# Patient Record
Sex: Male | Born: 1935 | Race: White | Hispanic: No | Marital: Married | Smoking: Former smoker
Health system: Southern US, Community
[De-identification: ages and names within clinical notes are randomized; demographics above are authoritative.]

## PROBLEM LIST (undated history)

## (undated) DIAGNOSIS — C801 Malignant (primary) neoplasm, unspecified: Secondary | ICD-10-CM

## (undated) DIAGNOSIS — D649 Anemia, unspecified: Secondary | ICD-10-CM

## (undated) DIAGNOSIS — A048 Other specified bacterial intestinal infections: Secondary | ICD-10-CM

## (undated) DIAGNOSIS — K922 Gastrointestinal hemorrhage, unspecified: Secondary | ICD-10-CM

## (undated) DIAGNOSIS — I10 Essential (primary) hypertension: Secondary | ICD-10-CM

## (undated) DIAGNOSIS — S065X9A Traumatic subdural hemorrhage with loss of consciousness of unspecified duration, initial encounter: Secondary | ICD-10-CM

## (undated) DIAGNOSIS — I639 Cerebral infarction, unspecified: Secondary | ICD-10-CM

## (undated) DIAGNOSIS — I629 Nontraumatic intracranial hemorrhage, unspecified: Secondary | ICD-10-CM

## (undated) DIAGNOSIS — S065XAA Traumatic subdural hemorrhage with loss of consciousness status unknown, initial encounter: Secondary | ICD-10-CM

## (undated) DIAGNOSIS — E119 Type 2 diabetes mellitus without complications: Secondary | ICD-10-CM

## (undated) DIAGNOSIS — I482 Chronic atrial fibrillation, unspecified: Secondary | ICD-10-CM

## (undated) DIAGNOSIS — Z9889 Other specified postprocedural states: Secondary | ICD-10-CM

## (undated) DIAGNOSIS — C61 Malignant neoplasm of prostate: Secondary | ICD-10-CM

## (undated) DIAGNOSIS — I2510 Atherosclerotic heart disease of native coronary artery without angina pectoris: Secondary | ICD-10-CM

## (undated) DIAGNOSIS — G250 Essential tremor: Secondary | ICD-10-CM

## (undated) DIAGNOSIS — I4510 Unspecified right bundle-branch block: Secondary | ICD-10-CM

## (undated) HISTORY — DX: Cerebral infarction, unspecified: I63.9

## (undated) HISTORY — PX: CORONARY ARTERY BYPASS GRAFT: SHX141

## (undated) HISTORY — PX: OTHER SURGICAL HISTORY: SHX169

---

## 2005-09-14 ENCOUNTER — Ambulatory Visit: Payer: Self-pay | Admitting: Ophthalmology

## 2005-09-19 ENCOUNTER — Ambulatory Visit: Payer: Self-pay | Admitting: Ophthalmology

## 2006-06-03 ENCOUNTER — Ambulatory Visit: Payer: Self-pay | Admitting: Ophthalmology

## 2006-06-10 ENCOUNTER — Ambulatory Visit: Payer: Self-pay | Admitting: Ophthalmology

## 2009-06-22 ENCOUNTER — Ambulatory Visit: Payer: Self-pay | Admitting: Cardiovascular Disease

## 2009-06-22 ENCOUNTER — Ambulatory Visit: Payer: Self-pay | Admitting: Ophthalmology

## 2009-07-04 ENCOUNTER — Ambulatory Visit: Payer: Self-pay | Admitting: Ophthalmology

## 2011-06-08 ENCOUNTER — Other Ambulatory Visit: Payer: Self-pay | Admitting: Neurosurgery

## 2011-06-08 DIAGNOSIS — S0190XA Unspecified open wound of unspecified part of head, initial encounter: Secondary | ICD-10-CM

## 2011-06-13 ENCOUNTER — Ambulatory Visit
Admission: RE | Admit: 2011-06-13 | Discharge: 2011-06-13 | Disposition: A | Discharge status: Home / Self Care | Payer: Medicare Other | Source: Ambulatory Visit | Attending: Neurosurgery | Admitting: Neurosurgery

## 2011-06-13 DIAGNOSIS — S0190XA Unspecified open wound of unspecified part of head, initial encounter: Secondary | ICD-10-CM

## 2013-12-07 ENCOUNTER — Emergency Department: Payer: Self-pay | Admitting: Emergency Medicine

## 2014-12-22 ENCOUNTER — Emergency Department: Payer: Self-pay | Admitting: Emergency Medicine

## 2014-12-22 LAB — CBC WITH DIFFERENTIAL/PLATELET
Basophil #: 0.1 10*3/uL (ref 0.0–0.1)
Basophil %: 0.5 %
EOS PCT: 0.7 %
Eosinophil #: 0.1 10*3/uL (ref 0.0–0.7)
HCT: 46.1 % (ref 40.0–52.0)
HGB: 15.2 g/dL (ref 13.0–18.0)
LYMPHS ABS: 1.6 10*3/uL (ref 1.0–3.6)
LYMPHS PCT: 14 %
MCH: 31.8 pg (ref 26.0–34.0)
MCHC: 33 g/dL (ref 32.0–36.0)
MCV: 96 fL (ref 80–100)
Monocyte #: 1.1 x10 3/mm — ABNORMAL HIGH (ref 0.2–1.0)
Monocyte %: 9.4 %
NEUTROS PCT: 75.4 %
Neutrophil #: 8.7 10*3/uL — ABNORMAL HIGH (ref 1.4–6.5)
Platelet: 194 10*3/uL (ref 150–440)
RBC: 4.78 10*6/uL (ref 4.40–5.90)
RDW: 14 % (ref 11.5–14.5)
WBC: 11.6 10*3/uL — ABNORMAL HIGH (ref 3.8–10.6)

## 2014-12-22 LAB — URINALYSIS, COMPLETE
BACTERIA: NONE SEEN
Bilirubin,UR: NEGATIVE
Blood: NEGATIVE
Glucose,UR: NEGATIVE mg/dL (ref 0–75)
Ketone: NEGATIVE
LEUKOCYTE ESTERASE: NEGATIVE
Nitrite: NEGATIVE
PROTEIN: NEGATIVE
Ph: 5 (ref 4.5–8.0)
RBC,UR: 1 /HPF (ref 0–5)
SPECIFIC GRAVITY: 1.017 (ref 1.003–1.030)
Squamous Epithelial: <1
WBC UR: 2 /HPF (ref 0–5)

## 2014-12-22 LAB — BASIC METABOLIC PANEL
Anion Gap: 7 (ref 7–16)
BUN: 20 mg/dL — ABNORMAL HIGH (ref 7–18)
CO2: 27 mmol/L (ref 21–32)
Calcium, Total: 8.8 mg/dL (ref 8.5–10.1)
Chloride: 108 mmol/L — ABNORMAL HIGH (ref 98–107)
Creatinine: 1.13 mg/dL (ref 0.60–1.30)
Glucose: 119 mg/dL — ABNORMAL HIGH (ref 65–99)
Osmolality: 287 (ref 275–301)
Potassium: 4.3 mmol/L (ref 3.5–5.1)
SODIUM: 142 mmol/L (ref 136–145)

## 2014-12-22 LAB — TROPONIN I: TROPONIN-I: 0.02 ng/mL

## 2015-01-13 ENCOUNTER — Ambulatory Visit: Payer: Self-pay | Admitting: Neurology

## 2015-07-09 ENCOUNTER — Ambulatory Visit: Admission: RE | Admit: 2015-07-09 | Payer: Self-pay | Source: Ambulatory Visit

## 2015-07-09 ENCOUNTER — Emergency Department
Admission: EM | Admit: 2015-07-09 | Discharge: 2015-07-09 | Discharge status: Against Medical Advice | Payer: Medicare Other | Attending: Emergency Medicine | Admitting: Emergency Medicine

## 2015-07-09 ENCOUNTER — Encounter: Payer: Self-pay | Admitting: Emergency Medicine

## 2015-07-09 DIAGNOSIS — W1839XA Other fall on same level, initial encounter: Secondary | ICD-10-CM | POA: Diagnosis not present

## 2015-07-09 DIAGNOSIS — S59901A Unspecified injury of right elbow, initial encounter: Secondary | ICD-10-CM | POA: Diagnosis not present

## 2015-07-09 DIAGNOSIS — Y92009 Unspecified place in unspecified non-institutional (private) residence as the place of occurrence of the external cause: Secondary | ICD-10-CM | POA: Insufficient documentation

## 2015-07-09 DIAGNOSIS — Y9389 Activity, other specified: Secondary | ICD-10-CM | POA: Insufficient documentation

## 2015-07-09 DIAGNOSIS — Y998 Other external cause status: Secondary | ICD-10-CM | POA: Insufficient documentation

## 2015-07-09 DIAGNOSIS — I10 Essential (primary) hypertension: Secondary | ICD-10-CM | POA: Insufficient documentation

## 2015-07-09 DIAGNOSIS — Z87891 Personal history of nicotine dependence: Secondary | ICD-10-CM | POA: Insufficient documentation

## 2015-07-09 HISTORY — DX: Essential (primary) hypertension: I10

## 2015-07-09 HISTORY — DX: Type 2 diabetes mellitus without complications: E11.9

## 2015-07-09 HISTORY — DX: Essential tremor: G25.0

## 2015-07-09 HISTORY — DX: Nontraumatic intracranial hemorrhage, unspecified: I62.9

## 2015-07-09 NOTE — ED Notes (Signed)
Caregiver is with patient now. States patient has follow up with neurologist on Monday and opts to leave now and see that MD on Monday.

## 2015-07-09 NOTE — ED Notes (Signed)
Patient states he feels like his "feet get stuck"

## 2015-07-09 NOTE — ED Notes (Signed)
States has history of tremor and unsteady gait x 6 months, 4 falls in past 2 weeks

## 2015-07-13 ENCOUNTER — Other Ambulatory Visit: Payer: Self-pay | Admitting: Neurology

## 2015-07-13 DIAGNOSIS — W19XXXA Unspecified fall, initial encounter: Secondary | ICD-10-CM

## 2015-07-19 ENCOUNTER — Ambulatory Visit
Admission: RE | Admit: 2015-07-19 | Discharge: 2015-07-19 | Disposition: A | Discharge status: Home / Self Care | Payer: Medicare Other | Source: Ambulatory Visit | Attending: Neurology | Admitting: Neurology

## 2015-07-19 DIAGNOSIS — G319 Degenerative disease of nervous system, unspecified: Secondary | ICD-10-CM | POA: Insufficient documentation

## 2015-07-19 DIAGNOSIS — S0990XA Unspecified injury of head, initial encounter: Secondary | ICD-10-CM | POA: Insufficient documentation

## 2015-07-19 DIAGNOSIS — G9389 Other specified disorders of brain: Secondary | ICD-10-CM | POA: Diagnosis not present

## 2015-07-19 DIAGNOSIS — R296 Repeated falls: Secondary | ICD-10-CM | POA: Diagnosis present

## 2015-07-19 DIAGNOSIS — W19XXXA Unspecified fall, initial encounter: Secondary | ICD-10-CM

## 2015-08-15 ENCOUNTER — Other Ambulatory Visit: Payer: Self-pay | Admitting: Neurology

## 2015-08-15 DIAGNOSIS — R1310 Dysphagia, unspecified: Secondary | ICD-10-CM

## 2015-08-29 ENCOUNTER — Ambulatory Visit
Admission: RE | Admit: 2015-08-29 | Discharge: 2015-08-29 | Disposition: A | Discharge status: Home / Self Care | Payer: Medicare Other | Source: Ambulatory Visit | Attending: Neurology | Admitting: Neurology

## 2015-08-29 DIAGNOSIS — R1310 Dysphagia, unspecified: Secondary | ICD-10-CM | POA: Diagnosis present

## 2015-08-29 DIAGNOSIS — E119 Type 2 diabetes mellitus without complications: Secondary | ICD-10-CM | POA: Diagnosis not present

## 2015-08-29 DIAGNOSIS — I10 Essential (primary) hypertension: Secondary | ICD-10-CM | POA: Insufficient documentation

## 2015-08-29 NOTE — Therapy (Signed)
[place], [place], [address] Phone: [phone]   Fax:     Modified Barium Swallow  Patient Details  Name: Robert Keith MRN: [id number] Date of Birth: Jul 22, 1933 Referring Provider:  Vladimir Crofts, MD  Encounter Date: 08/29/2015      End of Session - 08/29/15 1335    Visit Number 1   Number of Visits 1   Date for SLP Re-Evaluation 08/29/15   SLP Start Time 1245   SLP Stop Time  1335   SLP Time Calculation (min) 50 min   Activity Tolerance Patient tolerated treatment well      Past Medical History  Diagnosis Date  . Essential tremor   . Intracranial hemorrhage   . Diabetes mellitus without complication   . Hypertension     Past Surgical History  Procedure Laterality Date  . Brain surgery for bleed      There were no vitals filed for this visit.  Visit Diagnosis: Dysphagia - Plan: DG OP Swallowing Func-Medicare/Speech Path, DG OP Swallowing Func-Medicare/Speech Path     Subjective: Patient behavior: Patient is alert and able to follow directions for this evaluation.  Chief complaint:  The patient reports that "sometimes my throat just closes" and occasional episodes of liquid "going the wrong way".   Objective:  Radiological Procedure: A videoflouroscopic evaluation of oral-preparatory, reflex initiation, and pharyngeal phases of the swallow was performed; as well as a screening of the upper esophageal phase.  I. POSTURE: Upright in MBS chair  II. VIEW: Lateral  III. COMPENSATORY STRATEGIES: N/A  IV. BOLUSES ADMINISTERED:   Thin Liquid: 2 sips, 3 rapid multiple sips   Nectar-thick Liquid: 1 sip    Puree: 2 teaspoons   Mechanical Soft:1/4 graham cracker in applesauce  V. RESULTS OF EVALUATION: A. ORAL PREPARATORY PHASE: (The lips, tongue, and velum are observed for strength and coordination)  Overall within functional limits; observe some xerostomia with barium  adhering to dry mucosal surfaces.       **Overall Severity Rating: WFL  B. SWALLOW INITIATION/REFLEX: (The reflex is normal if "triggered" by the time the bolus reached the base of the tongue) Triggers at the vallecular spaces.  **Overall Severity Rating: Minimal   C. PHARYNGEAL PHASE: (Pharyngeal function is normal if the bolus shows rapid, smooth, and continuous transit through the pharynx and there is no pharyngeal residue after the swallow) Within normal limits  **Overall Severity Rating:WNL  D. LARYNGEAL PENETRATION: (Material entering into the laryngeal inlet/vestibule but not aspirated) None  E. ASPIRATION: None  F. ESOPHAGEAL PHASE: (Screening of the upper esophagus) No observed abnormalities through the cervical esophagus.   ASSESSMENT: This 81 year old man; with essential tremor (possibly PSP) and swallowing difficultly described as his throat closing up and occasional choking if he does not attend to swallowing liquids; is presenting with minimal oropharyngeal dysphagia.  Oral control of the bolus including oral hold, rotary mastication, and anterior to posterior transfer is within normal limits.  Timing of the pharyngeal swallow is minimally delayed.  Pharyngeal aspects of the swallow including hyolaryngeal excursion, pharyngeal pressure generation, duration/amplitude of UES opening, and laryngeal vestibule closure at the height of the swallow are within normal limits.  There is no observed laryngeal penetration or tracheal aspiration.  The patient is not at risk for prandial aspiration.  The "throat closing" episodes sound as if he may be experiencing laryngospasms.  Recommend referral to ENT to fully evaluate vocal fold function and  assess for laryngopharyngeal reflux.    PLAN/RECOMMENDATIONS:   A. Diet: Regular   B. Swallowing Precautions: Reflux precautions   C. Recommended consultation to ENT   D. Therapy recommendations N/A   E. Results and recommendations were discussed  with the patient immediately following the study                         and the final report will be faxed to referring MD.             G-Codes - 2015/09/23 1336    Functional Assessment Tool Used MBS   Functional Limitations Swallowing   Swallow Current Status (X6147) At least 1 percent but less than 20 percent impaired, limited or restricted   Swallow Goal Status (W9295) At least 1 percent but less than 20 percent impaired, limited or restricted   Swallow Discharge Status [id number]) At least 1 percent but less than 20 percent impaired, limited or restricted          Problem List There are no active problems to display for this patient.  Leroy Sea, MS/CCC- SLP  Lou Miner 09-23-15, 1:37 PM  [facility] [place], [place], [address] Phone: [phone]   Fax:

## 2016-03-30 ENCOUNTER — Emergency Department
Admission: EM | Admit: 2016-03-30 | Discharge: 2016-03-30 | Disposition: A | Discharge status: Home / Self Care | Payer: Medicare Other | Attending: Emergency Medicine | Admitting: Emergency Medicine

## 2016-03-30 ENCOUNTER — Encounter: Payer: Self-pay | Admitting: Emergency Medicine

## 2016-03-30 DIAGNOSIS — I629 Nontraumatic intracranial hemorrhage, unspecified: Secondary | ICD-10-CM | POA: Insufficient documentation

## 2016-03-30 DIAGNOSIS — E119 Type 2 diabetes mellitus without complications: Secondary | ICD-10-CM | POA: Diagnosis not present

## 2016-03-30 DIAGNOSIS — L089 Local infection of the skin and subcutaneous tissue, unspecified: Secondary | ICD-10-CM | POA: Insufficient documentation

## 2016-03-30 DIAGNOSIS — I10 Essential (primary) hypertension: Secondary | ICD-10-CM | POA: Insufficient documentation

## 2016-03-30 DIAGNOSIS — Z87891 Personal history of nicotine dependence: Secondary | ICD-10-CM | POA: Diagnosis not present

## 2016-03-30 DIAGNOSIS — B958 Unspecified staphylococcus as the cause of diseases classified elsewhere: Secondary | ICD-10-CM

## 2016-03-30 DIAGNOSIS — L98499 Non-pressure chronic ulcer of skin of other sites with unspecified severity: Secondary | ICD-10-CM | POA: Diagnosis present

## 2016-03-30 MED ORDER — CEPHALEXIN 500 MG PO CAPS: 500.0000 mg | ORAL_CAPSULE | Freq: Two times a day (BID) | ORAL | Status: AC

## 2016-03-30 NOTE — ED Notes (Signed)
Redness and drainage from umbilical area.  Patient describes a crust around umbilicus x 2 days.  Inner aspect of umbilical area red with small amount of yellow drainage seen.

## 2016-03-30 NOTE — ED Provider Notes (Signed)
CSN: [id number]     Arrival date & time 03/30/16  1826 History   First MD Initiated Contact with Patient 03/30/16 1857     Chief Complaint  Patient presents with  . Skin Ulcer     HPI  82 year old male who presents to the emergency department for evaluation of a crusting in discharge from his umbilicus for the past 2 days. He states that the inner aspect of the MP local area has been red for a few days, but the drainage started 2 days ago. He denies pain. He states that his caretaker has cleaned it out well, but otherwise he has had no treatment.  Past Medical History  Diagnosis Date  . Essential tremor   . Intracranial hemorrhage ([place])   . Diabetes mellitus without complication ([place])   . Hypertension    Past Surgical History  Procedure Laterality Date  . Brain surgery for bleed     No family history on file. Social History  Substance Use Topics  . Smoking status: Former Research scientist (life sciences)  . Smokeless tobacco: None  . Alcohol Use: No    Review of Systems  Constitutional: Negative for fever and chills.  Respiratory: Negative.   Gastrointestinal: Negative for nausea, vomiting and abdominal pain.  Skin: Positive for color change.      Allergies  Phenobarbital  Home Medications   Prior to Admission medications   Medication Sig Start Date End Date Taking? Authorizing Provider  cephALEXin (KEFLEX) 500 MG capsule Take 1 capsule (500 mg total) by mouth 2 (two) times daily. 03/30/16 04/09/16  Shatera Rennert B Jaileen Janelle, FNP   BP 130/62 mmHg  Pulse 65  Temp(Src) 98.1 F (36.7 C) (Oral)  Ht 6' (1.829 m)  Wt 83.915 kg  BMI 25.08 kg/m2  SpO2 97% Physical Exam  Constitutional: He is oriented to person, place, and time. He appears well-developed and well-nourished.  Eyes: EOM are normal.  Neck: Normal range of motion.  Pulmonary/Chest: Effort normal.  Abdominal: Soft. He exhibits no distension and no mass. There is no tenderness.  Musculoskeletal: Normal range of motion.  Neurological: He is  alert and oriented to person, place, and time.  Skin: Skin is warm. There is erythema.     Psychiatric: He has a normal mood and affect. His behavior is normal. Judgment and thought content normal.  Nursing note and vitals reviewed.   ED Course  Procedures (including critical care time) Labs Review Labs Reviewed - No data to display  Imaging Review No results found. I have personally reviewed and evaluated these images and lab results as part of my medical decision-making.   EKG Interpretation None      MDM   Final diagnoses:  Staph skin infection    Patient will be started on Keflex twice per day for the next 10 days. He was instructed to schedule a follow-up appointment with his primary care provider if the symptoms are not improving over the next 3 days. He was advised to continue wound care and cleansing the umbilicus twice per day. He was advised to return to the emergency department for symptoms that change or worsen if he is unable to schedule an appointment with his primary care provider.    Victorino Dike, FNP 03/30/16 [place], MD 04/03/16 [phone]

## 2016-09-16 ENCOUNTER — Emergency Department: Payer: Medicare Other

## 2016-09-16 ENCOUNTER — Other Ambulatory Visit: Payer: Self-pay

## 2016-09-16 ENCOUNTER — Encounter: Payer: Self-pay | Admitting: Emergency Medicine

## 2016-09-16 ENCOUNTER — Emergency Department
Admission: EM | Admit: 2016-09-16 | Discharge: 2016-09-16 | Payer: Medicare Other | Attending: Student in an Organized Health Care Education/Training Program | Admitting: Student in an Organized Health Care Education/Training Program

## 2016-09-16 DIAGNOSIS — E119 Type 2 diabetes mellitus without complications: Secondary | ICD-10-CM | POA: Diagnosis not present

## 2016-09-16 DIAGNOSIS — K254 Chronic or unspecified gastric ulcer with hemorrhage: Secondary | ICD-10-CM

## 2016-09-16 DIAGNOSIS — I10 Essential (primary) hypertension: Secondary | ICD-10-CM | POA: Diagnosis not present

## 2016-09-16 DIAGNOSIS — Z87891 Personal history of nicotine dependence: Secondary | ICD-10-CM | POA: Diagnosis not present

## 2016-09-16 DIAGNOSIS — K922 Gastrointestinal hemorrhage, unspecified: Secondary | ICD-10-CM | POA: Diagnosis not present

## 2016-09-16 DIAGNOSIS — R1013 Epigastric pain: Secondary | ICD-10-CM

## 2016-09-16 HISTORY — DX: Gastrointestinal hemorrhage, unspecified: K92.2

## 2016-09-16 HISTORY — DX: Malignant (primary) neoplasm, unspecified: C80.1

## 2016-09-16 HISTORY — DX: Other specified bacterial intestinal infections: A04.8

## 2016-09-16 LAB — CBC
HCT: 32.2 % — ABNORMAL LOW (ref 40.0–52.0)
Hemoglobin: 10.9 g/dL — ABNORMAL LOW (ref 13.0–18.0)
MCH: 32.2 pg (ref 26.0–34.0)
MCHC: 33.7 g/dL (ref 32.0–36.0)
MCV: 95.5 fL (ref 80.0–100.0)
PLATELETS: 217 10*3/uL (ref 150–440)
RBC: 3.38 MIL/uL — AB (ref 4.40–5.90)
RDW: 13.3 % (ref 11.5–14.5)
WBC: 7.7 10*3/uL (ref 3.8–10.6)

## 2016-09-16 LAB — COMPREHENSIVE METABOLIC PANEL
ALBUMIN: 4.2 g/dL (ref 3.5–5.0)
ALK PHOS: 89 U/L (ref 38–126)
ALT: 46 U/L (ref 17–63)
AST: 52 U/L — AB (ref 15–41)
Anion gap: 7 (ref 5–15)
BUN: 14 mg/dL (ref 6–20)
CALCIUM: 8.6 mg/dL — AB (ref 8.9–10.3)
CHLORIDE: 107 mmol/L (ref 101–111)
CO2: 26 mmol/L (ref 22–32)
CREATININE: 0.76 mg/dL (ref 0.61–1.24)
GFR calc non Af Amer: >60 mL/min (ref >60)
GLUCOSE: 172 mg/dL — AB (ref 65–99)
Potassium: 3.7 mmol/L (ref 3.5–5.1)
SODIUM: 140 mmol/L (ref 135–145)
Total Bilirubin: 0.5 mg/dL (ref 0.3–1.2)
Total Protein: 7.3 g/dL (ref 6.5–8.1)

## 2016-09-16 LAB — LIPASE, BLOOD: LIPASE: 23 U/L (ref 11–51)

## 2016-09-16 MED ORDER — PANTOPRAZOLE SODIUM 40 MG IV SOLR
80.0000 mg | Freq: Once | INTRAVENOUS | Status: AC
  Administered 2016-09-16: 80 mg via INTRAVENOUS
  Filled 2016-09-16: qty 80

## 2016-09-16 MED ORDER — GI COCKTAIL ~~LOC~~
30.0000 mL | Freq: Once | ORAL | Status: AC
  Administered 2016-09-16: 30 mL via ORAL
  Filled 2016-09-16: qty 30

## 2016-09-16 MED ORDER — IOPAMIDOL (ISOVUE-300) INJECTION 61%
100.0000 mL | Freq: Once | INTRAVENOUS | Status: AC | PRN
  Administered 2016-09-16: 100 mL via INTRAVENOUS

## 2016-09-16 MED ORDER — IOPAMIDOL (ISOVUE-300) INJECTION 61%
30.0000 mL | Freq: Once | INTRAVENOUS | Status: AC | PRN
  Administered 2016-09-16: 30 mL via ORAL

## 2016-09-16 MED ORDER — METOCLOPRAMIDE HCL 5 MG/ML IJ SOLN
10.0000 mg | Freq: Once | INTRAMUSCULAR | Status: AC
  Administered 2016-09-16: 10 mg via INTRAVENOUS
  Filled 2016-09-16: qty 2

## 2016-09-16 NOTE — ED Triage Notes (Addendum)
Pt presents to ED with mid abdominal pain associated with nausea and diarrhea. Pt states he was constipated then broke into a diarrhea. Pt also reports dark stool but states he started having dark stool he began to take pepto-bismol. Pt also reports something feels like hemorrhoids. Pt alerts and oriented x3 at time. Hx of H. Pylori.

## 2016-09-16 NOTE — Progress Notes (Addendum)
Transfer from [facility]. Robert Keith is a 82 year old male with pmh significant for HTN, DM, PUD, and H. Pylori; who presents with 2 wk hx of epigastric pain. Intermittent Motrin use. Tried Pepto-Bismol without relief. VS stable. Hbg 10.9( last Hbg of note 14.1 in 11/2015) and all other labs relatively unremarkable. Guaiac positive stools. CT abdomen pelvis pending. Given Protonix 80 mg IV, Reglan, GI cocktail, and IV fluids Transferring for need of GI consultative services. Will need to consult GI either upon arrival or in am. Observation status to a telemetry bed.

## 2016-09-16 NOTE — ED Notes (Signed)
In with MD to chaperone rectal exam; pt hemoccult positive; pt tolerated well; repositioned for comfort;

## 2016-09-16 NOTE — ED Provider Notes (Signed)
[facility] Emergency Department Provider Note    First MD Initiated Contact with Patient 09/16/16 2001     (approximate)  I have reviewed the triage vital signs and the nursing notes.   HISTORY  Chief Complaint Abdominal Pain    HPI [name] is a 82 y.o. male 2 weeks of worsening epigastric pain and nausea and dark stools. Patient has been taking Pepto-Bismol but says that the melena has become more foul-smelling. States that the epigastric pain has been worse over the past 24 hours. The patient does have a history of peptic ulcer disease secondary to H. pylori. Denies any recent fevers. Does report shortness of breath and fatigue. Denies any dysuria.   Past Medical History:  Diagnosis Date  . Diabetes mellitus without complication ([place])   . Essential tremor   . Hypertension   . Intracranial hemorrhage ([place])     There are no active problems to display for this patient.   Past Surgical History:  Procedure Laterality Date  . brain surgery for bleed      Prior to Admission medications   Not on File    Allergies Phenobarbital  History reviewed. No pertinent family history.  Social History Social History  Substance Use Topics  . Smoking status: Former Research scientist (life sciences)  . Smokeless tobacco: Never Used  . Alcohol use No    Review of Systems Patient denies headaches, rhinorrhea, blurry vision, numbness, shortness of breath, chest pain, edema, cough, abdominal pain, nausea, vomiting, diarrhea, dysuria, fevers, rashes or hallucinations unless otherwise stated above in HPI. ____________________________________________   PHYSICAL EXAM:  VITAL SIGNS: Vitals:   09/16/16 1916  BP: 131/71  Pulse: 63  Resp: 16  Temp: 98.3 F (36.8 C)    Constitutional: Alert and oriented. Ill appearing but not in no acute distress  Eyes: Conjunctivae are normal. PERRL. EOMI. Head: Atraumatic. Nose: No congestion/rhinnorhea. Mouth/Throat: Mucous membranes  are moist.  Oropharynx non-erythematous. Neck: No stridor. Painless ROM. No cervical spine tenderness to palpation Hematological/Lymphatic/Immunilogical: No cervical lymphadenopathy. Cardiovascular: Normal rate, regular rhythm. Grossly normal heart sounds.  Good peripheral circulation. Respiratory: Normal respiratory effort.  No retractions. Lungs CTAB. Gastrointestinal: Soft with mild epigastric tenderness to palpation. No distention. No abdominal bruits. No CVA tenderness. Rectal exam with nontender hemorrhoids and grossly positive guaiac study. Genitourinary:  Musculoskeletal: No lower extremity tenderness nor edema.  No joint effusions. Neurologic:  Normal speech and language. No gross focal neurologic deficits are appreciated. No gait instability. Skin:  Skin is warm, dry and intact. No rash noted. Psychiatric: Mood and affect are normal. Speech and behavior are normal.  ____________________________________________   LABS (all labs ordered are listed, but only abnormal results are displayed)  Results for orders placed or performed during the hospital encounter of 09/16/16 (from the past 24 hour(s))  Lipase, blood     Status: None   Collection Time: 09/16/16  7:27 PM  Result Value Ref Range   Lipase 23 11 - 51 U/L  Comprehensive metabolic panel     Status: Abnormal   Collection Time: 09/16/16  7:27 PM  Result Value Ref Range   Sodium 140 135 - 145 mmol/L   Potassium 3.7 3.5 - 5.1 mmol/L   Chloride 107 101 - 111 mmol/L   CO2 26 22 - 32 mmol/L   Glucose, Bld 172 (H) 65 - 99 mg/dL   BUN 14 6 - 20 mg/dL   Creatinine, Ser 0.76 0.61 - 1.24 mg/dL   Calcium 8.6 (L) 8.9 -  10.3 mg/dL   Total Protein 7.3 6.5 - 8.1 g/dL   Albumin 4.2 3.5 - 5.0 g/dL   AST 52 (H) 15 - 41 U/L   ALT 46 17 - 63 U/L   Alkaline Phosphatase 89 38 - 126 U/L   Total Bilirubin 0.5 0.3 - 1.2 mg/dL   GFR calc non Af Amer >60 >60 mL/min   GFR calc Af Amer >60 >60 mL/min   Anion gap 7 5 - 15  CBC     Status:  Abnormal   Collection Time: 09/16/16  7:27 PM  Result Value Ref Range   WBC 7.7 3.8 - 10.6 K/uL   RBC 3.38 (L) 4.40 - 5.90 MIL/uL   Hemoglobin 10.9 (L) 13.0 - 18.0 g/dL   HCT 32.2 (L) 40.0 - 52.0 %   MCV 95.5 80.0 - 100.0 fL   MCH 32.2 26.0 - 34.0 pg   MCHC 33.7 32.0 - 36.0 g/dL   RDW 13.3 11.5 - 14.5 %   Platelets 217 150 - 440 K/uL   ____________________________________________  EKG My review and personal interpretation at Time: 20:50   Indication: weakness  Rate: 80  Rhythm: afib Axis:  Other: no acute ischemia ____________________________________________  RADIOLOGY I personally reviewed all radiographic images ordered to evaluate for the above acute complaints and reviewed radiology reports and findings.  These findings were personally discussed with the patient.  Please see medical record for radiology report.  ____________________________________________   PROCEDURES  Procedure(s) performed: none    Critical Care performed: no ____________________________________________   INITIAL IMPRESSION / ASSESSMENT AND PLAN / ED COURSE  Pertinent labs & imaging results that were available during my care of the patient were reviewed by me and considered in my medical decision making (see chart for details).  DDX: Obstruction, GI bleed, gastritis  [name] is a 82 y.o. who presents to the ED with 2 weeks of epigastric pain as well as darkening stools. Patient with history of peptic ulcer disease. Abdominal exam does have some epigastric tenderness. CT imaging ordered to evaluate for acute obstruction shows none. Patient with grossly positive stool guaiac. Based on his symptoms or do feel presentation concerning for upper GI bleed. Hemoglobin relatively stable therefore I will not transfuse at this time. Discussed need for transfer to [facility] for GI consult. I spoke with Dr. Tamala Julian who agrees to admit patient to a monitored bed for further evaluation and management.  Have  discussed with the patient and available family all diagnostics and treatments performed thus far and all questions were answered to the best of my ability. The patient demonstrates understanding and agreement with plan.   Clinical Course  Comment By Time  Patient were grossly positive guaiac. Merlyn Lot, MD 10/01 2131     ____________________________________________   FINAL CLINICAL IMPRESSION(S) / ED DIAGNOSES  Final diagnoses:  Epigastric abdominal pain  Gastrointestinal hemorrhage associated with gastric ulcer      NEW MEDICATIONS STARTED DURING THIS VISIT:  New Prescriptions   No medications on file     Note:  This document was prepared using Dragon voice recognition software and may include unintentional dictation errors.    Merlyn Lot, MD 09/16/16 2201

## 2016-09-17 ENCOUNTER — Inpatient Hospital Stay (HOSPITAL_COMMUNITY)
Admission: AD | Admit: 2016-09-17 | Discharge: 2016-09-21 | DRG: 378 | Disposition: A | Discharge status: Home / Self Care | Payer: Medicare Other | Source: Other Acute Inpatient Hospital | Attending: Internal Medicine | Admitting: Internal Medicine

## 2016-09-17 ENCOUNTER — Encounter (HOSPITAL_COMMUNITY): Payer: Self-pay

## 2016-09-17 DIAGNOSIS — Z8679 Personal history of other diseases of the circulatory system: Secondary | ICD-10-CM | POA: Diagnosis not present

## 2016-09-17 DIAGNOSIS — K921 Melena: Secondary | ICD-10-CM | POA: Diagnosis not present

## 2016-09-17 DIAGNOSIS — Z8249 Family history of ischemic heart disease and other diseases of the circulatory system: Secondary | ICD-10-CM | POA: Diagnosis not present

## 2016-09-17 DIAGNOSIS — K269 Duodenal ulcer, unspecified as acute or chronic, without hemorrhage or perforation: Secondary | ICD-10-CM | POA: Diagnosis not present

## 2016-09-17 DIAGNOSIS — Z7984 Long term (current) use of oral hypoglycemic drugs: Secondary | ICD-10-CM | POA: Diagnosis not present

## 2016-09-17 DIAGNOSIS — E785 Hyperlipidemia, unspecified: Secondary | ICD-10-CM | POA: Diagnosis not present

## 2016-09-17 DIAGNOSIS — D62 Acute posthemorrhagic anemia: Secondary | ICD-10-CM | POA: Diagnosis present

## 2016-09-17 DIAGNOSIS — Z951 Presence of aortocoronary bypass graft: Secondary | ICD-10-CM | POA: Diagnosis not present

## 2016-09-17 DIAGNOSIS — K315 Obstruction of duodenum: Secondary | ICD-10-CM | POA: Diagnosis present

## 2016-09-17 DIAGNOSIS — I2583 Coronary atherosclerosis due to lipid rich plaque: Secondary | ICD-10-CM

## 2016-09-17 DIAGNOSIS — Z87891 Personal history of nicotine dependence: Secondary | ICD-10-CM | POA: Diagnosis not present

## 2016-09-17 DIAGNOSIS — Z23 Encounter for immunization: Secondary | ICD-10-CM | POA: Diagnosis not present

## 2016-09-17 DIAGNOSIS — I4891 Unspecified atrial fibrillation: Secondary | ICD-10-CM | POA: Diagnosis present

## 2016-09-17 DIAGNOSIS — I10 Essential (primary) hypertension: Secondary | ICD-10-CM | POA: Diagnosis present

## 2016-09-17 DIAGNOSIS — Z823 Family history of stroke: Secondary | ICD-10-CM | POA: Diagnosis not present

## 2016-09-17 DIAGNOSIS — K279 Peptic ulcer, site unspecified, unspecified as acute or chronic, without hemorrhage or perforation: Secondary | ICD-10-CM | POA: Diagnosis not present

## 2016-09-17 DIAGNOSIS — E1140 Type 2 diabetes mellitus with diabetic neuropathy, unspecified: Secondary | ICD-10-CM | POA: Diagnosis present

## 2016-09-17 DIAGNOSIS — I2510 Atherosclerotic heart disease of native coronary artery without angina pectoris: Secondary | ICD-10-CM | POA: Diagnosis present

## 2016-09-17 DIAGNOSIS — R1013 Epigastric pain: Secondary | ICD-10-CM | POA: Diagnosis present

## 2016-09-17 DIAGNOSIS — I482 Chronic atrial fibrillation, unspecified: Secondary | ICD-10-CM | POA: Diagnosis present

## 2016-09-17 HISTORY — DX: Atherosclerotic heart disease of native coronary artery without angina pectoris: I25.10

## 2016-09-17 LAB — CBC
HCT: 30.2 % — ABNORMAL LOW (ref 39.0–52.0)
Hemoglobin: 9.7 g/dL — ABNORMAL LOW (ref 13.0–17.0)
MCH: 31.5 pg (ref 26.0–34.0)
MCHC: 32.1 g/dL (ref 30.0–36.0)
MCV: 98.1 fL (ref 78.0–100.0)
PLATELETS: 217 10*3/uL (ref 150–400)
RBC: 3.08 MIL/uL — ABNORMAL LOW (ref 4.22–5.81)
RDW: 13.7 % (ref 11.5–15.5)
WBC: 6.5 10*3/uL (ref 4.0–10.5)

## 2016-09-17 LAB — TYPE AND SCREEN
ABO/RH(D): A POS
ANTIBODY SCREEN: NEGATIVE

## 2016-09-17 LAB — MAGNESIUM: MAGNESIUM: 2.2 mg/dL (ref 1.7–2.4)

## 2016-09-17 LAB — GLUCOSE, CAPILLARY
GLUCOSE-CAPILLARY: 151 mg/dL — AB (ref 65–99)
Glucose-Capillary: 113 mg/dL — ABNORMAL HIGH (ref 65–99)
Glucose-Capillary: 129 mg/dL — ABNORMAL HIGH (ref 65–99)
Glucose-Capillary: 98 mg/dL (ref 65–99)
Glucose-Capillary: 134 mg/dL — ABNORMAL HIGH (ref 65–99)
Glucose-Capillary: 150 mg/dL — ABNORMAL HIGH (ref 65–99)
Glucose-Capillary: 72 mg/dL (ref 65–99)

## 2016-09-17 LAB — TROPONIN I: Troponin I: <0.03 ng/mL (ref <0.03)

## 2016-09-17 LAB — TSH: TSH: 3.622 u[IU]/mL (ref 0.350–4.500)

## 2016-09-17 LAB — PROTIME-INR
INR: 1.21
Prothrombin Time: 15.4 seconds — ABNORMAL HIGH (ref 11.4–15.2)

## 2016-09-17 LAB — ABO/RH: ABO/RH(D): A POS

## 2016-09-17 MED ORDER — ONDANSETRON HCL 4 MG PO TABS
4.0000 mg | ORAL_TABLET | Freq: Four times a day (QID) | ORAL | Status: DC | PRN
  Filled 2016-09-17: qty 1

## 2016-09-17 MED ORDER — PRAVASTATIN SODIUM 40 MG PO TABS
40.0000 mg | ORAL_TABLET | Freq: Every day | ORAL | Status: DC
  Administered 2016-09-17 – 2016-09-20 (×4): 40 mg via ORAL
  Filled 2016-09-17 (×4): qty 1

## 2016-09-17 MED ORDER — INSULIN ASPART 100 UNIT/ML ~~LOC~~ SOLN
0.0000 [IU] | SUBCUTANEOUS | Status: DC
  Administered 2016-09-17 (×3): 2 [IU] via SUBCUTANEOUS
  Administered 2016-09-17: 3 [IU] via SUBCUTANEOUS
  Administered 2016-09-18 (×2): 2 [IU] via SUBCUTANEOUS
  Administered 2016-09-18 – 2016-09-19 (×3): 3 [IU] via SUBCUTANEOUS
  Administered 2016-09-19 (×2): 2 [IU] via SUBCUTANEOUS
  Administered 2016-09-19: 3 [IU] via SUBCUTANEOUS
  Administered 2016-09-20 (×2): 2 [IU] via SUBCUTANEOUS
  Administered 2016-09-20: 3 [IU] via SUBCUTANEOUS
  Administered 2016-09-20: 5 [IU] via SUBCUTANEOUS
  Administered 2016-09-21 (×2): 3 [IU] via SUBCUTANEOUS
  Administered 2016-09-21: 2 [IU] via SUBCUTANEOUS

## 2016-09-17 MED ORDER — ACETAMINOPHEN 325 MG PO TABS
650.0000 mg | ORAL_TABLET | Freq: Four times a day (QID) | ORAL | Status: DC | PRN
  Administered 2016-09-19 – 2016-09-20 (×2): 650 mg via ORAL
  Filled 2016-09-17 (×2): qty 2

## 2016-09-17 MED ORDER — ONDANSETRON HCL 4 MG/2ML IJ SOLN: 4.0000 mg | Freq: Four times a day (QID) | INTRAMUSCULAR | Status: DC | PRN

## 2016-09-17 MED ORDER — INFLUENZA VAC SPLIT QUAD 0.5 ML IM SUSY
0.5000 mL | PREFILLED_SYRINGE | INTRAMUSCULAR | Status: AC
  Administered 2016-09-18: 0.5 mL via INTRAMUSCULAR
  Filled 2016-09-17: qty 0.5

## 2016-09-17 MED ORDER — METOPROLOL TARTRATE 100 MG PO TABS
100.0000 mg | ORAL_TABLET | Freq: Two times a day (BID) | ORAL | Status: DC
  Administered 2016-09-17 – 2016-09-20 (×8): 100 mg via ORAL
  Filled 2016-09-17 (×6): qty 2
  Filled 2016-09-17: qty 1
  Filled 2016-09-17 (×2): qty 2

## 2016-09-17 MED ORDER — PANTOPRAZOLE SODIUM 40 MG IV SOLR
40.0000 mg | Freq: Two times a day (BID) | INTRAVENOUS | Status: DC
  Administered 2016-09-17 – 2016-09-18 (×3): 40 mg via INTRAVENOUS
  Filled 2016-09-17 (×3): qty 40

## 2016-09-17 MED ORDER — ACETAMINOPHEN 650 MG RE SUPP: 650.0000 mg | Freq: Four times a day (QID) | RECTAL | Status: DC | PRN

## 2016-09-17 MED ORDER — POTASSIUM CHLORIDE IN NACL 20-0.9 MEQ/L-% IV SOLN
INTRAVENOUS | Status: DC
  Administered 2016-09-17: 1000 mL via INTRAVENOUS
  Administered 2016-09-17 – 2016-09-18 (×4): via INTRAVENOUS
  Filled 2016-09-17 (×6): qty 1000

## 2016-09-17 NOTE — H&P (Signed)
History and Physical  Patient Name: Robert Keith     [id number]    DOB: [birth year]    DOA: 09/17/2016 PCP: Leonel Ramsay, MD   Patient coming from: Home  Chief Complaint: Epigastric pain  HPI: Robert Keith is a 82 y.o. male with a past medical history significant for PUD, NIDDM, HTN, CAD s/p CABG in 1996 and hx of SDH while on aspirin who presents with epigastric pain.  Over the last 10 days, the patient has had onset of slowly progressive epigastric discomfort.  This is not worse with meals or between meals, nor with position.  He is sedentary.  The pain is gnawing and located in the epigastrium, non-radiating, not associated with shortness of breath or palpitations, nor with hematemesis or hematochezia.  He has noted 4-5 "dark tarry" stools in the last week.  Has been taking "all the Pepto-Bismol you can take" without improvement.  Today, his daytime caregiver was concerned about the ongoing worsening pain and thought he looked ill-appearing, so brought him to the ER.  ED course: -Afebrile, heart rate 60s, BP 130/70, respirations normal  -Na 140, K 3.4, BUN normal, Cr 0.76 (baseline same), WBC 7.7K, Hgb 10.9 (was 15 when last measured in 2016) -Rectal exam is described in EDP note as "grossly positive stool guiac", which is unclear -The patient was given pantoprazole IV and TRH were asked to accept in transfer -Of note, the patient had an ECG with new rate-controlled AFib while in the ER (has no previous history of same)   The patient notes "many years" of H. pylori.  He does not use NSAIDs or alcohol.  He takes no blood thinners, because in 2011 he had a spontaneous SDH while taking aspirin that required evacuation.     ROS: Review of Systems  Respiratory: Negative for shortness of breath.   Gastrointestinal: Positive for abdominal pain (epigastric) and melena. Negative for blood in stool, heartburn, nausea and vomiting.  Neurological: Negative for dizziness and loss of  consciousness.  All other systems reviewed and are negative.         Past Medical History:  Diagnosis Date  . Cancer [facility])    prostate  . Coronary artery disease   . Diabetes mellitus without complication ([place])   . Essential tremor   . H. pylori infection   . Hypertension   . Intracranial hemorrhage [facility])     Past Surgical History:  Procedure Laterality Date  . brain surgery for bleed    . cardiac bypass    . CORONARY ARTERY BYPASS GRAFT      Social History: Patient lives by himself, has a daytime caregiver.  The patient walks with a walker.  He is a former smoker.  Worked in Geologist, engineering for [employer].    Allergies  Allergen Reactions  . Bupropion Other (See Comments)  . Fenofibrate Micronized     Other reaction(s): Other (See Comments) intolerance  . Gemfibrozil     Other reaction(s): Other (See Comments) intolerance  . Niacin     Other reaction(s): Other (See Comments) impotence  . Phenobarbital Other (See Comments)    confusion  . Simvastatin     Other reaction(s): Other (See Comments) Myalgias    Family history: family history includes Heart attack in his father; Stroke in his mother.  Prior to Admission medications   Medication Sig Start Date End Date Taking? Authorizing Provider  glipiZIDE (GLUCOTROL) 5 MG tablet Take 10 mg by mouth daily. 07/02/16  Yes Historical  Provider, MD  metFORMIN (GLUCOPHAGE) 1000 MG tablet Take 1,000 mg by mouth 2 (two) times daily. 10/05/15  Yes Historical Provider, MD  pravastatin (PRAVACHOL) 40 MG tablet Take 40 mg by mouth daily. 11/23/15  Yes Historical Provider, MD  famotidine (PEPCID) 40 MG tablet Take 40 mg by mouth at bedtime.    Historical Provider, MD  metoprolol (LOPRESSOR) 100 MG tablet Take 100 mg by mouth 2 (two) times daily. 07/16/16   Historical Provider, MD       Physical Exam: BP 125/77 (BP Location: Right Arm)   Pulse 75   Temp 98 F (36.7 C) (Oral)   Resp 16   Ht 5\' 11"  (1.803 m)   Wt 86.5 kg (190 lb 11.2  oz)   SpO2 97%   BMI 26.60 kg/m  General appearance: Elderly adult male, alert and in no acute distress.   Eyes: Anicteric, conjunctiva pink, lids and lashes normal. PERRL.    ENT: No nasal deformity, discharge, epistaxis.  Hearing normal. OP moist with dark hairy tongue.   Neck: No neck masses.  Trachea midline.  No thyromegaly/tenderness. Lymph: No cervical or supraclavicular lymphadenopathy. Skin: Warm and dry.  No jaundice.  No suspicious rashes or lesions. Cardiac: Rate regular, rhythm iregular, nl S1-S2, no murmurs appreciated.  Capillary refill is brisk.  JVP normal.  No LE edema.  Radial and DP pulses 2+ and symmetric. Respiratory: Normal respiratory rate and rhythm.  CTAB without rales or wheezes. Abdomen: Abdomen soft.  Mild epigastric TTP without guarding. No ascites, distension, hepatosplenomegaly.   MSK: No deformities or effusions.  No cyanosis or clubbing. Neuro: Cranial nerves normal.  Sensation intact to light touch. Speech is fluent.  Muscle strength normal.    Psych: Sensorium intact and responding to questions, attention normal.  Behavior appropriate.  Affect normal.  Judgment and insight appear normal.     Labs on Admission:  I have personally reviewed following labs and imaging studies: CBC:  Recent Labs Lab 09/16/16 1927  WBC 7.7  HGB 10.9*  HCT 32.2*  MCV 95.5  PLT [id number]   Basic Metabolic Panel:  Recent Labs Lab 09/16/16 1927  NA 140  K 3.7  CL 107  CO2 26  GLUCOSE 172*  BUN 14  CREATININE 0.76  CALCIUM 8.6*   GFR: Estimated Creatinine Clearance: 77.1 mL/min (by C-G formula based on SCr of 0.76 mg/dL).  Liver Function Tests:  Recent Labs Lab 09/16/16 1927  AST 52*  ALT 46  ALKPHOS 89  BILITOT 0.5  PROT 7.3  ALBUMIN 4.2    Recent Labs Lab 09/16/16 1927  LIPASE 23   No results for input(s): AMMONIA in the last 168 hours. Coagulation Profile: No results for input(s): INR, PROTIME in the last 168 hours. Cardiac Enzymes: No  results for input(s): CKTOTAL, CKMB, CKMBINDEX, TROPONINI in the last 168 hours. BNP (last 3 results) No results for input(s): PROBNP in the last 8760 hours. HbA1C: No results for input(s): HGBA1C in the last 72 hours. CBG: No results for input(s): GLUCAP in the last 168 hours. Lipid Profile: No results for input(s): CHOL, HDL, LDLCALC, TRIG, CHOLHDL, LDLDIRECT in the last 72 hours. Thyroid Function Tests: No results for input(s): TSH, T4TOTAL, FREET4, T3FREE, THYROIDAB in the last 72 hours. Anemia Panel: No results for input(s): VITAMINB12, FOLATE, FERRITIN, TIBC, IRON, RETICCTPCT in the last 72 hours. Sepsis Labs: Invalid input(s): PROCALCITONIN, LACTICIDVEN No results found for this or any previous visit (from the past 240 hour(s)).  Radiological Exams on Admission: Personally reviewed: Ct Abdomen Pelvis W Contrast  Result Date: 09/16/2016 CLINICAL DATA:  Mid abdominal pain, nausea, and diarrhea. History of prostate cancer and hernia repair. EXAM: CT ABDOMEN AND PELVIS WITH CONTRAST TECHNIQUE: Multidetector CT imaging of the abdomen and pelvis was performed using the standard protocol following bolus administration of intravenous contrast. CONTRAST:  175mL ISOVUE-300 IOPAMIDOL (ISOVUE-300) INJECTION 61% COMPARISON:  None. FINDINGS: Lower chest: Dependent atelectasis in the lung bases. Small Bochdalek's right diaphragmatic hernia. Coronary artery and aortic valve calcifications. Hepatobiliary: Cholelithiasis with small stone a portion of the gallbladder. No gallbladder wall thickening. No bile duct dilatation. Normal homogeneous liver parenchymal appearance without focal mass lesion in the liver. Pancreas: Unremarkable. No pancreatic ductal dilatation or surrounding inflammatory changes. Spleen: Calcified granulomas in the spleen. Spleen appears atrophic but otherwise homogeneous. Adrenals/Urinary Tract: Adrenal glands are unremarkable. Kidneys are normal, without renal calculi, focal  lesion, or hydronephrosis. Bladder is unremarkable. Stomach/Bowel: Small focal contrast collection adjacent to the duodenum fall with surrounding edema. This likely represents a duodenal ulcer. There is also a small diverticulum of the second portion of the duodenum. Small bowel and colon are not abnormally distended. Diverticulosis of the sigmoid colon without evidence of diverticulitis. Scattered diverticula throughout the colon. The appendix is normal. There is a moderate-sized left inguinal hernia containing a portion of the sigmoid colon but without evidence of proximal obstruction. Vascular/Lymphatic: Aortic atherosclerosis. No enlarged abdominal or pelvic lymph nodes. Reproductive: Metallic structures demonstrated region of prostate gland. These could represent previous treatment with seed implants or surgical clips from prostatectomy. Other: Postoperative changes in the low anterior pelvic wall consistent with mesh hernia repair. No free air or free fluid in the abdomen. Musculoskeletal: Degenerative changes in the lumbar spine. Sternotomy wires. No destructive bone lesions. IMPRESSION: Probable ulcer of the duodenal wall with mild surrounding inflammation. 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 Left inguinal hernia containing sigmoid colon without proximal obstruction. Cholelithiasis. Electronically Signed   By: Lucienne Capers M.D.   On: 09/16/2016 22:17    EKG: Independently reviewed. Rate 81, QTc 504, atrial fibrillation.    Assessment/Plan Principal Problem:   Acute blood loss anemia Active Problems:   Coronary artery disease due to lipid rich plaque s/p CABG 1996   New onset atrial fibrillation (HCC)   History of subdural hematoma   Peptic ulcer disease   Type 2 diabetes mellitus with diabetic neuropathy, without long-term current use of insulin (HCC)   Essential hypertension  1. Acute blood loss anemia and epigastric pain/melena:  Epigastric pain, new anemia and history of  PUD as well as focal contrast enhancement in duodenum on CT.  Hemodynamically stable and minimal clinical bleeding history. -NPO -IVF -IV pantoprazole twice daily -Consult to GI, appreciate cares -Check T&S   2. New Afib:  CHADS2Vasc 5.  Not anticoagulation candidate probably given previous SDH while on aspirin.  No previous history per patient and per Cardiology notes -Continue metoprolol -Check troponin -Check magnesium and TSH -Telemetry monitoring  3. NIDDM:  -Hold metformin and glipizide -SSI every 4 hours while NPO  4. HTN and CV secondary prevention:  -Continue amlodipine, metoprolol -Presume epigastric pain is PUD, but given new Afib, will check troponins     DVT prophylaxis: SCDs  Code Status: FULL  Family Communication: None present  Disposition Plan: Anticipate consultation with GI and likely endoscopy today.   Consults called: None overnight Admission status: OBS, tele At the point of initial evaluation, it is my clinical opinion  that admission for OBSERVATION is reasonable and necessary because the patient's presenting complaints in the context of their chronic conditions represent sufficient risk of deterioration or significant morbidity to constitute reasonable grounds for close observation in the hospital setting, but that the patient may be medically stable for discharge from the hospital within 24 to 48 hours.    Medical decision making: Patient seen at 1:10 AM on 09/17/2016.  The patient was discussed with Dr. Tamala Julian.  What exists of the patient's chart was reviewed in depth and outside records in [facility] were requested and summarized above.  Clinical condition: stable.        Edwin Dada [facility] Pager [phone]

## 2016-09-17 NOTE — Progress Notes (Signed)
Bed Management has been notified of patient's arrival from [facility]. They stated that Dr.Danford will be notified by them.

## 2016-09-17 NOTE — Plan of Care (Signed)
Problem: Nutrition: Goal: Adequate nutrition will be maintained Outcome: Progressing Patient advanced to clear liquid diet

## 2016-09-17 NOTE — Consult Note (Signed)
[facility] Consult: 3:00 PM 09/17/2016  LOS: 0 days    Referring Provider: Dr. Hartford Poli  Primary Care Physician:  Leonel Ramsay, MD Primary Gastroenterologist:  Althia Forts.  In [place]: Current nodal clinic.     Reason for Consultation:  Epigastric pain.  Normocytic anemia.   HPI: Robert Keith is a 82 y.o. male. He is a resident of [place], [place]. Hx type 2 DM, treated with oral agents.  HTN. CAD.  S/p CABG .  History of an arrhythmia: A fib and PVCs per review of previous EKGs.  S/p craniotomy to treat SDH.  S/p left inguinal herniorrhaphy, but hernia has recurred.  Prostate cancer ~ 12 yrs ago, initially treated with cryotherapy but within a couple of years required "64" radiation treatments.  Ever since the radiation treatment, the stool gauge has been narrow but he has formed stools.  He had a colonoscopy in [place] maybe 10 or 12 years ago and had "benign" polyps and was told he wouldn't need to have another colonoscopy. He has been told he has peptic ulcer disease dating back decades ago. However he's only had one upper endoscopy in his life time. This was performed in [place] maybe 10 or 12 years ago. There was an ulcer present. H. pylori was present and he was treated with a combination of antibiotics and bismuth. Currently he takes Pepcid twice a day as PPIs have proven "too strong" in the past and caused him GI upset.  Because of the history of SDH, the patient does not use aspirin products or NSAIDs.  Patient has had episodes of epigastric pain in the past which normally resolve with when necessary bismuth. These only happen every few months. Starting about 4 days ago, he had another one of these episodes with epigastric pain penetrating through to the back which did not improve despite  bismuth. He says the pain intensity was 8/10. He didn't have nausea. He lost his appetite due to the severity of the pain.  He went to the emergency room at [facility] yesterday evening.  After receiving a dose of "antibiotic" the pain resolved and has not recurred.  However he is not receiving antibiotics at the time of transfer or during the course of this admission  Hemoglobin at 7:30 PM yesterday was 10.9, it is 9.7 today. Last apneic hemoglobin 15.2 in January 2016. The MCV is well normal in the 90s. CT of the abdomen pelvis shows probable duodenal ulcer along with duodenal diverticulum. Left inguinal hernia contained sigmoid but is not obstructing. Incidental cholelithiasis.  Transferred from [facility] because they have no GI coverage there.   Past Medical History:  Diagnosis Date  . Cancer [facility])    prostate  . Coronary artery disease   . Diabetes mellitus without complication ([place])   . Essential tremor   . H. pylori infection   . Hypertension   . Intracranial hemorrhage [facility])     Past Surgical History:  Procedure Laterality Date  . brain surgery for bleed    . cardiac bypass    .  CORONARY ARTERY BYPASS GRAFT      Prior to Admission medications   Medication Sig Start Date End Date Taking? Authorizing Provider  famotidine (PEPCID) 20 MG tablet Take 20 mg by mouth 2 (two) times daily.   Yes Historical Provider, MD  glipiZIDE (GLUCOTROL) 5 MG tablet Take 5 mg by mouth daily.  07/02/16  Yes Historical Provider, MD  metFORMIN (GLUCOPHAGE) 1000 MG tablet Take 1,000 mg by mouth daily.  10/05/15  Yes Historical Provider, MD  metoprolol (LOPRESSOR) 100 MG tablet Take 50-100 mg by mouth See admin instructions. Take 100 mg by mouth in the morning and take 50 mg by mouth at lunch 07/16/16  Yes Historical Provider, MD  pravastatin (PRAVACHOL) 40 MG tablet Take 40 mg by mouth daily. 11/23/15  Yes Historical Provider, MD  propranolol (INDERAL) 40 MG tablet Take 40 mg by mouth at  bedtime.   Yes Historical Provider, MD    Scheduled Meds: . [START ON 09/18/2016] Influenza vac split quadrivalent PF  0.5 mL Intramuscular Tomorrow-1000  . insulin aspart  0-15 Units Subcutaneous Q4H  . metoprolol  100 mg Oral BID  . pantoprazole (PROTONIX) IV  40 mg Intravenous Q12H  . pravastatin  40 mg Oral Daily   Infusions: . 0.9 % NaCl with KCl 20 mEq / L 100 mL/hr at 09/17/16 1159   PRN Meds: acetaminophen **OR** acetaminophen, ondansetron **OR** ondansetron (ZOFRAN) IV   Allergies as of 09/16/2016 - Review Complete 09/16/2016  Allergen Reaction Noted  . Phenobarbital Other (See Comments) 07/09/2015    Family History  Problem Relation Age of Onset  . Stroke Mother   . Heart attack Father     Social History   Social History  . Marital status: Married    Spouse name: N/A  . Number of children: N/A  . Years of education: N/A   Occupational History  . Not on file.   Social History Main Topics  . Smoking status: Former Research scientist (life sciences)  . Smokeless tobacco: Never Used  . Alcohol use No  . Drug use: Unknown  . Sexual activity: Not on file   Other Topics Concern  . Not on file   Social History Narrative  . No narrative on file    REVIEW OF SYSTEMS: Constitutional:  Weight is stable. Patient uses a walker to get around due to unsteady gait and a history of falls. ENT:  No nose bleeds Pulm:  No shortness of breath, no cough. CV:  History of irregular rhythm but he does not appreciate palpitations. If he eats too much salt or is on his feet for too long he will get pedal/ankle edema.  GU:  No hematuria, no frequency GI:  Per history of present illness. Heme:  No excessive bleeding or bruising.   Transfusions:  No previous history of transfusions. Neuro:  No headaches, no peripheral tingling or numbness.  Patient has an essential tremor and this has affected his balance which is why he uses the walker. Derm:  No itching, no rash or sores.  Endocrine:  No sweats or  chills.  No polyuria or dysuria Immunization:  Did not inquire. Travel:  None beyond local counties in last few months.    PHYSICAL EXAM: Vital signs in last 24 hours: Vitals:   09/17/16 0500 09/17/16 0952  BP: 125/77 131/61  Pulse: 75 91  Resp: 16 18  Temp: 98 F (36.7 C) 98 F (36.7 C)   Wt Readings from Last 3 Encounters:  09/17/16 86.5 kg (190 lb  11.2 oz)  09/16/16 88.5 kg (195 lb)  03/30/16 83.9 kg (185 lb)   General: Tremulous, frail, aged white male. He is comfortable. Head:  No signs of head trauma. Facies symmetric.  Eyes:  No scleral icterus, no conjunctival pallor. EOMI. Ears:  Slightly HOH.  Nose:  No discharge or congestion Mouth:  Mucous membranes slightly dry. Tongue is midline. No mucosal lesions or exudates. Neck:  No JVD, no bruits, no thyromegaly or masses. Lungs:  No cough. Minor dyspnea with prolonged speech lungs are clear bilaterally with no adventitious sounds. Heart: Underlying regular regular rhythm but frequent dropped beats.  No MRG. S1, S2 present. Abdomen:  Soft. NT, ND. No masses. No HSM.   Rectal: Deferred.   Musc/Skeltl: Kyphotic spine. No erythematous or severely deformed joints. Extremities:  No CCE.  Neurologic:  The patient is tremulous in the trunk, head and limbs. His speech is slow but accurate and easily understood. He is fully alert. Oriented 3. Skin:  No sores, no telangiectasia. Tattoos:  None observed. Nodes:  No cervical adenopathy.   Psych:  Cooperative, pleasant, not agitated or obviously depressed.  Intake/Output from previous day: 10/01 0701 - 10/02 0700 In: 345 [I.V.:345] Out: 360 [Urine:360] Intake/Output this shift: Total I/O In: 0  Out: 250 [Urine:250]  LAB RESULTS:  Recent Labs  09/16/16 1927 09/17/16 0839  WBC 7.7 6.5  HGB 10.9* 9.7*  HCT 32.2* 30.2*  PLT 217 217   BMET Lab Results  Component Value Date   NA 140 09/16/2016   NA 142 12/22/2014   K 3.7 09/16/2016   K 4.3 12/22/2014   CL 107  09/16/2016   CL 108 (H) 12/22/2014   CO2 26 09/16/2016   CO2 27 12/22/2014   GLUCOSE 172 (H) 09/16/2016   GLUCOSE 119 (H) 12/22/2014   BUN 14 09/16/2016   BUN 20 (H) 12/22/2014   CREATININE 0.76 09/16/2016   CREATININE 1.13 12/22/2014   CALCIUM 8.6 (L) 09/16/2016   CALCIUM 8.8 12/22/2014   LFT  Recent Labs  09/16/16 1927  PROT 7.3  ALBUMIN 4.2  AST 52*  ALT 46  ALKPHOS 89  BILITOT 0.5   PT/INR Lab Results  Component Value Date   INR 1.21 09/17/2016   Hepatitis Panel No results for input(s): HEPBSAG, HCVAB, HEPAIGM, HEPBIGM in the last 72 hours. C-Diff No components found for: CDIFF Lipase     Component Value Date/Time   LIPASE 23 09/16/2016 1927    Drugs of Abuse  No results found for: LABOPIA, COCAINSCRNUR, LABBENZ, AMPHETMU, THCU, LABBARB   RADIOLOGY STUDIES: Ct Abdomen Pelvis W Contrast  Result Date: 09/16/2016 CLINICAL DATA:  Mid abdominal pain, nausea, and diarrhea. History of prostate cancer and hernia repair. EXAM: CT ABDOMEN AND PELVIS WITH CONTRAST TECHNIQUE: Multidetector CT imaging of the abdomen and pelvis was performed using the standard protocol following bolus administration of intravenous contrast. CONTRAST:  157mL ISOVUE-300 IOPAMIDOL (ISOVUE-300) INJECTION 61% COMPARISON:  None. FINDINGS: Lower chest: Dependent atelectasis in the lung bases. Small Bochdalek's right diaphragmatic hernia. Coronary artery and aortic valve calcifications. Hepatobiliary: Cholelithiasis with small stone a portion of the gallbladder. No gallbladder wall thickening. No bile duct dilatation. Normal homogeneous liver parenchymal appearance without focal mass lesion in the liver. Pancreas: Unremarkable. No pancreatic ductal dilatation or surrounding inflammatory changes. Spleen: Calcified granulomas in the spleen. Spleen appears atrophic but otherwise homogeneous. Adrenals/Urinary Tract: Adrenal glands are unremarkable. Kidneys are normal, without renal calculi, focal lesion, or  hydronephrosis. Bladder is unremarkable. Stomach/Bowel: Small focal  contrast collection adjacent to the duodenum fall with surrounding edema. This likely represents a duodenal ulcer. There is also a small diverticulum of the second portion of the duodenum. Small bowel and colon are not abnormally distended. Diverticulosis of the sigmoid colon without evidence of diverticulitis. Scattered diverticula throughout the colon. The appendix is normal. There is a moderate-sized left inguinal hernia containing a portion of the sigmoid colon but without evidence of proximal obstruction. Vascular/Lymphatic: Aortic atherosclerosis. No enlarged abdominal or pelvic lymph nodes. Reproductive: Metallic structures demonstrated region of prostate gland. These could represent previous treatment with seed implants or surgical clips from prostatectomy. Other: Postoperative changes in the low anterior pelvic wall consistent with mesh hernia repair. No free air or free fluid in the abdomen. Musculoskeletal: Degenerative changes in the lumbar spine. Sternotomy wires. No destructive bone lesions. IMPRESSION: Probable ulcer of the duodenal wall with mild surrounding inflammation. 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 Left inguinal hernia containing sigmoid colon without proximal obstruction. Cholelithiasis. Electronically Signed   By: Lucienne Capers M.D.   On: 09/16/2016 22:17    ENDOSCOPIC STUDIES: Per history of present illness.  IMPRESSION:   *  Epigastric pain with CT scan showing duodenal ulcer. Previous ulcer confirmed on EGD and treatment for H. pylori greater than 10 years ago.  *  Normocytic anemia. No melena and no bleeding per rectum.   PLAN:     *  Obtain stool Hemoccult.  Check serum H. pylori antibody.    *  Continue twice-daily IV Protonix. In the past he has not tolerated PPI well but we are going to have to rechallenge him with this.  *  Dr. Silverio Decamp will be seeing the patient later today and  decide whether or not he will undergo endoscopic evaluation.  In the meantime will start clear liquids   Azucena Freed  09/17/2016, 3:00 PM Pager: [phone]

## 2016-09-17 NOTE — Progress Notes (Signed)
PROGRESS NOTE  LEANTHONY HOOD  [id number] DOB: 1933/08/02 DOA: 09/17/2016 PCP: Leonel Ramsay, MD Outpatient Specialists:  Subjective: No complaints today.  Brief Narrative:  BRENNDON MATHUS is a 82 y.o. male with a past medical history significant for PUD, NIDDM, HTN, CAD s/p CABG in 1996 and hx of SDH while on aspirin who presents with epigastric pain.  Over the last 10 days, the patient has had onset of slowly progressive epigastric discomfort.  This is not worse with meals or between meals, nor with position.  He is sedentary.  The pain is gnawing and located in the epigastrium, non-radiating, not associated with shortness of breath or palpitations, nor with hematemesis or hematochezia.  He has noted 4-5 "dark tarry" stools in the last week.  Has been taking "all the Pepto-Bismol you can take" without improvement.   Assessment & Plan:   Principal Problem:   Acute blood loss anemia Active Problems:   Coronary artery disease due to lipid rich plaque s/p CABG 1996   New onset atrial fibrillation (HCC)   History of subdural hematoma   Peptic ulcer disease   Type 2 diabetes mellitus with diabetic neuropathy, without long-term current use of insulin ([place])   Essential hypertension   Patient seen and examined, and data base reviewed. This is a no charge note. Patient seen earlier today by my colleague Dr. Loleta Books. Acute blood loss anemia, epigastric pain and melena and history of PUD suggesting ulcers. ? New atrial fibrillation, he is not candidate for anticoagulation because of history of PUD and SDH.  Acute blood loss anemia and epigastric pain/melena:  Epigastric pain, new anemia and history of PUD as well as focal contrast enhancement in duodenum on CT.  Hemodynamically stable and minimal clinical bleeding history. -NPO -IVF -IV pantoprazole twice daily -Consult to GI, appreciate cares -Check T&S  New Afib:  CHADS2Vasc 5.  Not anticoagulation candidate probably given  previous SDH while on aspirin.  No previous history per patient and per Cardiology notes -Continue metoprolol -Check troponin -Check magnesium and TSH -Telemetry monitoring  NIDDM:  -Hold metformin and glipizide -SSI every 4 hours while NPO  HTN and CV secondary prevention:  -Continue amlodipine, metoprolol -Presume epigastric pain is PUD, but given new Afib, will check troponins   DVT prophylaxis: SCDs Code Status: Full Code Family Communication:  Disposition Plan:  Diet: Diet NPO time specified Except for: Sips with Meds  Consultants:   GI  Procedures:   None  Antimicrobials:   None  Objective: Vitals:   09/17/16 0030 09/17/16 0500 09/17/16 0952  BP:  125/77 131/61  Pulse:  75 91  Resp:  16 18  Temp:  98 F (36.7 C) 98 F (36.7 C)  TempSrc:  Oral Oral  SpO2:  97% 99%  Weight: 86.5 kg (190 lb 11.2 oz)    Height: 5\' 11"  (1.803 m)      Intake/Output Summary (Last 24 hours) at 09/17/16 1215 Last data filed at 09/17/16 0900  Gross per 24 hour  Intake              345 ml  Output              360 ml  Net              -15 ml   Filed Weights   09/17/16 0030  Weight: 86.5 kg (190 lb 11.2 oz)    Examination: General exam: Appears calm and comfortable  Respiratory system: Clear to auscultation. Respiratory  effort normal. Cardiovascular system: S1 & S2 heard, RRR. No JVD, murmurs, rubs, gallops or clicks. No pedal edema. Gastrointestinal system: Abdomen is nondistended, soft and nontender. No organomegaly or masses felt. Normal bowel sounds heard. Central nervous system: Alert and oriented. No focal neurological deficits. Extremities: Symmetric 5 x 5 power. Skin: No rashes, lesions or ulcers Psychiatry: Judgement and insight appear normal. Mood & affect appropriate.   Data Reviewed: I have personally reviewed following labs and imaging studies  CBC:  Recent Labs Lab 09/16/16 1927 09/17/16 0839  WBC 7.7 6.5  HGB 10.9* 9.7*  HCT 32.2* 30.2*  MCV  95.5 98.1  PLT 217 [id number]   Basic Metabolic Panel:  Recent Labs Lab 09/16/16 1927 09/17/16 0325  NA 140  --   K 3.7  --   CL 107  --   CO2 26  --   GLUCOSE 172*  --   BUN 14  --   CREATININE 0.76  --   CALCIUM 8.6*  --   MG  --  2.2   GFR: Estimated Creatinine Clearance: 77.1 mL/min (by C-G formula based on SCr of 0.76 mg/dL). Liver Function Tests:  Recent Labs Lab 09/16/16 1927  AST 52*  ALT 46  ALKPHOS 89  BILITOT 0.5  PROT 7.3  ALBUMIN 4.2    Recent Labs Lab 09/16/16 1927  LIPASE 23   No results for input(s): AMMONIA in the last 168 hours. Coagulation Profile:  Recent Labs Lab 09/17/16 0325  INR 1.21   Cardiac Enzymes:  Recent Labs Lab 09/17/16 0325 09/17/16 0839  TROPONINI <0.03 <0.03   BNP (last 3 results) No results for input(s): PROBNP in the last 8760 hours. HbA1C: No results for input(s): HGBA1C in the last 72 hours. CBG:  Recent Labs Lab 09/17/16 0035 09/17/16 0502 09/17/16 0721 09/17/16 1138  GLUCAP 98 113* 129* 151*   Lipid Profile: No results for input(s): CHOL, HDL, LDLCALC, TRIG, CHOLHDL, LDLDIRECT in the last 72 hours. Thyroid Function Tests:  Recent Labs  09/17/16 0332  TSH 3.622   Anemia Panel: No results for input(s): VITAMINB12, FOLATE, FERRITIN, TIBC, IRON, RETICCTPCT in the last 72 hours. Urine analysis:    Component Value Date/Time   COLORURINE Yellow 12/22/2014 1430   APPEARANCEUR Clear 12/22/2014 1430   LABSPEC 1.017 12/22/2014 1430   PHURINE 5.0 12/22/2014 1430   GLUCOSEU Negative 12/22/2014 1430   HGBUR Negative 12/22/2014 1430   BILIRUBINUR Negative 12/22/2014 1430   KETONESUR Negative 12/22/2014 1430   PROTEINUR Negative 12/22/2014 1430   NITRITE Negative 12/22/2014 1430   LEUKOCYTESUR Negative 12/22/2014 1430   Sepsis Labs: @LABRCNTIP (procalcitonin:4,lacticidven:4)  )No results found for this or any previous visit (from the past 240 hour(s)).   Invalid input(s): PROCALCITONIN,  LACTICACIDVEN   Radiology Studies: Ct Abdomen Pelvis W Contrast  Result Date: 09/16/2016 CLINICAL DATA:  Mid abdominal pain, nausea, and diarrhea. History of prostate cancer and hernia repair. EXAM: CT ABDOMEN AND PELVIS WITH CONTRAST TECHNIQUE: Multidetector CT imaging of the abdomen and pelvis was performed using the standard protocol following bolus administration of intravenous contrast. CONTRAST:  1108mL ISOVUE-300 IOPAMIDOL (ISOVUE-300) INJECTION 61% COMPARISON:  None. FINDINGS: Lower chest: Dependent atelectasis in the lung bases. Small Bochdalek's right diaphragmatic hernia. Coronary artery and aortic valve calcifications. Hepatobiliary: Cholelithiasis with small stone a portion of the gallbladder. No gallbladder wall thickening. No bile duct dilatation. Normal homogeneous liver parenchymal appearance without focal mass lesion in the liver. Pancreas: Unremarkable. No pancreatic ductal dilatation or surrounding inflammatory changes. Spleen: Calcified  granulomas in the spleen. Spleen appears atrophic but otherwise homogeneous. Adrenals/Urinary Tract: Adrenal glands are unremarkable. Kidneys are normal, without renal calculi, focal lesion, or hydronephrosis. Bladder is unremarkable. Stomach/Bowel: Small focal contrast collection adjacent to the duodenum fall with surrounding edema. This likely represents a duodenal ulcer. There is also a small diverticulum of the second portion of the duodenum. Small bowel and colon are not abnormally distended. Diverticulosis of the sigmoid colon without evidence of diverticulitis. Scattered diverticula throughout the colon. The appendix is normal. There is a moderate-sized left inguinal hernia containing a portion of the sigmoid colon but without evidence of proximal obstruction. Vascular/Lymphatic: Aortic atherosclerosis. No enlarged abdominal or pelvic lymph nodes. Reproductive: Metallic structures demonstrated region of prostate gland. These could represent previous  treatment with seed implants or surgical clips from prostatectomy. Other: Postoperative changes in the low anterior pelvic wall consistent with mesh hernia repair. No free air or free fluid in the abdomen. Musculoskeletal: Degenerative changes in the lumbar spine. Sternotomy wires. No destructive bone lesions. IMPRESSION: Probable ulcer of the duodenal wall with mild surrounding inflammation. 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 Left inguinal hernia containing sigmoid colon without proximal obstruction. Cholelithiasis. Electronically Signed   By: Lucienne Capers M.D.   On: 09/16/2016 22:17        Scheduled Meds: . [START ON 09/18/2016] Influenza vac split quadrivalent PF  0.5 mL Intramuscular Tomorrow-1000  . insulin aspart  0-15 Units Subcutaneous Q4H  . metoprolol  100 mg Oral BID  . pantoprazole (PROTONIX) IV  40 mg Intravenous Q12H  . pravastatin  40 mg Oral Daily   Continuous Infusions: . 0.9 % NaCl with KCl 20 mEq / L 100 mL/hr at 09/17/16 1159     LOS: 0 days    Time spent: 35 minutes    Jamyrah Saur A, MD [facility] Pager [phone]  If 7PM-7AM, please contact night-coverage [email or web address] Password [email or web address] 09/17/2016, 12:15 PM

## 2016-09-18 ENCOUNTER — Encounter (HOSPITAL_COMMUNITY): Payer: Self-pay

## 2016-09-18 ENCOUNTER — Encounter (HOSPITAL_COMMUNITY)
Admission: AD | Disposition: A | Discharge status: Home / Self Care | Payer: Self-pay | Source: Other Acute Inpatient Hospital | Attending: Internal Medicine

## 2016-09-18 ENCOUNTER — Observation Stay (HOSPITAL_COMMUNITY): Payer: Medicare Other | Admitting: Anesthesiology

## 2016-09-18 DIAGNOSIS — Z823 Family history of stroke: Secondary | ICD-10-CM | POA: Diagnosis not present

## 2016-09-18 DIAGNOSIS — Z23 Encounter for immunization: Secondary | ICD-10-CM | POA: Diagnosis not present

## 2016-09-18 DIAGNOSIS — Z951 Presence of aortocoronary bypass graft: Secondary | ICD-10-CM | POA: Diagnosis not present

## 2016-09-18 DIAGNOSIS — I25810 Atherosclerosis of coronary artery bypass graft(s) without angina pectoris: Secondary | ICD-10-CM | POA: Diagnosis not present

## 2016-09-18 DIAGNOSIS — Z87891 Personal history of nicotine dependence: Secondary | ICD-10-CM | POA: Diagnosis not present

## 2016-09-18 DIAGNOSIS — Z8679 Personal history of other diseases of the circulatory system: Secondary | ICD-10-CM | POA: Diagnosis not present

## 2016-09-18 DIAGNOSIS — K315 Obstruction of duodenum: Secondary | ICD-10-CM | POA: Diagnosis present

## 2016-09-18 DIAGNOSIS — Z8249 Family history of ischemic heart disease and other diseases of the circulatory system: Secondary | ICD-10-CM | POA: Diagnosis not present

## 2016-09-18 DIAGNOSIS — K921 Melena: Secondary | ICD-10-CM | POA: Diagnosis present

## 2016-09-18 DIAGNOSIS — K269 Duodenal ulcer, unspecified as acute or chronic, without hemorrhage or perforation: Secondary | ICD-10-CM | POA: Diagnosis present

## 2016-09-18 DIAGNOSIS — I10 Essential (primary) hypertension: Secondary | ICD-10-CM | POA: Diagnosis present

## 2016-09-18 DIAGNOSIS — R1013 Epigastric pain: Secondary | ICD-10-CM | POA: Diagnosis present

## 2016-09-18 DIAGNOSIS — I4891 Unspecified atrial fibrillation: Secondary | ICD-10-CM | POA: Diagnosis present

## 2016-09-18 DIAGNOSIS — E785 Hyperlipidemia, unspecified: Secondary | ICD-10-CM | POA: Diagnosis present

## 2016-09-18 DIAGNOSIS — E1140 Type 2 diabetes mellitus with diabetic neuropathy, unspecified: Secondary | ICD-10-CM | POA: Diagnosis present

## 2016-09-18 DIAGNOSIS — K279 Peptic ulcer, site unspecified, unspecified as acute or chronic, without hemorrhage or perforation: Secondary | ICD-10-CM | POA: Diagnosis not present

## 2016-09-18 DIAGNOSIS — I2510 Atherosclerotic heart disease of native coronary artery without angina pectoris: Secondary | ICD-10-CM | POA: Diagnosis present

## 2016-09-18 DIAGNOSIS — K922 Gastrointestinal hemorrhage, unspecified: Secondary | ICD-10-CM | POA: Diagnosis not present

## 2016-09-18 DIAGNOSIS — Z7984 Long term (current) use of oral hypoglycemic drugs: Secondary | ICD-10-CM | POA: Diagnosis not present

## 2016-09-18 DIAGNOSIS — D62 Acute posthemorrhagic anemia: Secondary | ICD-10-CM | POA: Diagnosis present

## 2016-09-18 HISTORY — PX: ESOPHAGOGASTRODUODENOSCOPY (EGD) WITH PROPOFOL: SHX5813

## 2016-09-18 LAB — CBC
HEMATOCRIT: 29.3 % — AB (ref 39.0–52.0)
HEMOGLOBIN: 9.1 g/dL — AB (ref 13.0–17.0)
MCH: 31 pg (ref 26.0–34.0)
MCHC: 31.1 g/dL (ref 30.0–36.0)
MCV: 99.7 fL (ref 78.0–100.0)
Platelets: 219 10*3/uL (ref 150–400)
RBC: 2.94 MIL/uL — AB (ref 4.22–5.81)
RDW: 13.9 % (ref 11.5–15.5)
WBC: 7.9 10*3/uL (ref 4.0–10.5)

## 2016-09-18 LAB — BASIC METABOLIC PANEL
ANION GAP: 7 (ref 5–15)
BUN: 6 mg/dL (ref 6–20)
CO2: 23 mmol/L (ref 22–32)
Calcium: 8.1 mg/dL — ABNORMAL LOW (ref 8.9–10.3)
Chloride: 109 mmol/L (ref 101–111)
Creatinine, Ser: 0.79 mg/dL (ref 0.61–1.24)
GFR calc Af Amer: >60 mL/min (ref >60)
GLUCOSE: 165 mg/dL — AB (ref 65–99)
POTASSIUM: 4.1 mmol/L (ref 3.5–5.1)
SODIUM: 139 mmol/L (ref 135–145)

## 2016-09-18 LAB — GLUCOSE, CAPILLARY
GLUCOSE-CAPILLARY: 133 mg/dL — AB (ref 65–99)
GLUCOSE-CAPILLARY: 158 mg/dL — AB (ref 65–99)
GLUCOSE-CAPILLARY: 137 mg/dL — AB (ref 65–99)
Glucose-Capillary: 166 mg/dL — ABNORMAL HIGH (ref 65–99)
Glucose-Capillary: 162 mg/dL — ABNORMAL HIGH (ref 65–99)

## 2016-09-18 LAB — H. PYLORI ANTIBODY, IGG

## 2016-09-18 SURGERY — ESOPHAGOGASTRODUODENOSCOPY (EGD) WITH PROPOFOL
Anesthesia: Monitor Anesthesia Care

## 2016-09-18 SURGERY — EGD (ESOPHAGOGASTRODUODENOSCOPY)
Anesthesia: Monitor Anesthesia Care

## 2016-09-18 MED ORDER — PANTOPRAZOLE SODIUM 40 MG IV SOLR: 40.0000 mg | Freq: Two times a day (BID) | INTRAVENOUS | Status: DC

## 2016-09-18 MED ORDER — PROPOFOL 10 MG/ML IV BOLUS
INTRAVENOUS | Status: DC | PRN
  Administered 2016-09-18: 30 mg via INTRAVENOUS
  Administered 2016-09-18: 20 mg via INTRAVENOUS

## 2016-09-18 MED ORDER — SODIUM CHLORIDE 0.9 % IV SOLN
INTRAVENOUS | Status: DC | PRN
  Administered 2016-09-18: 14:00:00 via INTRAVENOUS

## 2016-09-18 MED ORDER — BUTAMBEN-TETRACAINE-BENZOCAINE 2-2-14 % EX AERO
INHALATION_SPRAY | CUTANEOUS | Status: DC | PRN
  Administered 2016-09-18: 2 via TOPICAL

## 2016-09-18 MED ORDER — SODIUM CHLORIDE 0.9 % IV SOLN
8.0000 mg/h | INTRAVENOUS | Status: DC
  Administered 2016-09-18 – 2016-09-20 (×4): 8 mg/h via INTRAVENOUS
  Filled 2016-09-18 (×12): qty 80

## 2016-09-18 MED ORDER — PROPOFOL 500 MG/50ML IV EMUL
INTRAVENOUS | Status: DC | PRN
  Administered 2016-09-18: 50 ug/kg/min via INTRAVENOUS

## 2016-09-18 NOTE — Op Note (Signed)
[facility] Patient Name: Robert Keith Procedure Date : 09/18/2016 MRN: [id number] Attending MD: [name] , MD Date of Birth: Jun 11, 1933 CSN: [id number] Age: 82 Admit Type: Inpatient Procedure:                Upper GI endoscopy Indications:              Recent gastrointestinal bleeding, Endoscopy to                            confirm suspected neoplastic lesion of the duodenum                            seen on previous imaging study, Epigastric                            abdominal pain Providers:                [name], MD, Carolynn Comment, RN,                            Ralene Bathe, Technician Referring MD:              Medicines:                Monitored Anesthesia Care Complications:            No immediate complications. Estimated Blood Loss:     Estimated blood loss was minimal. Procedure:                Pre-Anesthesia Assessment:                           - Prior to the procedure, a History and Physical                            was performed, and patient medications and                            allergies were reviewed. The patient's tolerance of                            previous anesthesia was also reviewed. The risks                            and benefits of the procedure and the sedation                            options and risks were discussed with the patient.                            All questions were answered, and informed consent                            was obtained. Prior Anticoagulants: The patient has                            taken  no previous anticoagulant or antiplatelet                            agents. ASA Grade Assessment: IV - A patient with                            severe systemic disease that is a constant threat                            to life. After reviewing the risks and benefits,                            the patient was deemed in satisfactory condition to                            undergo the  procedure.                           After obtaining informed consent, the endoscope was                            passed under direct vision. Throughout the                            procedure, the patient's blood pressure, pulse, and                            oxygen saturations were monitored continuously. The                            [id number] scope was introduced through the                            mouth, and advanced to the second part of duodenum.                            The [id number]) scope was introduced through                            the and advanced to the. The upper GI endoscopy was                            somewhat difficult due to stenosis. Successful                            completion of the procedure was aided by                            withdrawing the scope and replacing with the                            pediatric endoscope. The patient tolerated the  procedure well. Scope In: Scope Out: Findings:      The esophagus was normal.      The stomach was normal.      Biopsies were taken with a cold forceps in the gastric body and in the       gastric antrum for Helicobacter pylori testing using CLOtest.      An acquired benign-appearing, intrinsic severe stenosis was found in the       duodenal sweep between bulb and second portion of the duodenum and was       traversed only after downsizing scope to pediatric colonoscope. Biopsies       were taken with a cold forceps for histology.      One non-bleeding cratered duodenal ulcer with pigmented material was       found in the second portion of the duodenum. The lesion was 10 mm in       largest dimension. No active bleeding Impression:               - Normal esophagus.                           - Normal stomach.                           - Acquired duodenal stenosis. Biopsied.                           - One non-bleeding duodenal ulcer with pigmented                             material.                           - Biopsies were taken with a cold forceps for                            Helicobacter pylori testing using CLOtest. Moderate Sedation:      N/A Recommendation:           - Mechanical soft diet for 2 weeks.                           - Give Protonix (pantoprazole): 8 mg/hr IV by                            continuous infusion for 3 days.                           - Continue present medications.                           - Await pathology results.                           - No ibuprofen, naproxen, or other non-steroidal                            anti-inflammatory drugs. Procedure Code(s):        --- Professional ---  [id number], Esophagogastroduodenoscopy, flexible,                            transoral; with biopsy, single or multiple Diagnosis Code(s):        --- Professional ---                           K31.5, Obstruction of duodenum                           K26.9, Duodenal ulcer, unspecified as acute or                            chronic, without hemorrhage or perforation                           K92.2, Gastrointestinal hemorrhage, unspecified                           R10.13, Epigastric pain                           R93.3, Abnormal findings on diagnostic imaging of                            other parts of digestive tract CPT copyright 2016 [employer]. All rights reserved. The codes documented in this report are preliminary and upon coder review may  be revised to meet current compliance requirements. [name], MD 09/18/2016 2:38:48 PM This report has been signed electronically. Number of Addenda: 0

## 2016-09-18 NOTE — Anesthesia Postprocedure Evaluation (Signed)
Anesthesia Post Note  Patient: BLACE LEPRE  Procedure(s) Performed: Procedure(s) (LRB): ESOPHAGOGASTRODUODENOSCOPY (EGD) WITH PROPOFOL (N/A)  Patient location during evaluation: PACU Anesthesia Type: MAC Level of consciousness: awake and alert Pain management: pain level controlled Vital Signs Assessment: post-procedure vital signs reviewed and stable Respiratory status: spontaneous breathing, nonlabored ventilation, respiratory function stable and patient connected to nasal cannula oxygen Cardiovascular status: stable and blood pressure returned to baseline Anesthetic complications: no    Last Vitals:  Vitals:   09/18/16 1255 09/18/16 1428  BP: 112/68 (!) 115/59  Pulse: 63 69  Resp: 15 16  Temp: 36.6 C     Last Pain:  Vitals:   09/18/16 1255  TempSrc: Oral  PainSc:                  Zenaida Deed

## 2016-09-18 NOTE — H&P (View-Only) (Signed)
[facility] Consult: 3:00 PM 09/17/2016  LOS: 0 days    Referring Provider: Dr. Hartford Poli  Primary Care Physician:  Leonel Ramsay, MD Primary Gastroenterologist:  Althia Forts.  In [place]: Current nodal clinic.     Reason for Consultation:  Epigastric pain.  Normocytic anemia.   HPI: Robert Keith is a 82 y.o. male. He is a resident of [place], [place]. Hx type 2 DM, treated with oral agents.  HTN. CAD.  S/p CABG .  History of an arrhythmia: A fib and PVCs per review of previous EKGs.  S/p craniotomy to treat SDH.  S/p left inguinal herniorrhaphy, but hernia has recurred.  Prostate cancer ~ 12 yrs ago, initially treated with cryotherapy but within a couple of years required "24" radiation treatments.  Ever since the radiation treatment, the stool gauge has been narrow but he has formed stools.  He had a colonoscopy in [place] maybe 10 or 12 years ago and had "benign" polyps and was told he wouldn't need to have another colonoscopy. He has been told he has peptic ulcer disease dating back decades ago. However he's only had one upper endoscopy in his life time. This was performed in [place] maybe 10 or 12 years ago. There was an ulcer present. H. pylori was present and he was treated with a combination of antibiotics and bismuth. Currently he takes Pepcid twice a day as PPIs have proven "too strong" in the past and caused him GI upset.  Because of the history of SDH, the patient does not use aspirin products or NSAIDs.  Patient has had episodes of epigastric pain in the past which normally resolve with when necessary bismuth. These only happen every few months. Starting about 4 days ago, he had another one of these episodes with epigastric pain penetrating through to the back which did not improve despite  bismuth. He says the pain intensity was 8/10. He didn't have nausea. He lost his appetite due to the severity of the pain.  He went to the emergency room at [facility] yesterday evening.  After receiving a dose of "antibiotic" the pain resolved and has not recurred.  However he is not receiving antibiotics at the time of transfer or during the course of this admission  Hemoglobin at 7:30 PM yesterday was 10.9, it is 9.7 today. Last apneic hemoglobin 15.2 in January 2016. The MCV is well normal in the 90s. CT of the abdomen pelvis shows probable duodenal ulcer along with duodenal diverticulum. Left inguinal hernia contained sigmoid but is not obstructing. Incidental cholelithiasis.  Transferred from [facility] because they have no GI coverage there.   Past Medical History:  Diagnosis Date  . Cancer [facility])    prostate  . Coronary artery disease   . Diabetes mellitus without complication ([place])   . Essential tremor   . H. pylori infection   . Hypertension   . Intracranial hemorrhage [facility])     Past Surgical History:  Procedure Laterality Date  . brain surgery for bleed    . cardiac bypass    .  CORONARY ARTERY BYPASS GRAFT      Prior to Admission medications   Medication Sig Start Date End Date Taking? Authorizing Provider  famotidine (PEPCID) 20 MG tablet Take 20 mg by mouth 2 (two) times daily.   Yes Historical Provider, MD  glipiZIDE (GLUCOTROL) 5 MG tablet Take 5 mg by mouth daily.  07/02/16  Yes Historical Provider, MD  metFORMIN (GLUCOPHAGE) 1000 MG tablet Take 1,000 mg by mouth daily.  10/05/15  Yes Historical Provider, MD  metoprolol (LOPRESSOR) 100 MG tablet Take 50-100 mg by mouth See admin instructions. Take 100 mg by mouth in the morning and take 50 mg by mouth at lunch 07/16/16  Yes Historical Provider, MD  pravastatin (PRAVACHOL) 40 MG tablet Take 40 mg by mouth daily. 11/23/15  Yes Historical Provider, MD  propranolol (INDERAL) 40 MG tablet Take 40 mg by mouth at  bedtime.   Yes Historical Provider, MD    Scheduled Meds: . [START ON 09/18/2016] Influenza vac split quadrivalent PF  0.5 mL Intramuscular Tomorrow-1000  . insulin aspart  0-15 Units Subcutaneous Q4H  . metoprolol  100 mg Oral BID  . pantoprazole (PROTONIX) IV  40 mg Intravenous Q12H  . pravastatin  40 mg Oral Daily   Infusions: . 0.9 % NaCl with KCl 20 mEq / L 100 mL/hr at 09/17/16 1159   PRN Meds: acetaminophen **OR** acetaminophen, ondansetron **OR** ondansetron (ZOFRAN) IV   Allergies as of 09/16/2016 - Review Complete 09/16/2016  Allergen Reaction Noted  . Phenobarbital Other (See Comments) 07/09/2015    Family History  Problem Relation Age of Onset  . Stroke Mother   . Heart attack Father     Social History   Social History  . Marital status: Married    Spouse name: N/A  . Number of children: N/A  . Years of education: N/A   Occupational History  . Not on file.   Social History Main Topics  . Smoking status: Former Research scientist (life sciences)  . Smokeless tobacco: Never Used  . Alcohol use No  . Drug use: Unknown  . Sexual activity: Not on file   Other Topics Concern  . Not on file   Social History Narrative  . No narrative on file    REVIEW OF SYSTEMS: Constitutional:  Weight is stable. Patient uses a walker to get around due to unsteady gait and a history of falls. ENT:  No nose bleeds Pulm:  No shortness of breath, no cough. CV:  History of irregular rhythm but he does not appreciate palpitations. If he eats too much salt or is on his feet for too long he will get pedal/ankle edema.  GU:  No hematuria, no frequency GI:  Per history of present illness. Heme:  No excessive bleeding or bruising.   Transfusions:  No previous history of transfusions. Neuro:  No headaches, no peripheral tingling or numbness.  Patient has an essential tremor and this has affected his balance which is why he uses the walker. Derm:  No itching, no rash or sores.  Endocrine:  No sweats or  chills.  No polyuria or dysuria Immunization:  Did not inquire. Travel:  None beyond local counties in last few months.    PHYSICAL EXAM: Vital signs in last 24 hours: Vitals:   09/17/16 0500 09/17/16 0952  BP: 125/77 131/61  Pulse: 75 91  Resp: 16 18  Temp: 98 F (36.7 C) 98 F (36.7 C)   Wt Readings from Last 3 Encounters:  09/17/16 86.5 kg (190 lb  11.2 oz)  09/16/16 88.5 kg (195 lb)  03/30/16 83.9 kg (185 lb)   General: Tremulous, frail, aged white male. He is comfortable. Head:  No signs of head trauma. Facies symmetric.  Eyes:  No scleral icterus, no conjunctival pallor. EOMI. Ears:  Slightly HOH.  Nose:  No discharge or congestion Mouth:  Mucous membranes slightly dry. Tongue is midline. No mucosal lesions or exudates. Neck:  No JVD, no bruits, no thyromegaly or masses. Lungs:  No cough. Minor dyspnea with prolonged speech lungs are clear bilaterally with no adventitious sounds. Heart: Underlying regular regular rhythm but frequent dropped beats.  No MRG. S1, S2 present. Abdomen:  Soft. NT, ND. No masses. No HSM.   Rectal: Deferred.   Musc/Skeltl: Kyphotic spine. No erythematous or severely deformed joints. Extremities:  No CCE.  Neurologic:  The patient is tremulous in the trunk, head and limbs. His speech is slow but accurate and easily understood. He is fully alert. Oriented 3. Skin:  No sores, no telangiectasia. Tattoos:  None observed. Nodes:  No cervical adenopathy.   Psych:  Cooperative, pleasant, not agitated or obviously depressed.  Intake/Output from previous day: 10/01 0701 - 10/02 0700 In: 345 [I.V.:345] Out: 360 [Urine:360] Intake/Output this shift: Total I/O In: 0  Out: 250 [Urine:250]  LAB RESULTS:  Recent Labs  09/16/16 1927 09/17/16 0839  WBC 7.7 6.5  HGB 10.9* 9.7*  HCT 32.2* 30.2*  PLT 217 217   BMET Lab Results  Component Value Date   NA 140 09/16/2016   NA 142 12/22/2014   K 3.7 09/16/2016   K 4.3 12/22/2014   CL 107  09/16/2016   CL 108 (H) 12/22/2014   CO2 26 09/16/2016   CO2 27 12/22/2014   GLUCOSE 172 (H) 09/16/2016   GLUCOSE 119 (H) 12/22/2014   BUN 14 09/16/2016   BUN 20 (H) 12/22/2014   CREATININE 0.76 09/16/2016   CREATININE 1.13 12/22/2014   CALCIUM 8.6 (L) 09/16/2016   CALCIUM 8.8 12/22/2014   LFT  Recent Labs  09/16/16 1927  PROT 7.3  ALBUMIN 4.2  AST 52*  ALT 46  ALKPHOS 89  BILITOT 0.5   PT/INR Lab Results  Component Value Date   INR 1.21 09/17/2016   Hepatitis Panel No results for input(s): HEPBSAG, HCVAB, HEPAIGM, HEPBIGM in the last 72 hours. C-Diff No components found for: CDIFF Lipase     Component Value Date/Time   LIPASE 23 09/16/2016 1927    Drugs of Abuse  No results found for: LABOPIA, COCAINSCRNUR, LABBENZ, AMPHETMU, THCU, LABBARB   RADIOLOGY STUDIES: Ct Abdomen Pelvis W Contrast  Result Date: 09/16/2016 CLINICAL DATA:  Mid abdominal pain, nausea, and diarrhea. History of prostate cancer and hernia repair. EXAM: CT ABDOMEN AND PELVIS WITH CONTRAST TECHNIQUE: Multidetector CT imaging of the abdomen and pelvis was performed using the standard protocol following bolus administration of intravenous contrast. CONTRAST:  134mL ISOVUE-300 IOPAMIDOL (ISOVUE-300) INJECTION 61% COMPARISON:  None. FINDINGS: Lower chest: Dependent atelectasis in the lung bases. Small Bochdalek's right diaphragmatic hernia. Coronary artery and aortic valve calcifications. Hepatobiliary: Cholelithiasis with small stone a portion of the gallbladder. No gallbladder wall thickening. No bile duct dilatation. Normal homogeneous liver parenchymal appearance without focal mass lesion in the liver. Pancreas: Unremarkable. No pancreatic ductal dilatation or surrounding inflammatory changes. Spleen: Calcified granulomas in the spleen. Spleen appears atrophic but otherwise homogeneous. Adrenals/Urinary Tract: Adrenal glands are unremarkable. Kidneys are normal, without renal calculi, focal lesion, or  hydronephrosis. Bladder is unremarkable. Stomach/Bowel: Small focal  contrast collection adjacent to the duodenum fall with surrounding edema. This likely represents a duodenal ulcer. There is also a small diverticulum of the second portion of the duodenum. Small bowel and colon are not abnormally distended. Diverticulosis of the sigmoid colon without evidence of diverticulitis. Scattered diverticula throughout the colon. The appendix is normal. There is a moderate-sized left inguinal hernia containing a portion of the sigmoid colon but without evidence of proximal obstruction. Vascular/Lymphatic: Aortic atherosclerosis. No enlarged abdominal or pelvic lymph nodes. Reproductive: Metallic structures demonstrated region of prostate gland. These could represent previous treatment with seed implants or surgical clips from prostatectomy. Other: Postoperative changes in the low anterior pelvic wall consistent with mesh hernia repair. No free air or free fluid in the abdomen. Musculoskeletal: Degenerative changes in the lumbar spine. Sternotomy wires. No destructive bone lesions. IMPRESSION: Probable ulcer of the duodenal wall with mild surrounding inflammation. 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 Left inguinal hernia containing sigmoid colon without proximal obstruction. Cholelithiasis. Electronically Signed   By: Lucienne Capers M.D.   On: 09/16/2016 22:17    ENDOSCOPIC STUDIES: Per history of present illness.  IMPRESSION:   *  Epigastric pain with CT scan showing duodenal ulcer. Previous ulcer confirmed on EGD and treatment for H. pylori greater than 10 years ago.  *  Normocytic anemia. No melena and no bleeding per rectum.   PLAN:     *  Obtain stool Hemoccult.  Check serum H. pylori antibody.    *  Continue twice-daily IV Protonix. In the past he has not tolerated PPI well but we are going to have to rechallenge him with this.  *  Dr. Silverio Decamp will be seeing the patient later today and  decide whether or not he will undergo endoscopic evaluation.  In the meantime will start clear liquids   Azucena Freed  09/17/2016, 3:00 PM Pager: [phone]

## 2016-09-18 NOTE — Care Management Obs Status (Signed)
[place] NOTIFICATION   Patient Details  Name: Robert Keith MRN: [id number] Date of Birth: [birth year]   Medicare Observation Status Notification Given:  Yes    Erenest Rasher, RN 09/18/2016, 12:30 PM

## 2016-09-18 NOTE — Interval H&P Note (Signed)
History and Physical Interval Note:  09/18/2016 1:43 PM  Robert Keith  has presented today for surgery, with the diagnosis of epigastric pain, duodenal ulcer  The various methods of treatment have been discussed with the patient and family. After consideration of risks, benefits and other options for treatment, the patient has consented to  Procedure(s): ESOPHAGOGASTRODUODENOSCOPY (EGD) WITH PROPOFOL (N/A) as a surgical intervention .  The patient's history has been reviewed, patient examined, no change in status, stable for surgery.  I have reviewed the patient's chart and labs.  Questions were answered to the patient's satisfaction.     Kavitha Nandigam

## 2016-09-18 NOTE — Transfer of Care (Signed)
Immediate Anesthesia Transfer of Care Note  Patient: Robert Keith  Procedure(s) Performed: Procedure(s): ESOPHAGOGASTRODUODENOSCOPY (EGD) WITH PROPOFOL (N/A)  Patient Location: Endoscopy Unit  Anesthesia Type:MAC  Level of Consciousness: awake, alert  and oriented  Airway & Oxygen Therapy: Patient Spontanous Breathing  Post-op Assessment: Report given to RN and Post -op Vital signs reviewed and stable  Post vital signs: Reviewed and stable  Last Vitals:  Vitals:   09/18/16 0947 09/18/16 1255  BP: 115/66 112/68  Pulse: 98 63  Resp: 18 15  Temp: 36.6 C 36.6 C    Last Pain:  Vitals:   09/18/16 1255  TempSrc: Oral  PainSc:          Complications: No apparent anesthesia complications

## 2016-09-18 NOTE — Progress Notes (Signed)
PROGRESS NOTE  Robert Keith  [id number] DOB: 01-28-1933 DOA: 09/17/2016 PCP: Leonel Ramsay, MD Outpatient Specialists:  Subjective: Had clear liquid for breakfast, nothing by mouth after that EGD later today.  Brief Narrative:  Robert Keith is a 82 y.o. male with a past medical history significant for PUD, NIDDM, HTN, CAD s/p CABG in 1996 and hx of SDH while on aspirin who presents with epigastric pain.  Over the last 10 days, the patient has had onset of slowly progressive epigastric discomfort.  This is not worse with meals or between meals, nor with position.  He is sedentary.  The pain is gnawing and located in the epigastrium, non-radiating, not associated with shortness of breath or palpitations, nor with hematemesis or hematochezia.  He has noted 4-5 "dark tarry" stools in the last week.  Has been taking "all the Pepto-Bismol you can take" without improvement.   Assessment & Plan:   Principal Problem:   Acute blood loss anemia Active Problems:   Coronary artery disease due to lipid rich plaque s/p CABG 1996   New onset atrial fibrillation (HCC)   History of subdural hematoma   Peptic ulcer disease   Type 2 diabetes mellitus with diabetic neuropathy, without long-term current use of insulin (HCC)   Essential hypertension   Acute blood loss anemia and epigastric pain/melena:  -Epigastric pain, new anemia and history of PUD as well as focal contrast enhancement in duodenum on CT. -Hemodynamically stable and minimal clinical bleeding history. -On IV Protonix, GI consulted. -EGD today, await recommendation has history of H. pylori.  New Afib:  -CHADS2Vasc 5.  Not anticoagulation candidate probably given previous SDH while on aspirin.  No previous history per patient and per Cardiology notes -Continue metoprolol, troponin is negative, recheck EKG today. -Heart rate controlled with bursts of tachycardia for now and then. The current dose of beta blockers.  NIDDM:    -Hold metformin and glipizide -SSI every 4 hours while NPO  HTN and CV secondary prevention:  -Continue amlodipine, metoprolol -Presume epigastric pain is PUD, but given new Afib.   DVT prophylaxis: SCDs Code Status: Full Code Family Communication:  Disposition Plan:  Diet: Diet NPO time specified  Consultants:   GI  Procedures:   None  Antimicrobials:   None  Objective: Vitals:   09/17/16 1723 09/17/16 2127 09/18/16 0349 09/18/16 0947  BP: 124/66 119/65 124/64 115/66  Pulse: 88 79 81 98  Resp: 18 18 20 18   Temp: 98 F (36.7 C) 98.2 F (36.8 C) 98.2 F (36.8 C) 97.9 F (36.6 C)  TempSrc: Oral Oral Oral Oral  SpO2: 98% 98% 98% 99%  Weight:      Height:        Intake/Output Summary (Last 24 hours) at 09/18/16 1159 Last data filed at 09/18/16 0900  Gross per 24 hour  Intake             1260 ml  Output             1500 ml  Net             -240 ml   Filed Weights   09/17/16 0030  Weight: 86.5 kg (190 lb 11.2 oz)    Examination: General exam: Appears calm and comfortable  Respiratory system: Clear to auscultation. Respiratory effort normal. Cardiovascular system: S1 & S2 heard, RRR. No JVD, murmurs, rubs, gallops or clicks. No pedal edema. Gastrointestinal system: Abdomen is nondistended, soft and nontender. No organomegaly or masses felt.  Normal bowel sounds heard. Central nervous system: Alert and oriented. No focal neurological deficits. Extremities: Symmetric 5 x 5 power. Skin: No rashes, lesions or ulcers Psychiatry: Judgement and insight appear normal. Mood & affect appropriate.   Data Reviewed: I have personally reviewed following labs and imaging studies  CBC:  Recent Labs Lab 09/16/16 1927 09/17/16 0839 09/18/16 0351  WBC 7.7 6.5 7.9  HGB 10.9* 9.7* 9.1*  HCT 32.2* 30.2* 29.3*  MCV 95.5 98.1 99.7  PLT 217 217 [id number]   Basic Metabolic Panel:  Recent Labs Lab 09/16/16 1927 09/17/16 0325 09/18/16 0351  NA 140  --  139  K 3.7  --   4.1  CL 107  --  109  CO2 26  --  23  GLUCOSE 172*  --  165*  BUN 14  --  6  CREATININE 0.76  --  0.79  CALCIUM 8.6*  --  8.1*  MG  --  2.2  --    GFR: Estimated Creatinine Clearance: 77.1 mL/min (by C-G formula based on SCr of 0.79 mg/dL). Liver Function Tests:  Recent Labs Lab 09/16/16 1927  AST 52*  ALT 46  ALKPHOS 89  BILITOT 0.5  PROT 7.3  ALBUMIN 4.2    Recent Labs Lab 09/16/16 1927  LIPASE 23   No results for input(s): AMMONIA in the last 168 hours. Coagulation Profile:  Recent Labs Lab 09/17/16 0325  INR 1.21   Cardiac Enzymes:  Recent Labs Lab 09/17/16 0325 09/17/16 0839 09/17/16 1431  TROPONINI <0.03 <0.03 <0.03   BNP (last 3 results) No results for input(s): PROBNP in the last 8760 hours. HbA1C: No results for input(s): HGBA1C in the last 72 hours. CBG:  Recent Labs Lab 09/17/16 1612 09/17/16 2021 09/17/16 2301 09/18/16 0337 09/18/16 0710  GLUCAP 134* 150* 72 166* 133*   Lipid Profile: No results for input(s): CHOL, HDL, LDLCALC, TRIG, CHOLHDL, LDLDIRECT in the last 72 hours. Thyroid Function Tests:  Recent Labs  09/17/16 0332  TSH 3.622   Anemia Panel: No results for input(s): VITAMINB12, FOLATE, FERRITIN, TIBC, IRON, RETICCTPCT in the last 72 hours. Urine analysis:    Component Value Date/Time   COLORURINE Yellow 12/22/2014 1430   APPEARANCEUR Clear 12/22/2014 1430   LABSPEC 1.017 12/22/2014 1430   PHURINE 5.0 12/22/2014 1430   GLUCOSEU Negative 12/22/2014 1430   HGBUR Negative 12/22/2014 1430   BILIRUBINUR Negative 12/22/2014 1430   KETONESUR Negative 12/22/2014 1430   PROTEINUR Negative 12/22/2014 1430   NITRITE Negative 12/22/2014 1430   LEUKOCYTESUR Negative 12/22/2014 1430   Sepsis Labs: @LABRCNTIP (procalcitonin:4,lacticidven:4)  )No results found for this or any previous visit (from the past 240 hour(s)).   Invalid input(s): PROCALCITONIN, LACTICACIDVEN   Radiology Studies: Ct Abdomen Pelvis W  Contrast  Result Date: 09/16/2016 CLINICAL DATA:  Mid abdominal pain, nausea, and diarrhea. History of prostate cancer and hernia repair. EXAM: CT ABDOMEN AND PELVIS WITH CONTRAST TECHNIQUE: Multidetector CT imaging of the abdomen and pelvis was performed using the standard protocol following bolus administration of intravenous contrast. CONTRAST:  169mL ISOVUE-300 IOPAMIDOL (ISOVUE-300) INJECTION 61% COMPARISON:  None. FINDINGS: Lower chest: Dependent atelectasis in the lung bases. Small Bochdalek's right diaphragmatic hernia. Coronary artery and aortic valve calcifications. Hepatobiliary: Cholelithiasis with small stone a portion of the gallbladder. No gallbladder wall thickening. No bile duct dilatation. Normal homogeneous liver parenchymal appearance without focal mass lesion in the liver. Pancreas: Unremarkable. No pancreatic ductal dilatation or surrounding inflammatory changes. Spleen: Calcified granulomas in the spleen. Spleen appears  atrophic but otherwise homogeneous. Adrenals/Urinary Tract: Adrenal glands are unremarkable. Kidneys are normal, without renal calculi, focal lesion, or hydronephrosis. Bladder is unremarkable. Stomach/Bowel: Small focal contrast collection adjacent to the duodenum fall with surrounding edema. This likely represents a duodenal ulcer. There is also a small diverticulum of the second portion of the duodenum. Small bowel and colon are not abnormally distended. Diverticulosis of the sigmoid colon without evidence of diverticulitis. Scattered diverticula throughout the colon. The appendix is normal. There is a moderate-sized left inguinal hernia containing a portion of the sigmoid colon but without evidence of proximal obstruction. Vascular/Lymphatic: Aortic atherosclerosis. No enlarged abdominal or pelvic lymph nodes. Reproductive: Metallic structures demonstrated region of prostate gland. These could represent previous treatment with seed implants or surgical clips from  prostatectomy. Other: Postoperative changes in the low anterior pelvic wall consistent with mesh hernia repair. No free air or free fluid in the abdomen. Musculoskeletal: Degenerative changes in the lumbar spine. Sternotomy wires. No destructive bone lesions. IMPRESSION: Probable ulcer of the duodenal wall with mild surrounding inflammation. 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 Left inguinal hernia containing sigmoid colon without proximal obstruction. Cholelithiasis. Electronically Signed   By: Lucienne Capers M.D.   On: 09/16/2016 22:17        Scheduled Meds: . insulin aspart  0-15 Units Subcutaneous Q4H  . metoprolol  100 mg Oral BID  . pantoprazole (PROTONIX) IV  40 mg Intravenous Q12H  . pravastatin  40 mg Oral Daily   Continuous Infusions: . 0.9 % NaCl with KCl 20 mEq / L 100 mL/hr at 09/18/16 0856     LOS: 0 days    Time spent: 35 minutes    Jelina Paulsen A, MD [facility] Pager [phone]  If 7PM-7AM, please contact night-coverage [email or web address] Password [email or web address] 09/18/2016, 11:59 AM

## 2016-09-18 NOTE — Care Management Note (Signed)
Case Management Note  Patient Details  Name: OLAJIDE BARGERSTOCK MRN: [id number] Date of Birth: 09-20-1933  Subjective/Objective:  Acute blood loss anemia,  New afib, NIDDM              Action/Plan: Discharge Planning:   NCM spoke to pt and caregiver, Judeen Hammans at bedside. Pt states he has 24 hour caregivers that is paid out of pocket. Pt is active with [facility] PT/OT. Contacted [facility] Liaison, Levada Dy to make aware of admission and orders in [facility] for resumption of care. Pt has RW, wheelchair and cane at home.   PCP- Leonel Ramsay MD   Expected Discharge Date:                Expected Discharge Plan:  [place]  In-House Referral:  NA  Discharge planning Services  CM Consult  Post Acute Care Choice:  Home Health, Resumption of Svcs/PTA Provider Choice offered to:  Patient  DME Arranged:  N/A DME Agency:  NA  HH Arranged:  PT, OT HH Agency:  NA  Status of Service:  Completed, signed off  If discussed at [place] of Stay Meetings, dates discussed:    Additional Comments:  Erenest Rasher, RN 09/18/2016, 3:06 PM

## 2016-09-18 NOTE — Anesthesia Preprocedure Evaluation (Addendum)
Anesthesia Evaluation  Patient identified by MRN, date of birth, ID band Patient awake    Reviewed: Allergy & Precautions, H&P , NPO status , Patient's Chart, lab work & pertinent test results  History of Anesthesia Complications Negative for: history of anesthetic complications  Airway Mallampati: II  TM Distance: >3 FB Neck ROM: full    Dental  (+) Teeth Intact   Pulmonary former smoker,    Pulmonary exam normal breath sounds clear to auscultation       Cardiovascular hypertension, + CAD and + CABG  Normal cardiovascular exam Rhythm:regular Rate:Normal  CABG in 1990s, no issues since, has new onset afib that was first picked up here with EKG, it is rate controlled in 60s, on metoprolol, not candidate for anticoag given hx of SDH.Marland Kitchen Has had no issues with heart since CABG.. Trop negative x3 here   Neuro/Psych History of intracranial hemorrhage    GI/Hepatic Neg liver ROS, PUD,   Endo/Other  diabetes  Renal/GU negative Renal ROS     Musculoskeletal   Abdominal   Peds  Hematology  (+) anemia ,   Anesthesia Other Findings No findings of CHF  Reproductive/Obstetrics negative OB ROS                           Anesthesia Physical Anesthesia Plan  ASA: III  Anesthesia Plan: MAC   Post-op Pain Management:    Induction: Intravenous  Airway Management Planned: Nasal Cannula  Additional Equipment:   Intra-op Plan:   Post-operative Plan:   Informed Consent: I have reviewed the patients History and Physical, chart, labs and discussed the procedure including the risks, benefits and alternatives for the proposed anesthesia with the patient or authorized representative who has indicated his/her understanding and acceptance.   Dental Advisory Given  Plan Discussed with: Anesthesiologist, CRNA and Surgeon  Anesthesia Plan Comments:         Anesthesia Quick Evaluation

## 2016-09-19 ENCOUNTER — Encounter (HOSPITAL_COMMUNITY): Payer: Self-pay | Admitting: Gastroenterology

## 2016-09-19 ENCOUNTER — Inpatient Hospital Stay (HOSPITAL_COMMUNITY): Payer: Medicare Other

## 2016-09-19 DIAGNOSIS — E1140 Type 2 diabetes mellitus with diabetic neuropathy, unspecified: Secondary | ICD-10-CM

## 2016-09-19 DIAGNOSIS — I4891 Unspecified atrial fibrillation: Secondary | ICD-10-CM

## 2016-09-19 DIAGNOSIS — K269 Duodenal ulcer, unspecified as acute or chronic, without hemorrhage or perforation: Secondary | ICD-10-CM

## 2016-09-19 DIAGNOSIS — I25810 Atherosclerosis of coronary artery bypass graft(s) without angina pectoris: Secondary | ICD-10-CM

## 2016-09-19 DIAGNOSIS — I10 Essential (primary) hypertension: Secondary | ICD-10-CM

## 2016-09-19 LAB — BASIC METABOLIC PANEL
ANION GAP: 7 (ref 5–15)
BUN: <5 mg/dL — ABNORMAL LOW (ref 6–20)
CALCIUM: 8.3 mg/dL — AB (ref 8.9–10.3)
CO2: 20 mmol/L — ABNORMAL LOW (ref 22–32)
Chloride: 112 mmol/L — ABNORMAL HIGH (ref 101–111)
Creatinine, Ser: 0.78 mg/dL (ref 0.61–1.24)
GLUCOSE: 128 mg/dL — AB (ref 65–99)
POTASSIUM: 4.1 mmol/L (ref 3.5–5.1)
Sodium: 139 mmol/L (ref 135–145)

## 2016-09-19 LAB — ECHOCARDIOGRAM COMPLETE
Height: 71 in
Weight: 3051.34 oz

## 2016-09-19 LAB — CLOTEST (H. PYLORI), BIOPSY: HELICOBACTER SCREEN: NEGATIVE

## 2016-09-19 LAB — CBC
HEMATOCRIT: 29.9 % — AB (ref 39.0–52.0)
HEMOGLOBIN: 9.4 g/dL — AB (ref 13.0–17.0)
MCH: 31.2 pg (ref 26.0–34.0)
MCHC: 31.4 g/dL (ref 30.0–36.0)
MCV: 99.3 fL (ref 78.0–100.0)
Platelets: 234 10*3/uL (ref 150–400)
RBC: 3.01 MIL/uL — AB (ref 4.22–5.81)
RDW: 14.3 % (ref 11.5–15.5)
WBC: 7.7 10*3/uL (ref 4.0–10.5)

## 2016-09-19 LAB — GLUCOSE, CAPILLARY
GLUCOSE-CAPILLARY: 120 mg/dL — AB (ref 65–99)
GLUCOSE-CAPILLARY: 166 mg/dL — AB (ref 65–99)
GLUCOSE-CAPILLARY: 183 mg/dL — AB (ref 65–99)
GLUCOSE-CAPILLARY: 88 mg/dL (ref 65–99)
Glucose-Capillary: 123 mg/dL — ABNORMAL HIGH (ref 65–99)
Glucose-Capillary: 125 mg/dL — ABNORMAL HIGH (ref 65–99)
Glucose-Capillary: 197 mg/dL — ABNORMAL HIGH (ref 65–99)

## 2016-09-19 MED ORDER — ZOLPIDEM TARTRATE 5 MG PO TABS
5.0000 mg | ORAL_TABLET | Freq: Every evening | ORAL | Status: DC | PRN
  Administered 2016-09-19 – 2016-09-20 (×3): 5 mg via ORAL
  Filled 2016-09-19 (×3): qty 1

## 2016-09-19 NOTE — Progress Notes (Addendum)
PROGRESS NOTE        PATIENT DETAILS Name: Robert Keith Age: 82 y.o. Sex: male Date of Birth: 28-Nov-1933 Admit Date: 09/17/2016 Admitting Physician Norval Morton, MD [id number], [name] Mamie Nick, MD  Brief Narrative: Patient is a 82 y.o. malewith a past medical history significant for PUD, NIDDM, HTN, CAD s/p CABG in 1996 and hx of SDH while on aspirin presented with upper GI bleed and acute blood loss anemia. GI consulted, underwent endoscopy on 10/3 which showed a nonbleeding duodenal ulcer.  Subjective: No melanotic stools today-some mild epigastric pain continues.  Assessment/Plan: Principal Problem:  Upper GI bleeding: Seems to have resolved-no melanotic stools today, EGD shows a nonbleeding duodenal ulcer. Continue PPI. GI following.  Active Problems:  Acute blood loss anemia: Secondary to above, hemoglobin although low-but with no indication for transfusion. Follow CBC  Type 2 diabetes: CBGs stable with SSI, resume oral hypoglycemics on discharge.  Atrial fibrillation with RVR: Currently rate controlled with metoprolol, although he has a CHADS2Vasc 5 and he is not a candidate for anticoagulation given GI bleeding and prior history of subdural hematoma  Hypertension: Controlled with metoprolol-follow and adjust accordingly  Dyslipidemia: Continue statin  History of CAD status post CABG (1996): Continue beta blocker and statin-not on aspirin given history of ongoing GI bleeding and history of SDH. Does not appear to be on aspirin at home as well-probably secondary to stay off SDH  History of SDH  DVT Prophylaxis: SCD's  Code Status: Full code   Family Communication: Caregiver at bedside  Disposition Plan: Remain inpatient-may require home health services on discharge-await PT evaluation  Antimicrobial agents: None  Procedures: EGD 10/3>>  CONSULTS:  GI  Time spent: 25- minutes-Greater than 50% of this time was spent in counseling,  explanation of diagnosis, planning of further management, and coordination of care.  MEDICATIONS: Anti-infectives    None      Scheduled Meds: . insulin aspart  0-15 Units Subcutaneous Q4H  . metoprolol  100 mg Oral BID  . [START ON 09/22/2016] pantoprazole  40 mg Intravenous Q12H  . pravastatin  40 mg Oral Daily   Continuous Infusions: . pantoprozole (PROTONIX) infusion 8 mg/hr (09/19/16 0258)   PRN Meds:.acetaminophen **OR** acetaminophen, ondansetron **OR** ondansetron (ZOFRAN) IV, zolpidem   PHYSICAL EXAM: Vital signs: Vitals:   09/18/16 2012 09/19/16 0234 09/19/16 0610 09/19/16 1000  BP: 126/65 128/61 (!) 116/58 118/62  Pulse: 69 78 71 76  Resp: 18 17 18 18   Temp: 98 F (36.7 C) 98.3 F (36.8 C) 98.3 F (36.8 C) 98.2 F (36.8 C)  TempSrc: Oral Oral Oral Oral  SpO2: 99% 97% 96% 96%  Weight: 86.5 kg (190 lb 11.3 oz)     Height:       Filed Weights   09/17/16 0030 09/18/16 2012  Weight: 86.5 kg (190 lb 11.2 oz) 86.5 kg (190 lb 11.3 oz)   Body mass index is 26.6 kg/m.   General appearance :Awake, alert, not in any distress. Speech Clear. Not toxic Looking Eyes:, pupils equally reactive to light and accomodation,no scleral icterus.Pink conjunctiva HEENT: Atraumatic and Normocephalic Neck: supple, no JVD. No cervical lymphadenopathy. No thyromegaly Resp:Good air entry bilaterally, no added sounds  CVS: S1 S2 irregular  GI: Bowel sounds present, Non tender and not distended with no gaurding, rigidity or rebound.No organomegaly Extremities: B/L Lower Ext shows no  edema, both legs are warm to touch Neurology:  speech clear,Non focal, sensation is grossly intact. Psychiatric: Normal judgment and insight. Alert and oriented x 3. Normal mood. Musculoskeletal:No digital cyanosis Skin:No Rash, warm and dry Wounds:N/A  I have personally reviewed following labs and imaging studies  LABORATORY DATA: CBC:  Recent Labs Lab 09/16/16 1927 09/17/16 0839 09/18/16 0351  09/19/16 0544  WBC 7.7 6.5 7.9 7.7  HGB 10.9* 9.7* 9.1* 9.4*  HCT 32.2* 30.2* 29.3* 29.9*  MCV 95.5 98.1 99.7 99.3  PLT 217 217 219 [id number]    Basic Metabolic Panel:  Recent Labs Lab 09/16/16 1927 09/17/16 0325 09/18/16 0351 09/19/16 0544  NA 140  --  139 139  K 3.7  --  4.1 4.1  CL 107  --  109 112*  CO2 26  --  23 20*  GLUCOSE 172*  --  165* 128*  BUN 14  --  6 <5*  CREATININE 0.76  --  0.79 0.78  CALCIUM 8.6*  --  8.1* 8.3*  MG  --  2.2  --   --     GFR: Estimated Creatinine Clearance: 77.1 mL/min (by C-G formula based on SCr of 0.78 mg/dL).  Liver Function Tests:  Recent Labs Lab 09/16/16 1927  AST 52*  ALT 46  ALKPHOS 89  BILITOT 0.5  PROT 7.3  ALBUMIN 4.2    Recent Labs Lab 09/16/16 1927  LIPASE 23   No results for input(s): AMMONIA in the last 168 hours.  Coagulation Profile:  Recent Labs Lab 09/17/16 0325  INR 1.21    Cardiac Enzymes:  Recent Labs Lab 09/17/16 0325 09/17/16 0839 09/17/16 1431  TROPONINI <0.03 <0.03 <0.03    BNP (last 3 results) No results for input(s): PROBNP in the last 8760 hours.  HbA1C: No results for input(s): HGBA1C in the last 72 hours.  CBG:  Recent Labs Lab 09/19/16 0019 09/19/16 0239 09/19/16 0507 09/19/16 0725 09/19/16 1142  GLUCAP 88 120* 123* 125* 166*    Lipid Profile: No results for input(s): CHOL, HDL, LDLCALC, TRIG, CHOLHDL, LDLDIRECT in the last 72 hours.  Thyroid Function Tests:  Recent Labs  09/17/16 0332  TSH 3.622    Anemia Panel: No results for input(s): VITAMINB12, FOLATE, FERRITIN, TIBC, IRON, RETICCTPCT in the last 72 hours.  Urine analysis:    Component Value Date/Time   COLORURINE Yellow 12/22/2014 1430   APPEARANCEUR Clear 12/22/2014 1430   LABSPEC 1.017 12/22/2014 1430   PHURINE 5.0 12/22/2014 1430   GLUCOSEU Negative 12/22/2014 1430   HGBUR Negative 12/22/2014 1430   BILIRUBINUR Negative 12/22/2014 1430   KETONESUR Negative 12/22/2014 1430   PROTEINUR  Negative 12/22/2014 1430   NITRITE Negative 12/22/2014 1430   LEUKOCYTESUR Negative 12/22/2014 1430    Sepsis Labs: Lactic Acid, Venous No results found for: LATICACIDVEN  MICROBIOLOGY: No results found for this or any previous visit (from the past 240 hour(s)).  RADIOLOGY STUDIES/RESULTS: Ct Abdomen Pelvis W Contrast  Result Date: 09/16/2016 CLINICAL DATA:  Mid abdominal pain, nausea, and diarrhea. History of prostate cancer and hernia repair. EXAM: CT ABDOMEN AND PELVIS WITH CONTRAST TECHNIQUE: Multidetector CT imaging of the abdomen and pelvis was performed using the standard protocol following bolus administration of intravenous contrast. CONTRAST:  167mL ISOVUE-300 IOPAMIDOL (ISOVUE-300) INJECTION 61% COMPARISON:  None. FINDINGS: Lower chest: Dependent atelectasis in the lung bases. Small Bochdalek's right diaphragmatic hernia. Coronary artery and aortic valve calcifications. Hepatobiliary: Cholelithiasis with small stone a portion of the gallbladder. No gallbladder wall thickening.  No bile duct dilatation. Normal homogeneous liver parenchymal appearance without focal mass lesion in the liver. Pancreas: Unremarkable. No pancreatic ductal dilatation or surrounding inflammatory changes. Spleen: Calcified granulomas in the spleen. Spleen appears atrophic but otherwise homogeneous. Adrenals/Urinary Tract: Adrenal glands are unremarkable. Kidneys are normal, without renal calculi, focal lesion, or hydronephrosis. Bladder is unremarkable. Stomach/Bowel: Small focal contrast collection adjacent to the duodenum fall with surrounding edema. This likely represents a duodenal ulcer. There is also a small diverticulum of the second portion of the duodenum. Small bowel and colon are not abnormally distended. Diverticulosis of the sigmoid colon without evidence of diverticulitis. Scattered diverticula throughout the colon. The appendix is normal. There is a moderate-sized left inguinal hernia containing a  portion of the sigmoid colon but without evidence of proximal obstruction. Vascular/Lymphatic: Aortic atherosclerosis. No enlarged abdominal or pelvic lymph nodes. Reproductive: Metallic structures demonstrated region of prostate gland. These could represent previous treatment with seed implants or surgical clips from prostatectomy. Other: Postoperative changes in the low anterior pelvic wall consistent with mesh hernia repair. No free air or free fluid in the abdomen. Musculoskeletal: Degenerative changes in the lumbar spine. Sternotomy wires. No destructive bone lesions. IMPRESSION: Probable ulcer of the duodenal wall with mild surrounding inflammation. 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 Left inguinal hernia containing sigmoid colon without proximal obstruction. Cholelithiasis. Electronically Signed   By: Lucienne Capers M.D.   On: 09/16/2016 22:17     LOS: 1 day   Oren Binet, MD  [facility] Pager:[phone]  If 7PM-7AM, please contact night-coverage [email or web address] Password [email or web address] 09/19/2016, 4:14 PM

## 2016-09-19 NOTE — Progress Notes (Signed)
          Daily Rounding Note  09/19/2016, 10:14 AM  LOS: 3 day   SUBJECTIVE:   Chief complaint: Epigastric pain  Pain has been resolved since before transfer from [facility] on 10/2.  He feels well.  Last stool yesterday was black.  No n/v  OBJECTIVE:         Vital signs in last 24 hours:    Temp:  [97.9 F (36.6 C)-98.3 F (36.8 C)] 98.3 F (36.8 C) (10/04 0610) Pulse Rate:  [62-78] 71 (10/04 0610) Resp:  [15-18] 18 (10/04 0610) BP: (95-128)/(54-90) 116/58 (10/04 0610) SpO2:  [96 %-100 %] 96 % (10/04 0610) Weight:  [86.5 kg (190 lb 11.3 oz)] 86.5 kg (190 lb 11.3 oz) (10/03 2012) Last BM Date: 09/16/16 Filed Weights   09/17/16 0030 09/18/16 2012  Weight: 86.5 kg (190 lb 11.2 oz) 86.5 kg (190 lb 11.3 oz)   General: aged, frail, comfortable.    Heart: RRR Chest: clear bil.  No dyspnea Abdomen: soft, NT, ND.  No mass or HSM.  Active BS  Extremities: no CCE Neuro/Psych:  Pleasant, oriented x 3 and fully alert.  Stable tremors.   Intake/Output from previous day: 10/03 0701 - 10/04 0700 In: 800 [P.O.:600; I.V.:200] Out: 676 [Urine:675; Blood:1]  Intake/Output this shift: No intake/output data recorded.  Lab Results:  Recent Labs  09/17/16 0839 09/18/16 0351 09/19/16 0544  WBC 6.5 7.9 7.7  HGB 9.7* 9.1* 9.4*  HCT 30.2* 29.3* 29.9*  PLT 217 219 234   BMET  Recent Labs  09/16/16 1927 09/18/16 0351 09/19/16 0544  NA 140 139 139  K 3.7 4.1 4.1  CL 107 109 112*  CO2 26 23 20*  GLUCOSE 172* 165* 128*  BUN 14 6 <5*  CREATININE 0.76 0.79 0.78  CALCIUM 8.6* 8.1* 8.3*   LFT  Recent Labs  09/16/16 1927  PROT 7.3  ALBUMIN 4.2  AST 52*  ALT 46  ALKPHOS 89  BILITOT 0.5   PT/INR  Recent Labs  09/17/16 0325  LABPROT 15.4*  INR 1.21   Hepatitis Panel No results for input(s): HEPBSAG, HCVAB, HEPAIGM, HEPBIGM in the last 72 hours.  Studies/Results: No results found.  Scheduled Meds: . insulin aspart   0-15 Units Subcutaneous Q4H  . metoprolol  100 mg Oral BID  . [START ON 09/22/2016] pantoprazole  40 mg Intravenous Q12H  . pravastatin  40 mg Oral Daily   Continuous Infusions: . pantoprozole (PROTONIX) infusion 8 mg/hr (09/19/16 0258)   PRN Meds:.acetaminophen **OR** acetaminophen, ondansetron **OR** ondansetron (ZOFRAN) IV, zolpidem   ASSESMENT:   *  Epigastric pain with CT scan showing duodenal ulcer. Previous ulcer confirmed on EGD and treatment for H. pylori greater than 10 years ago.  Serum H Pylori Ag is <0.9: negative.  10/3 EGD: non-bleeding, pigmented duodenal ulcer, duodenal stenosis.  Clotest:   *  Normocytic anemia. Hgb stable. No melena and no bleeding per rectum. No transfusion to date.   *  DM.     PLAN   *  Soft diet through ~ 10/28.  Complete 72 hours of PPI infusion.  End time is 10/6 at 1000 If stable does he need to stay for completion of infusion or can he discharge on BID PPI?     Azucena Freed  09/19/2016, 10:14 AM Pager: [phone]

## 2016-09-20 DIAGNOSIS — K922 Gastrointestinal hemorrhage, unspecified: Secondary | ICD-10-CM

## 2016-09-20 LAB — CBC
HEMATOCRIT: 29.1 % — AB (ref 39.0–52.0)
HEMOGLOBIN: 9.2 g/dL — AB (ref 13.0–17.0)
MCH: 31.3 pg (ref 26.0–34.0)
MCHC: 31.6 g/dL (ref 30.0–36.0)
MCV: 99 fL (ref 78.0–100.0)
Platelets: 231 10*3/uL (ref 150–400)
RBC: 2.94 MIL/uL — AB (ref 4.22–5.81)
RDW: 14.3 % (ref 11.5–15.5)
WBC: 7.5 10*3/uL (ref 4.0–10.5)

## 2016-09-20 LAB — BASIC METABOLIC PANEL
ANION GAP: 13 (ref 5–15)
BUN: 5 mg/dL — ABNORMAL LOW (ref 6–20)
CALCIUM: 8.4 mg/dL — AB (ref 8.9–10.3)
CO2: 23 mmol/L (ref 22–32)
Chloride: 103 mmol/L (ref 101–111)
Creatinine, Ser: 0.83 mg/dL (ref 0.61–1.24)
Glucose, Bld: 148 mg/dL — ABNORMAL HIGH (ref 65–99)
POTASSIUM: 3.9 mmol/L (ref 3.5–5.1)
Sodium: 139 mmol/L (ref 135–145)

## 2016-09-20 LAB — GLUCOSE, CAPILLARY
GLUCOSE-CAPILLARY: 134 mg/dL — AB (ref 65–99)
GLUCOSE-CAPILLARY: 144 mg/dL — AB (ref 65–99)
GLUCOSE-CAPILLARY: 148 mg/dL — AB (ref 65–99)
GLUCOSE-CAPILLARY: 160 mg/dL — AB (ref 65–99)
GLUCOSE-CAPILLARY: 196 mg/dL — AB (ref 65–99)
GLUCOSE-CAPILLARY: 214 mg/dL — AB (ref 65–99)

## 2016-09-20 NOTE — Progress Notes (Signed)
PROGRESS NOTE        PATIENT DETAILS Name: Robert Keith Age: 82 y.o. Sex: male Date of Birth: 1933-03-27 Admit Date: 09/17/2016 Admitting Physician Norval Morton, MD [id number], [name] Mamie Nick, MD  Brief Narrative: Patient is a 82 y.o. malewith a past medical history significant for PUD, NIDDM, HTN, CAD s/p CABG in 1996 and hx of SDH while on aspirin presented with upper GI bleed and acute blood loss anemia. GI consulted, underwent endoscopy on 10/3 which showed a nonbleeding duodenal ulcer.  Subjective: No melanotic stools today-some mild epigastric pain continues.  Assessment/Plan: Principal Problem:  Upper GI bleeding: Seems to have resolved-no melanotic stools today, EGD shows a nonbleeding duodenal ulcer. Continue PPI infusion until 10/6. GI following.  Active Problems:  Acute blood loss anemia: Secondary to above, hemoglobin although low-but with no indication for transfusion. Follow CBC  Type 2 diabetes: CBGs stable with SSI, resume oral hypoglycemics on discharge.  Atrial fibrillation with RVR: Currently rate controlled with metoprolol, although he has a CHADS2Vasc 5 and he is not a candidate for anticoagulation given GI bleeding and prior history of subdural hematoma  Hypertension: Controlled with metoprolol-follow and adjust accordingly  Dyslipidemia: Continue statin  History of CAD status post CABG (1996): Continue beta blocker and statin-not on aspirin given history of ongoing GI bleeding and history of SDH. Does not appear to be on aspirin at home as well-probably secondary to stay off SDH  History of SDH  DVT Prophylaxis: SCD's  Code Status: Full code   Family Communication: None at bedside  Disposition Plan: Remain inpatient-may require home health services on discharge-await PT evaluation-likely d/c 10/6  Antimicrobial agents: None  Procedures: EGD 10/3>>  CONSULTS:  GI  Time spent: 25- minutes-Greater than 50% of this  time was spent in counseling, explanation of diagnosis, planning of further management, and coordination of care.  MEDICATIONS: Anti-infectives    None      Scheduled Meds: . insulin aspart  0-15 Units Subcutaneous Q4H  . metoprolol  100 mg Oral BID  . pravastatin  40 mg Oral Daily   Continuous Infusions: . pantoprozole (PROTONIX) infusion 8 mg/hr (09/20/16 1144)   PRN Meds:.acetaminophen **OR** acetaminophen, ondansetron **OR** ondansetron (ZOFRAN) IV, zolpidem   PHYSICAL EXAM: Vital signs: Vitals:   09/19/16 1704 09/19/16 2055 09/20/16 0434 09/20/16 1023  BP: 123/60 (!) 141/85 (!) 150/86 119/68  Pulse: 82 91 88 98  Resp: 18 18 18 18   Temp: 98 F (36.7 C) 98.3 F (36.8 C) 98 F (36.7 C) 98.8 F (37.1 C)  TempSrc: Oral Oral Oral Oral  SpO2: 98% 98% 98% 98%  Weight:  87 kg (191 lb 12.8 oz)    Height:       Filed Weights   09/17/16 0030 09/18/16 2012 09/19/16 2055  Weight: 86.5 kg (190 lb 11.2 oz) 86.5 kg (190 lb 11.3 oz) 87 kg (191 lb 12.8 oz)   Body mass index is 26.75 kg/m.   General appearance :Awake, alert, not in any distress. Speech Clear. Not toxic Looking Eyes:, pupils equally reactive to light and accomodation,no scleral icterus.Pink conjunctiva HEENT: Atraumatic and Normocephalic Neck: supple, no JVD. No cervical lymphadenopathy. No thyromegaly Resp:Good air entry bilaterally, no added sounds  CVS: S1 S2 irregular  GI: Bowel sounds present, Non tender and not distended with no gaurding, rigidity or rebound.No organomegaly Extremities: B/L Lower  Ext shows no edema, both legs are warm to touch Neurology:  speech clear,Non focal, sensation is grossly intact. Psychiatric: Normal judgment and insight. Alert and oriented x 3. Normal mood. Musculoskeletal:No digital cyanosis Skin:No Rash, warm and dry Wounds:N/A  I have personally reviewed following labs and imaging studies  LABORATORY DATA: CBC:  Recent Labs Lab 09/16/16 1927 09/17/16 0839  09/18/16 0351 09/19/16 0544 09/20/16 0524  WBC 7.7 6.5 7.9 7.7 7.5  HGB 10.9* 9.7* 9.1* 9.4* 9.2*  HCT 32.2* 30.2* 29.3* 29.9* 29.1*  MCV 95.5 98.1 99.7 99.3 99.0  PLT 217 217 219 234 [id number]    Basic Metabolic Panel:  Recent Labs Lab 09/16/16 1927 09/17/16 0325 09/18/16 0351 09/19/16 0544 09/20/16 0524  NA 140  --  139 139 139  K 3.7  --  4.1 4.1 3.9  CL 107  --  109 112* 103  CO2 26  --  23 20* 23  GLUCOSE 172*  --  165* 128* 148*  BUN 14  --  6 <5* 5*  CREATININE 0.76  --  0.79 0.78 0.83  CALCIUM 8.6*  --  8.1* 8.3* 8.4*  MG  --  2.2  --   --   --     GFR: Estimated Creatinine Clearance: 74.3 mL/min (by C-G formula based on SCr of 0.83 mg/dL).  Liver Function Tests:  Recent Labs Lab 09/16/16 1927  AST 52*  ALT 46  ALKPHOS 89  BILITOT 0.5  PROT 7.3  ALBUMIN 4.2    Recent Labs Lab 09/16/16 1927  LIPASE 23   No results for input(s): AMMONIA in the last 168 hours.  Coagulation Profile:  Recent Labs Lab 09/17/16 0325  INR 1.21    Cardiac Enzymes:  Recent Labs Lab 09/17/16 0325 09/17/16 0839 09/17/16 1431  TROPONINI <0.03 <0.03 <0.03    BNP (last 3 results) No results for input(s): PROBNP in the last 8760 hours.  HbA1C: No results for input(s): HGBA1C in the last 72 hours.  CBG:  Recent Labs Lab 09/19/16 2000 09/20/16 0012 09/20/16 0413 09/20/16 0741 09/20/16 1219  GLUCAP 183* 160* 144* 148* 196*    Lipid Profile: No results for input(s): CHOL, HDL, LDLCALC, TRIG, CHOLHDL, LDLDIRECT in the last 72 hours.  Thyroid Function Tests: No results for input(s): TSH, T4TOTAL, FREET4, T3FREE, THYROIDAB in the last 72 hours.  Anemia Panel: No results for input(s): VITAMINB12, FOLATE, FERRITIN, TIBC, IRON, RETICCTPCT in the last 72 hours.  Urine analysis:    Component Value Date/Time   COLORURINE Yellow 12/22/2014 1430   APPEARANCEUR Clear 12/22/2014 1430   LABSPEC 1.017 12/22/2014 1430   PHURINE 5.0 12/22/2014 1430   GLUCOSEU  Negative 12/22/2014 1430   HGBUR Negative 12/22/2014 1430   BILIRUBINUR Negative 12/22/2014 1430   KETONESUR Negative 12/22/2014 1430   PROTEINUR Negative 12/22/2014 1430   NITRITE Negative 12/22/2014 1430   LEUKOCYTESUR Negative 12/22/2014 1430    Sepsis Labs: Lactic Acid, Venous No results found for: LATICACIDVEN  MICROBIOLOGY: No results found for this or any previous visit (from the past 240 hour(s)).  RADIOLOGY STUDIES/RESULTS: Ct Abdomen Pelvis W Contrast  Result Date: 09/16/2016 CLINICAL DATA:  Mid abdominal pain, nausea, and diarrhea. History of prostate cancer and hernia repair. EXAM: CT ABDOMEN AND PELVIS WITH CONTRAST TECHNIQUE: Multidetector CT imaging of the abdomen and pelvis was performed using the standard protocol following bolus administration of intravenous contrast. CONTRAST:  17mL ISOVUE-300 IOPAMIDOL (ISOVUE-300) INJECTION 61% COMPARISON:  None. FINDINGS: Lower chest: Dependent atelectasis in the lung  bases. Small Bochdalek's right diaphragmatic hernia. Coronary artery and aortic valve calcifications. Hepatobiliary: Cholelithiasis with small stone a portion of the gallbladder. No gallbladder wall thickening. No bile duct dilatation. Normal homogeneous liver parenchymal appearance without focal mass lesion in the liver. Pancreas: Unremarkable. No pancreatic ductal dilatation or surrounding inflammatory changes. Spleen: Calcified granulomas in the spleen. Spleen appears atrophic but otherwise homogeneous. Adrenals/Urinary Tract: Adrenal glands are unremarkable. Kidneys are normal, without renal calculi, focal lesion, or hydronephrosis. Bladder is unremarkable. Stomach/Bowel: Small focal contrast collection adjacent to the duodenum fall with surrounding edema. This likely represents a duodenal ulcer. There is also a small diverticulum of the second portion of the duodenum. Small bowel and colon are not abnormally distended. Diverticulosis of the sigmoid colon without evidence  of diverticulitis. Scattered diverticula throughout the colon. The appendix is normal. There is a moderate-sized left inguinal hernia containing a portion of the sigmoid colon but without evidence of proximal obstruction. Vascular/Lymphatic: Aortic atherosclerosis. No enlarged abdominal or pelvic lymph nodes. Reproductive: Metallic structures demonstrated region of prostate gland. These could represent previous treatment with seed implants or surgical clips from prostatectomy. Other: Postoperative changes in the low anterior pelvic wall consistent with mesh hernia repair. No free air or free fluid in the abdomen. Musculoskeletal: Degenerative changes in the lumbar spine. Sternotomy wires. No destructive bone lesions. IMPRESSION: Probable ulcer of the duodenal wall with mild surrounding inflammation. 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 Left inguinal hernia containing sigmoid colon without proximal obstruction. Cholelithiasis. Electronically Signed   By: Lucienne Capers M.D.   On: 09/16/2016 22:17     LOS: 2 days   Oren Binet, MD  [facility] Pager:[phone]  If 7PM-7AM, please contact night-coverage [email or web address] Password [email or web address] 09/20/2016, 2:14 PM

## 2016-09-20 NOTE — Progress Notes (Signed)
Daily Rounding Note  09/20/2016, 10:59 AM  LOS: 2 days   SUBJECTIVE:   Chief complaint: epigastric pain: resolved  1 black stool yesterday afternoon, no BM since.  Patient feels well. He is not dizzy.  Tolerating soft diet. For unclear reasons, patient's Protonix drip had not been continued after some time yesterday morning, though it was supposed to be ongoing through 09/21/16 at 1500. This appears to have been a nursing error as the medication was never discontinued.  OBJECTIVE:         Vital signs in last 24 hours:    Temp:  [98 F (36.7 C)-98.8 F (37.1 C)] 98.8 F (37.1 C) (10/05 1023) Pulse Rate:  [82-98] 98 (10/05 1023) Resp:  [18] 18 (10/05 1023) BP: (119-150)/(60-86) 119/68 (10/05 1023) SpO2:  [98 %] 98 % (10/05 1023) Weight:  [87 kg (191 lb 12.8 oz)] 87 kg (191 lb 12.8 oz) (10/04 2055) Last BM Date: 09/16/16 Filed Weights   09/17/16 0030 09/18/16 2012 09/19/16 2055  Weight: 86.5 kg (190 lb 11.2 oz) 86.5 kg (190 lb 11.3 oz) 87 kg (191 lb 12.8 oz)   General: Pleasant, comfortable, frail appearing.   Heart: RRR. Chest: Clear bilaterally. No dyspnea or cough. Abdomen: Soft. Active bowel sounds. Not tender or distended.  Extremities: No CCE. Neuro/Psych:  HOH. Moves all 4 limbs. Stable resting, essential tremor of the limbs and head.  Intake/Output from previous day: 10/04 0701 - 10/05 0700 In: 1727.5 [P.O.:1080; I.V.:647.5] Out: 2400 [Urine:2400]  Intake/Output this shift: Total I/O In: 220 [P.O.:220] Out: 400 [Urine:400]  Lab Results:  Recent Labs  09/18/16 0351 09/19/16 0544 09/20/16 0524  WBC 7.9 7.7 7.5  HGB 9.1* 9.4* 9.2*  HCT 29.3* 29.9* 29.1*  PLT 219 234 231   BMET  Recent Labs  09/18/16 0351 09/19/16 0544 09/20/16 0524  NA 139 139 139  K 4.1 4.1 3.9  CL 109 112* 103  CO2 23 20* 23  GLUCOSE 165* 128* 148*  BUN 6 <5* 5*  CREATININE 0.79 0.78 0.83  CALCIUM 8.1* 8.3* 8.4*    LFT No results for input(s): PROT, ALBUMIN, AST, ALT, ALKPHOS, BILITOT, BILIDIR, IBILI in the last 72 hours. PT/INR No results for input(s): LABPROT, INR in the last 72 hours. Hepatitis Panel No results for input(s): HEPBSAG, HCVAB, HEPAIGM, HEPBIGM in the last 72 hours.  Studies/Results: No results found.   Scheduled Meds: . insulin aspart  0-15 Units Subcutaneous Q4H  . metoprolol  100 mg Oral BID  . [START ON 09/22/2016] pantoprazole  40 mg Intravenous Q12H  . pravastatin  40 mg Oral Daily   Continuous Infusions: . pantoprozole (PROTONIX) infusion 8 mg/hr (09/19/16 0258)   PRN Meds:.acetaminophen **OR** acetaminophen, ondansetron **OR** ondansetron (ZOFRAN) IV, zolpidem   ASSESMENT:   * Epigastric pain, GI bleed with CT scan showing duodenal ulcer. Ulcer on EGD and treatment for H. pylori greater than 10 years ago.  Serum H Pylori Ag is <0.9: negative.  10/3 EGD: non-bleeding, pigmented duodenal ulcer, duodenal stenosis.  Clotest: negative.  Medication error with PPI drip discontinued sometime yesterday morning.   Stools still black.  * Normocytic anemia. Hgb stable. No transfusion to date.   *  DM.      PLAN   *  Per Dr. Jillyn Hidden wishes restart the Protonix drip through the proposed time of 1500 or thereabouts tomorrow ( if there is still remaining PPI as of 1500 tomorrow he should complete whatever remains  of that bag) . Thereafter twice daily PPI. CBC in the morning   Azucena Freed  09/20/2016, 10:59 AM Pager: [phone]

## 2016-09-20 NOTE — Progress Notes (Signed)
PT Cancellation Note  Patient Details Name: Robert Keith MRN: [id number] DOB: 1933-05-23   Cancelled Treatment:    Reason Eval/Treat Not Completed: Patient declined, no reason specified.  Patient states "I'm too weak to get up" "I sit at the edge of the bed for meals"  "I can get up and move around when I am at home."  Will return tomorrow to attempt PT evaluation.  Patient was receiving HHPT/OT pta.  Would recommend that these services continue at d/c.   Despina Pole 09/20/2016, 3:36 PM Carita Pian. Sanjuana Kava, [place] Pager [phone]

## 2016-09-21 DIAGNOSIS — I2510 Atherosclerotic heart disease of native coronary artery without angina pectoris: Secondary | ICD-10-CM

## 2016-09-21 DIAGNOSIS — I2583 Coronary atherosclerosis due to lipid rich plaque: Secondary | ICD-10-CM

## 2016-09-21 LAB — CBC
HCT: 30.6 % — ABNORMAL LOW (ref 39.0–52.0)
Hemoglobin: 9.5 g/dL — ABNORMAL LOW (ref 13.0–17.0)
MCH: 30.7 pg (ref 26.0–34.0)
MCHC: 31 g/dL (ref 30.0–36.0)
MCV: 99 fL (ref 78.0–100.0)
Platelets: 241 10*3/uL (ref 150–400)
RBC: 3.09 MIL/uL — ABNORMAL LOW (ref 4.22–5.81)
RDW: 14.3 % (ref 11.5–15.5)
WBC: 5.3 10*3/uL (ref 4.0–10.5)

## 2016-09-21 LAB — GLUCOSE, CAPILLARY
GLUCOSE-CAPILLARY: 197 mg/dL — AB (ref 65–99)
Glucose-Capillary: 141 mg/dL — ABNORMAL HIGH (ref 65–99)
Glucose-Capillary: 164 mg/dL — ABNORMAL HIGH (ref 65–99)

## 2016-09-21 MED ORDER — PANTOPRAZOLE SODIUM 40 MG PO TBEC: 40.0000 mg | DELAYED_RELEASE_TABLET | Freq: Two times a day (BID) | ORAL | Status: DC

## 2016-09-21 MED ORDER — PANTOPRAZOLE SODIUM 40 MG PO TBEC: 40.0000 mg | DELAYED_RELEASE_TABLET | Freq: Two times a day (BID) | ORAL | 0 refills | Status: DC

## 2016-09-21 NOTE — Care Management Important Message (Signed)
Important Message  Patient Details  Name: Robert Keith MRN: [id number] Date of Birth: 05/11/1933   Medicare Important Message Given:  Yes    Erenest Rasher, RN 09/21/2016, 10:13 AM

## 2016-09-21 NOTE — Discharge Summary (Signed)
PATIENT DETAILS Name: Robert Keith Age: 81 y.o. Sex: male Date of Birth: 01-05-1933 MRN: [id number]. Admitting Physician: Norval Morton, MD [id number], Cheral Marker, MD  Admit Date: 09/17/2016 Discharge date: 09/21/2016  Recommendations for Outpatient Follow-up:  1. Follow up with PCP in 1-2 weeks 2. Please obtain BMP/CBC in one week   Admitted From:  Home with home health services  Disposition: Home with home health [place]: Yes  Equipment/Devices: None  Discharge Condition: Stable  CODE STATUS: FULL CODE  Diet recommendation:  Heart Healthy / Carb Modified  Brief Summary: See H&P, Labs, Consult and Test reports for all details in brief, patient is a 82 y.o. malewith a past medical history significant for PUD, NIDDM, HTN, CAD s/p CABG in 1996 and hx of SDH while on aspirin presented with upper GI bleed and acute blood loss anemia. GI consulted, underwent endoscopy on 10/3 which showed a nonbleeding duodenal ulcer.  Brief Hospital Course: Upper GI bleeding: Seems to have resolved-no melanotic stools today, EGD shows a nonbleeding duodenal ulcer. Initially placed on PPI infusion, now transitioned to PPI twice a day. Per GI, okay to discharge. Hemoglobin stable.   Active Problems:  Acute blood loss anemia: Secondary to above, never required blood transfusion. Hemoglobin persistently stable in the mid 9's range. Suggest repeat CBC in the outpatient setting in the next 1-2 weeks  Type 2 diabetes: CBGs stable with SSI, resume oral hypoglycemics on discharge.  Atrial fibrillation with RVR: Currently rate controlled with metoprolol, although he has a CHADS2Vasc 5 and he is not a candidate for anticoagulation given GI bleeding and prior history of subdural hematoma  Hypertension: Controlled with metoprolol-follow and adjust accordingly in the outpatient setting  Dyslipidemia: Continue statin  History of CAD status post CABG (1996): Continue beta  blocker and statin-not on aspirin given history of ongoing GI bleeding and history of SDH. Does not appear to be on aspirin at home as well-probably secondary to stay off SDH  History of SDH  Procedures/Studies: EGD 10/3>>  Discharge Diagnoses:  Principal Problem:   Acute blood loss anemia Active Problems:   Coronary artery disease due to lipid rich plaque s/p CABG 1996   New onset atrial fibrillation ([name])   History of subdural hematoma   Peptic ulcer disease   Type 2 diabetes mellitus with diabetic neuropathy, without long-term current use of insulin (HCC)   Essential hypertension   Duodenal ulcer disease   Discharge Instructions:  Activity:  As tolerated with Full fall precautions use walker/cane & assistance as needed  Discharge Instructions    Call MD for:  extreme fatigue    Complete by:  As directed    Diet - low sodium heart healthy    Complete by:  As directed    Diet Carb Modified    Complete by:  As directed    Discharge instructions    Complete by:  As directed    Follow with Primary MD  Ola Spurr, [name] Mamie Nick, MD  and other consultant's as instructed your Hospitalist MD  Please get a complete blood count and chemistry panel checked by your Primary MD at your next visit, and again as instructed by your Primary MD.  Get Medicines reviewed and adjusted: Please take all your medications with you for your next visit with your Primary MD  Laboratory/radiological data: Please request your Primary MD to go over all hospital tests and procedure/radiological results at the follow up, please ask your Primary MD to get  all Hospital records sent to his/her office.  In some cases, they will be blood work, cultures and biopsy results pending at the time of your discharge. Please request that your primary care M.D. follows up on these results.  Also Note the following: If you experience worsening of your admission symptoms, develop shortness of breath, life threatening  emergency, suicidal or homicidal thoughts you must seek medical attention immediately by calling 911 or calling your MD immediately  if symptoms less severe.  You must read complete instructions/literature along with all the possible adverse reactions/side effects for all the Medicines you take and that have been prescribed to you. Take any new Medicines after you have completely understood and accpet all the possible adverse reactions/side effects.   Do not drive when taking Pain medications or sleeping medications (Benzodaizepines)  Do not take more than prescribed Pain, Sleep and Anxiety Medications. It is not advisable to combine anxiety,sleep and pain medications without talking with your primary care practitioner  Special Instructions: If you have smoked or chewed Tobacco  in the last 2 yrs please stop smoking, stop any regular Alcohol  and or any Recreational drug use.  Wear Seat belts while driving.  Please note: You were cared for by a hospitalist during your hospital stay. Once you are discharged, your primary care physician will handle any further medical issues. Please note that NO REFILLS for any discharge medications will be authorized once you are discharged, as it is imperative that you return to your primary care physician (or establish a relationship with a primary care physician if you do not have one) for your post hospital discharge needs so that they can reassess your need for medications and monitor your lab values.   Increase activity slowly    Complete by:  As directed        Medication List    STOP taking these medications   famotidine 20 MG tablet Commonly known as:  PEPCID   propranolol 40 MG tablet Commonly known as:  INDERAL     TAKE these medications   glipiZIDE 5 MG tablet Commonly known as:  GLUCOTROL Take 5 mg by mouth daily.   metFORMIN 1000 MG tablet Commonly known as:  GLUCOPHAGE Take 1,000 mg by mouth daily.   metoprolol 100 MG  tablet Commonly known as:  LOPRESSOR Take 50-100 mg by mouth See admin instructions. Take 100 mg by mouth in the morning and take 50 mg by mouth at lunch   pantoprazole 40 MG tablet Commonly known as:  PROTONIX Take 1 tablet (40 mg total) by mouth 2 (two) times daily before a meal.   pravastatin 40 MG tablet Commonly known as:  PRAVACHOL Take 40 mg by mouth daily.      Follow-up [place] .   Specialty:  Home Health Services Why:  [place] Number # [phone] Home Health Physical Therapy and Occupational Therapy Contact information: [place] [address] [phone]        Leonel Ramsay, MD. Schedule an appointment as soon as possible for a visit in 1 week(s).   Specialty:  Infectious Diseases Contact information: [place] [address] [phone]          Allergies  Allergen Reactions  . Bupropion Other (See Comments)    Unknown  . Fenofibrate Micronized Other (See Comments)    Unknown  . Gemfibrozil  Other (See Comments)    Unknown  . Niacin Other (See Comments)    impotence  . Phenobarbital Other (See Comments)    confusion  . Simvastatin Other (See Comments)    Myalgias    Consultations:   GI  Other Procedures/Studies: Ct Abdomen Pelvis W Contrast  Result Date: 09/16/2016 CLINICAL DATA:  Mid abdominal pain, nausea, and diarrhea. History of prostate cancer and hernia repair. EXAM: CT ABDOMEN AND PELVIS WITH CONTRAST TECHNIQUE: Multidetector CT imaging of the abdomen and pelvis was performed using the standard protocol following bolus administration of intravenous contrast. CONTRAST:  177mL ISOVUE-300 IOPAMIDOL (ISOVUE-300) INJECTION 61% COMPARISON:  None. FINDINGS: Lower chest: Dependent atelectasis in the lung bases. Small Bochdalek's right diaphragmatic hernia. Coronary artery and aortic valve  calcifications. Hepatobiliary: Cholelithiasis with small stone a portion of the gallbladder. No gallbladder wall thickening. No bile duct dilatation. Normal homogeneous liver parenchymal appearance without focal mass lesion in the liver. Pancreas: Unremarkable. No pancreatic ductal dilatation or surrounding inflammatory changes. Spleen: Calcified granulomas in the spleen. Spleen appears atrophic but otherwise homogeneous. Adrenals/Urinary Tract: Adrenal glands are unremarkable. Kidneys are normal, without renal calculi, focal lesion, or hydronephrosis. Bladder is unremarkable. Stomach/Bowel: Small focal contrast collection adjacent to the duodenum fall with surrounding edema. This likely represents a duodenal ulcer. There is also a small diverticulum of the second portion of the duodenum. Small bowel and colon are not abnormally distended. Diverticulosis of the sigmoid colon without evidence of diverticulitis. Scattered diverticula throughout the colon. The appendix is normal. There is a moderate-sized left inguinal hernia containing a portion of the sigmoid colon but without evidence of proximal obstruction. Vascular/Lymphatic: Aortic atherosclerosis. No enlarged abdominal or pelvic lymph nodes. Reproductive: Metallic structures demonstrated region of prostate gland. These could represent previous treatment with seed implants or surgical clips from prostatectomy. Other: Postoperative changes in the low anterior pelvic wall consistent with mesh hernia repair. No free air or free fluid in the abdomen. Musculoskeletal: Degenerative changes in the lumbar spine. Sternotomy wires. No destructive bone lesions. IMPRESSION: Probable ulcer of the duodenal wall with mild surrounding inflammation. 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 Left inguinal hernia containing sigmoid colon without proximal obstruction. Cholelithiasis. Electronically Signed   By: Lucienne Capers M.D.   On: 09/16/2016 22:17      TODAY-DAY  OF DISCHARGE:  Subjective:   Robert Keith today has no headache,no chest abdominal pain,no new weakness tingling or numbness, feels much better wants to go home today.   Objective:   Blood pressure 103/64, pulse (!) 115, temperature 98.2 F (36.8 C), resp. rate 19, height 5\' 11"  (1.803 m), weight 83 kg (182 lb 15.7 oz), SpO2 96 %.  Intake/Output Summary (Last 24 hours) at 09/21/16 1008 Last data filed at 09/21/16 0600  Gross per 24 hour  Intake          2211.17 ml  Output             1300 ml  Net           911.17 ml   Filed Weights   09/18/16 2012 09/19/16 2055 09/20/16 2007  Weight: 86.5 kg (190 lb 11.3 oz) 87 kg (191 lb 12.8 oz) 83 kg (182 lb 15.7 oz)    Exam: Awake Alert, Oriented *3, No new F.N deficits, Normal affect [place].AT,PERRAL Supple Neck,No JVD, No cervical lymphadenopathy appriciated.  Symmetrical Chest wall movement, Good air movement bilaterally, CTAB RRR,No Gallops,Rubs or new Murmurs, No Parasternal Heave +ve B.Sounds, Abd Soft, Non tender,  No organomegaly appriciated, No rebound -guarding or rigidity. No Cyanosis, Clubbing or edema, No new Rash or bruise   PERTINENT RADIOLOGIC STUDIES: Ct Abdomen Pelvis W Contrast  Result Date: 09/16/2016 CLINICAL DATA:  Mid abdominal pain, nausea, and diarrhea. History of prostate cancer and hernia repair. EXAM: CT ABDOMEN AND PELVIS WITH CONTRAST TECHNIQUE: Multidetector CT imaging of the abdomen and pelvis was performed using the standard protocol following bolus administration of intravenous contrast. CONTRAST:  128mL ISOVUE-300 IOPAMIDOL (ISOVUE-300) INJECTION 61% COMPARISON:  None. FINDINGS: Lower chest: Dependent atelectasis in the lung bases. Small Bochdalek's right diaphragmatic hernia. Coronary artery and aortic valve calcifications. Hepatobiliary: Cholelithiasis with small stone a portion of the gallbladder. No gallbladder wall thickening. No bile duct dilatation. Normal homogeneous liver parenchymal appearance without  focal mass lesion in the liver. Pancreas: Unremarkable. No pancreatic ductal dilatation or surrounding inflammatory changes. Spleen: Calcified granulomas in the spleen. Spleen appears atrophic but otherwise homogeneous. Adrenals/Urinary Tract: Adrenal glands are unremarkable. Kidneys are normal, without renal calculi, focal lesion, or hydronephrosis. Bladder is unremarkable. Stomach/Bowel: Small focal contrast collection adjacent to the duodenum fall with surrounding edema. This likely represents a duodenal ulcer. There is also a small diverticulum of the second portion of the duodenum. Small bowel and colon are not abnormally distended. Diverticulosis of the sigmoid colon without evidence of diverticulitis. Scattered diverticula throughout the colon. The appendix is normal. There is a moderate-sized left inguinal hernia containing a portion of the sigmoid colon but without evidence of proximal obstruction. Vascular/Lymphatic: Aortic atherosclerosis. No enlarged abdominal or pelvic lymph nodes. Reproductive: Metallic structures demonstrated region of prostate gland. These could represent previous treatment with seed implants or surgical clips from prostatectomy. Other: Postoperative changes in the low anterior pelvic wall consistent with mesh hernia repair. No free air or free fluid in the abdomen. Musculoskeletal: Degenerative changes in the lumbar spine. Sternotomy wires. No destructive bone lesions. IMPRESSION: Probable ulcer of the duodenal wall with mild surrounding inflammation. 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 Left inguinal hernia containing sigmoid colon without proximal obstruction. Cholelithiasis. Electronically Signed   By: Lucienne Capers M.D.   On: 09/16/2016 22:17     PERTINENT LAB RESULTS: CBC:  Recent Labs  09/20/16 0524 09/21/16 0426  WBC 7.5 5.3  HGB 9.2* 9.5*  HCT 29.1* 30.6*  PLT 231 241   CMET CMP     Component Value Date/Time   NA 139 09/20/2016 0524   NA  142 12/22/2014 1430   K 3.9 09/20/2016 0524   K 4.3 12/22/2014 1430   CL 103 09/20/2016 0524   CL 108 (H) 12/22/2014 1430   CO2 23 09/20/2016 0524   CO2 27 12/22/2014 1430   GLUCOSE 148 (H) 09/20/2016 0524   GLUCOSE 119 (H) 12/22/2014 1430   BUN 5 (L) 09/20/2016 0524   BUN 20 (H) 12/22/2014 1430   CREATININE 0.83 09/20/2016 0524   CREATININE 1.13 12/22/2014 1430   CALCIUM 8.4 (L) 09/20/2016 0524   CALCIUM 8.8 12/22/2014 1430   PROT 7.3 09/16/2016 1927   ALBUMIN 4.2 09/16/2016 1927   AST 52 (H) 09/16/2016 1927   ALT 46 09/16/2016 1927   ALKPHOS 89 09/16/2016 1927   BILITOT 0.5 09/16/2016 1927   GFRNONAA >60 09/20/2016 0524   GFRNONAA >60 12/22/2014 1430   GFRAA >60 09/20/2016 0524   GFRAA >60 12/22/2014 1430    GFR Estimated Creatinine Clearance: 74.3 mL/min (by C-G formula based on SCr of 0.83 mg/dL). No results for input(s): LIPASE, AMYLASE in the last  72 hours. No results for input(s): CKTOTAL, CKMB, CKMBINDEX, TROPONINI in the last 72 hours. Invalid input(s): POCBNP No results for input(s): DDIMER in the last 72 hours. No results for input(s): HGBA1C in the last 72 hours. No results for input(s): CHOL, HDL, LDLCALC, TRIG, CHOLHDL, LDLDIRECT in the last 72 hours. No results for input(s): TSH, T4TOTAL, T3FREE, THYROIDAB in the last 72 hours.  Invalid input(s): FREET3 No results for input(s): VITAMINB12, FOLATE, FERRITIN, TIBC, IRON, RETICCTPCT in the last 72 hours. Coags: No results for input(s): INR in the last 72 hours.  Invalid input(s): PT Microbiology: No results found for this or any previous visit (from the past 240 hour(s)).  FURTHER DISCHARGE INSTRUCTIONS:  Get Medicines reviewed and adjusted: Please take all your medications with you for your next visit with your Primary MD  Laboratory/radiological data: Please request your Primary MD to go over all hospital tests and procedure/radiological results at the follow up, please ask your Primary MD to get all  Hospital records sent to his/her office.  In some cases, they will be blood work, cultures and biopsy results pending at the time of your discharge. Please request that your primary care M.D. goes through all the records of your hospital data and follows up on these results.  Also Note the following: If you experience worsening of your admission symptoms, develop shortness of breath, life threatening emergency, suicidal or homicidal thoughts you must seek medical attention immediately by calling 911 or calling your MD immediately  if symptoms less severe.  You must read complete instructions/literature along with all the possible adverse reactions/side effects for all the Medicines you take and that have been prescribed to you. Take any new Medicines after you have completely understood and accpet all the possible adverse reactions/side effects.   Do not drive when taking Pain medications or sleeping medications (Benzodaizepines)  Do not take more than prescribed Pain, Sleep and Anxiety Medications. It is not advisable to combine anxiety,sleep and pain medications without talking with your primary care practitioner  Special Instructions: If you have smoked or chewed Tobacco  in the last 2 yrs please stop smoking, stop any regular Alcohol  and or any Recreational drug use.  Wear Seat belts while driving.  Please note: You were cared for by a hospitalist during your hospital stay. Once you are discharged, your primary care physician will handle any further medical issues. Please note that NO REFILLS for any discharge medications will be authorized once you are discharged, as it is imperative that you return to your primary care physician (or establish a relationship with a primary care physician if you do not have one) for your post hospital discharge needs so that they can reassess your need for medications and monitor your lab values.  Total Time spent coordinating discharge including counseling,  education and face to face time equals 45 minutes.  SignedOren Binet 09/21/2016 10:08 AM

## 2016-09-21 NOTE — Progress Notes (Signed)
Robert Keith to be D/C'd Home per MD order.  Discussed prescriptions and follow up appointments with the patient. Prescriptions given to patient, medication list explained in detail. Pt verbalized understanding.    Medication List    STOP taking these medications   famotidine 20 MG tablet Commonly known as:  PEPCID   propranolol 40 MG tablet Commonly known as:  INDERAL     TAKE these medications   glipiZIDE 5 MG tablet Commonly known as:  GLUCOTROL Take 5 mg by mouth daily.   metFORMIN 1000 MG tablet Commonly known as:  GLUCOPHAGE Take 1,000 mg by mouth daily.   metoprolol 100 MG tablet Commonly known as:  LOPRESSOR Take 50-100 mg by mouth See admin instructions. Take 100 mg by mouth in the morning and take 50 mg by mouth at lunch   pantoprazole 40 MG tablet Commonly known as:  PROTONIX Take 1 tablet (40 mg total) by mouth 2 (two) times daily before a meal.   pravastatin 40 MG tablet Commonly known as:  PRAVACHOL Take 40 mg by mouth daily.       Vitals:   09/21/16 0405 09/21/16 1018  BP: 103/64 (!) 124/56  Pulse: (!) 115 86  Resp: 19 18  Temp: 98.2 F (36.8 C) 98 F (36.7 C)    Skin clean, dry and intact without evidence of skin break down, no evidence of skin tears noted. IV catheter discontinued intact. Site without signs and symptoms of complications. Dressing and pressure applied. Pt denies pain at this time. No complaints noted.  An After Visit Summary was printed and given to the patient. Patient escorted via [place], and D/C home via private auto.  Haywood Lasso BSN, RN [facility] Phone [phone]

## 2016-09-21 NOTE — Care Management Note (Signed)
Case Management Note  Patient Details  Name: Robert Keith MRN: [id number] Date of Birth: Apr 17, 1933  Subjective/Objective:     Acute blood loss anemia,  New afib, NIDDM                          Action/Plan: Discharge Planning: AVS reviewed Please see previous NCM notes  Contacted [facility] Liaison to make aware of dc home today. [place] orders in Epic.    Expected Discharge Date:  09/21/2016              Expected Discharge Plan:  [place]  In-House Referral:  NA  Discharge planning Services  CM Consult  Post Acute Care Choice:  Home Health, Resumption of Svcs/PTA Provider Choice offered to:  Patient  DME Arranged:  N/A DME Agency:  NA  HH Arranged:  PT, OT HH Agency:  Other - See comment  Status of Service:  Completed, signed off  If discussed at [place] of Stay Meetings, dates discussed:    Additional Comments:  Erenest Rasher, RN 09/21/2016, 10:24 AM

## 2016-10-09 ENCOUNTER — Ambulatory Visit: Payer: Medicare Other | Admitting: Internal Medicine

## 2016-11-07 ENCOUNTER — Encounter: Payer: Self-pay | Admitting: Cardiovascular Disease

## 2016-11-07 ENCOUNTER — Ambulatory Visit (INDEPENDENT_AMBULATORY_CARE_PROVIDER_SITE_OTHER): Payer: Medicare Other | Admitting: Cardiovascular Disease

## 2016-11-07 VITALS — BP 110/66 | HR 74 | Ht 72.0 in | Wt 197.8 lb

## 2016-11-07 DIAGNOSIS — I10 Essential (primary) hypertension: Secondary | ICD-10-CM | POA: Diagnosis not present

## 2016-11-07 DIAGNOSIS — K922 Gastrointestinal hemorrhage, unspecified: Secondary | ICD-10-CM

## 2016-11-07 DIAGNOSIS — I482 Chronic atrial fibrillation, unspecified: Secondary | ICD-10-CM

## 2016-11-07 DIAGNOSIS — E1140 Type 2 diabetes mellitus with diabetic neuropathy, unspecified: Secondary | ICD-10-CM

## 2016-11-07 DIAGNOSIS — I2510 Atherosclerotic heart disease of native coronary artery without angina pectoris: Secondary | ICD-10-CM

## 2016-11-07 DIAGNOSIS — Z8679 Personal history of other diseases of the circulatory system: Secondary | ICD-10-CM

## 2016-11-07 DIAGNOSIS — I2583 Coronary atherosclerosis due to lipid rich plaque: Secondary | ICD-10-CM | POA: Diagnosis not present

## 2016-11-07 MED ORDER — POTASSIUM CHLORIDE CRYS ER 20 MEQ PO TBCR: 20.0000 meq | EXTENDED_RELEASE_TABLET | Freq: Every day | ORAL | 6 refills | Status: DC | PRN

## 2016-11-07 MED ORDER — FUROSEMIDE 20 MG PO TABS: 20.0000 mg | ORAL_TABLET | Freq: Every day | ORAL | 6 refills | Status: DC | PRN

## 2016-11-07 NOTE — Patient Instructions (Signed)
Medication Instructions:   Please take furosemide/lasix twice a week for ankle swelling, shortness of breath -Take with potassium  Stay on HCTZ every other day  Left atrial appendage closure device ("watchman device") To decrease risk of stroke Needs aspirin and plavix for a few months after device is placed  Labwork:  No new labs needed  Testing/Procedures:  No further testing at this time   I recommend watching educational videos on topics of interest to you at:       [email or web address].com  Enter code: [email or web address]    Follow-Up: It was a pleasure seeing you in the office today. Please call us if you have new issues that need to be addressed before your next appt.  [phone]  Your physician wants you to follow-up in: 6 months.  You will receive a reminder letter in the mail two months in advance. If you don't receive a letter, please call our office to schedule the follow-up appointment.  If you need a refill on your cardiac medications before your next appointment, please call your pharmacy.

## 2016-11-07 NOTE — Progress Notes (Signed)
Cardiology Office Note  Date:  11/07/2016   ID:  JAMELL COMBEE, DOB [birth year], MRN [id number]  PCP:  Leonel Ramsay, MD   Chief Complaint  Patient presents with  . other    Ref by Dr. Williemae Area for paroxysmal a-fib. Meds reviewed by the pt. verbally. Pt. c/o shortness of breath, LE edema and feeling tired.     HPI:  Mr. Uplinger Is a very pleasant 82 year old gentleman with PUD, NIDDM, HTN, CAD s/p CABG in 1996 and hx of SDH while on aspirin,prostate cancer 11 year ,  recent admission to the hospital with upper GI bleed and acute blood loss anemia October 2017, who presents by referral from Dr. Ola Spurr for evaluation of his atrial fibrillation  Review of previous EKG shows atrial fibrillation/flutter dating back to January 2016 He reports having a fall, presented to the hospital EKG documenting arrhythmia  follow-up studies done through [place] [facility]12/2016: stress test: Showing atrial fibrillation  He reports that he was never told he was in atrial fibrillation in 2016, only recently at [facility]. In general is asymptomatic Family presents with him today and wonders if this could be contributing to his shortness of breath he has been taking HCTZ every other day for ankle swelling  History of subdural hematoma some time  before 2000 per the patient, requiring surgery  "drilled into brain to relieve pressure" At the time he was taking aspirin/Excedrin on a daily basis for migraine symptoms   he is followed by Neurology Dr. 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 without significant mass effect. Advanced cortical atrophy with prominent midbrain volume loss.  Early 09/2016 he developed abdominal pain and GI  bleed.   diagnosed with duodenal stenosis.   had a ct and EGD  treated with PPI   Echo v10/3/17 - Akinesis of the distal inferior wall with overall preserved LV systolic function; elevated LV filling pressure; calcified aortic valve with very mild AS; mild AI; mild MR; moderate LAE; moderately reduced RV function; mild TR with moderately elevated pulmonary pressure.   EKG on today's visit shows atrial fibrillation with rate 74 bpm, right bundle branch block, unable to exclude old inferior MI   PMH:   has a past medical history of Cancer ([place]); Coronary artery disease; Diabetes mellitus without complication ([place]); Essential tremor; H. pylori infection; Hypertension; and Intracranial hemorrhage ([place]).  PSH:    Past Surgical History:  Procedure Laterality Date  . brain surgery for bleed    . cardiac bypass    . CORONARY ARTERY BYPASS GRAFT    . ESOPHAGOGASTRODUODENOSCOPY (EGD) WITH PROPOFOL N/A 09/18/2016   Procedure: ESOPHAGOGASTRODUODENOSCOPY (EGD) WITH PROPOFOL;  Surgeon: [name], MD;  Location: [facility];  Service: Endoscopy;  Laterality: N/A;    Current Outpatient Prescriptions  Medication Sig Dispense Refill  . glipiZIDE (GLUCOTROL) 5 MG tablet Take 5 mg by mouth daily.     . hydrochlorothiazide (HYDRODIURIL) 25 MG tablet Take 25 mg by mouth every other day.    . metFORMIN (GLUCOPHAGE) 1000 MG tablet Take 1,000 mg by mouth daily.     . metoprolol (LOPRESSOR) 100 MG tablet Take 50-100 mg by mouth See admin instructions. Take 100 mg by mouth in the morning  and take 50 mg by mouth at lunch    . pantoprazole (PROTONIX) 40 MG tablet Take 1 tablet (40 mg total) by mouth 2 (two) times daily before a meal. 120 tablet 0  . pravastatin (PRAVACHOL) 40 MG tablet Take 40 mg by mouth daily.    . furosemide (LASIX) 20 MG tablet Take 1 tablet (20 mg total) by mouth daily as needed. 30 tablet 6  . potassium chloride SA (K-DUR,KLOR-CON) 20 MEQ tablet Take 1 tablet (20 mEq  total) by mouth daily as needed. 30 tablet 6   No current facility-administered medications for this visit.      Allergies:   Bupropion; Fenofibrate micronized; Gemfibrozil; Niacin; Phenobarbital; and Simvastatin   Social History:  The patient  reports that he has quit smoking. He has never used smokeless tobacco. He reports that he does not drink alcohol or use drugs.   Family History:   family history includes Heart attack in his father; Stroke in his mother.    Review of Systems: Review of Systems  Constitutional: Negative.   Respiratory: Positive for shortness of breath.   Cardiovascular: Positive for leg swelling.  Gastrointestinal: Negative.   Musculoskeletal: Negative.        Unsteady gait  Neurological: Negative.   Psychiatric/Behavioral: Negative.   All other systems reviewed and are negative.    PHYSICAL EXAM: VS:  BP 110/66 (BP Location: Right Arm, Patient Position: Sitting, Cuff Size: Normal)   Pulse 74   Ht 6' (1.829 m)   Wt 197 lb 12 oz (89.7 kg)   BMI 26.82 kg/m  , BMI Body mass index is 26.82 kg/m. GEN: Well nourished, well developed, in no acute distress  HEENT: normal  Neck: no JVD, carotid bruits, or masses Cardiac. Irregularly irregular,no murmurs, rubs, or gallops,no edema  Respiratory:  clear to auscultation bilaterally, normal work of breathing GI: soft, nontender, nondistended, + BS MS: no deformity or atrophy  Skin: warm and dry, no rash Neuro:  Strength and sensation are intact Psych: euthymic mood, full affect    Recent Labs: 09/16/2016: ALT 46 09/17/2016: Magnesium 2.2; TSH 3.622 09/20/2016: BUN 5; Creatinine, Ser 0.83; Potassium 3.9; Sodium 139 09/21/2016: Hemoglobin 9.5; Platelets 241    Lipid Panel No results found for: CHOL, HDL, LDLCALC, TRIG    Wt Readings from Last 3 Encounters:  11/07/16 197 lb 12 oz (89.7 kg)  09/20/16 182 lb 15.7 oz (83 kg)  09/16/16 195 lb (88.5 kg)       ASSESSMENT AND PLAN:  Coronary artery  disease due to lipid rich plaque s/p CABG 1996 - Plan: EKG 12-Lead Currently with no symptoms of angina. No further workup at this time. Continue current medication regimen.  Essential hypertension - Plan: EKG 12-Lead Blood pressure running low Recommended he decrease metoprolol down to 50 mg twice a day for any lightheadedness or dizziness  History of subdural hematoma Residual blood seen on MRI scan last year Long discussion concerning history of subdural hematoma, risk and benefit of anticoagulation Unclear if he is a candidate for aspirin, Plavix or both If he is a candidate, he possibly might benefit from a Watchman left atrial appendage closure device. Likely not a candidate for warfarin or NOAC given prior history  Chronic atrial fibrillation ([place]) First noted to have arrhythmia genera 2016, has been asymptomatic apart from leg edema, shortness of breath on exertion. Recent echocardiogram with elevated right heart pressures. Recommended he take Lasix with potassium 2 days per week. Suggested he stay on HCTZ  every other day as he is doing currently  Type 2 diabetes mellitus with diabetic neuropathy, without long-term current use of insulin ([place]) Managed by primary care  GI bleed Recent GI bleed early October 2007, duodenal stricture He reports symptoms improved on PPI Discussed risk and benefit of anticoagulation   Total encounter time more than 45 minutes  Greater than 50% was spent in counseling and coordination of care with the patient   Disposition:   F/U  6 months   Orders Placed This Encounter  Procedures  . EKG 12-Lead     Signed, Esmond Plants, M.D., Ph.D. 11/07/2016  [place], [place]

## 2016-11-14 ENCOUNTER — Other Ambulatory Visit: Payer: Self-pay | Admitting: Neurology

## 2016-11-14 DIAGNOSIS — I6200 Nontraumatic subdural hemorrhage, unspecified: Secondary | ICD-10-CM

## 2016-11-20 ENCOUNTER — Telehealth: Payer: Self-pay | Admitting: Cardiovascular Disease

## 2016-11-20 NOTE — Telephone Encounter (Signed)
Spoke w/ pt's daughter.  She reports that pt does not drink very much, she actually has to tell him to drink to make sure he is not dehydrated. She listed pt's typical diet, is not following low sodium diet.  Pt does not weigh daily.  She does not feel that pt is urinating enough, his legs are still swollen and tight. She would like to increase his lasix dosage. Advised her to obtain compression hose and have pt keep feet elevated when sitting.   She states that pt is not a candidate for Watchman Device and she would like to know what Dr. Rockey Situ recommends next in light of this. He is sched for brain MRI on 12/14 for "brain bleed, but we don't know when it happened".   She asks that I make Dr. Rockey Situ aware of her concerns and call back w/ his recommendation.

## 2016-11-20 NOTE — Telephone Encounter (Signed)
Pt caretaker calling stating she doesn't think fluid pills are not working. May not be strong enough Pt feet keep swelling They are swollen at the moment , he states they are "tight"  They don't seem to be going down over night either Would like some advise on this  Pt has some SOB but no more than normal

## 2016-11-21 NOTE — Telephone Encounter (Signed)
He could increase Lasix with potassium up to daily until symptoms improve Then go to every other day

## 2016-11-22 NOTE — Telephone Encounter (Signed)
Spoke w/ pt's daughter. Advised her of Dr. Donivan Scull recommendation.  She reports that pt adamantly does not want to wear compression hose. She reports that his legs were tight last night, he took 2 lasix yesterday and today they look much better. Judeen Hammans is a pt in our office, as well, she is on torsemide and would like to know if her father should take this. Advised her that torsemide is a stronger diuretic and can dehydrate pt. She then asks if she needs to be put back on lasix.  Advised her that she will need to speak w/ Dr. Rockey Situ about this.  She will have pt follow lasix instructions and call back if sx do not improve.

## 2016-11-22 NOTE — Telephone Encounter (Signed)
Left message for Robert Keith to call back.

## 2016-11-26 ENCOUNTER — Ambulatory Visit: Payer: Medicare Other

## 2016-11-29 ENCOUNTER — Ambulatory Visit
Admission: RE | Admit: 2016-11-29 | Discharge: 2016-11-29 | Disposition: A | Discharge status: Home / Self Care | Payer: Medicare Other | Source: Ambulatory Visit | Attending: Neurology | Admitting: Neurology

## 2016-11-29 DIAGNOSIS — I6200 Nontraumatic subdural hemorrhage, unspecified: Secondary | ICD-10-CM | POA: Diagnosis present

## 2017-06-10 ENCOUNTER — Other Ambulatory Visit: Payer: Self-pay | Admitting: Cardiovascular Disease

## 2017-06-17 ENCOUNTER — Telehealth: Payer: Self-pay | Admitting: Cardiovascular Disease

## 2017-06-17 NOTE — Telephone Encounter (Signed)
Lmov for patient to call back They are due for an appointment to see Dr Rockey Situ   Will try again at a later time

## 2017-06-17 NOTE — Telephone Encounter (Signed)
-----   Message from Anselm Pancoast, [place] sent at 06/10/2017  8:34 AM EDT ----- Please contact patient for a follow up. He was to follow up with Dr. Rockey Situ in May 2018.  Thanks Arvilla Market

## 2017-06-25 NOTE — Telephone Encounter (Signed)
Spoke to patient, he states he is not ready to schedule at this time and will call us when he is ready to do so.

## 2017-06-26 ENCOUNTER — Encounter (HOSPITAL_COMMUNITY): Payer: Self-pay

## 2017-06-26 ENCOUNTER — Emergency Department (HOSPITAL_COMMUNITY): Payer: Medicare Other | Admitting: Anesthesiology

## 2017-06-26 ENCOUNTER — Inpatient Hospital Stay (HOSPITAL_COMMUNITY)
Admission: EM | Admit: 2017-06-26 | Discharge: 2017-07-17 | DRG: 023 | Disposition: A | Discharge status: Skilled Nursing Facility | Payer: Medicare Other | Attending: Neurology | Admitting: Neurology

## 2017-06-26 ENCOUNTER — Emergency Department (HOSPITAL_COMMUNITY): Payer: Medicare Other

## 2017-06-26 ENCOUNTER — Encounter (HOSPITAL_COMMUNITY)
Admission: EM | Disposition: A | Discharge status: Skilled Nursing Facility | Payer: Self-pay | Source: Home / Self Care | Attending: Neurology

## 2017-06-26 ENCOUNTER — Inpatient Hospital Stay (HOSPITAL_COMMUNITY): Payer: Medicare Other

## 2017-06-26 DIAGNOSIS — R509 Fever, unspecified: Secondary | ICD-10-CM | POA: Diagnosis not present

## 2017-06-26 DIAGNOSIS — Z66 Do not resuscitate: Secondary | ICD-10-CM | POA: Diagnosis present

## 2017-06-26 DIAGNOSIS — G9340 Encephalopathy, unspecified: Secondary | ICD-10-CM | POA: Diagnosis not present

## 2017-06-26 DIAGNOSIS — I360 Nonrheumatic tricuspid (valve) stenosis: Secondary | ICD-10-CM | POA: Diagnosis not present

## 2017-06-26 DIAGNOSIS — T81719S Complication of unspecified artery following a procedure, not elsewhere classified, sequela: Secondary | ICD-10-CM | POA: Diagnosis not present

## 2017-06-26 DIAGNOSIS — E1165 Type 2 diabetes mellitus with hyperglycemia: Secondary | ICD-10-CM | POA: Diagnosis present

## 2017-06-26 DIAGNOSIS — I9742 Intraoperative hemorrhage and hematoma of a circulatory system organ or structure complicating other procedure: Secondary | ICD-10-CM | POA: Diagnosis not present

## 2017-06-26 DIAGNOSIS — E870 Hyperosmolality and hypernatremia: Secondary | ICD-10-CM | POA: Diagnosis not present

## 2017-06-26 DIAGNOSIS — Z0189 Encounter for other specified special examinations: Secondary | ICD-10-CM

## 2017-06-26 DIAGNOSIS — I639 Cerebral infarction, unspecified: Secondary | ICD-10-CM | POA: Diagnosis present

## 2017-06-26 DIAGNOSIS — R4701 Aphasia: Secondary | ICD-10-CM

## 2017-06-26 DIAGNOSIS — I482 Chronic atrial fibrillation: Secondary | ICD-10-CM | POA: Diagnosis present

## 2017-06-26 DIAGNOSIS — R578 Other shock: Secondary | ICD-10-CM | POA: Diagnosis not present

## 2017-06-26 DIAGNOSIS — Z951 Presence of aortocoronary bypass graft: Secondary | ICD-10-CM

## 2017-06-26 DIAGNOSIS — I63512 Cerebral infarction due to unspecified occlusion or stenosis of left middle cerebral artery: Secondary | ICD-10-CM | POA: Diagnosis not present

## 2017-06-26 DIAGNOSIS — I248 Other forms of acute ischemic heart disease: Secondary | ICD-10-CM | POA: Diagnosis present

## 2017-06-26 DIAGNOSIS — R0689 Other abnormalities of breathing: Secondary | ICD-10-CM

## 2017-06-26 DIAGNOSIS — J90 Pleural effusion, not elsewhere classified: Secondary | ICD-10-CM

## 2017-06-26 DIAGNOSIS — E874 Mixed disorder of acid-base balance: Secondary | ICD-10-CM | POA: Diagnosis present

## 2017-06-26 DIAGNOSIS — J95821 Acute postprocedural respiratory failure: Secondary | ICD-10-CM | POA: Diagnosis not present

## 2017-06-26 DIAGNOSIS — N179 Acute kidney failure, unspecified: Secondary | ICD-10-CM | POA: Diagnosis not present

## 2017-06-26 DIAGNOSIS — I10 Essential (primary) hypertension: Secondary | ICD-10-CM | POA: Diagnosis present

## 2017-06-26 DIAGNOSIS — R0682 Tachypnea, not elsewhere classified: Secondary | ICD-10-CM

## 2017-06-26 DIAGNOSIS — Z79899 Other long term (current) drug therapy: Secondary | ICD-10-CM | POA: Diagnosis not present

## 2017-06-26 DIAGNOSIS — G8191 Hemiplegia, unspecified affecting right dominant side: Secondary | ICD-10-CM | POA: Diagnosis present

## 2017-06-26 DIAGNOSIS — J9600 Acute respiratory failure, unspecified whether with hypoxia or hypercapnia: Secondary | ICD-10-CM

## 2017-06-26 DIAGNOSIS — D696 Thrombocytopenia, unspecified: Secondary | ICD-10-CM | POA: Diagnosis not present

## 2017-06-26 DIAGNOSIS — Z8546 Personal history of malignant neoplasm of prostate: Secondary | ICD-10-CM | POA: Diagnosis not present

## 2017-06-26 DIAGNOSIS — Q251 Coarctation of aorta: Secondary | ICD-10-CM

## 2017-06-26 DIAGNOSIS — I728 Aneurysm of other specified arteries: Secondary | ICD-10-CM | POA: Diagnosis not present

## 2017-06-26 DIAGNOSIS — G936 Cerebral edema: Secondary | ICD-10-CM | POA: Diagnosis present

## 2017-06-26 DIAGNOSIS — R29732 NIHSS score 32: Secondary | ICD-10-CM | POA: Diagnosis present

## 2017-06-26 DIAGNOSIS — R29810 Facial weakness: Secondary | ICD-10-CM

## 2017-06-26 DIAGNOSIS — I724 Aneurysm of artery of lower extremity: Secondary | ICD-10-CM

## 2017-06-26 DIAGNOSIS — D62 Acute posthemorrhagic anemia: Secondary | ICD-10-CM | POA: Diagnosis not present

## 2017-06-26 DIAGNOSIS — G8929 Other chronic pain: Secondary | ICD-10-CM | POA: Diagnosis present

## 2017-06-26 DIAGNOSIS — Z87891 Personal history of nicotine dependence: Secondary | ICD-10-CM

## 2017-06-26 DIAGNOSIS — I2510 Atherosclerotic heart disease of native coronary artery without angina pectoris: Secondary | ICD-10-CM | POA: Diagnosis present

## 2017-06-26 DIAGNOSIS — D72829 Elevated white blood cell count, unspecified: Secondary | ICD-10-CM | POA: Diagnosis not present

## 2017-06-26 DIAGNOSIS — I6203 Nontraumatic chronic subdural hemorrhage: Secondary | ICD-10-CM | POA: Diagnosis not present

## 2017-06-26 DIAGNOSIS — I63412 Cerebral infarction due to embolism of left middle cerebral artery: Principal | ICD-10-CM | POA: Diagnosis present

## 2017-06-26 DIAGNOSIS — S7011XA Contusion of right thigh, initial encounter: Secondary | ICD-10-CM | POA: Diagnosis not present

## 2017-06-26 DIAGNOSIS — N1330 Unspecified hydronephrosis: Secondary | ICD-10-CM | POA: Diagnosis present

## 2017-06-26 DIAGNOSIS — Z823 Family history of stroke: Secondary | ICD-10-CM | POA: Diagnosis not present

## 2017-06-26 DIAGNOSIS — Z7984 Long term (current) use of oral hypoglycemic drugs: Secondary | ICD-10-CM

## 2017-06-26 DIAGNOSIS — H9190 Unspecified hearing loss, unspecified ear: Secondary | ICD-10-CM | POA: Diagnosis present

## 2017-06-26 DIAGNOSIS — R531 Weakness: Secondary | ICD-10-CM

## 2017-06-26 DIAGNOSIS — Z9889 Other specified postprocedural states: Secondary | ICD-10-CM | POA: Diagnosis not present

## 2017-06-26 DIAGNOSIS — G250 Essential tremor: Secondary | ICD-10-CM | POA: Diagnosis present

## 2017-06-26 DIAGNOSIS — R58 Hemorrhage, not elsewhere classified: Secondary | ICD-10-CM

## 2017-06-26 DIAGNOSIS — Z01818 Encounter for other preprocedural examination: Secondary | ICD-10-CM

## 2017-06-26 DIAGNOSIS — Z9911 Dependence on respirator [ventilator] status: Secondary | ICD-10-CM

## 2017-06-26 DIAGNOSIS — I97638 Postprocedural hematoma of a circulatory system organ or structure following other circulatory system procedure: Secondary | ICD-10-CM | POA: Diagnosis not present

## 2017-06-26 DIAGNOSIS — T81719A Complication of unspecified artery following a procedure, not elsewhere classified, initial encounter: Secondary | ICD-10-CM | POA: Diagnosis not present

## 2017-06-26 DIAGNOSIS — R0602 Shortness of breath: Secondary | ICD-10-CM

## 2017-06-26 DIAGNOSIS — R748 Abnormal levels of other serum enzymes: Secondary | ICD-10-CM | POA: Diagnosis not present

## 2017-06-26 DIAGNOSIS — E876 Hypokalemia: Secondary | ICD-10-CM | POA: Diagnosis not present

## 2017-06-26 DIAGNOSIS — T81718A Complication of other artery following a procedure, not elsewhere classified, initial encounter: Secondary | ICD-10-CM

## 2017-06-26 DIAGNOSIS — J9601 Acute respiratory failure with hypoxia: Secondary | ICD-10-CM

## 2017-06-26 DIAGNOSIS — R1310 Dysphagia, unspecified: Secondary | ICD-10-CM | POA: Diagnosis present

## 2017-06-26 DIAGNOSIS — R05 Cough: Secondary | ICD-10-CM

## 2017-06-26 DIAGNOSIS — R059 Cough, unspecified: Secondary | ICD-10-CM

## 2017-06-26 DIAGNOSIS — I671 Cerebral aneurysm, nonruptured: Secondary | ICD-10-CM | POA: Diagnosis present

## 2017-06-26 DIAGNOSIS — Z888 Allergy status to other drugs, medicaments and biological substances status: Secondary | ICD-10-CM

## 2017-06-26 HISTORY — DX: Traumatic subdural hemorrhage with loss of consciousness status unknown, initial encounter: S06.5XAA

## 2017-06-26 HISTORY — PX: RADIOLOGY WITH ANESTHESIA: SHX6223

## 2017-06-26 HISTORY — DX: Other specified postprocedural states: Z98.890

## 2017-06-26 HISTORY — DX: Anemia, unspecified: D64.9

## 2017-06-26 HISTORY — DX: Chronic atrial fibrillation, unspecified: I48.20

## 2017-06-26 HISTORY — DX: Gastrointestinal hemorrhage, unspecified: K92.2

## 2017-06-26 HISTORY — PX: IR PERCUTANEOUS ART THROMBECTOMY/INFUSION INTRACRANIAL INC DIAG ANGIO: IMG6087

## 2017-06-26 HISTORY — DX: Unspecified right bundle-branch block: I45.10

## 2017-06-26 HISTORY — DX: Traumatic subdural hemorrhage with loss of consciousness of unspecified duration, initial encounter: S06.5X9A

## 2017-06-26 HISTORY — DX: Malignant neoplasm of prostate: C61

## 2017-06-26 LAB — COMPREHENSIVE METABOLIC PANEL
ALK PHOS: 88 U/L (ref 38–126)
ALT: 33 U/L (ref 17–63)
AST: 34 U/L (ref 15–41)
Albumin: 3.6 g/dL (ref 3.5–5.0)
Anion gap: 6 (ref 5–15)
BILIRUBIN TOTAL: 0.9 mg/dL (ref 0.3–1.2)
BUN: 20 mg/dL (ref 6–20)
CALCIUM: 8.7 mg/dL — AB (ref 8.9–10.3)
CO2: 21 mmol/L — ABNORMAL LOW (ref 22–32)
CREATININE: 1.03 mg/dL (ref 0.61–1.24)
Chloride: 110 mmol/L (ref 101–111)
GFR calc Af Amer: >60 mL/min (ref >60)
Glucose, Bld: 135 mg/dL — ABNORMAL HIGH (ref 65–99)
Potassium: 4.4 mmol/L (ref 3.5–5.1)
Sodium: 137 mmol/L (ref 135–145)
TOTAL PROTEIN: 6.7 g/dL (ref 6.5–8.1)

## 2017-06-26 LAB — POCT I-STAT 3, ART BLOOD GAS (G3+)
ACID-BASE DEFICIT: 1 mmol/L (ref 0.0–2.0)
Bicarbonate: 23.6 mmol/L (ref 20.0–28.0)
O2 SAT: 100 %
TCO2: 25 mmol/L (ref 0–100)
pCO2 arterial: 37.1 mmHg (ref 32.0–48.0)
pH, Arterial: 7.41 (ref 7.350–7.450)
pO2, Arterial: 251 mmHg — ABNORMAL HIGH (ref 83.0–108.0)

## 2017-06-26 LAB — DIFFERENTIAL
BASOS ABS: 0 10*3/uL (ref 0.0–0.1)
Basophils Relative: 0 %
EOS ABS: 0.3 10*3/uL (ref 0.0–0.7)
Eosinophils Relative: 5 %
LYMPHS ABS: 1.6 10*3/uL (ref 0.7–4.0)
Lymphocytes Relative: 27 %
MONOS PCT: 16 %
Monocytes Absolute: 0.9 10*3/uL (ref 0.1–1.0)
NEUTROS ABS: 3.1 10*3/uL (ref 1.7–7.7)
Neutrophils Relative %: 52 %

## 2017-06-26 LAB — PROTIME-INR
INR: 1.13
Prothrombin Time: 14.5 seconds (ref 11.4–15.2)

## 2017-06-26 LAB — CBC
HEMATOCRIT: 37.9 % — AB (ref 39.0–52.0)
HEMOGLOBIN: 12.3 g/dL — AB (ref 13.0–17.0)
MCH: 29.9 pg (ref 26.0–34.0)
MCHC: 32.5 g/dL (ref 30.0–36.0)
MCV: 92.2 fL (ref 78.0–100.0)
Platelets: 195 10*3/uL (ref 150–400)
RBC: 4.11 MIL/uL — ABNORMAL LOW (ref 4.22–5.81)
RDW: 15 % (ref 11.5–15.5)
WBC: 5.9 10*3/uL (ref 4.0–10.5)

## 2017-06-26 LAB — I-STAT CHEM 8, ED
BUN: 23 mg/dL — AB (ref 6–20)
CALCIUM ION: 1.12 mmol/L — AB (ref 1.15–1.40)
CHLORIDE: 107 mmol/L (ref 101–111)
CREATININE: 0.9 mg/dL (ref 0.61–1.24)
Glucose, Bld: 133 mg/dL — ABNORMAL HIGH (ref 65–99)
HCT: 38 % — ABNORMAL LOW (ref 39.0–52.0)
Hemoglobin: 12.9 g/dL — ABNORMAL LOW (ref 13.0–17.0)
Potassium: 4.4 mmol/L (ref 3.5–5.1)
Sodium: 141 mmol/L (ref 135–145)
TCO2: 24 mmol/L (ref 0–100)

## 2017-06-26 LAB — GLUCOSE, CAPILLARY
Glucose-Capillary: 189 mg/dL — ABNORMAL HIGH (ref 65–99)
Glucose-Capillary: 218 mg/dL — ABNORMAL HIGH (ref 65–99)

## 2017-06-26 LAB — FIBRINOGEN: FIBRINOGEN: 247 mg/dL (ref 210–475)

## 2017-06-26 LAB — HEMOGLOBIN AND HEMATOCRIT, BLOOD
HCT: 30.9 % — ABNORMAL LOW (ref 39.0–52.0)
Hemoglobin: 10 g/dL — ABNORMAL LOW (ref 13.0–17.0)

## 2017-06-26 LAB — APTT: APTT: 29 s (ref 24–36)

## 2017-06-26 LAB — I-STAT TROPONIN, ED: TROPONIN I, POC: 0 ng/mL (ref 0.00–0.08)

## 2017-06-26 LAB — CBG MONITORING, ED: Glucose-Capillary: 133 mg/dL — ABNORMAL HIGH (ref 65–99)

## 2017-06-26 SURGERY — RADIOLOGY WITH ANESTHESIA
Anesthesia: General

## 2017-06-26 MED ORDER — NITROGLYCERIN 1 MG/10 ML FOR IR/CATH LAB
INTRA_ARTERIAL | Status: AC
  Filled 2017-06-26: qty 10

## 2017-06-26 MED ORDER — SODIUM CHLORIDE 0.9 % IV SOLN: INTRAVENOUS | Status: DC

## 2017-06-26 MED ORDER — ACETAMINOPHEN 160 MG/5ML PO SOLN: 650.0000 mg | ORAL | Status: DC | PRN

## 2017-06-26 MED ORDER — HEPARIN SOD (PORK) LOCK FLUSH 100 UNIT/ML IV SOLN
INTRAVENOUS | Status: AC
  Filled 2017-06-26: qty 5

## 2017-06-26 MED ORDER — IOPAMIDOL (ISOVUE-300) INJECTION 61%
INTRAVENOUS | Status: AC
  Administered 2017-06-26: 60 mL
  Filled 2017-06-26: qty 150

## 2017-06-26 MED ORDER — ACETAMINOPHEN 160 MG/5ML PO SOLN
650.0000 mg | ORAL | Status: DC | PRN
  Administered 2017-06-29 – 2017-07-17 (×8): 650 mg
  Filled 2017-06-26 (×8): qty 20.3

## 2017-06-26 MED ORDER — EPTIFIBATIDE 20 MG/10ML IV SOLN
INTRAVENOUS | Status: AC
  Filled 2017-06-26: qty 10

## 2017-06-26 MED ORDER — FENTANYL CITRATE (PF) 100 MCG/2ML IJ SOLN
INTRAMUSCULAR | Status: DC | PRN
  Administered 2017-06-26 (×2): 50 ug via INTRAVENOUS

## 2017-06-26 MED ORDER — ROCURONIUM BROMIDE 10 MG/ML (PF) SYRINGE
PREFILLED_SYRINGE | INTRAVENOUS | Status: DC | PRN
Start: 2017-06-26 — End: 2017-06-26
  Administered 2017-06-26: 20 mg via INTRAVENOUS
  Administered 2017-06-26: 60 mg via INTRAVENOUS
  Administered 2017-06-26: 20 mg via INTRAVENOUS

## 2017-06-26 MED ORDER — SODIUM CHLORIDE 0.9 % IV SOLN
INTRAVENOUS | Status: DC | PRN
  Administered 2017-06-26 (×2): via INTRAVENOUS

## 2017-06-26 MED ORDER — PROPOFOL 10 MG/ML IV BOLUS
INTRAVENOUS | Status: DC | PRN
  Administered 2017-06-26: 100 mg via INTRAVENOUS

## 2017-06-26 MED ORDER — PROPOFOL 1000 MG/100ML IV EMUL
0.0000 ug/kg/min | INTRAVENOUS | Status: DC
  Administered 2017-06-26: 10 ug/kg/min via INTRAVENOUS
  Administered 2017-06-27: 5 ug/kg/min via INTRAVENOUS
  Filled 2017-06-26 (×2): qty 100

## 2017-06-26 MED ORDER — PROMETHAZINE HCL 25 MG/ML IJ SOLN: 6.2500 mg | INTRAMUSCULAR | Status: DC | PRN

## 2017-06-26 MED ORDER — FENTANYL CITRATE (PF) 100 MCG/2ML IJ SOLN
50.0000 ug | INTRAMUSCULAR | Status: DC | PRN
  Administered 2017-06-27 (×3): 50 ug via INTRAVENOUS
  Filled 2017-06-26 (×3): qty 2

## 2017-06-26 MED ORDER — SODIUM CHLORIDE 0.9 % IV SOLN: 250.0000 mL | INTRAVENOUS | Status: DC | PRN

## 2017-06-26 MED ORDER — IOPAMIDOL (ISOVUE-300) INJECTION 61%
INTRAVENOUS | Status: AC
  Filled 2017-06-26: qty 150

## 2017-06-26 MED ORDER — LIDOCAINE HCL (PF) 1 % IJ SOLN
INTRAMUSCULAR | Status: AC
  Filled 2017-06-26: qty 30

## 2017-06-26 MED ORDER — IOPAMIDOL (ISOVUE-370) INJECTION 76%
INTRAVENOUS | Status: AC
  Administered 2017-06-26: 50 mL
  Filled 2017-06-26: qty 50

## 2017-06-26 MED ORDER — ONDANSETRON HCL 4 MG/2ML IJ SOLN: 4.0000 mg | Freq: Four times a day (QID) | INTRAMUSCULAR | Status: DC | PRN

## 2017-06-26 MED ORDER — ALTEPLASE (STROKE) FULL DOSE INFUSION
0.9000 mg/kg | Freq: Once | INTRAVENOUS | Status: AC
  Administered 2017-06-26: 77 mg via INTRAVENOUS
  Filled 2017-06-26: qty 100

## 2017-06-26 MED ORDER — ACETAMINOPHEN 650 MG RE SUPP
650.0000 mg | RECTAL | Status: DC | PRN
  Administered 2017-06-28 – 2017-07-15 (×3): 650 mg via RECTAL
  Filled 2017-06-26 (×3): qty 1

## 2017-06-26 MED ORDER — ACETAMINOPHEN 325 MG PO TABS: 650.0000 mg | ORAL_TABLET | ORAL | Status: DC | PRN

## 2017-06-26 MED ORDER — CEFAZOLIN SODIUM-DEXTROSE 2-4 GM/100ML-% IV SOLN
INTRAVENOUS | Status: AC
  Filled 2017-06-26: qty 100

## 2017-06-26 MED ORDER — ACETAMINOPHEN 650 MG RE SUPP: 650.0000 mg | RECTAL | Status: DC | PRN

## 2017-06-26 MED ORDER — IOPAMIDOL (ISOVUE-370) INJECTION 76%
INTRAVENOUS | Status: AC
  Administered 2017-06-26: 40 mL
  Filled 2017-06-26: qty 50

## 2017-06-26 MED ORDER — LIDOCAINE 2% (20 MG/ML) 5 ML SYRINGE
INTRAMUSCULAR | Status: DC | PRN
  Administered 2017-06-26: 60 mg via INTRAVENOUS

## 2017-06-26 MED ORDER — ACETAMINOPHEN 325 MG PO TABS
650.0000 mg | ORAL_TABLET | ORAL | Status: DC | PRN
  Administered 2017-07-08: 650 mg via ORAL
  Filled 2017-06-26: qty 2

## 2017-06-26 MED ORDER — CLEVIDIPINE BUTYRATE 0.5 MG/ML IV EMUL: 0.0000 mg/h | INTRAVENOUS | Status: DC

## 2017-06-26 MED ORDER — INSULIN ASPART 100 UNIT/ML ~~LOC~~ SOLN
2.0000 [IU] | SUBCUTANEOUS | Status: DC
  Administered 2017-06-26 – 2017-06-27 (×2): 6 [IU] via SUBCUTANEOUS
  Administered 2017-06-27 (×3): 4 [IU] via SUBCUTANEOUS
  Administered 2017-06-27: 2 [IU] via SUBCUTANEOUS
  Administered 2017-06-28: 6 [IU] via SUBCUTANEOUS

## 2017-06-26 MED ORDER — STROKE: EARLY STAGES OF RECOVERY BOOK
Freq: Once | Status: AC
  Administered 2017-06-26: 21:00:00
  Filled 2017-06-26: qty 1

## 2017-06-26 MED ORDER — FENTANYL CITRATE (PF) 100 MCG/2ML IJ SOLN
50.0000 ug | INTRAMUSCULAR | Status: AC | PRN
  Administered 2017-06-26 – 2017-06-27 (×3): 50 ug via INTRAVENOUS
  Filled 2017-06-26 (×3): qty 2

## 2017-06-26 MED ORDER — CEFAZOLIN SODIUM-DEXTROSE 2-3 GM-% IV SOLR
INTRAVENOUS | Status: DC | PRN
  Administered 2017-06-26: 2 g via INTRAVENOUS

## 2017-06-26 MED ORDER — SENNOSIDES-DOCUSATE SODIUM 8.6-50 MG PO TABS
1.0000 | ORAL_TABLET | Freq: Every evening | ORAL | Status: DC | PRN
  Filled 2017-06-26: qty 1

## 2017-06-26 MED ORDER — SODIUM CHLORIDE 0.9 % IV SOLN
INTRAVENOUS | Status: DC
  Administered 2017-06-26 – 2017-06-28 (×5): via INTRAVENOUS

## 2017-06-26 MED ORDER — PANTOPRAZOLE SODIUM 40 MG IV SOLR
40.0000 mg | Freq: Every day | INTRAVENOUS | Status: DC
  Administered 2017-06-26 – 2017-06-27 (×2): 40 mg via INTRAVENOUS
  Filled 2017-06-26 (×2): qty 40

## 2017-06-26 MED ORDER — PHENYLEPHRINE HCL 10 MG/ML IJ SOLN
INTRAVENOUS | Status: DC | PRN
  Administered 2017-06-26: 20 ug/min via INTRAVENOUS

## 2017-06-26 NOTE — Progress Notes (Signed)
Per Dr Estanislado Pandy request, called back to ED Pharmacist. Dr Estanislado Pandy spoke with pharmacist on the phone about this patient needs. Pharmacist states he will call back he is researching this with his clinical manager

## 2017-06-26 NOTE — Progress Notes (Signed)
Patient ID: Robert Keith, male   DOB: May 27, 1933, 83 y.o.   MRN: [id number] INR.  83 year old Rt handed male  Witnessed to have sudden neurologic change CT brain ASPECTS 10. No ICH Hyperdense Lt MCA. Patient started of IV TPA asper stroke neurology  following careful consideration of past medical history,  and after having discussed pro and cons with the patients power of attorney.. CTP  < 30 % volume 49 ml. Tmax > 60secs vol 146ml . Mismatch ratio of 3.7.  Option of endovascular revascularization discussed with power of attorney and daughter. Reasons,benefits alternatives discussed. Revascularization was to prevent further neurological injury ,and potentially help in subsequent recovery... Risks of ICH 10 % ,worsening neurological condition,vent dependency ,death and inability to revascularize were  reviewed in detail.. Questions were answered. Informed consent was obtained for endovascular revascularization under [place] from patients power of attorney.Arlean Hopping MD

## 2017-06-26 NOTE — ED Notes (Signed)
Pharmacy at bedside at this time. TPA restarted at 69 mg/hr per pharmacy dosing instructions.

## 2017-06-26 NOTE — Consult Note (Signed)
PULMONARY / CRITICAL CARE MEDICINE   Name: Robert Keith MRN: [id number] DOB: 1933/10/02    ADMISSION DATE:  06/26/2017 CONSULTATION DATE:  06/26/17  REFERRING MD:  Dr. Leonie Man  CHIEF COMPLAINT:  CVA  HISTORY OF PRESENT ILLNESS:  Patient is sedated and intubated and therefore HPI obtained from medical chart review.  83 year old male with past medical history of afib (not on NOAC 2/2 GI bleed), HTN, prior SDH in 2014 that required evacuation with residual small chronic SDH), DM, CAD, tremor, H. Pylori infection, and prostate cancer who presented 7/11 from home with witnessed onset of right facial droop, right hemiplegia and aphasia at 1330.    Upon arrival to the ER, he had global aphasia with dense right hemiplegia and left gaze.  CT head showed a hyperdense left middle cerebral artery and chronic small left frontal subdural (unchanged from prior in 2014).  TPA was given after careful discussion with the family.  CTA showed left M1 occlusion and therefore patient taken to neuro IR for mechanical thrombectomy and revascularization.  Patient intubated for procedure and therefore PCCM consulted for vent management.    During post-operative state, right femoral sheath found to be kinked, TPA reversal deemed to risky. Sheath pulled in PACU.   PAST MEDICAL HISTORY :  He  has a past medical history of Cancer ([place]); Coronary artery disease; Diabetes mellitus without complication ([place]); Essential tremor; H. pylori infection; Hypertension; and Intracranial hemorrhage ([place]).  PAST SURGICAL HISTORY: He  has a past surgical history that includes brain surgery for bleed; cardiac bypass; Coronary artery bypass graft; and Esophagogastroduodenoscopy (egd) with propofol (N/A, 09/18/2016).  Allergies  Allergen Reactions  . Bupropion Other (See Comments)    Unknown  . Fenofibrate Micronized Other (See Comments)    Unknown  . Gemfibrozil Other (See Comments)    Unknown  . Niacin Other (See Comments)     impotence  . Phenobarbital Other (See Comments)    confusion  . Simvastatin Other (See Comments)    Myalgias    No current facility-administered medications on file prior to encounter.    Current Outpatient Prescriptions on File Prior to Encounter  Medication Sig  . furosemide (LASIX) 20 MG tablet Take 1 tablet (20 mg total) by mouth daily as needed.  Marland Kitchen glipiZIDE (GLUCOTROL) 5 MG tablet Take 5 mg by mouth daily.   . hydrochlorothiazide (HYDRODIURIL) 25 MG tablet Take 25 mg by mouth every other day.  Marland Kitchen KLOR-CON M20 20 MEQ tablet TAKE 1 TABLET (20 MEQ TOTAL) BY MOUTH DAILY AS NEEDED.  . metFORMIN (GLUCOPHAGE) 1000 MG tablet Take 1,000 mg by mouth daily.   . metoprolol (LOPRESSOR) 100 MG tablet Take 50-100 mg by mouth See admin instructions. Take 100 mg by mouth in the morning and take 50 mg by mouth at lunch  . pantoprazole (PROTONIX) 40 MG tablet Take 1 tablet (40 mg total) by mouth 2 (two) times daily before a meal.  . pravastatin (PRAVACHOL) 40 MG tablet Take 40 mg by mouth daily.    FAMILY HISTORY:  His indicated that the status of his mother is unknown. He indicated that the status of his father is unknown.    SOCIAL HISTORY: He  reports that he has quit smoking. He has never used smokeless tobacco. He reports that he does not drink alcohol or use drugs.  REVIEW OF SYSTEMS:   Unable to assess as patient is intubated and sedated.   SUBJECTIVE:  Arrived to ICU intubated and on low  dose propofol gtt.   VITAL SIGNS: BP 124/65   Pulse (!) 59   Temp 97.7 F (36.5 C) (Oral)   Resp 12   Wt 189 lb 9.5 oz (86 kg)   SpO2 96%   BMI 25.71 kg/m   HEMODYNAMICS:    VENTILATOR SETTINGS:    INTAKE / OUTPUT: No intake/output data recorded.  PHYSICAL EXAMINATION: General: Elderly male, sedated  HEENT: MM pink/moist, ETT, OGT,  Neuro: Sedated, withdrawals in all extremities, weaker on right side   CV: s1s2 rrr, no m/r/g PULM: even/non-labored, lungs clear  bilaterally VE:LFYB, non-tender, bsx4 active  Extremities: warm/dry, no edema  Skin: no rashes or lesions   LABS:  BMET  Recent Labs Lab 06/26/17 1420 06/26/17 1444  NA 137 141  K 4.4 4.4  CL 110 107  CO2 21*  --   BUN 20 23*  CREATININE 1.03 0.90  GLUCOSE 135* 133*    Electrolytes  Recent Labs Lab 06/26/17 1420  CALCIUM 8.7*    CBC  Recent Labs Lab 06/26/17 1420 06/26/17 1444  WBC 5.9  --   HGB 12.3* 12.9*  HCT 37.9* 38.0*  PLT 195  --     Coag's  Recent Labs Lab 06/26/17 1420  APTT 29  INR 1.13    Sepsis Markers No results for input(s): LATICACIDVEN, PROCALCITON, O2SATVEN in the last 168 hours.  ABG No results for input(s): PHART, PCO2ART, PO2ART in the last 168 hours.  Liver Enzymes  Recent Labs Lab 06/26/17 1420  AST 34  ALT 33  ALKPHOS 88  BILITOT 0.9  ALBUMIN 3.6    Cardiac Enzymes No results for input(s): TROPONINI, PROBNP in the last 168 hours.  Glucose  Recent Labs Lab 06/26/17 1531  GLUCAP 133*    Imaging Ct Angio Head W Or Wo Contrast  Result Date: 06/26/2017 CLINICAL DATA:  Code stroke. Acute right-sided weakness, aphasia, and facial droop. EXAM: CT ANGIOGRAPHY HEAD AND NECK TECHNIQUE: Multidetector CT imaging of the head and neck was performed using the standard protocol during bolus administration of intravenous contrast. Multiplanar CT image reconstructions and MIPs were obtained to evaluate the vascular anatomy. Carotid stenosis measurements (when applicable) are obtained utilizing NASCET criteria, using the distal internal carotid diameter as the denominator. CONTRAST:  50 mL Isovue 370 COMPARISON:  CT head without contrast same day. FINDINGS: CTA NECK FINDINGS Aortic arch: Atherosclerotic calcifications are present along the undersurface of the aortic arch and at the great vessel origins without significant stenosis. Right carotid system: Right common carotid artery is tortuous. Dense atherosclerotic calcifications  are present at the carotid bifurcation. Minimal luminal diameter is 4 mm. There is no significant stenosis relative to the more distal vessel. The cervical right ICA is tortuous with mild distal regularity just below the skullbase is no significant stenosis. Left carotid system: The left common carotid artery is within normal limits. Calcified and noncalcified plaque are present at the left carotid bifurcation without significant luminal stenosis. The minimal luminal diameter is 3.3 mm. The more distal cervical left ICA is tortuous without stenosis. Vertebral arteries: The vertebral arteries originate from the subclavian arteries bilaterally. The right vertebral artery is the dominant vessel. The left vertebral artery is hypoplastic. There is no focal stenosis or occlusion of either vertebral artery in the neck. Skeleton: Multilevel endplate degenerative changes are present throughout the cervical spine. Osseous foraminal narrowing is secondary to uncovertebral spurring at C6-7 and combination of facet and uncovertebral spurring at C3-4. No focal lytic or blastic lesions are  present. AP alignment is anatomic. Other neck: Soft tissues the neck are otherwise unremarkable. Salivary glands are within normal limits. No focal mucosal or submucosal lesions are present. The larynx is within normal limits. A 10 mm hypodense thyroid nodule is present on the right. Lymph node calcifications are present in the mediastinum. Upper chest: Emphysematous changes are present at the lung apices. A calcified granuloma in right upper lobe measures 5 mm. No other focal nodule, mass, or airspace disease is present. The upper mediastinum is otherwise within normal limits. Review of the MIP images confirms the above findings CTA HEAD FINDINGS Anterior circulation: Atherosclerotic calcifications are present within the cavernous internal carotid artery's bilaterally without significant stenosis through the ICA termini bilaterally. The A1  segments are normal. The left A1 segment dominant. Anterior communicating artery is patent. The right M1 segment is normal. The left M1 segment is occluded. Right MCA branch vessels are intact. The left MCA bifurcation is not opacified. There is distal reconstitution of anterior left MCA branches. Posterior circulation: The left vertebral artery terminates at PICA. The right vertebral artery the basilar artery. There is some communication from the PICA to the vertebrobasilar junction. The basilar artery is normal. Both posterior cerebral arteries originate from basilar tip. There is moderate narrowing of the proximal posterior cerebral arteries bilaterally, right greater than left. There is some attenuation of distal vessels as well. Venous sinuses: The dural sinuses are patent. The right transverse sinus is dominant. Straight sinus and deep cerebral veins are intact. Cortical veins are unremarkable. Anatomic variants: None Delayed phase: Mild formed Review of the MIP images confirms the above findings IMPRESSION: 1. Emergent large vessel occlusion of the left M1 segment. 2. There is some reconstitution of anterior left MCA branch vessels from pial collaterals. 3. Dense calcifications at the carotid bifurcations bilaterally without significant stenoses relative to the more distal vessels. 4. Moderate narrowing of the proximal posterior cerebral arteries bilaterally, right greater than left. 5. Calcifications at great vessel origins along the arch without significant stenosis. 6. Hypoplastic left vertebral artery. 7. Multilevel cervical spinal spondylosis. 8. Emphysema. 9. Prior granulomatous disease in the right upper lobe. These results were called by telephone at the time of interpretation on 06/26/2017 at 3:09 pm to Dr. Antony Contras, who verbally acknowledged these results. Electronically Signed   By: San Morelle M.D.   On: 06/26/2017 15:33   Ct Angio Neck W Or Wo Contrast  Result Date:  06/26/2017 CLINICAL DATA:  Code stroke. Acute right-sided weakness, aphasia, and facial droop. EXAM: CT ANGIOGRAPHY HEAD AND NECK TECHNIQUE: Multidetector CT imaging of the head and neck was performed using the standard protocol during bolus administration of intravenous contrast. Multiplanar CT image reconstructions and MIPs were obtained to evaluate the vascular anatomy. Carotid stenosis measurements (when applicable) are obtained utilizing NASCET criteria, using the distal internal carotid diameter as the denominator. CONTRAST:  50 mL Isovue 370 COMPARISON:  CT head without contrast same day. FINDINGS: CTA NECK FINDINGS Aortic arch: Atherosclerotic calcifications are present along the undersurface of the aortic arch and at the great vessel origins without significant stenosis. Right carotid system: Right common carotid artery is tortuous. Dense atherosclerotic calcifications are present at the carotid bifurcation. Minimal luminal diameter is 4 mm. There is no significant stenosis relative to the more distal vessel. The cervical right ICA is tortuous with mild distal regularity just below the skullbase is no significant stenosis. Left carotid system: The left common carotid artery is within normal limits. Calcified and noncalcified  plaque are present at the left carotid bifurcation without significant luminal stenosis. The minimal luminal diameter is 3.3 mm. The more distal cervical left ICA is tortuous without stenosis. Vertebral arteries: The vertebral arteries originate from the subclavian arteries bilaterally. The right vertebral artery is the dominant vessel. The left vertebral artery is hypoplastic. There is no focal stenosis or occlusion of either vertebral artery in the neck. Skeleton: Multilevel endplate degenerative changes are present throughout the cervical spine. Osseous foraminal narrowing is secondary to uncovertebral spurring at C6-7 and combination of facet and uncovertebral spurring at C3-4. No  focal lytic or blastic lesions are present. AP alignment is anatomic. Other neck: Soft tissues the neck are otherwise unremarkable. Salivary glands are within normal limits. No focal mucosal or submucosal lesions are present. The larynx is within normal limits. A 10 mm hypodense thyroid nodule is present on the right. Lymph node calcifications are present in the mediastinum. Upper chest: Emphysematous changes are present at the lung apices. A calcified granuloma in right upper lobe measures 5 mm. No other focal nodule, mass, or airspace disease is present. The upper mediastinum is otherwise within normal limits. Review of the MIP images confirms the above findings CTA HEAD FINDINGS Anterior circulation: Atherosclerotic calcifications are present within the cavernous internal carotid artery's bilaterally without significant stenosis through the ICA termini bilaterally. The A1 segments are normal. The left A1 segment dominant. Anterior communicating artery is patent. The right M1 segment is normal. The left M1 segment is occluded. Right MCA branch vessels are intact. The left MCA bifurcation is not opacified. There is distal reconstitution of anterior left MCA branches. Posterior circulation: The left vertebral artery terminates at PICA. The right vertebral artery the basilar artery. There is some communication from the PICA to the vertebrobasilar junction. The basilar artery is normal. Both posterior cerebral arteries originate from basilar tip. There is moderate narrowing of the proximal posterior cerebral arteries bilaterally, right greater than left. There is some attenuation of distal vessels as well. Venous sinuses: The dural sinuses are patent. The right transverse sinus is dominant. Straight sinus and deep cerebral veins are intact. Cortical veins are unremarkable. Anatomic variants: None Delayed phase: Mild formed Review of the MIP images confirms the above findings IMPRESSION: 1. Emergent large vessel  occlusion of the left M1 segment. 2. There is some reconstitution of anterior left MCA branch vessels from pial collaterals. 3. Dense calcifications at the carotid bifurcations bilaterally without significant stenoses relative to the more distal vessels. 4. Moderate narrowing of the proximal posterior cerebral arteries bilaterally, right greater than left. 5. Calcifications at great vessel origins along the arch without significant stenosis. 6. Hypoplastic left vertebral artery. 7. Multilevel cervical spinal spondylosis. 8. Emphysema. 9. Prior granulomatous disease in the right upper lobe. These results were called by telephone at the time of interpretation on 06/26/2017 at 3:09 pm to Dr. Antony Contras, who verbally acknowledged these results. Electronically Signed   By: San Morelle M.D.   On: 06/26/2017 15:33   Ct Cerebral Perfusion W Contrast  Result Date: 06/26/2017 CLINICAL DATA:  Acute onset of right-sided weakness, aphasia, and facial droop. EXAM: CT PERFUSION BRAIN TECHNIQUE: Multiphase CT imaging of the brain was performed following IV bolus contrast injection. Subsequent parametric perfusion maps were calculated using RAPID software. CONTRAST:  At 40 mL Isovue-M 370 COMPARISON:  CT head without contrast from the same. FINDINGS: CT Brain Perfusion Findings: CBF (<30%) Volume: 12mL Perfusion (Tmax>6.0s) volume: 150mL Mismatch Volume: 126mL.  Mismatch ratio is 3.7  Infarction Location:Left temporal lobe and frontal operculum. Left MCA territory. IMPRESSION: Left M1 emergent large vessel occlusion with associated acute left MCA territory infarct. Core infarct is estimated at 49 mL. The tissue at risk is estimated at 182 mL. These results were called by telephone at the time of interpretation on 06/26/2017 at 3:09 pm to Dr. Antony Contras , who verbally acknowledged these results. Electronically Signed   By: San Morelle M.D.   On: 06/26/2017 15:16   Ct Head Code Stroke W/o Cm  Result Date:  06/26/2017 CLINICAL DATA:  Code stroke. 83 y/o M; right-sided weakness, aphasia, facial droop. EXAM: CT HEAD WITHOUT CONTRAST TECHNIQUE: Contiguous axial images were obtained from the base of the skull through the vertex without intravenous contrast. COMPARISON:  11/29/2016 MRI of the head and 12/07/2013 CT head. FINDINGS: Brain: No evidence of acute infarction, new hemorrhage, hydrocephalus, new extra-axial collection or mass lesion/mass effect. Chronic left frontal lobe infarction. Stable left frontal subdural hematoma measuring up to 6 mm in thickness with mild mass effect on the underlying brain and attenuation slightly increased compared with CSF. No midline shift. Vascular: Density within the left distal M1 may represent thrombus (series 6, image 38). Skull: Postsurgical changes related to left frontal burr hole are stable. Sinuses/Orbits: Mild mucosal thickening in the right maxillary sinus. Otherwise negative. Other: Bilateral intra-ocular lens replacement. ASPECTS [facility] Stroke Program Early CT Score) - Ganglionic level infarction (caudate, lentiform nuclei, internal capsule, insula, M1-M3 cortex): 7 - Supraganglionic infarction (M4-M6 cortex): 3 Total score (0-10 with 10 being normal): 10 IMPRESSION: 1. Focal hyperdensity in left distal M1 may represent thrombus. 2. No vascular territory infarct identified at this time. 3. Small left frontal subdural hematoma is stable in size and attenuation from 2014. 4. ASPECTS is 10 These results were called by telephone at the time of interpretation on 06/26/2017 at 2:40 pm to Dr. Leonie Man, who verbally acknowledged these results. Electronically Signed   By: Kristine Garbe M.D.   On: 06/26/2017 14:42   STUDIES:  7/11 Head CT >> focal hyperdensity of left distal M1 may represent thrombus, no vascular territory infarct indentified, small left frontal SDH stable in size and attenuation from 2014, ASPECTS is 10 7/11 CTA head and neck>> emergent large vessel  occlusion of left M1 segment, some reconstitution of anterior left MCA branch vessels from pial collaterals, dense calcifications at carotid bifurcation bilaterally without significant stenoses relative to more distal vessels, moderate narrowing of proximal posterior cerebral arteries bilaterally, right greater than left, calcification of great vessel origins along the arch without significant stenosis, hypoplastic left vertebral artery, multilevel cervical spinal spondylosis, emphysema, prior granulomatous disease of right upper lobe  CULTURES: none  ANTIBIOTICS: 7/11 Ancef (pre-op)  SIGNIFICANT EVENTS: 7/11  Admit   LINES/TUBES: 7/11 ETT >> 7/11 OGT>> 7/11 Left Radial Aline >> 7/11 Right Femoral Sheath > 7/11   DISCUSSION: 14 yoM with PMH of Afib- not on coag 2/2 prior GI bleed presented with witnessed onset around 1330 of   ASSESSMENT / PLAN:  PULMONARY A: Need for airway protection- post IR procedure  P:   PRVC 8 cc/kg CXR now ABG now SBT in am  VAP measures   CARDIOVASCULAR A:  Afib - not on anticoagulation 2/2 previous GI bleed Hx CAD, HTN  P:  ICU monitoring Continue Aline Strict SBP goal 120-140 TTE per stroke guidelines   RENAL A:   No acute issues  P:   Trend BMP / urinary output Replace electrolytes as indicated Avoid  nephrotoxic agents, ensure adequate renal perfusion  GASTROINTESTINAL A:   Nutrition Needs  P:   NPO No OG tube due to recent TPA  PPI for SUP  Consider TF in am if patient does not meet extubation criteria   HEMATOLOGIC A:   Anemia- mild  P:  Trend CBC SCDs  S/p TPA on 7/11 ~ 1703 Monitor for S&S of bleeding   INFECTIOUS A:   No acute processes  P:   Monitor fever curve/ trend WBC  ENDOCRINE A:   DM   P:   CBG q 4  SSI  NEUROLOGIC A:   Left M1 occlusion s/p TPA and mechanical thrombectomy w/revascularization  - with right hemiplegia, global aphasia and left gaze   Chronic small left frontal SDH from prior  SDH in 2014 that required  Hx prior SDH in 2014 requiring craniotomy  P:   RASS goal: -1 Propofol gtt  Fentanyl PRN Management per Neuro   FAMILY  - Updates: Patients sister (HPOA)  - Inter-disciplinary family meet or Palliative Care meeting due by:  7/18  CC Time: 36 minutes   [name], AGACNP-BC [place]  Pgr: [phone]  PCCM Pgr: [phone]  Attending Addendum: I personally examined this patient and agree with plan as detailed above. 66yoM with acute left MCA ischemic CVA today, now s/p mechanical thrombectomy with revascularlization, sheath pulled due to it being kinked. On my exam, patient intubated and sedated. PERRL, No response to sternal rub. CTA b/l. RRR no m/r/g, 2-18G PIV's and Left Radial Aline.  Continue mechanical ventilation. Obtained ABG and weaned FIO2. CXR on my review shows ETT in correct position. Questionable LLL infiltrate vs atelectasis as diaphragm is silhouetted out. No fever or leukocytosis though and no secretions within ETT. Will continue to monitor. Regarding the CVA, will continue BP control and sedation. Plan to repeat head imaging per Neuro. Family present at bedside including POA who wants to make patient DNR.   45 minutes critical care time  Vernie Murders, MD Pulmonary & Critical Care

## 2017-06-26 NOTE — Progress Notes (Addendum)
Patient ID: Robert Keith, male   DOB: 1933-02-10, 83 y.o.   MRN: [id number] INR. Post procedure  34F RT groin sheath after placement found to be kinked,without aspiration being possible .Attempts to access with wire unsuccessful despite use of  Dilators.. Placement of the sheath within the lumen confirmed with [place].. D/W neurology. Felt reversal of TPA with antidote potentially more risky.. Therefore the 34F groin sheath was removed and manual pressure held. Distal pulses DP palpable and PT dopplerable and unchanged..Small soft  RT groin hematoma noted. Fibrinogen level 200. Will defer CT brain post procedure as patient stable neurologically,and to prevent potential  Bleeding from the RT groin puncture site. Above discussed with patients power of attorney and daughter..  Will monitor patients hemoglobin 4 hourly in conjunction with vital signs. S.Dariyon Urquilla MD

## 2017-06-26 NOTE — Procedures (Signed)
S/P Lt common carotid arteriogram. RT CFA approach. Findings. 1S/P complete revascularization of occluded Lt MCA M1 seg with x 1 pass with Solitaire 58mm x73mm retrieval device achieving a TICI 3 reperfusion

## 2017-06-26 NOTE — Anesthesia Postprocedure Evaluation (Signed)
Anesthesia Post Note  Patient: Robert Keith  Procedure(s) Performed: Procedure(s) (LRB): RADIOLOGY WITH ANESTHESIA CODE STROKE (N/A)     Patient location during evaluation: ICU Anesthesia Type: General Level of consciousness: sedated and patient remains intubated per anesthesia plan Pain management: pain level controlled Vital Signs Assessment: post-procedure vital signs reviewed and stable Respiratory status: patient on ventilator - see flowsheet for VS and patient remains intubated per anesthesia plan Cardiovascular status: stable Anesthetic complications: no    Last Vitals:  Vitals:   06/26/17 2045 06/26/17 2100  BP: 120/61 (!) 112/58  Pulse: (!) 40 61  Resp: 15 15  Temp:  36.4 C    Last Pain:  Vitals:   06/26/17 2100  TempSrc: Axillary                 Nolon Nations

## 2017-06-26 NOTE — Progress Notes (Signed)
Right femoral sheath pulled

## 2017-06-26 NOTE — Anesthesia Procedure Notes (Signed)
Arterial Line Insertion Start/End7/10/2017 4:15 PM Performed by: Carney Living, CRNA  Preanesthetic checklist: patient identified and IV checked Lidocaine 1% used for infiltration Left, radial was placed Catheter size: 20 G Hand hygiene performed  and maximum sterile barriers used   Attempts: 1 Procedure performed without using ultrasound guided technique. Following insertion, dressing applied and Biopatch. Post procedure assessment: normal  Patient tolerated the procedure well with no immediate complications.

## 2017-06-26 NOTE — Progress Notes (Signed)
Anesthesia, Almyra Free CRNA present for case

## 2017-06-26 NOTE — Anesthesia Preprocedure Evaluation (Signed)
Anesthesia Evaluation  Patient identified by MRN, date of birth, ID band Patient unresponsive    Reviewed: Allergy & Precautions, NPO status , Patient's Chart, lab work & pertinent test resultsPreop documentation limited or incomplete due to emergent nature of procedure.  Airway Mallampati: II  TM Distance: >3 FB Neck ROM: Full    Dental no notable dental hx.    Pulmonary neg pulmonary ROS, former smoker,    Pulmonary exam normal breath sounds clear to auscultation       Cardiovascular hypertension, + CAD and + CABG  Normal cardiovascular exam Rhythm:Regular Rate:Normal  - Left ventricle: The cavity size was normal. Wall thickness was   increased in a pattern of mild LVH. Systolic function was normal.   The estimated ejection fraction was in the range of 50% to 55%.   There is akinesis of the apicalinferior myocardium. The study is   not technically sufficient to allow evaluation of LV diastolic   function. Doppler parameters are consistent with high ventricular   filling pressure. - Aortic valve: Valve mobility was restricted. There was very mild   stenosis. There was mild regurgitation. Valve area (VTI): 1.61   cm^2. - Aortic root: The aortic root was mildly dilated. - Mitral valve: There was mild regurgitation. - Left atrium: The atrium was moderately dilated. - Right ventricle: Systolic function was moderately reduced. - Pulmonary arteries: Systolic pressure was mildly increased. PA   peak pressure: 50 mm Hg (S).  Impressions:  - Akinesis of the distal inferior wall with overall preserved LV   systolic function; elevated LV filling pressure; calcified aortic   valve with very mild AS; mild AI; mild MR; moderate LAE;   moderately reduced RV function; mild TR with mildly elevated   pulmonary pressure.   Neuro/Psych CVA negative psych ROS   GI/Hepatic negative GI ROS, Neg liver ROS,   Endo/Other  diabetes   Renal/GU negative Renal ROS  negative genitourinary   Musculoskeletal negative musculoskeletal ROS (+)   Abdominal   Peds negative pediatric ROS (+)  Hematology negative hematology ROS (+)   Anesthesia Other Findings   Reproductive/Obstetrics negative OB ROS                             Anesthesia Physical Anesthesia Plan  ASA: III and emergent  Anesthesia Plan: General   Post-op Pain Management:    Induction: Intravenous  PONV Risk Score and Plan: 1 and Ondansetron, Dexamethasone and Treatment may vary due to age or medical condition  Airway Management Planned: Oral ETT  Additional Equipment: Arterial line  Intra-op Plan:   Post-operative Plan: Possible Post-op intubation/ventilation  Informed Consent: I have reviewed the patients History and Physical, chart, labs and discussed the procedure including the risks, benefits and alternatives for the proposed anesthesia with the patient or authorized representative who has indicated his/her understanding and acceptance.   Dental advisory given  Plan Discussed with: CRNA and Surgeon  Anesthesia Plan Comments:         Anesthesia Quick Evaluation

## 2017-06-26 NOTE — Anesthesia Procedure Notes (Signed)
Procedure Name: Intubation Date/Time: 06/26/2017 4:20 PM Performed by: Sampson Si E Pre-anesthesia Checklist: Patient identified, Emergency Drugs available, Suction available and Patient being monitored Patient Re-evaluated:Patient Re-evaluated prior to induction Oxygen Delivery Method: Circle System Utilized Preoxygenation: Pre-oxygenation with 100% oxygen Induction Type: IV induction Ventilation: Mask ventilation without difficulty Laryngoscope Size: Mac and 4 Grade View: Grade IV Tube type: Subglottic suction tube Tube size: 7.5 mm Number of attempts: 1 Airway Equipment and Method: Stylet Placement Confirmation: ETT inserted through vocal cords under direct vision,  positive ETCO2 and breath sounds checked- equal and bilateral Secured at: 22 cm Tube secured with: Tape Dental Injury: Teeth and Oropharynx as per pre-operative assessment

## 2017-06-26 NOTE — Progress Notes (Signed)
Pressure dressing applied to R femoral artery puncture site with folded 4X4 gauze and hypafix tape over the V Pad, gauze, tegaderm drsg that is CDI. Hematoma soft and marked by Dr. Kathi Ludwig himself. Groin level 2,  R DT + Doppler, R PT + Doppler. R leg sheeted to help keep limb immobile as to reduce the risk of rebleed. All of the above reviewed with admission nurse Emi Holes, RN. All questions answered.

## 2017-06-26 NOTE — ED Provider Notes (Signed)
[facility] DEPT Provider Note   CSN: [id number] Arrival date & time: 06/26/17  1418     History   Chief Complaint No chief complaint on file.   HPI Robert Keith is a 83 y.o. male.  Patient is an 83 year old male with past mental history of coronary artery disease, diabetes, and history of subdural hematoma. He presents today for evaluation of weakness. At approximately 1:15 this afternoon, he was witnessed by family members to develop the acute onset of unsteadiness on his feet, left-sided facial droop, left-sided weakness, and difficulty speaking. EMS was called and the patient was transported here in the form of a code stroke. He is a phasic and contributes no additional information. Majority of the history taken from the family members present at bedside.   The history is provided by the EMS personnel and a relative.    Past Medical History:  Diagnosis Date  . Cancer [facility])    prostate  . Coronary artery disease   . Diabetes mellitus without complication ([place])   . Essential tremor   . H. pylori infection   . Hypertension   . Intracranial hemorrhage [facility])     Patient Active Problem List   Diagnosis Date Noted  . Duodenal ulcer disease   . Acute blood loss anemia 09/17/2016  . Coronary artery disease due to lipid rich plaque s/p CABG 1996 09/17/2016  . Chronic atrial fibrillation ([place]) 09/17/2016  . History of subdural hematoma 09/17/2016  . Peptic ulcer disease 09/17/2016  . Type 2 diabetes mellitus with diabetic neuropathy, without long-term current use of insulin ([place]) 09/17/2016  . Essential hypertension 09/17/2016    Past Surgical History:  Procedure Laterality Date  . brain surgery for bleed    . cardiac bypass    . CORONARY ARTERY BYPASS GRAFT    . ESOPHAGOGASTRODUODENOSCOPY (EGD) WITH PROPOFOL N/A 09/18/2016   Procedure: ESOPHAGOGASTRODUODENOSCOPY (EGD) WITH PROPOFOL;  Surgeon: [name], MD;  Location: [facility];  Service: Endoscopy;   Laterality: N/A;       Home Medications    Prior to Admission medications   Medication Sig Start Date End Date Taking? Authorizing Provider  furosemide (LASIX) 20 MG tablet Take 1 tablet (20 mg total) by mouth daily as needed. 11/07/16 02/05/17  Minna Merritts, MD  glipiZIDE (GLUCOTROL) 5 MG tablet Take 5 mg by mouth daily.  07/02/16   [provider]  hydrochlorothiazide (HYDRODIURIL) 25 MG tablet Take 25 mg by mouth every other day.    [provider]  KLOR-CON M20 20 MEQ tablet TAKE 1 TABLET (20 MEQ TOTAL) BY MOUTH DAILY AS NEEDED. 06/11/17   Minna Merritts, MD  metFORMIN (GLUCOPHAGE) 1000 MG tablet Take 1,000 mg by mouth daily.  10/05/15   [provider]  metoprolol (LOPRESSOR) 100 MG tablet Take 50-100 mg by mouth See admin instructions. Take 100 mg by mouth in the morning and take 50 mg by mouth at lunch 07/16/16   [provider]  pantoprazole (PROTONIX) 40 MG tablet Take 1 tablet (40 mg total) by mouth 2 (two) times daily before a meal. 09/21/16   Ghimire, Henreitta Leber, MD  pravastatin (PRAVACHOL) 40 MG tablet Take 40 mg by mouth daily. 11/23/15   [provider]    Family History Family History  Problem Relation Age of Onset  . Stroke Mother   . Heart attack Father     Social History Social History  Substance Use Topics  . Smoking status: Former Research scientist (life sciences)  .  Smokeless tobacco: Never Used  . Alcohol use No     Allergies   Bupropion; Fenofibrate micronized; Gemfibrozil; Niacin; Phenobarbital; and Simvastatin   Review of Systems Review of Systems  Unable to perform ROS: Acuity of condition     Physical Exam Updated Vital Signs BP 136/80 (BP Location: Right Arm)   Pulse 72   Temp 97.7 F (36.5 C) (Oral)   Resp 19   Wt 86 kg (189 lb 9.5 oz)   SpO2 94%   BMI 25.71 kg/m   Physical Exam  Constitutional: He is oriented to person, place, and time. He appears well-developed and well-nourished. No distress.  HENT:    Head: Normocephalic and atraumatic.  Mouth/Throat: Oropharynx is clear and moist.  Neck: Normal range of motion. Neck supple.  Cardiovascular: Normal rate and regular rhythm.  Exam reveals no friction rub.   No murmur heard. Pulmonary/Chest: Effort normal and breath sounds normal. No respiratory distress. He has no wheezes. He has no rales.  Abdominal: Soft. Bowel sounds are normal. He exhibits no distension. There is no tenderness.  Musculoskeletal: Normal range of motion. He exhibits no edema.  Neurological: He is alert and oriented to person, place, and time. Coordination normal.  There is a left-sided facial droop noted. He is also a phasic. Neurologic exam is otherwise difficult to assess secondary to acuity of condition. He does appear to have a left-sided hemiparesis.  Skin: Skin is warm and dry. He is not diaphoretic.  Nursing note and vitals reviewed.    ED Treatments / Results  Labs (all labs ordered are listed, but only abnormal results are displayed) Labs Reviewed  CBC - Abnormal; Notable for the following:       Result Value   RBC 4.11 (*)    Hemoglobin 12.3 (*)    HCT 37.9 (*)    All other components within normal limits  I-STAT CHEM 8, ED - Abnormal; Notable for the following:    BUN 23 (*)    Glucose, Bld 133 (*)    Calcium, Ion 1.12 (*)    Hemoglobin 12.9 (*)    HCT 38.0 (*)    All other components within normal limits  PROTIME-INR  APTT  DIFFERENTIAL  COMPREHENSIVE METABOLIC PANEL  I-STAT TROPOININ, ED  CBG MONITORING, ED    EKG  EKG Interpretation None       Radiology Ct Head Code Stroke W/o Cm  Result Date: 06/26/2017 CLINICAL DATA:  Code stroke. 83 y/o M; right-sided weakness, aphasia, facial droop. EXAM: CT HEAD WITHOUT CONTRAST TECHNIQUE: Contiguous axial images were obtained from the base of the skull through the vertex without intravenous contrast. COMPARISON:  11/29/2016 MRI of the head and 12/07/2013 CT head. FINDINGS: Brain: No evidence  of acute infarction, new hemorrhage, hydrocephalus, new extra-axial collection or mass lesion/mass effect. Chronic left frontal lobe infarction. Stable left frontal subdural hematoma measuring up to 6 mm in thickness with mild mass effect on the underlying brain and attenuation slightly increased compared with CSF. No midline shift. Vascular: Density within the left distal M1 may represent thrombus (series 6, image 38). Skull: Postsurgical changes related to left frontal burr hole are stable. Sinuses/Orbits: Mild mucosal thickening in the right maxillary sinus. Otherwise negative. Other: Bilateral intra-ocular lens replacement. ASPECTS [facility] Stroke Program Early CT Score) - Ganglionic level infarction (caudate, lentiform nuclei, internal capsule, insula, M1-M3 cortex): 7 - Supraganglionic infarction (M4-M6 cortex): 3 Total score (0-10 with 10 being normal): 10 IMPRESSION: 1. Focal hyperdensity in left distal  M1 may represent thrombus. 2. No vascular territory infarct identified at this time. 3. Small left frontal subdural hematoma is stable in size and attenuation from 2014. 4. ASPECTS is 10 These results were called by telephone at the time of interpretation on 06/26/2017 at 2:40 pm to Dr. Leonie Man, who verbally acknowledged these results. Electronically Signed   By: Kristine Garbe M.D.   On: 06/26/2017 14:42    Procedures Procedures (including critical care time)  Medications Ordered in ED Medications  alteplase (ACTIVASE) 1 mg/mL infusion 77 mg (not administered)  iopamidol (ISOVUE-370) 76 % injection (50 mLs  Contrast Given 06/26/17 1445)  iopamidol (ISOVUE-370) 76 % injection (40 mLs  Contrast Given 06/26/17 1444)     Initial Impression / Assessment and Plan / ED Course  I have reviewed the triage vital signs and the nursing notes.  Pertinent labs & imaging results that were available during my care of the patient were reviewed by me and considered in my medical decision making (see  chart for details).  Patient brought as a code stroke with right facial droop, right arm, and right leg weakness. Initial head CT revealed a possible thrombus in the left MCA. Perfusion scan consistent with a patient who is amenable to thrombolytics.  Patient was seen along with Dr. Leonie Man who is overseeing the thrombolytics and making arrangements for IR. Patient appears to be protecting his airway and vital signs are otherwise stable.  CRITICAL CARE Performed by: Veryl Speak Total critical care time: 35 minutes Critical care time was exclusive of separately billable procedures and treating other patients. Critical care was necessary to treat or prevent imminent or life-threatening deterioration. Critical care was time spent personally by me on the following activities: development of treatment plan with patient and/or surrogate as well as nursing, discussions with consultants, evaluation of patient's response to treatment, examination of patient, obtaining history from patient or surrogate, ordering and performing treatments and interventions, ordering and review of laboratory studies, ordering and review of radiographic studies, pulse oximetry and re-evaluation of patient's condition.   Final Clinical Impressions(s) / ED Diagnoses   Final diagnoses:  Right sided weakness  Aphasia  Facial droop  Acute embolic stroke [facility])    New Prescriptions New Prescriptions   No medications on file     Veryl Speak, MD 06/29/17 0030

## 2017-06-26 NOTE — Progress Notes (Signed)
Call to pharmacy- they advised I call ED pharmacist calld Ed Pharmacist- tPA reversal is on national backorder and he will check with his clinical manager to see if we are able to get this medication. Dr Estanislado Pandy informed

## 2017-06-26 NOTE — Progress Notes (Signed)
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

## 2017-06-26 NOTE — Progress Notes (Signed)
Dr Estanislado Pandy requests that I call pharmacy to order TPA reversal so sheath can be removed.

## 2017-06-26 NOTE — ED Notes (Signed)
Pt to IR

## 2017-06-26 NOTE — H&P (Signed)
Admission H&P    Chief Complaint: code stroke HPI: Robert Keith is an 83 y.o. male  who is unable to provide history due to his stroke. History is obtained from EMS as well as patient's health power of attorney and daughter whom I spoke to over the phone. Patient was at home in his usual state of health until 1:30 PM when he had witnessed onset of fall with right facial droop, right hemiplegia and aphasia. Patient's condition remained unchanged". Blood sugar was 124 mg percent and vital signs are stable. I met the patient upon arrival in the ER and found him to have global aphasia with dense right hemiplegia and left gaze deviation. Noncontrast CT scan of the head was obtained which showed a hyperdense left middle cerebral artery and aspect score of 10. Patient had a chronic small left frontal subdural hematoma which appear to be unchanged from prior scan from 2014. Patient had prior history of subdural hematoma requiring craniotomy in 2014. At baseline he was fairly functional walking with a cane and managing his own affairs. Patient has history of atrial fibrillation but because of prior GI bleed has not been on anticoagulation as per the family.  IV tPA was initially started but the infusion was held till I spoke to the patient's daughter over the phone as well as health power of attorney and explained the increased risk involved with IV TPA and since intervention would likely be delayed for about an hour since he had another patient on the table getting mechanical thrombectomy. Other alternatives including transfer to force at [facility] also discussed with the family but it was clear that that would also involve a delay of an hour after careful discussion about increased risk of bleeding intracranially with IV TPA given prior history of subdural hematoma the daughter and health power of attorney agreed to give IV tPA. I also discussed the case with Dr. Demetra Shiner vascular neurologist for a second  opinion and he also agreed with my assessment and treatment plan. TPA infusion was then restarted after the above discussion. Interventional team was informed about the case after the CT angiogram confirmed left M1 occlusion and CT perfusion showed favorable characteristics for revascularization  LSN: 1:30 PM on 06/26/17 tPA Given: Yes NIH Stroke scale on admission 33 Modified Rankin scale 2  Past Medical History:  Diagnosis Date  . Cancer [facility])    prostate  . Coronary artery disease   . Diabetes mellitus without complication ([place])   . Essential tremor   . H. pylori infection   . Hypertension   . Intracranial hemorrhage [facility])     Past Surgical History:  Procedure Laterality Date  . brain surgery for bleed    . cardiac bypass    . CORONARY ARTERY BYPASS GRAFT    . ESOPHAGOGASTRODUODENOSCOPY (EGD) WITH PROPOFOL N/A 09/18/2016   Procedure: ESOPHAGOGASTRODUODENOSCOPY (EGD) WITH PROPOFOL;  Surgeon: [name], MD;  Location: [facility];  Service: Endoscopy;  Laterality: N/A;    Family History  Problem Relation Age of Onset  . Stroke Mother   . Heart attack Father    Social History:  reports that he has quit smoking. He has never used smokeless tobacco. He reports that he does not drink alcohol or use drugs.  Allergies:  Allergies  Allergen Reactions  . Bupropion Other (See Comments)    Unknown  . Fenofibrate Micronized Other (See Comments)    Unknown  . Gemfibrozil Other (See Comments)    Unknown  .  Niacin Other (See Comments)    impotence  . Phenobarbital Other (See Comments)    confusion  . Simvastatin Other (See Comments)    Myalgias     (Not in a hospital admission)  ROS: Not obtainable and positive for  only as stated in history of present illness Physical Examination: Blood pressure 136/80, pulse 72, temperature 97.7 F (36.5 C), temperature source Oral, resp. rate 19, weight 189 lb 9.5 oz (86 kg), SpO2 94 %. Frail elderly Caucasian male currently  not in distress. . Afebrile. Head is nontraumatic. Neck is supple without bruit.    Cardiac exam no murmur or gallop. Lungs are clear to auscultation. Distal pulses are well felt. Neurological Exam: Patient is sleepy but can be aroused and opens eyes. He has left gaze preference. He does not follow any commands and is globally aphasic. Pupils irregular but reactive. He blinks to threat on the left but not on the right. Right lower facial weakness. Tongue midline. Dense right hemiplegia with 0/5 strength with hypotonia and flaccidity. Purposeful antigravity movements on the left. Deep tendon reflexes depressed on the right and normal on the left. Right plantar upgoing left downgoing.     Results for orders placed or performed during the hospital encounter of 06/26/17 (from the past 48 hour(s))  Protime-INR     Status: None   Collection Time: 06/26/17  2:20 PM  Result Value Ref Range   Prothrombin Time 14.5 11.4 - 15.2 seconds   INR 1.13   APTT     Status: None   Collection Time: 06/26/17  2:20 PM  Result Value Ref Range   aPTT 29 24 - 36 seconds  CBC     Status: Abnormal   Collection Time: 06/26/17  2:20 PM  Result Value Ref Range   WBC 5.9 4.0 - 10.5 K/uL   RBC 4.11 (L) 4.22 - 5.81 MIL/uL   Hemoglobin 12.3 (L) 13.0 - 17.0 g/dL   HCT 37.9 (L) 39.0 - 52.0 %   MCV 92.2 78.0 - 100.0 fL   MCH 29.9 26.0 - 34.0 pg   MCHC 32.5 30.0 - 36.0 g/dL   RDW 15.0 11.5 - 15.5 %   Platelets 195 150 - 400 K/uL  Differential     Status: None   Collection Time: 06/26/17  2:20 PM  Result Value Ref Range   Neutrophils Relative % 52 %   Neutro Abs 3.1 1.7 - 7.7 K/uL   Lymphocytes Relative 27 %   Lymphs Abs 1.6 0.7 - 4.0 K/uL   Monocytes Relative 16 %   Monocytes Absolute 0.9 0.1 - 1.0 K/uL   Eosinophils Relative 5 %   Eosinophils Absolute 0.3 0.0 - 0.7 K/uL   Basophils Relative 0 %   Basophils Absolute 0.0 0.0 - 0.1 K/uL  Comprehensive metabolic panel     Status: Abnormal   Collection Time:  06/26/17  2:20 PM  Result Value Ref Range   Sodium 137 135 - 145 mmol/L   Potassium 4.4 3.5 - 5.1 mmol/L   Chloride 110 101 - 111 mmol/L   CO2 21 (L) 22 - 32 mmol/L   Glucose, Bld 135 (H) 65 - 99 mg/dL   BUN 20 6 - 20 mg/dL   Creatinine, Ser 1.03 0.61 - 1.24 mg/dL   Calcium 8.7 (L) 8.9 - 10.3 mg/dL   Total Protein 6.7 6.5 - 8.1 g/dL   Albumin 3.6 3.5 - 5.0 g/dL   AST 34 15 - 41 U/L   ALT  33 17 - 63 U/L   Alkaline Phosphatase 88 38 - 126 U/L   Total Bilirubin 0.9 0.3 - 1.2 mg/dL   GFR calc non Af Amer >60 >60 mL/min   GFR calc Af Amer >60 >60 mL/min    Comment: (NOTE) The eGFR has been calculated using the CKD EPI equation. This calculation has not been validated in all clinical situations. eGFR's persistently <60 mL/min signify possible Chronic Kidney Disease.    Anion gap 6 5 - 15  I-stat troponin, ED     Status: None   Collection Time: 06/26/17  2:43 PM  Result Value Ref Range   Troponin i, poc 0.00 0.00 - 0.08 ng/mL   Comment 3            Comment: Due to the release kinetics of cTnI, a negative result within the first hours of the onset of symptoms does not rule out myocardial infarction with certainty. If myocardial infarction is still suspected, repeat the test at appropriate intervals.   I-Stat Chem 8, ED     Status: Abnormal   Collection Time: 06/26/17  2:44 PM  Result Value Ref Range   Sodium 141 135 - 145 mmol/L   Potassium 4.4 3.5 - 5.1 mmol/L   Chloride 107 101 - 111 mmol/L   BUN 23 (H) 6 - 20 mg/dL   Creatinine, Ser 0.90 0.61 - 1.24 mg/dL   Glucose, Bld 133 (H) 65 - 99 mg/dL   Calcium, Ion 1.12 (L) 1.15 - 1.40 mmol/L   TCO2 24 0 - 100 mmol/L   Hemoglobin 12.9 (L) 13.0 - 17.0 g/dL   HCT 38.0 (L) 39.0 - 52.0 %   Ct Cerebral Perfusion W Contrast  Result Date: 06/26/2017 CLINICAL DATA:  Acute onset of right-sided weakness, aphasia, and facial droop. EXAM: CT PERFUSION BRAIN TECHNIQUE: Multiphase CT imaging of the brain was performed following IV bolus  contrast injection. Subsequent parametric perfusion maps were calculated using RAPID software. CONTRAST:  At 40 mL Isovue-M 370 COMPARISON:  CT head without contrast from the same. FINDINGS: CT Brain Perfusion Findings: CBF (<30%) Volume: 63m Perfusion (Tmax>6.0s) volume: 1853mMismatch Volume: 13329m Mismatch ratio is 3.7 Infarction Location:Left temporal lobe and frontal operculum. Left MCA territory. IMPRESSION: Left M1 emergent large vessel occlusion with associated acute left MCA territory infarct. Core infarct is estimated at 49 mL. The tissue at risk is estimated at 182 mL. These results were called by telephone at the time of interpretation on 06/26/2017 at 3:09 pm to Dr. PRAAntony Contraswho verbally acknowledged these results. Electronically Signed   By: ChrSan MorelleD.   On: 06/26/2017 15:16   Ct Head Code Stroke W/o Cm  Result Date: 06/26/2017 CLINICAL DATA:  Code stroke. [age] M; right-sided weakness, aphasia, facial droop. EXAM: CT HEAD WITHOUT CONTRAST TECHNIQUE: Contiguous axial images were obtained from the base of the skull through the vertex without intravenous contrast. COMPARISON:  11/29/2016 MRI of the head and 12/07/2013 CT head. FINDINGS: Brain: No evidence of acute infarction, new hemorrhage, hydrocephalus, new extra-axial collection or mass lesion/mass effect. Chronic left frontal lobe infarction. Stable left frontal subdural hematoma measuring up to 6 mm in thickness with mild mass effect on the underlying brain and attenuation slightly increased compared with CSF. No midline shift. Vascular: Density within the left distal M1 may represent thrombus (series 6, image 38). Skull: Postsurgical changes related to left frontal burr hole are stable. Sinuses/Orbits: Mild mucosal thickening in the right maxillary sinus. Otherwise  negative. Other: Bilateral intra-ocular lens replacement. ASPECTS [facility] Stroke Program Early CT Score) - Ganglionic level infarction (caudate, lentiform  nuclei, internal capsule, insula, M1-M3 cortex): 7 - Supraganglionic infarction (M4-M6 cortex): 3 Total score (0-10 with 10 being normal): 10 IMPRESSION: 1. Focal hyperdensity in left distal M1 may represent thrombus. 2. No vascular territory infarct identified at this time. 3. Small left frontal subdural hematoma is stable in size and attenuation from 2014. 4. ASPECTS is 10 These results were called by telephone at the time of interpretation on 06/26/2017 at 2:40 pm to Dr. Leonie Man, who verbally acknowledged these results. Electronically Signed   By: Kristine Garbe M.D.   On: 06/26/2017 14:42    Assessment: 83 y.o. male  with sudden onset of global aphasia and dense right hemiplegia and left gaze deviation due to large left middle cerebral artery infarct due to left middle cerebral artery occlusion etiology likely atrial fibrillation. Patient is not on long-term anticoagulation. Patient does have prior history of subdural hematoma with surgical evacuation with the chronic stable small residual left frontal subdural hematoma. The patient has presented with an time window 5 with thrombolyzes and mechanical thrombectomy. IV TPA carries increased risk for intracerebral hemorrhage given his prior history of subdural hematoma and chronic small stable subdural hematoma. I had a long discussion with the patient's health power of attorney was present at the bedside as well as I spoke to her daughter over the phone about this. Mechanical and lumpectomy unfortunately soon to be delayed at least an hour since we have another patient on the table at the present time. Transfer to the nearest competency stroke Center also will take the same amount of time hence in the interest of saving brain tissue giving IV tPA and excepting as slightly high risk of hemorrhage may be the best option. I also discussed the case with my colleague vascular neurologist Dr. Rory Percy who concurs.    Plan:  IV TPA with close neurological  monitoring and strict blood pressure control as per post TPA protocol followed by mechanical thrombectomy. Informed neuro interventional team. Admit to neurological intensive care unit under stroke team. Pulmonary critical care team to manage ventilatory issues and medical issues Long discussion with the patient's daughter, health power of attorney about ongoing care and answered questions. This patient is critically ill and at significant risk of neurological worsening, death and care requires constant monitoring of vital signs, hemodynamics,respiratory and cardiac monitoring, extensive review of multiple databases, frequent neurological assessment, discussion with family, other specialists and medical decision making of high complexity.I have made any additions or clarifications directly to the above note.This critical care time does not reflect procedure time, or teaching time or supervisory time of PA/NP/Med Resident etc but could involve care discussion time.  I spent 100 minutes of neurocritical care time  in the care of  this patient.  Antony Contras, MD Medical Director [facility] Stroke Center Pager: [phone] 06/26/2017 3:41 PM    06/26/2017, 3:26 PM

## 2017-06-26 NOTE — Transfer of Care (Signed)
Immediate Anesthesia Transfer of Care Note  Patient: Robert Keith  Procedure(s) Performed: Procedure(s): RADIOLOGY WITH ANESTHESIA CODE STROKE (N/A)  Patient Location: ICU  Anesthesia Type:General  Level of Consciousness: sedated and Patient remains intubated per anesthesia plan  Airway & Oxygen Therapy: Patient remains intubated per anesthesia plan and Patient placed on Ventilator (see vital sign flow sheet for setting)  Post-op Assessment: Report given to RN  Post vital signs: Reviewed and stable  Last Vitals:  Vitals:   06/26/17 1525 06/26/17 1530  BP: (!) 148/73 124/65  Pulse: 64 (!) 59  Resp: 16 12  Temp:      Last Pain:  Vitals:   06/26/17 1508  TempSrc: Oral         Complications: No apparent anesthesia complications

## 2017-06-26 NOTE — ED Provider Notes (Signed)
MSE was initiated and I personally evaluated the patient and placed orders (if any) at  2:20 PM on June 26, 2017.  The patient appears stable so that the remainder of the MSE may be completed by another provider. Patient seen at ambulance bay. Code stroke. Patient is alert handling secretions well. Airway patent He is stable for transfer to CT scanner and further evaluation by neurology and emergency staff   Orlie Dakin, MD 06/26/17 1421

## 2017-06-26 NOTE — Progress Notes (Signed)
Deveshwar in to speak with pt family. Daughter in Sports coach and caretaker at bedside

## 2017-06-26 NOTE — Progress Notes (Signed)
IR Tech Georgina Snell continuing to hold pressure to right groin. Level 1. Report called to 4N RN, advised that we will call when we are bringing pt up to floor. Called respiratory and advised we will need a ventilator in room upon arrival.

## 2017-06-26 NOTE — ED Notes (Addendum)
This RN received verbal order from Dr. Leonie Man to restart TPA at this time. Pharmacy notified

## 2017-06-26 NOTE — ED Notes (Addendum)
This RN received verbal order by Dr.Sethi to stop TPA at this time. TPA stopped and disconnected from pt. Bolus completed infusing prior to stopping infusion.

## 2017-06-26 NOTE — Progress Notes (Signed)
Nicole Kindred, pharmacist called back he recommends ordering fibrinogen level and potentially administering cryocipitant. Dr Estanislado Pandy is agreeable. Lab ordered, obtained and sent to lab

## 2017-06-26 NOTE — Progress Notes (Signed)
Pt arrived to IR nurses station, awaiting Dr Estanislado Pandy to assess pt and speak with family

## 2017-06-26 NOTE — ED Notes (Signed)
Got patient on the monitor into a gown did ekg shown to Dr Stark Jock

## 2017-06-26 NOTE — Progress Notes (Signed)
Spoke with Patient POA Robert Keith who verifies that patient has signed a DNR and wants to uphold his wishes.   [name], AGACNP-BC [facility]  Pgr: [phone]  PCCM Pgr: [phone]

## 2017-06-27 ENCOUNTER — Inpatient Hospital Stay (HOSPITAL_COMMUNITY): Payer: Medicare Other | Admitting: Certified Registered Nurse Anesthetist

## 2017-06-27 ENCOUNTER — Encounter (HOSPITAL_COMMUNITY)
Admission: EM | Disposition: A | Discharge status: Skilled Nursing Facility | Payer: Self-pay | Source: Home / Self Care | Attending: Neurology

## 2017-06-27 ENCOUNTER — Inpatient Hospital Stay (HOSPITAL_COMMUNITY): Payer: Medicare Other

## 2017-06-27 ENCOUNTER — Encounter (HOSPITAL_COMMUNITY): Payer: Medicare Other

## 2017-06-27 ENCOUNTER — Encounter (HOSPITAL_COMMUNITY): Payer: Self-pay | Admitting: Interventional Radiology

## 2017-06-27 DIAGNOSIS — S7011XA Contusion of right thigh, initial encounter: Secondary | ICD-10-CM

## 2017-06-27 DIAGNOSIS — Z9889 Other specified postprocedural states: Secondary | ICD-10-CM

## 2017-06-27 DIAGNOSIS — T81718A Complication of other artery following a procedure, not elsewhere classified, initial encounter: Secondary | ICD-10-CM

## 2017-06-27 DIAGNOSIS — R578 Other shock: Secondary | ICD-10-CM

## 2017-06-27 DIAGNOSIS — I724 Aneurysm of artery of lower extremity: Secondary | ICD-10-CM

## 2017-06-27 DIAGNOSIS — I639 Cerebral infarction, unspecified: Secondary | ICD-10-CM

## 2017-06-27 DIAGNOSIS — I97638 Postprocedural hematoma of a circulatory system organ or structure following other circulatory system procedure: Secondary | ICD-10-CM

## 2017-06-27 DIAGNOSIS — I6203 Nontraumatic chronic subdural hemorrhage: Secondary | ICD-10-CM

## 2017-06-27 DIAGNOSIS — J9601 Acute respiratory failure with hypoxia: Secondary | ICD-10-CM

## 2017-06-27 DIAGNOSIS — G936 Cerebral edema: Secondary | ICD-10-CM

## 2017-06-27 HISTORY — PX: FEMORAL ARTERY EXPLORATION: SHX5160

## 2017-06-27 LAB — CBC WITH DIFFERENTIAL/PLATELET
BASOS ABS: 0 10*3/uL (ref 0.0–0.1)
Basophils Relative: 0 %
EOS ABS: 0 10*3/uL (ref 0.0–0.7)
EOS PCT: 0 %
HCT: 26.9 % — ABNORMAL LOW (ref 39.0–52.0)
Hemoglobin: 8.8 g/dL — ABNORMAL LOW (ref 13.0–17.0)
LYMPHS ABS: 0.7 10*3/uL (ref 0.7–4.0)
LYMPHS PCT: 7 %
MCH: 30.1 pg (ref 26.0–34.0)
MCHC: 32.7 g/dL (ref 30.0–36.0)
MCV: 92.1 fL (ref 78.0–100.0)
MONO ABS: 0.9 10*3/uL (ref 0.1–1.0)
Monocytes Relative: 9 %
Neutro Abs: 9.4 10*3/uL — ABNORMAL HIGH (ref 1.7–7.7)
Neutrophils Relative %: 84 %
PLATELETS: 153 10*3/uL (ref 150–400)
RBC: 2.92 MIL/uL — AB (ref 4.22–5.81)
RDW: 15 % (ref 11.5–15.5)
WBC: 11.1 10*3/uL — AB (ref 4.0–10.5)

## 2017-06-27 LAB — BASIC METABOLIC PANEL
Anion gap: 5 (ref 5–15)
Anion gap: 10 (ref 5–15)
Anion gap: 8 (ref 5–15)
BUN: 16 mg/dL (ref 6–20)
BUN: 17 mg/dL (ref 6–20)
BUN: 19 mg/dL (ref 6–20)
CHLORIDE: 114 mmol/L — AB (ref 101–111)
CHLORIDE: 111 mmol/L (ref 101–111)
CO2: 20 mmol/L — ABNORMAL LOW (ref 22–32)
CO2: 12 mmol/L — ABNORMAL LOW (ref 22–32)
CO2: 19 mmol/L — AB (ref 22–32)
CREATININE: 1.33 mg/dL — AB (ref 0.61–1.24)
Calcium: 7.4 mg/dL — ABNORMAL LOW (ref 8.9–10.3)
Calcium: 7.3 mg/dL — ABNORMAL LOW (ref 8.9–10.3)
Calcium: 6.8 mg/dL — ABNORMAL LOW (ref 8.9–10.3)
Chloride: 114 mmol/L — ABNORMAL HIGH (ref 101–111)
Creatinine, Ser: 0.89 mg/dL (ref 0.61–1.24)
Creatinine, Ser: 1.5 mg/dL — ABNORMAL HIGH (ref 0.61–1.24)
GFR calc Af Amer: >60 mL/min (ref >60)
GFR calc Af Amer: 48 mL/min — ABNORMAL LOW (ref >60)
GFR calc non Af Amer: 48 mL/min — ABNORMAL LOW (ref >60)
GFR calc non Af Amer: 42 mL/min — ABNORMAL LOW (ref >60)
GFR, EST AFRICAN AMERICAN: 56 mL/min — AB (ref >60)
GLUCOSE: 165 mg/dL — AB (ref 65–99)
Glucose, Bld: 312 mg/dL — ABNORMAL HIGH (ref 65–99)
Glucose, Bld: 304 mg/dL — ABNORMAL HIGH (ref 65–99)
POTASSIUM: 4.8 mmol/L (ref 3.5–5.1)
Potassium: 3.9 mmol/L (ref 3.5–5.1)
Potassium: 4.6 mmol/L (ref 3.5–5.1)
SODIUM: 139 mmol/L (ref 135–145)
SODIUM: 138 mmol/L (ref 135–145)
Sodium: 136 mmol/L (ref 135–145)

## 2017-06-27 LAB — CBC
HCT: 26.6 % — ABNORMAL LOW (ref 39.0–52.0)
HEMATOCRIT: 29 % — AB (ref 39.0–52.0)
HEMOGLOBIN: 8.7 g/dL — AB (ref 13.0–17.0)
HEMOGLOBIN: 9.6 g/dL — AB (ref 13.0–17.0)
MCH: 29.6 pg (ref 26.0–34.0)
MCH: 28.9 pg (ref 26.0–34.0)
MCHC: 32.7 g/dL (ref 30.0–36.0)
MCHC: 33.1 g/dL (ref 30.0–36.0)
MCV: 90.5 fL (ref 78.0–100.0)
MCV: 87.3 fL (ref 78.0–100.0)
Platelets: 130 10*3/uL — ABNORMAL LOW (ref 150–400)
Platelets: 69 10*3/uL — ABNORMAL LOW (ref 150–400)
RBC: 2.94 MIL/uL — AB (ref 4.22–5.81)
RBC: 3.32 MIL/uL — AB (ref 4.22–5.81)
RDW: 15.4 % (ref 11.5–15.5)
RDW: 14.2 % (ref 11.5–15.5)
WBC: 14.8 10*3/uL — AB (ref 4.0–10.5)
WBC: 11.6 10*3/uL — ABNORMAL HIGH (ref 4.0–10.5)

## 2017-06-27 LAB — POCT I-STAT 7, (LYTES, BLD GAS, ICA,H+H)
Acid-base deficit: 8 mmol/L — ABNORMAL HIGH (ref 0.0–2.0)
Bicarbonate: 19.3 mmol/L — ABNORMAL LOW (ref 20.0–28.0)
Calcium, Ion: 0.7 mmol/L — CL (ref 1.15–1.40)
HCT: 21 % — ABNORMAL LOW (ref 39.0–52.0)
Hemoglobin: 7.1 g/dL — ABNORMAL LOW (ref 13.0–17.0)
O2 SAT: 100 %
PCO2 ART: 45.1 mmHg (ref 32.0–48.0)
PH ART: 7.234 — AB (ref 7.350–7.450)
PO2 ART: 474 mmHg — AB (ref 83.0–108.0)
Potassium: 5.5 mmol/L — ABNORMAL HIGH (ref 3.5–5.1)
Sodium: 144 mmol/L (ref 135–145)
TCO2: 21 mmol/L (ref 0–100)

## 2017-06-27 LAB — LIPID PANEL
Cholesterol: 115 mg/dL (ref 0–200)
HDL: 20 mg/dL — AB (ref >40)
LDL CALC: 78 mg/dL (ref 0–99)
TRIGLYCERIDES: 86 mg/dL (ref <150)
Total CHOL/HDL Ratio: 5.8 RATIO
VLDL: 17 mg/dL (ref 0–40)

## 2017-06-27 LAB — POCT I-STAT 3, ART BLOOD GAS (G3+)
Acid-Base Excess: 12 mmol/L — ABNORMAL HIGH (ref 0.0–2.0)
Bicarbonate: 35 mmol/L — ABNORMAL HIGH (ref 20.0–28.0)
O2 Saturation: 100 %
PCO2 ART: 34.5 mmHg (ref 32.0–48.0)
PO2 ART: 183 mmHg — AB (ref 83.0–108.0)
Patient temperature: 97.8
TCO2: 36 mmol/L (ref 0–100)
pH, Arterial: 7.613 (ref 7.350–7.450)

## 2017-06-27 LAB — PROTIME-INR
INR: 1.67
INR: 1.59
PROTHROMBIN TIME: 19.2 s — AB (ref 11.4–15.2)
Prothrombin Time: 19.9 seconds — ABNORMAL HIGH (ref 11.4–15.2)

## 2017-06-27 LAB — PREPARE RBC (CROSSMATCH)

## 2017-06-27 LAB — MAGNESIUM: MAGNESIUM: 1.7 mg/dL (ref 1.7–2.4)

## 2017-06-27 LAB — APTT
aPTT: 31 seconds (ref 24–36)
aPTT: 31 seconds (ref 24–36)

## 2017-06-27 LAB — HEMOGLOBIN AND HEMATOCRIT, BLOOD
HCT: 25.6 % — ABNORMAL LOW (ref 39.0–52.0)
HEMATOCRIT: 28.1 % — AB (ref 39.0–52.0)
HEMOGLOBIN: 8.2 g/dL — AB (ref 13.0–17.0)
Hemoglobin: 9.1 g/dL — ABNORMAL LOW (ref 13.0–17.0)

## 2017-06-27 LAB — GLUCOSE, CAPILLARY
GLUCOSE-CAPILLARY: 136 mg/dL — AB (ref 65–99)
GLUCOSE-CAPILLARY: 189 mg/dL — AB (ref 65–99)
GLUCOSE-CAPILLARY: 244 mg/dL — AB (ref 65–99)
GLUCOSE-CAPILLARY: 271 mg/dL — AB (ref 65–99)
GLUCOSE-CAPILLARY: 226 mg/dL — AB (ref 65–99)
Glucose-Capillary: 175 mg/dL — ABNORMAL HIGH (ref 65–99)

## 2017-06-27 LAB — TRIGLYCERIDES: Triglycerides: 89 mg/dL (ref <150)

## 2017-06-27 LAB — LACTIC ACID, PLASMA: Lactic Acid, Venous: 5.1 mmol/L (ref 0.5–1.9)

## 2017-06-27 LAB — PHOSPHORUS: Phosphorus: 3.2 mg/dL (ref 2.5–4.6)

## 2017-06-27 LAB — TROPONIN I

## 2017-06-27 LAB — MRSA PCR SCREENING: MRSA by PCR: NEGATIVE

## 2017-06-27 SURGERY — EXPLORATION, ARTERY, FEMORAL
Anesthesia: General | Site: Groin | Laterality: Right

## 2017-06-27 MED ORDER — CHLORHEXIDINE GLUCONATE 0.12% ORAL RINSE (MEDLINE KIT)
15.0000 mL | Freq: Two times a day (BID) | OROMUCOSAL | Status: DC
  Administered 2017-06-27 – 2017-07-01 (×9): 15 mL via OROMUCOSAL

## 2017-06-27 MED ORDER — ORAL CARE MOUTH RINSE
15.0000 mL | OROMUCOSAL | Status: DC
  Administered 2017-06-27 – 2017-07-01 (×44): 15 mL via OROMUCOSAL

## 2017-06-27 MED ORDER — PROPOFOL 10 MG/ML IV BOLUS
INTRAVENOUS | Status: AC
  Filled 2017-06-27: qty 20

## 2017-06-27 MED ORDER — FENTANYL CITRATE (PF) 250 MCG/5ML IJ SOLN
INTRAMUSCULAR | Status: AC
  Filled 2017-06-27: qty 5

## 2017-06-27 MED ORDER — CEFAZOLIN SODIUM 1 G IJ SOLR
INTRAMUSCULAR | Status: DC | PRN
  Administered 2017-06-27: 2 g via INTRAMUSCULAR

## 2017-06-27 MED ORDER — SODIUM CHLORIDE 0.9 % IV SOLN
0.0300 [IU]/min | INTRAVENOUS | Status: DC
  Administered 2017-06-27 (×2): 0.03 [IU]/min via INTRAVENOUS
  Filled 2017-06-27: qty 2

## 2017-06-27 MED ORDER — ROCURONIUM BROMIDE 100 MG/10ML IV SOLN
INTRAVENOUS | Status: DC | PRN
  Administered 2017-06-27: 50 mg via INTRAVENOUS

## 2017-06-27 MED ORDER — SODIUM CHLORIDE 0.9 % IV SOLN
INTRAVENOUS | Status: DC | PRN
  Administered 2017-06-27: 17:00:00

## 2017-06-27 MED ORDER — DEXMEDETOMIDINE HCL 200 MCG/2ML IV SOLN
0.0000 ug/kg/h | INTRAVENOUS | Status: DC
  Administered 2017-06-27: 0.5 ug/kg/h via INTRAVENOUS
  Administered 2017-06-27 – 2017-06-28 (×3): 0.4 ug/kg/h via INTRAVENOUS
  Filled 2017-06-27 (×3): qty 2

## 2017-06-27 MED ORDER — SODIUM CHLORIDE 0.9 % IV SOLN: Freq: Once | INTRAVENOUS | Status: DC

## 2017-06-27 MED ORDER — SODIUM BICARBONATE 8.4 % IV SOLN
50.0000 meq | INTRAVENOUS | Status: AC
  Administered 2017-06-27: 50 meq via INTRAVENOUS

## 2017-06-27 MED ORDER — MIDAZOLAM HCL 2 MG/2ML IJ SOLN
1.0000 mg | INTRAMUSCULAR | Status: DC | PRN
Start: 2017-06-27 — End: 2017-07-01
  Administered 2017-06-29 – 2017-07-01 (×2): 1 mg via INTRAVENOUS
  Filled 2017-06-27 (×2): qty 2

## 2017-06-27 MED ORDER — SODIUM BICARBONATE 8.4 % IV SOLN
INTRAVENOUS | Status: AC
  Administered 2017-06-27: 50 meq via INTRAVENOUS
  Filled 2017-06-27: qty 50

## 2017-06-27 MED ORDER — SODIUM CHLORIDE 0.9 % IV SOLN
Freq: Once | INTRAVENOUS | Status: DC
Start: 2017-06-27 — End: 2017-07-02

## 2017-06-27 MED ORDER — NOREPINEPHRINE BITARTRATE 1 MG/ML IV SOLN
0.0000 ug/min | INTRAVENOUS | Status: DC
  Administered 2017-06-27: 2 ug/min via INTRAVENOUS
  Administered 2017-06-27: 20 ug/min via INTRAVENOUS
  Administered 2017-06-27: 28 ug/min via INTRAVENOUS
  Administered 2017-06-28: 22 ug/min via INTRAVENOUS
  Administered 2017-06-28 (×2): 14 ug/min via INTRAVENOUS
  Filled 2017-06-27 (×7): qty 4

## 2017-06-27 MED ORDER — FENTANYL CITRATE (PF) 100 MCG/2ML IJ SOLN: 50.0000 ug | INTRAMUSCULAR | Status: DC | PRN

## 2017-06-27 MED ORDER — SODIUM BICARBONATE 8.4 % IV SOLN
INTRAVENOUS | Status: DC
  Filled 2017-06-27 (×2): qty 150

## 2017-06-27 MED ORDER — HEMOSTATIC AGENTS (NO CHARGE) OPTIME
TOPICAL | Status: DC | PRN
  Administered 2017-06-27: 1 via TOPICAL

## 2017-06-27 MED ORDER — ROCURONIUM BROMIDE 50 MG/5ML IV SOLN
INTRAVENOUS | Status: AC
  Filled 2017-06-27: qty 2

## 2017-06-27 MED ORDER — PHENYLEPHRINE HCL 10 MG/ML IJ SOLN
INTRAMUSCULAR | Status: DC | PRN
  Administered 2017-06-27: 100 ug/min via INTRAVENOUS

## 2017-06-27 MED ORDER — FENTANYL CITRATE (PF) 100 MCG/2ML IJ SOLN
INTRAMUSCULAR | Status: DC | PRN
Start: 2017-06-27 — End: 2017-06-27
  Administered 2017-06-27 (×3): 100 ug via INTRAVENOUS
  Administered 2017-06-27: 50 ug via INTRAVENOUS

## 2017-06-27 MED ORDER — SODIUM CHLORIDE 0.9 % IV SOLN
Freq: Once | INTRAVENOUS | Status: AC
  Administered 2017-06-27: 13:00:00 via INTRAVENOUS

## 2017-06-27 MED ORDER — SODIUM CHLORIDE 0.9 % IV BOLUS (SEPSIS)
500.0000 mL | Freq: Once | INTRAVENOUS | Status: AC
  Administered 2017-06-27: 500 mL via INTRAVENOUS

## 2017-06-27 MED ORDER — 0.9 % SODIUM CHLORIDE (POUR BTL) OPTIME
TOPICAL | Status: DC | PRN
  Administered 2017-06-27: 2000 mL

## 2017-06-27 SURGICAL SUPPLY — 63 items
BANDAGE ACE 4X5 VEL STRL LF (GAUZE/BANDAGES/DRESSINGS) IMPLANT
BANDAGE ESMARK 6X9 LF (GAUZE/BANDAGES/DRESSINGS) IMPLANT
BIOPATCH RED 1 DISK 7.0 (GAUZE/BANDAGES/DRESSINGS) ×4 IMPLANT
BIOPATCH RED 1IN DISK 7.0MM (GAUZE/BANDAGES/DRESSINGS) ×2
BNDG ESMARK 6X9 LF (GAUZE/BANDAGES/DRESSINGS)
CANISTER SUCT 3000ML PPV (MISCELLANEOUS) ×3 IMPLANT
CANNULA VESSEL 3MM 2 BLNT TIP (CANNULA) IMPLANT
CLIP TI MEDIUM 24 (CLIP) ×3 IMPLANT
CLIP TI WIDE RED SMALL 24 (CLIP) ×3 IMPLANT
COVER PROBE W GEL 5X96 (DRAPES) ×3 IMPLANT
CUFF TOURNIQUET SINGLE 24IN (TOURNIQUET CUFF) IMPLANT
CUFF TOURNIQUET SINGLE 34IN LL (TOURNIQUET CUFF) IMPLANT
CUFF TOURNIQUET SINGLE 44IN (TOURNIQUET CUFF) IMPLANT
DRAIN CHANNEL 15F RND FF W/TCR (WOUND CARE) IMPLANT
DRAPE C-ARM 42X72 X-RAY (DRAPES) IMPLANT
DRSG TEGADERM 4X4.75 (GAUZE/BANDAGES/DRESSINGS) ×12 IMPLANT
ELECT REM PT RETURN 9FT ADLT (ELECTROSURGICAL) ×3
ELECTRODE REM PT RTRN 9FT ADLT (ELECTROSURGICAL) ×1 IMPLANT
EVACUATOR SILICONE 100CC (DRAIN) ×6 IMPLANT
GAUZE SPONGE 4X4 12PLY STRL (GAUZE/BANDAGES/DRESSINGS) ×3 IMPLANT
GLOVE BIO SURGEON STRL SZ7 (GLOVE) ×3 IMPLANT
GLOVE BIOGEL PI IND STRL 6.5 (GLOVE) ×3 IMPLANT
GLOVE BIOGEL PI IND STRL 7.5 (GLOVE) ×2 IMPLANT
GLOVE BIOGEL PI INDICATOR 6.5 (GLOVE) ×6
GLOVE BIOGEL PI INDICATOR 7.5 (GLOVE) ×4
GLOVE ECLIPSE 6.5 STRL STRAW (GLOVE) ×3 IMPLANT
GLOVE ECLIPSE 7.0 STRL STRAW (GLOVE) ×3 IMPLANT
GLOVE SURG SS PI 6.5 STRL IVOR (GLOVE) ×3 IMPLANT
GOWN STRL NON-REIN LRG LVL3 (GOWN DISPOSABLE) ×3 IMPLANT
GOWN STRL REUS W/ TWL LRG LVL3 (GOWN DISPOSABLE) ×3 IMPLANT
GOWN STRL REUS W/TWL LRG LVL3 (GOWN DISPOSABLE) ×6
HEMOSTAT SPONGE AVITENE ULTRA (HEMOSTASIS) ×3 IMPLANT
INSERT FOGARTY SM (MISCELLANEOUS) IMPLANT
KIT BASIN OR (CUSTOM PROCEDURE TRAY) ×3 IMPLANT
KIT ROOM TURNOVER OR (KITS) ×3 IMPLANT
MARKER GRAFT CORONARY BYPASS (MISCELLANEOUS) IMPLANT
NS IRRIG 1000ML POUR BTL (IV SOLUTION) ×6 IMPLANT
PACK PERIPHERAL VASCULAR (CUSTOM PROCEDURE TRAY) ×3 IMPLANT
PAD ARMBOARD 7.5X6 YLW CONV (MISCELLANEOUS) ×6 IMPLANT
SPONGE INTESTINAL PEANUT (DISPOSABLE) ×3 IMPLANT
SPONGE LAP 18X18 X RAY DECT (DISPOSABLE) ×6 IMPLANT
SPONGE LAP 4X18 X RAY DECT (DISPOSABLE) ×3 IMPLANT
STAPLER VISISTAT 35W (STAPLE) ×3 IMPLANT
SUT ETHILON 3 0 PS 1 (SUTURE) ×6 IMPLANT
SUT GORETEX 5 0 TT13 24 (SUTURE) IMPLANT
SUT GORETEX 6.0 TT13 (SUTURE) IMPLANT
SUT MNCRL AB 4-0 PS2 18 (SUTURE) ×3 IMPLANT
SUT PROLENE 5 0 C 1 24 (SUTURE) ×6 IMPLANT
SUT PROLENE 6 0 BV (SUTURE) ×12 IMPLANT
SUT PROLENE 7 0 BV 1 (SUTURE) IMPLANT
SUT SILK 2 0 FS (SUTURE) ×3 IMPLANT
SUT SILK 3 0 (SUTURE)
SUT SILK 3-0 18XBRD TIE 12 (SUTURE) IMPLANT
SUT VIC AB 2-0 CT1 27 (SUTURE) ×2
SUT VIC AB 2-0 CT1 TAPERPNT 27 (SUTURE) ×1 IMPLANT
SUT VIC AB 3-0 SH 27 (SUTURE) ×2
SUT VIC AB 3-0 SH 27X BRD (SUTURE) ×1 IMPLANT
TAPE CLOTH SURG 4X10 WHT LF (GAUZE/BANDAGES/DRESSINGS) ×3 IMPLANT
TRAY FOLEY W/METER SILVER 16FR (SET/KITS/TRAYS/PACK) IMPLANT
TUBING EXTENTION W/L.L. (IV SETS) ×6 IMPLANT
UNDERPAD 30X30 (UNDERPADS AND DIAPERS) ×3 IMPLANT
WATER STERILE IRR 1000ML POUR (IV SOLUTION) ×3 IMPLANT
YANKAUER SUCT BULB TIP NO VENT (SUCTIONS) ×3 IMPLANT

## 2017-06-27 NOTE — Progress Notes (Signed)
Dr. Estanislado Pandy notified of increase in size of right groin hematoma, along with decrease in BP and H&H. Orders received for stat vascular ultrasound of right groin.

## 2017-06-27 NOTE — Anesthesia Preprocedure Evaluation (Signed)
Anesthesia Evaluation  Patient identified by MRN, date of birth, ID band Patient unresponsive  General Assessment Comment:Straight to OR ,shock , Fem artery bleed , on pressors  Airway Mallampati: Intubated       Dental no notable dental hx.    Pulmonary former smoker,    + rhonchi        Cardiovascular hypertension,  Rhythm:Irregular Rate:Tachycardia     Neuro/Psych    GI/Hepatic   Endo/Other  diabetes  Renal/GU      Musculoskeletal   Abdominal   Peds  Hematology   Anesthesia Other Findings   Reproductive/Obstetrics                             Anesthesia Physical Anesthesia Plan  ASA: IV and emergent  Anesthesia Plan: General   Post-op Pain Management:    Induction:   PONV Risk Score and Plan: 2 and Ondansetron and Dexamethasone  Airway Management Planned: Oral ETT  Additional Equipment:   Intra-op Plan:   Post-operative Plan: Post-operative intubation/ventilation  Informed Consent: I have reviewed the patients History and Physical, chart, labs and discussed the procedure including the risks, benefits and alternatives for the proposed anesthesia with the patient or authorized representative who has indicated his/her understanding and acceptance.     Plan Discussed with:   Anesthesia Plan Comments:         Anesthesia Quick Evaluation

## 2017-06-27 NOTE — Progress Notes (Signed)
CTA neck performed 06/26/17. Please advise if carotid duplex is still needed.  Landry Mellow, [place], RVT 06/27/2017 Vascular lab [phone]

## 2017-06-27 NOTE — Progress Notes (Signed)
STROKE TEAM PROGRESS NOTE   HISTORY OF PRESENT ILLNESS (per record) [name] is a 83 y.o. male with history of prostate cancer, CAD, DM2, essential tremor, hypertension, and subdural hematoma, s/p craniectomy and clot evacuation who presented with right facial droop, dense right hemiplegia global aphasia, and left gaze deviation. He recevied tPA at 1507 on 06/26/2017.  Robert Keith was unable to provide history due to his stroke. History is obtained from EMS as well as patient's health power of attorney and daughter whom I spoke to over the phone. Patient was at home in his usual state of health until 1:30 PM when he had witnessed onset of fall with right facial droop, dense right hemiplegia global aphasia, and left gaze deviation. CT head with hyperdense left MCA and ASPECTS score of 10. Patient had a chronic small left frontal subdural hematoma which appear to be unchanged from prior scan from 2014. Patient had prior history of subdural hematoma requiring craniotomy in 2014. At baseline he was fairly functional, walking with a cane and managing his own affairs. Patient has history of atrial fibrillation but because of prior GI bleed has not been on anticoagulation as per the family.  IV tPA was initially started but the infusion was held till I spoke to the patient's daughter over the phone as well as health power of attorney and explained the increased risk involved with IV TPA and since intervention would likely be delayed for about an hour since he had another patient on the table getting mechanical thrombectomy. Other alternatives including transfer to [facility], discussed with the family but it was clear that that would also involve a delay of an hour.  After careful discussion about increased risk of bleeding intracranially with IV tPA given prior history of subdural hematoma the daughter and health power of attorney agreed to give IV tPA. I also discussed the case with Dr. Demetra Shiner,  vascular neurologist, for a second opinion and he agreed with my assessment and treatment plan. TPA infusion was then restarted after the above discussion.  Interventional team was informed about the case after the CT angiogram confirmed left M1 occlusion and CT perfusion showed favorable characteristics for revascularization.  The patient was revascularized in IR without complication.     LSN: 1:30 PM on 06/26/17 tPA Given: Yes NIH Stroke scale on admission 33 Modified Rankin scale 2  Patient was administered IV t-PA at 1507 on 06/26/2017. He was admitted to the neuro ICU for further evaluation and treatment.   SUBJECTIVE (INTERVAL HISTORY) His family is at the bedside.   MRI shows moderate sized left MCA infarct with with mild swelling and left ventricular effacement, but no midline shift.  Discussed MRI images with family, and the high likelihood of poor prognosis given the extent of the stroke.  Family expressed understanding, and intends to keep the patient DNR status.  Family agreeable to the plan of watching the patient one or two more days for possible improvement, and then extubating.   OBJECTIVE Temp:  [97.6 F (36.4 C)-97.8 F (36.6 C)] 97.8 F (36.6 C) (07/12 0400) Pulse Rate:  [40-92] 74 (07/12 0715) Cardiac Rhythm: Atrial fibrillation (07/12 0400) Resp:  [12-19] 15 (07/12 0715) BP: (89-148)/(47-105) 102/48 (07/12 0700) SpO2:  [94 %-100 %] 100 % (07/12 0715) FiO2 (%):  [40 %-100 %] 40 % (07/12 0403) Weight:  [86 kg (189 lb 9.5 oz)-91.4 kg (201 lb 8 oz)] 91.4 kg (201 lb 8 oz) (07/12 0500)  CBC:  Recent Labs Lab 06/26/17 1420  06/27/17 0314 06/27/17 0728  WBC 5.9  --  11.1*  --   NEUTROABS 3.1  --  9.4*  --   HGB 12.3*  < > 8.8* 9.1*  HCT 37.9*  < > 26.9* 28.1*  MCV 92.2  --  92.1  --   PLT 195  --  153  --   < > = values in this interval not displayed.  Basic Metabolic Panel:  Recent Labs Lab 06/26/17 1420 06/26/17 1444 06/27/17 0314  NA 137 141 139  K 4.4  4.4 3.9  CL 110 107 114*  CO2 21*  --  20*  GLUCOSE 135* 133* 165*  BUN 20 23* 16  CREATININE 1.03 0.90 0.89  CALCIUM 8.7*  --  7.4*  MG  --   --  1.7  PHOS  --   --  3.2    Lipid Panel:    Component Value Date/Time   CHOL 115 06/27/2017 0500   TRIG 86 06/27/2017 0500   HDL 20 (L) 06/27/2017 0500   CHOLHDL 5.8 06/27/2017 0500   VLDL 17 06/27/2017 0500   LDLCALC 78 06/27/2017 0500   HgbA1c: No results found for: HGBA1C Urine Drug Screen: No results found for: LABOPIA, COCAINSCRNUR, LABBENZ, AMPHETMU, THCU, LABBARB  Alcohol Level No results found for: [facility]  IMAGING  Ct Abdomen Pelvis Wo Contrast 06/27/2017 IMPRESSION: 1. Hematoma within the proximal RIGHT anterior thigh is incompletely imaged. Hematoma likely spontaneous related to anticoagulation. Recommend clinical correlation with extent of hematoma in the RIGHT thigh. 2. No intraperitoneal or retroperitoneal hemorrhage. 3. Extensive diverticulosis without diverticulitis. 4.  Aortic Atherosclerosis (ICD10-I70.0). 5. Small bilateral pleural effusions. These results will be called to the ordering clinician or representative by the Radiologist Assistant, and communication documented in the PACS or zVision Dashboard.  Ct Angio Head W and Wo Contrast 06/26/2017 IMPRESSION: 1. Emergent large vessel occlusion of the left M1 segment. 2. There is some reconstitution of anterior left MCA branch vessels from pial collaterals. 3. Dense calcifications at the carotid bifurcations bilaterally without significant stenoses relative to the more distal vessels. 4. Moderate narrowing of the proximal posterior cerebral arteries bilaterally, right greater than left. 5. Calcifications at great vessel origins along the arch without significant stenosis. 6. Hypoplastic left vertebral artery. 7. Multilevel cervical spinal spondylosis. 8. Emphysema. 9. Prior granulomatous disease in the right upper lobe.  Mr Brain Wo Contrast 06/27/2017 IMPRESSION: Completed  acute infarction affecting the majority of the inferior division left MCA distribution. This affects the left temporal lobe and posterior frontal lobe with patchy involvement in the parietal region. Infarction of the caudate and putamen as well. Petechial blood product deposition without focal hematoma. Swelling but no shift at this time.  Ct Cerebral Perfusion W Contrast 06/26/2017 IMPRESSION: Left M1 emergent large vessel occlusion with associated acute left MCA territory infarct. Core infarct is estimated at 49 mL. The tissue at risk is estimated at 182 mL.   Dg Chest Port 1 View 06/27/2017 IMPRESSION: 1. Well-positioned endotracheal tube. 2. Stable patchy opacity and volume loss at the left lung base, favor scarring or atelectasis. 3. Probable stable trace left pleural effusion.   Dg Chest Port 1 View 06/26/2017 IMPRESSION: Endotracheal tube 3 cm above the carina. Left lower lobe atelectasis or infiltrate.   Ct Head Code Stroke W/o Cm 06/26/2017 IMPRESSION: 1. Focal hyperdensity in left distal M1 may represent thrombus. 2. No vascular territory infarct identified at this time. 3. Small left frontal subdural  hematoma is stable in size and attenuation from 2014. 4. ASPECTS is 10    PHYSICAL EXAM Frail elderly Caucasian male who is intubated. He has right   thigh hematoma. . Afebrile. Head is nontraumatic. Neck is supple without bruit.    Cardiac exam no murmur or gallop. Lungs are clear to auscultation. Distal pulses are well felt. Neurological Exam :  Patient is stuporous with eyes closed. Opens eyes partially to sternal rub. Left gaze preference dorsi movements sluggish. Pupils irregular but reactive. Does not blink to threat on either side. Right lower facial asymmetry. Tongue midline. Patient does withdraw left upper and lower extremity due to painful stimuli. Trace withdrawal of the right lower extremity to pain but none in the left upper right upper extremity. Tone is diminished on the  right side. Right plantar equivocal left downgoing ASSESSMENT/PLAN Robert Keith is a 83 y.o. male with history of prostate cancer, CAD, DM2, essential tremor, hypertension, and subdural hematoma, s/p craniectomy and clot evacuation who presented with right facial droop, dense right hemiplegia global aphasia, and left gaze deviation. He recevied tPA at 1507 on 06/26/2017.     Stroke: Emergent large vessel occlusion of the left M1 segment with some reconstitution of anterior left MCA branch vessels from pial collateral in the setting of densely calcified bilateral carotid bifurcations and diffuse large vessel athersclerosis   Resultant  aphasia and right hemiplegia  CT head:  Focal hyperdensity in left distal M1 may represent thrombus. 2. No vascular territory infarct identified at this time. 3. Small left frontal subdural hematoma is stable in size and attenuation from 2014. 4. ASPECTS is 10   MRI head: Completed acute infarction affecting the majority of the inferior division left MCA distribution. This affects the left temporal lobe and posterior frontal lobe with patchy involvement in the parietal region. Infarction of the caudate and putamen as well. Petechial blood product deposition without focal hematoma. Swelling but no shift at this time.  CT Angio Head: Emergent large vessel occlusion of the left M1 segment. 2. There is some reconstitution of anterior left MCA branch vessels from pial collaterals. 3. Dense calcifications at the carotid bifurcations bilaterally without significant stenoses relative to the more distal vessels. 4. Moderate narrowing of the proximal posterior cerebral arteries bilaterally, right greater than left. 5. Calcifications at great vessel origins along the arch without significant stenosis. 6. Hypoplastic left vertebral artery. 7. Multilevel cervical spinal spondylosis. 8. Emphysema. 9. Prior granulomatous disease in the right upper lobe.  Carotid Doppler  not  ordered  2D Echo   pending   LDL 78  HgbA1c pending   SCDs for VTE prophylaxis  Diet NPO time specified  No antithrombotic prior to admission, now on No antithrombotic  Patient counseled to be compliant with his antithrombotic medications  Ongoing aggressive stroke risk factor management  Therapy recommendations:  pending  Disposition:   ending  Hyperlipidemia  Home meds: pravastatin 40mg  PO daily  LDL 78, goal < 70  Statin held due to [place]  Continue statin at discharge  Diabetes  HgbA1c  pending, goal < 7.0  Controlled  Other Stroke Risk Factors  Advanced age  Overweight, Body mass index is 29.76 kg/m., recommend weight loss, diet and exercise as appropriate   Family hx stroke (Mother)  Coronary artery disease  Other Active Problems  Right thigh hematoma post interventional procedure with anemia and mild hypotension  Hospital day # 1  I have personally examined this patient, reviewed notes, independently viewed  imaging studies, participated in medical decision making and plan of care.ROS completed by me personally and pertinent positives fully documented  I have made any additions or clarifications directly to the above note.  The patient presented with left MCA infarct and underwent thrombolyzes with IV TPA and successful mechanical thrombectomy however MRI scan shows moderate size infarct and he also has a right thigh hematoma postprocedure. Recommend hemodynamic support with fluids, vasopressors and blood transfusion. Neuro interventional team to follow thigh hematoma and possible vascular surgery consultation if needed. I had a long discussion with the patient's son and daughter at the bedside as well as health power of attorney and recommend continuing aggressive care for the next few days to see if patient's condition improves. The family is quite clear the did not want prolonged ventilatory support and if his condition deteriorated further with them may  consider withdrawal of care. This patient is critically ill and at significant risk of neurological worsening, death and care requires constant monitoring of vital signs, hemodynamics,respiratory and cardiac monitoring, extensive review of multiple databases, frequent neurological assessment, discussion with family, other specialists and medical decision making of high complexity.I have made any additions or clarifications directly to the above note.This critical care time does not reflect procedure time, or teaching time or supervisory time of PA/NP/Med Resident etc but could involve care discussion time. Discussed with Dr. Estanislado Pandy and Elsworth Soho  I spent 100 minutes of neurocritical care time  in the care of  this patient.      Antony Contras, MD Medical Director [facility] Pager: [phone] 06/27/2017 3:50 PM   To contact Stroke Continuity provider, please refer to [email or web address] After hours, contact General Neurology

## 2017-06-27 NOTE — Progress Notes (Signed)
Initial Nutrition Assessment  DOCUMENTATION CODES:   Not applicable  INTERVENTION:   -If unable to extubate, recommend:  Initiate TF via OGT with Vital AF 1.2 at goal rate of 65 ml/h (1560 ml per day) to provide 1872 kcals, 117 gm protein, 1265 ml free water daily.  NUTRITION DIAGNOSIS:   Inadequate oral intake related to inability to eat as evidenced by NPO status.  GOAL:   Patient will meet greater than or equal to 90% of their needs  MONITOR:   Vent status, Labs, Weight trends, Skin, I & O's  REASON FOR ASSESSMENT:   Ventilator    ASSESSMENT:   83 year old male with past medical history of afib (not on NOAC 2/2 GI bleed), HTN, prior SDH in 2014 that required evacuation with residual small chronic SDH), DM, CAD, tremor, H. Pylori infection, and prostate cancer who presented 7/11 from home with witnessed onset of right facial droop, right hemiplegia and aphasia at 1330.   7/11- right femoral sheath found to be kinked, TPA reversal deemed to risky. Sheath pulled in PACU 7/11- S/P complete revascularization of occluded Lt MCA M1 seg with x 1 pass with Solitaire 35mm x92mm retrieval device achieving a TICI 3 reperfusion 7/12- CT abd revealed hematoma in the anterior RIGHT thigh measuring 5.6 x 5.3 cm  Patient is currently intubated on ventilator support MV: 13.9 L/min Temp (24hrs), Avg:98.1 F (36.7 C), Min:97.6 F (36.4 C), Max:98.7 F (37.1 C)  Propofol: stopped this AM  Per RN, pt concerned over increased size of hematoma; plan to obtain ultrasound. Per VVS, plan for emergency right groin exploration and repair of femoral artery is this patient only chance of survival. Pt currently in OR.   Unable to complete Nutrition-Focused physical exam at this time.   Wt hx reviewed. Wt has been stable over the past year.   Labs reviewed.   Diet Order:  Diet NPO time specified  Skin:  Reviewed, no issues  Last BM:  PTA  Height:   Ht Readings from Last 1 Encounters:   06/26/17 5\' 9"  (1.753 m)    Weight:   Wt Readings from Last 1 Encounters:  06/27/17 201 lb 8 oz (91.4 kg)    Ideal Body Weight:  72.7 kg  BMI:  Body mass index is 29.76 kg/m.  Estimated Nutritional Needs:   Kcal:  1960.5  Protein:  100-115 grams  Fluid:  1.9-2.1 L  EDUCATION NEEDS:   No education needs identified at this time  Tamon Parkerson A. Jimmye Norman, RD, LDN, CDE Pager: [phone] After hours Pager: [phone]

## 2017-06-27 NOTE — Progress Notes (Signed)
66 Dr. Estanislado Pandy notified of results of vascular ultrasound. Pressure being held to right groin site.   [place] Dr. Elsworth Soho notified of decrease in BP, patient receiving second unit of PRBCs, increasing titrations need for levophed, and 8 beat run of Vtach. Lab orders received.

## 2017-06-27 NOTE — Progress Notes (Deleted)
Brief History and Physical  History of Present Illness   Robert Keith is a 83 y.o. male who presents with chief complaint: rapidly expanding R groin hematoma.  Pt is intubated and unable to response to question.  Dr. Estanislado Pandy performed L MCA mechanical thrombectomy on 06/26/17.  Reported some difficulty with access was noted.  Pt was stable overnight and became unstable earlier today.  Non-invasives demonstrated a PSA in R groin.  Patient started decompensating with rapid growth of hematoma in right groin over the last few hours.  Vascular surgery asked to evaluate for surgical repair of R femoral artery.  Past Medical History:  Diagnosis Date  . Cancer [facility])    prostate  . Coronary artery disease   . Diabetes mellitus without complication ([place])   . Essential tremor   . H. pylori infection   . Hypertension   . Intracranial hemorrhage [facility])     Past Surgical History:  Procedure Laterality Date  . brain surgery for bleed    . cardiac bypass    . CORONARY ARTERY BYPASS GRAFT    . ESOPHAGOGASTRODUODENOSCOPY (EGD) WITH PROPOFOL N/A 09/18/2016   Procedure: ESOPHAGOGASTRODUODENOSCOPY (EGD) WITH PROPOFOL;  Surgeon: [name], MD;  Location: [place] ENDOSCOPY;  Service: Endoscopy;  Laterality: N/A;  . RADIOLOGY WITH ANESTHESIA N/A 06/26/2017   Procedure: RADIOLOGY WITH ANESTHESIA CODE STROKE;  Surgeon: [name], MD;  Location: [place];  Service: Radiology;  Laterality: N/A;    Social History   Social History  . Marital status: Married    Spouse name: N/A  . Number of children: N/A  . Years of education: N/A   Occupational History  . Not on file.   Social History Main Topics  . Smoking status: Former Research scientist (life sciences)  . Smokeless tobacco: Never Used  . Alcohol use No  . Drug use: No  . Sexual activity: Not on file   Other Topics Concern  . Not on file   Social History Narrative   As of 09/2016 the patient has been a widow for about 7 years. He has caretakers present at  his home around the clock. However he is independent to bathe and dress himself.    Family History  Problem Relation Age of Onset  . Stroke Mother   . Heart attack Father     Current Facility-Administered Medications  Medication Dose Route Frequency Provider Last Rate Last Dose  . 0.9 %  sodium chloride infusion   Intravenous Continuous Amie Portland, MD 75 mL/hr at 06/27/17 1400    . 0.9 %  sodium chloride infusion  250 mL Intravenous PRN Omar Person, NP      . 0.9 %  sodium chloride infusion   Intravenous Once Jennelle Human B, NP      . 0.9 %  sodium chloride infusion   Intravenous Once Erick Colace, NP      . 0.9 %  sodium chloride infusion   Intravenous Once Erick Colace, NP      . acetaminophen (TYLENOL) tablet 650 mg  650 mg Oral Q4H PRN Amie Portland, MD       Or  . acetaminophen (TYLENOL) solution 650 mg  650 mg Per Tube Q4H PRN Amie Portland, MD       Or  . acetaminophen (TYLENOL) suppository 650 mg  650 mg Rectal Q4H PRN Amie Portland, MD      . chlorhexidine gluconate (MEDLINE KIT) (PERIDEX) 0.12 % solution 15 mL  15 mL Mouth  Rinse BID Greta Doom, MD   15 mL at 06/27/17 1043  . fentaNYL (SUBLIMAZE) injection 50 mcg  50 mcg Intravenous Q2H PRN Omar Person, NP   50 mcg at 06/27/17 1417  . insulin aspart (novoLOG) injection 2-6 Units  2-6 Units Subcutaneous Q4H Omar Person, NP   4 Units at 06/27/17 1301  . MEDLINE mouth rinse  15 mL Mouth Rinse 10 times per day Greta Doom, MD   15 mL at 06/27/17 1400  . norepinephrine (LEVOPHED) 4 mg in dextrose 5 % 250 mL (0.016 mg/mL) infusion  0-40 mcg/min Intravenous Titrated Kara Mead V, MD 150 mL/hr at 06/27/17 1440 40 mcg/min at 06/27/17 1440  . ondansetron (ZOFRAN) injection 4 mg  4 mg Intravenous Q6H PRN Deveshwar, Sanjeev, MD      . pantoprazole (PROTONIX) injection 40 mg  40 mg Intravenous QHS Omar Person, NP   40 mg at 06/26/17 2116  . propofol (DIPRIVAN) 1000 MG/100ML  infusion  0-50 mcg/kg/min Intravenous Continuous Omar Person, NP   Stopped at 06/27/17 1100  . senna-docusate (Senokot-S) tablet 1 tablet  1 tablet Oral QHS PRN Amie Portland, MD      . sodium bicarbonate 1 mEq/mL injection           . sodium bicarbonate injection 50 mEq  50 mEq Intravenous STAT Erick Colace, NP      . vasopressin (PITRESSIN) 40 Units in sodium chloride 0.9 % 250 mL (0.16 Units/mL) infusion  0.03 Units/min Intravenous Continuous Erick Colace, NP        Allergies  Allergen Reactions  . Metformin And Related Diarrhea  . Tape Other (See Comments)    PLEASE USE COBAN WRAP; PATIENT'S SKIN IS THIN AND WILL TEAR EASILY!!  . Bupropion Other (See Comments)    Unknown  . Fenofibrate Micronized Other (See Comments)    Unknown  . Gemfibrozil Other (See Comments)    Unknown  . Niacin Other (See Comments)    Impotence   . Phenobarbital Other (See Comments)    Confusion   . Simvastatin Other (See Comments)    Myalgia    Review of Systems: can not be obtained due to due to AMS   Physical Examination   Vitals:   06/27/17 1430 06/27/17 1450 06/27/17 1500 06/27/17 1508  BP: (!) 73/61 (!) 77/48 91/66 (!) 79/51  Pulse: (!) 36 100 (!) 46 (!) 102  Resp: (!) 23 19 (!) 25 (!) 24  Temp:  97.8 F (36.6 C)    TempSrc:  Axillary    SpO2: 100% 100% 100% 100%  Weight:      Height:       Body mass index is 29.76 kg/m.  General minimal responsive, intubated,   Pulmonary Sym exp, mech vent sounds, intubated, B rales  Cardiac RRR, Nl S1, S2, no Murmurs, No rubs, No S3,S4, Neo drip running  Musculo- skeletal R groin with obvious hematoma with tense skin  Neurologic No exam possible    Laboratory   CBC CBC Latest Ref Rng & Units 06/27/2017 06/27/2017 06/27/2017  WBC 4.0 - 10.5 K/uL 14.8(H) - -  Hemoglobin 13.0 - 17.0 g/dL 8.7(L) 8.2(L) 9.1(L)  Hematocrit 39.0 - 52.0 % 26.6(L) 25.6(L) 28.1(L)  Platelets 150 - 400 K/uL 130(L) - -    BMP BMP Latest Ref Rng &  Units 06/27/2017 06/27/2017 06/26/2017  Glucose 65 - 99 mg/dL 312(H) 165(H) 133(H)  BUN 6 - 20 mg/dL 17 16 23(H)  Creatinine 0.61 - 1.24 mg/dL 1.33(H) 0.89 0.90  Sodium 135 - 145 mmol/L 136 139 141  Potassium 3.5 - 5.1 mmol/L 4.6 3.9 4.4  Chloride 101 - 111 mmol/L 114(H) 114(H) 107  CO2 22 - 32 mmol/L 12(L) 20(L) -  Calcium 8.9 - 10.3 mg/dL 7.3(L) 7.4(L) -    Coagulation Lab Results  Component Value Date   INR 1.67 06/27/2017   INR 1.13 06/26/2017   INR 1.21 09/17/2016   No results found for: PTT  Lipids    Component Value Date/Time   CHOL 115 06/27/2017 0500   TRIG 86 06/27/2017 0500   HDL 20 (L) 06/27/2017 0500   CHOLHDL 5.8 06/27/2017 0500   VLDL 17 06/27/2017 0500   LDLCALC 78 06/27/2017 0500     Medical Decision Making   Robert Keith is a 83 y.o. male who presents with: s/p R CFA cannulation for R MCA thrombectomy complicated with hemorrhagic shock from cannulation.   I briefly discussed with the family that emergency right groin exploration and repair of femoral artery is this patient only chance of survival. The risk, benefits, and alternative for bypass operations were discussed with the patient.   The family is aware the risks include but are not limited to: bleeding, infection, myocardial infarction, stroke, limb loss, nerve damage, need for additional procedures in the future, and wound complications. The family is aware of the high risk of mortality in this case and give verbal consent to proceed.     Adele Barthel, MD, FACS [facility] Office: [phone] Pager: [phone]  06/27/2017, 4:03 PM

## 2017-06-27 NOTE — Progress Notes (Signed)
Dr. Estanislado Pandy, Dr. Leonie Man, Dr. Bridgett Larsson at bedside, patient transferred emergently to OR. Patient's family updated.

## 2017-06-27 NOTE — Progress Notes (Signed)
PT Cancellation Note  Patient Details Name: Robert Keith MRN: [id number] DOB: [birth year]   Cancelled Treatment:    Reason Eval/Treat Not Completed: Patient not medically ready.  Sedated/vented. 06/27/2017  [name], [place] [phone]  (pager)   [name] 06/27/2017, 10:11 AM

## 2017-06-27 NOTE — Progress Notes (Signed)
Pt. Was transported to CT this morning & back to 4N24. Pt. Was also transported to MRI & back to 4N24 as well. There were not any complications with either transport.

## 2017-06-27 NOTE — Progress Notes (Signed)
Referring Physician(s):  Dr. 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 Groin assessed- sent for CT this AM by primary service. Family at bedside.  Allergies: Metformin and related; Tape; Bupropion; Fenofibrate micronized; Gemfibrozil; Niacin; Phenobarbital; and Simvastatin  Medications: Prior to Admission medications   Medication Sig Start Date End Date Taking? Authorizing Provider  amLODipine (NORVASC) 5 MG tablet Take 2.5 mg by mouth 2 (two) times daily.  04/04/17  Yes [provider]  clotrimazole (LOTRIMIN) 1 % cream Apply 1 application topically 2 (two) times daily.  06/20/17  Yes [provider]  furosemide (LASIX) 20 MG tablet Take 1 tablet (20 mg total) by mouth daily as needed. Patient taking differently: Take 20 mg by mouth daily as needed for fluid or edema.  11/07/16 06/27/17 Yes Gollan, Kathlene November, MD  glipiZIDE (GLUCOTROL) 10 MG tablet Take 10 mg by mouth 2 (two) times daily. 05/24/17  Yes [provider]  KLOR-CON M20 20 MEQ tablet TAKE 1 TABLET (20 MEQ TOTAL) BY MOUTH DAILY AS NEEDED. Patient taking differently: Take 20 mEq by mouth daily as needed (for potassium).  06/11/17  Yes Minna Merritts, MD  metFORMIN (GLUCOPHAGE) 1000 MG tablet Take 1,000 mg by mouth 2 (two) times daily with a meal.   Yes [provider]  metoprolol (LOPRESSOR) 100 MG tablet Take 100 mg by mouth 2 (two) times daily.  07/16/16  Yes [provider]  pantoprazole (PROTONIX) 40 MG tablet Take 1 tablet (40 mg total) by mouth 2 (two) times daily before a meal. Patient taking differently: Take 40 mg by mouth daily.  09/21/16  Yes Ghimire, Henreitta Leber, MD  pioglitazone (ACTOS) 15 MG tablet Take 15 mg by mouth daily. 06/21/17  Yes [provider]  pravastatin (PRAVACHOL) 40 MG tablet Take 40  mg by mouth daily. 11/23/15  Yes [provider]  propranolol (INDERAL) 40 MG tablet Take 40 mg by mouth 2 (two) times daily. 06/13/17  Yes [provider]     Vital Signs: BP (!) 102/58   Pulse 95   Temp 98 F (36.7 C) (Axillary)   Resp 19   Ht 5\' 9"  (1.753 m)   Wt 201 lb 8 oz (91.4 kg)   SpO2 100%   BMI 29.76 kg/m   Physical Exam  Constitutional: He appears well-developed.  Nursing note and vitals reviewed. Intubated.  Sedated. Groin with hematoma.  Pressure dressing in place.  Imaging: Ct Abdomen Pelvis Wo Contrast  Result Date: 06/27/2017 CLINICAL DATA:  Low hemoglobin. Patient status post tPA administration EXAM: CT ABDOMEN AND PELVIS WITHOUT CONTRAST TECHNIQUE: Multidetector CT imaging of the abdomen and pelvis was performed following the standard protocol without IV contrast. COMPARISON:  CT abdomen 09/16/2016 FINDINGS: Lower chest: Bilateral small effusions. Hepatobiliary: No focal hepatic lesion. Gallbladder mildly distended to the 4 cm. Pancreas: Pancreas is normal. No ductal dilatation. No pancreatic inflammation. Spleen: Normal spleen Adrenals/urinary tract: Adrenal glands kidneys normal. Excreted IV contrast within the bladder. Foley catheter in bladder. Stomach/Bowel: Stomach, small bowel, appendix, and cecum are normal. Extensive diverticulosis of the sigmoid colon. Vascular/Lymphatic: Abdominal aorta is normal caliber with atherosclerotic calcification. There is no retroperitoneal or periportal lymphadenopathy. No pelvic lymphadenopathy. Reproductive: Post prostatectomy Other: No intraperitoneal free fluid. Musculoskeletal: No hematoma in the anterior RIGHT thigh measuring 5.6 x 5.3 cm in axial dimension (image 110, series  3). The full extent of the hematoma is not imaged and extends more inferiorly into the thigh. IMPRESSION: 1. Hematoma within the proximal RIGHT anterior thigh is incompletely imaged. Hematoma likely spontaneous related to anticoagulation.  Recommend clinical correlation with extent of hematoma in the RIGHT thigh. 2. No intraperitoneal or retroperitoneal hemorrhage. 3. Extensive diverticulosis without diverticulitis. 4.  Aortic Atherosclerosis (ICD10-I70.0). 5. Small bilateral pleural effusions. These results will be called to the ordering clinician or representative by the Radiologist Assistant, and communication documented in the PACS or zVision Dashboard. Electronically Signed   By: Suzy Bouchard M.D.   On: 06/27/2017 09:10   Ct Angio Head W Or Wo Contrast  Result Date: 06/26/2017 CLINICAL DATA:  Code stroke. Acute right-sided weakness, aphasia, and facial droop. EXAM: CT ANGIOGRAPHY HEAD AND NECK TECHNIQUE: Multidetector CT imaging of the head and neck was performed using the standard protocol during bolus administration of intravenous contrast. Multiplanar CT image reconstructions and MIPs were obtained to evaluate the vascular anatomy. Carotid stenosis measurements (when applicable) are obtained utilizing NASCET criteria, using the distal internal carotid diameter as the denominator. CONTRAST:  50 mL Isovue 370 COMPARISON:  CT head without contrast same day. FINDINGS: CTA NECK FINDINGS Aortic arch: Atherosclerotic calcifications are present along the undersurface of the aortic arch and at the great vessel origins without significant stenosis. Right carotid system: Right common carotid artery is tortuous. Dense atherosclerotic calcifications are present at the carotid bifurcation. Minimal luminal diameter is 4 mm. There is no significant stenosis relative to the more distal vessel. The cervical right ICA is tortuous with mild distal regularity just below the skullbase is no significant stenosis. Left carotid system: The left common carotid artery is within normal limits. Calcified and noncalcified plaque are present at the left carotid bifurcation without significant luminal stenosis. The minimal luminal diameter is 3.3 mm. The more distal  cervical left ICA is tortuous without stenosis. Vertebral arteries: The vertebral arteries originate from the subclavian arteries bilaterally. The right vertebral artery is the dominant vessel. The left vertebral artery is hypoplastic. There is no focal stenosis or occlusion of either vertebral artery in the neck. Skeleton: Multilevel endplate degenerative changes are present throughout the cervical spine. Osseous foraminal narrowing is secondary to uncovertebral spurring at C6-7 and combination of facet and uncovertebral spurring at C3-4. No focal lytic or blastic lesions are present. AP alignment is anatomic. Other neck: Soft tissues the neck are otherwise unremarkable. Salivary glands are within normal limits. No focal mucosal or submucosal lesions are present. The larynx is within normal limits. A 10 mm hypodense thyroid nodule is present on the right. Lymph node calcifications are present in the mediastinum. Upper chest: Emphysematous changes are present at the lung apices. A calcified granuloma in right upper lobe measures 5 mm. No other focal nodule, mass, or airspace disease is present. The upper mediastinum is otherwise within normal limits. Review of the MIP images confirms the above findings CTA HEAD FINDINGS Anterior circulation: Atherosclerotic calcifications are present within the cavernous internal carotid artery's bilaterally without significant stenosis through the ICA termini bilaterally. The A1 segments are normal. The left A1 segment dominant. Anterior communicating artery is patent. The right M1 segment is normal. The left M1 segment is occluded. Right MCA branch vessels are intact. The left MCA bifurcation is not opacified. There is distal reconstitution of anterior left MCA branches. Posterior circulation: The left vertebral artery terminates at PICA. The right vertebral artery the basilar artery. There is some communication from the PICA  to the vertebrobasilar junction. The basilar artery is  normal. Both posterior cerebral arteries originate from basilar tip. There is moderate narrowing of the proximal posterior cerebral arteries bilaterally, right greater than left. There is some attenuation of distal vessels as well. Venous sinuses: The dural sinuses are patent. The right transverse sinus is dominant. Straight sinus and deep cerebral veins are intact. Cortical veins are unremarkable. Anatomic variants: None Delayed phase: Mild formed Review of the MIP images confirms the above findings IMPRESSION: 1. Emergent large vessel occlusion of the left M1 segment. 2. There is some reconstitution of anterior left MCA branch vessels from pial collaterals. 3. Dense calcifications at the carotid bifurcations bilaterally without significant stenoses relative to the more distal vessels. 4. Moderate narrowing of the proximal posterior cerebral arteries bilaterally, right greater than left. 5. Calcifications at great vessel origins along the arch without significant stenosis. 6. Hypoplastic left vertebral artery. 7. Multilevel cervical spinal spondylosis. 8. Emphysema. 9. Prior granulomatous disease in the right upper lobe. These results were called by telephone at the time of interpretation on 06/26/2017 at 3:09 pm to Dr. Antony Contras, who verbally acknowledged these results. Electronically Signed   By: San Morelle M.D.   On: 06/26/2017 15:33   Ct Angio Neck W Or Wo Contrast  Result Date: 06/26/2017 CLINICAL DATA:  Code stroke. Acute right-sided weakness, aphasia, and facial droop. EXAM: CT ANGIOGRAPHY HEAD AND NECK TECHNIQUE: Multidetector CT imaging of the head and neck was performed using the standard protocol during bolus administration of intravenous contrast. Multiplanar CT image reconstructions and MIPs were obtained to evaluate the vascular anatomy. Carotid stenosis measurements (when applicable) are obtained utilizing NASCET criteria, using the distal internal carotid diameter as the denominator.  CONTRAST:  50 mL Isovue 370 COMPARISON:  CT head without contrast same day. FINDINGS: CTA NECK FINDINGS Aortic arch: Atherosclerotic calcifications are present along the undersurface of the aortic arch and at the great vessel origins without significant stenosis. Right carotid system: Right common carotid artery is tortuous. Dense atherosclerotic calcifications are present at the carotid bifurcation. Minimal luminal diameter is 4 mm. There is no significant stenosis relative to the more distal vessel. The cervical right ICA is tortuous with mild distal regularity just below the skullbase is no significant stenosis. Left carotid system: The left common carotid artery is within normal limits. Calcified and noncalcified plaque are present at the left carotid bifurcation without significant luminal stenosis. The minimal luminal diameter is 3.3 mm. The more distal cervical left ICA is tortuous without stenosis. Vertebral arteries: The vertebral arteries originate from the subclavian arteries bilaterally. The right vertebral artery is the dominant vessel. The left vertebral artery is hypoplastic. There is no focal stenosis or occlusion of either vertebral artery in the neck. Skeleton: Multilevel endplate degenerative changes are present throughout the cervical spine. Osseous foraminal narrowing is secondary to uncovertebral spurring at C6-7 and combination of facet and uncovertebral spurring at C3-4. No focal lytic or blastic lesions are present. AP alignment is anatomic. Other neck: Soft tissues the neck are otherwise unremarkable. Salivary glands are within normal limits. No focal mucosal or submucosal lesions are present. The larynx is within normal limits. A 10 mm hypodense thyroid nodule is present on the right. Lymph node calcifications are present in the mediastinum. Upper chest: Emphysematous changes are present at the lung apices. A calcified granuloma in right upper lobe measures 5 mm. No other focal nodule,  mass, or airspace disease is present. The upper mediastinum is otherwise within normal  limits. Review of the MIP images confirms the above findings CTA HEAD FINDINGS Anterior circulation: Atherosclerotic calcifications are present within the cavernous internal carotid artery's bilaterally without significant stenosis through the ICA termini bilaterally. The A1 segments are normal. The left A1 segment dominant. Anterior communicating artery is patent. The right M1 segment is normal. The left M1 segment is occluded. Right MCA branch vessels are intact. The left MCA bifurcation is not opacified. There is distal reconstitution of anterior left MCA branches. Posterior circulation: The left vertebral artery terminates at PICA. The right vertebral artery the basilar artery. There is some communication from the PICA to the vertebrobasilar junction. The basilar artery is normal. Both posterior cerebral arteries originate from basilar tip. There is moderate narrowing of the proximal posterior cerebral arteries bilaterally, right greater than left. There is some attenuation of distal vessels as well. Venous sinuses: The dural sinuses are patent. The right transverse sinus is dominant. Straight sinus and deep cerebral veins are intact. Cortical veins are unremarkable. Anatomic variants: None Delayed phase: Mild formed Review of the MIP images confirms the above findings IMPRESSION: 1. Emergent large vessel occlusion of the left M1 segment. 2. There is some reconstitution of anterior left MCA branch vessels from pial collaterals. 3. Dense calcifications at the carotid bifurcations bilaterally without significant stenoses relative to the more distal vessels. 4. Moderate narrowing of the proximal posterior cerebral arteries bilaterally, right greater than left. 5. Calcifications at great vessel origins along the arch without significant stenosis. 6. Hypoplastic left vertebral artery. 7. Multilevel cervical spinal spondylosis. 8.  Emphysema. 9. Prior granulomatous disease in the right upper lobe. These results were called by telephone at the time of interpretation on 06/26/2017 at 3:09 pm to Dr. Antony Contras, who verbally acknowledged these results. Electronically Signed   By: San Morelle M.D.   On: 06/26/2017 15:33   Ct Cerebral Perfusion W Contrast  Result Date: 06/26/2017 CLINICAL DATA:  Acute onset of right-sided weakness, aphasia, and facial droop. EXAM: CT PERFUSION BRAIN TECHNIQUE: Multiphase CT imaging of the brain was performed following IV bolus contrast injection. Subsequent parametric perfusion maps were calculated using RAPID software. CONTRAST:  At 40 mL Isovue-M 370 COMPARISON:  CT head without contrast from the same. FINDINGS: CT Brain Perfusion Findings: CBF (<30%) Volume: 71mL Perfusion (Tmax>6.0s) volume: 120mL Mismatch Volume: 177mL.  Mismatch ratio is 3.7 Infarction Location:Left temporal lobe and frontal operculum. Left MCA territory. IMPRESSION: Left M1 emergent large vessel occlusion with associated acute left MCA territory infarct. Core infarct is estimated at 49 mL. The tissue at risk is estimated at 182 mL. These results were called by telephone at the time of interpretation on 06/26/2017 at 3:09 pm to Dr. Antony Contras , who verbally acknowledged these results. Electronically Signed   By: San Morelle M.D.   On: 06/26/2017 15:16   Dg Chest Port 1 View  Result Date: 06/27/2017 CLINICAL DATA:  Intubated, acute respiratory failure EXAM: PORTABLE CHEST 1 VIEW COMPARISON:  Chest radiograph from one day prior. FINDINGS: Endotracheal tube tip is 2.4 cm above the carina. Intact sternotomy wires. CABG clips overlie the mediastinum. Stable cardiomediastinal silhouette with top-normal heart size. No pneumothorax. Probable stable trace left pleural effusion. No right pleural effusion. No pulmonary edema. Patchy opacity and volume loss at the left lung base, unchanged. Stable tiny granuloma in the mid to  upper right lung. IMPRESSION: 1. Well-positioned endotracheal tube. 2. Stable patchy opacity and volume loss at the left lung base, favor scarring or atelectasis. 3. Probable  stable trace left pleural effusion. Electronically Signed   By: Ilona Sorrel M.D.   On: 06/27/2017 07:46   Dg Chest Port 1 View  Result Date: 06/26/2017 CLINICAL DATA:  ET tube placement EXAM: PORTABLE CHEST 1 VIEW COMPARISON:  None. FINDINGS: Endotracheal tube is 3 cm above the carina. Prior CABG. Cardiomegaly. Left lower lobe atelectasis or infiltrate. Right lung is clear. No effusions. No acute bony abnormality. IMPRESSION: Endotracheal tube 3 cm above the carina. Left lower lobe atelectasis or infiltrate. Electronically Signed   By: Rolm Baptise M.D.   On: 06/26/2017 21:42   Ct Head Code Stroke W/o Cm  Result Date: 06/26/2017 CLINICAL DATA:  Code stroke. 83 y/o M; right-sided weakness, aphasia, facial droop. EXAM: CT HEAD WITHOUT CONTRAST TECHNIQUE: Contiguous axial images were obtained from the base of the skull through the vertex without intravenous contrast. COMPARISON:  11/29/2016 MRI of the head and 12/07/2013 CT head. FINDINGS: Brain: No evidence of acute infarction, new hemorrhage, hydrocephalus, new extra-axial collection or mass lesion/mass effect. Chronic left frontal lobe infarction. Stable left frontal subdural hematoma measuring up to 6 mm in thickness with mild mass effect on the underlying brain and attenuation slightly increased compared with CSF. No midline shift. Vascular: Density within the left distal M1 may represent thrombus (series 6, image 38). Skull: Postsurgical changes related to left frontal burr hole are stable. Sinuses/Orbits: Mild mucosal thickening in the right maxillary sinus. Otherwise negative. Other: Bilateral intra-ocular lens replacement. ASPECTS [facility] Stroke Program Early CT Score) - Ganglionic level infarction (caudate, lentiform nuclei, internal capsule, insula, M1-M3 cortex): 7 -  Supraganglionic infarction (M4-M6 cortex): 3 Total score (0-10 with 10 being normal): 10 IMPRESSION: 1. Focal hyperdensity in left distal M1 may represent thrombus. 2. No vascular territory infarct identified at this time. 3. Small left frontal subdural hematoma is stable in size and attenuation from 2014. 4. ASPECTS is 10 These results were called by telephone at the time of interpretation on 06/26/2017 at 2:40 pm to Dr. Leonie Man, who verbally acknowledged these results. Electronically Signed   By: Kristine Garbe M.D.   On: 06/26/2017 14:42    Labs:  CBC:  Recent Labs  09/20/16 0524 09/21/16 0426 06/26/17 1420 06/26/17 1444 06/26/17 1914 06/27/17 0314 06/27/17 0728  WBC 7.5 5.3 5.9  --   --  11.1*  --   HGB 9.2* 9.5* 12.3* 12.9* 10.0* 8.8* 9.1*  HCT 29.1* 30.6* 37.9* 38.0* 30.9* 26.9* 28.1*  PLT 231 241 195  --   --  153  --     COAGS:  Recent Labs  09/17/16 0325 06/26/17 1420  INR 1.21 1.13  APTT  --  29    BMP:  Recent Labs  09/19/16 0544 09/20/16 0524 06/26/17 1420 06/26/17 1444 06/27/17 0314  NA 139 139 137 141 139  K 4.1 3.9 4.4 4.4 3.9  CL 112* 103 110 107 114*  CO2 20* 23 21*  --  20*  GLUCOSE 128* 148* 135* 133* 165*  BUN <5* 5* 20 23* 16  CALCIUM 8.3* 8.4* 8.7*  --  7.4*  CREATININE 0.78 0.83 1.03 0.90 0.89  GFRNONAA >60 >60 >60  --  >60  GFRAA >60 >60 >60  --  >60    LIVER FUNCTION TESTS:  Recent Labs  09/16/16 1927 06/26/17 1420  BILITOT 0.5 0.9  AST 52* 34  ALT 46 33  ALKPHOS 89 88  PROT 7.3 6.7  ALBUMIN 4.2 3.6    Assessment and Plan: Middle cerebral artery  embolism s/p L common carotid arteriogram with complete revascularization of occluded L MCA.  Sheath was found to be kinked post-procedure with evidence of hematoma. Pressure dressing remains in place today.  CT pending.  Hgb stable.  Patient remains intubated/sedated. IR following along with Neuro service.   Electronically Signed: Docia Barrier,  PA 06/27/2017, 10:49 AM   I spent a total of 15 Minutes at the the patient's bedside AND on the patient's hospital floor or unit, greater than 50% of which was counseling/coordinating care for MCA stroke

## 2017-06-27 NOTE — Op Note (Addendum)
OPERATIVE NOTE   PROCEDURE: 1. Right groin exploration and evacuation of thigh hematoma 2. Repair of right profunda femoral artery  PRE-OPERATIVE DIAGNOSIS: hemorrhagic shock from right groin cannulation   POST-OPERATIVE DIAGNOSIS: same as above   SURGEON: Adele Barthel, MD  ASSISTANT(S): Gerri Lins, PAC   ANESTHESIA: general  ESTIMATED BLOOD LOSS: 1200 cc  FINDING(S): 1.  Large volume hematoma collected in proximal medial and lateral thigh 2.  Active bleeding from main trunk of profunda femoral artery from 2 punctures holes   SPECIMEN(S):  none  INDICATIONS:   Robert Keith is a 83 y.o. male who presents with hemorrhagic shock and rapidly expanding right thigh hematoma after right groin cannulation for a mechanical thrombectomy of right middle cerebral artery.  The patient was taken emergently to the operating room as he was actively hemorrhaging and hemodynamically unstable..  DESCRIPTION: No consent was obtained as this patient was brought back to the operating room emergently.  The patient was brought back to the operating room and placed supine upon the operating table.  The patient received IV antibiotics prior to induction.  A procedure time out was completed and the correct surgical site was verified.  After obtaining adequate anesthesia, the patient was prepped and draped in the standard fashion for: right groin exploration.  I identified the right common femoral artery under Sonosite guidance and marked this in the right groin.  I made an incision overlying the common femoral artery.  I dissected down to the common femoral artery with electrocautery.  I had access to the proximal common femoral artery up to the inguinal ligament.  I dissected out the proximal common femoral artery and placed a vessel loop.    I then started dissecting distally.  In the process, I entered into a large hematoma cavity which was pressurized.  There was active large volume bleeding at this  point.  I quickly evacuated 750 cc of clot from what turned out to be a hematoma cavity dissecting medially and laterally.  I packed these cavities with lap sponges to control any bleeding in these cavity.  This allowed me to identify active bleeding from what appeared to be the profunda artery area.  I clamped the right common femoral artery to slow down the bleeding.  I was able to dissect out circumflex branches and place them under tension.  I then was able to dissect out the proximal profunda femoral artery and control it with a vessel loop.  This was placed under tension.  After getting some pressure on the distal profunda femoral artery branches, I identified a couple of puncture holes in the main profunda femoral artery.  I repaired this with Z-stitches of 6-0 Prolene.  I released all vessel loops and the clamp on the common femoral artery.  There remained a good pulse in the right common femoral artery and profunda femoral artery.    I removed the sponges and washed out the cavities.  I repacked the cavities with sponges and packed Avitene in the common femoral artery and profunda femoral artery exposure.  After waiting a few minutes, I removed all Avitene and sponges, I did not see any active bleeding, but there was some diffuse ooze.  Due to the nature of this wound, I felt drain placement was going to be necessary as fluid and blood reaccumulation is expected.  I made a stab incision in the apex of the lateral cavity and passed a 19-Fr Blake drain through this incision.  Similarly,  I made a stab incision in the apex of the medial cavity and passed a 19-Fr Blake drain through this incision.  I anchored each drain in place with a 3-0 Nylon sutures tied to each drain.  I sharply shortened each drain so the tip laid medial and lateral to the proximal common femoral artery.    I reapproximated the deep tissue immediately superficial to the drains and arteries with a double layer of 2-0 Vicryl.  I then  reapproximated the superficial subcutaneous tissue with a running stitch of 3-0 Vicryl.  The skin was reapproximated with staples.  The Blake drains were connected to suction bulbs and each bulb was charged.  The incision and each drain exit site were sterilely dressed with bandages.     COMPLICATIONS: none  CONDITION: stable   Adele Barthel, MD, [facility] [place] Office: [phone] Pager: [phone]  06/27/2017, 5:47 PM

## 2017-06-27 NOTE — Progress Notes (Signed)
OT Cancellation Note  Patient Details Name: Robert Keith MRN: [id number] DOB: [birth year]   Cancelled Treatment:    Reason Eval/Treat Not Completed: Patient not medically ready (sedated. intubated)  [place], OT/L  [phone] 06/27/2017 06/27/2017, 7:51 AM

## 2017-06-27 NOTE — Progress Notes (Signed)
PCCM Interval Progress Note   S:  Called to bedside for progressive swelling of right groin site/thigh w/ progressive hypotension, now at levophed at 40mg /min in PIV, finishing 2nd of PRBC.  RN have held pressure at groin site PTA.  Distal pedal pulses dopplered.   O:  HR ST 120's, BP 79/49, 98% on MV   General:  Elderly male on MV HEENT: ETT/OGT Neuro: sedated CV: ST 120 PULM: even/non-labored on MV Extremities: cool/dry, obvious edema and ecchymosis of right groin and thigh extending beyond prior marked site, distal right pedal pulse by doppler   A/P:  Hemorraghic shock in the setting of right groin hematoma s/p sheath removal and TPA  - vascular ultrasound of right groin with limited arterial duplex around 1340 noted pseudoaneurym of right right groin - concern for compartment syndrome Hgb 8.7 at 1400 (taken during infusion of 2nd unit   P:  Neuro IR and vascular surgery contacted  Pressure applied to right groin 1 unit PRBC, FFP, and 10 units cryo and Platelets ordered and to stay ahead by 2 units Continue levophed, add vasopressin   Metabolic acidosis  - CO2 12 on recent BMET - likely due to progressive hemorraghic shock, worrisome for limb ischemia  P:  Stat ABG 1 amp HCO3 now  Goal Map > [place], AGACNP-BC [facility] Pgr: [phone] or if no answer [phone] 06/27/2017, 3:36 PM

## 2017-06-27 NOTE — Progress Notes (Signed)
BP continues to drop, Dr. Elsworth Soho notified. Levophed drip ordered. Right groin site, increasing in size and firm to touch, Dr. Estanislado Pandy paged. Will continue to monitor.

## 2017-06-27 NOTE — Progress Notes (Signed)
CRITICAL VALUE ALERT  Critical Value:  Lactic acid - 5.1  Date & Time Notied:  06/27/2017 1930  Provider Notified: CCM via [place]  Orders Received/Actions taken: No orders received at this time. Will continue to monitor closely.  Guadalupe Maple, RN

## 2017-06-27 NOTE — Progress Notes (Signed)
Pt. Was emergently transported to the OR without any complications.

## 2017-06-27 NOTE — Transfer of Care (Signed)
Immediate Anesthesia Transfer of Care Note  Patient: Robert Keith  Procedure(s) Performed: Procedure(s): FEMORAL ARTERY EXPLORATION (Right)  Patient Location: NICU  Anesthesia Type:General  Level of Consciousness: sedated, unresponsive and Patient remains intubated per anesthesia plan  Airway & Oxygen Therapy: Patient placed on Ventilator (see vital sign flow sheet for setting)  Post-op Assessment: Report given to RN and Post -op Vital signs reviewed and stable  Post vital signs: Reviewed and stable  Last Vitals:  Vitals:   06/27/17 1555 06/27/17 1605  BP: (!) 156/101 137/67  Pulse:    Resp: (!) 33 (!) 29  Temp:      Last Pain:  Vitals:   06/27/17 1450  TempSrc: Axillary         Complications: No apparent anesthesia complications

## 2017-06-27 NOTE — Progress Notes (Signed)
Dr. Leonie Man notified of H&H results and decrease in BP, orders received to transfuse 2 units of PRBCs and give 500cc NS bolus. Will continue to monitor.

## 2017-06-27 NOTE — Progress Notes (Signed)
*  PRELIMINARY RESULTS* Vascular Ultrasound Right groin limited arterial duplex has been completed.  Preliminary findings:  Pseudoaneurysm noted in the right groin measuring 3.52 cm with surrounding hematoma and the pseudo neck measuring 0.6 cm.  Results given to RN.  Landry Mellow, RDMS, RVT  06/27/2017, 1:41 PM

## 2017-06-27 NOTE — Progress Notes (Signed)
Critical Care NP at bedside, updated on BP issues and right femoral hematoma.

## 2017-06-27 NOTE — Consult Note (Signed)
Brief History and Physical  History of Present Illness   KY RUMPLE is a 83 y.o. male who presents with chief complaint: rapidly expanding R groin hematoma.  Pt is intubated and unable to response to question.  Dr. Estanislado Pandy performed L MCA mechanical thrombectomy on 06/26/17.  Reported some difficulty with access was noted.  Pt was stable overnight and became unstable earlier today.  Non-invasives demonstrated a PSA in R groin.  Patient started decompensating with rapid growth of hematoma in right groin over the last few hours.  Vascular surgery asked to evaluate for surgical repair of R femoral artery.      Past Medical History:  Diagnosis Date  . Cancer [facility])    prostate  . Coronary artery disease   . Diabetes mellitus without complication ([place])   . Essential tremor   . H. pylori infection   . Hypertension   . Intracranial hemorrhage [facility])          Past Surgical History:  Procedure Laterality Date  . brain surgery for bleed    . cardiac bypass    . CORONARY ARTERY BYPASS GRAFT    . ESOPHAGOGASTRODUODENOSCOPY (EGD) WITH PROPOFOL N/A 09/18/2016   Procedure: ESOPHAGOGASTRODUODENOSCOPY (EGD) WITH PROPOFOL;  Surgeon: [name], MD;  Location: [facility];  Service: Endoscopy;  Laterality: N/A;  . RADIOLOGY WITH ANESTHESIA N/A 06/26/2017   Procedure: RADIOLOGY WITH ANESTHESIA CODE STROKE;  Surgeon: [name], MD;  Location: [facility];  Service: Radiology;  Laterality: N/A;    Social History        Social History  . Marital status: Married    Spouse name: N/A  . Number of children: N/A  . Years of education: N/A      Occupational History  . Not on file.       Social History Main Topics  . Smoking status: Former Research scientist (life sciences)  . Smokeless tobacco: Never Used  . Alcohol use No  . Drug use: No  . Sexual activity: Not on file       Other Topics Concern  . Not on file      Social History Narrative   As of 09/2016 the  patient has been a widow for about 7 years. He has caretakers present at his home around the clock. However he is independent to bathe and dress himself.         Family History  Problem Relation Age of Onset  . Stroke Mother   . Heart attack Father              Current Facility-Administered Medications  Medication Dose Route Frequency Provider Last Rate Last Dose  . 0.9 %  sodium chloride infusion   Intravenous Continuous Amie Portland, MD 75 mL/hr at 06/27/17 1400    . 0.9 %  sodium chloride infusion  250 mL Intravenous PRN Omar Person, NP      . 0.9 %  sodium chloride infusion   Intravenous Once Jennelle Human B, NP      . 0.9 %  sodium chloride infusion   Intravenous Once Erick Colace, NP      . 0.9 %  sodium chloride infusion   Intravenous Once Erick Colace, NP      . acetaminophen (TYLENOL) tablet 650 mg  650 mg Oral Q4H PRN Amie Portland, MD       Or  . acetaminophen (TYLENOL) solution 650 mg  650 mg Per Tube Q4H PRN Amie Portland, MD  Or  . acetaminophen (TYLENOL) suppository 650 mg  650 mg Rectal Q4H PRN Amie Portland, MD      . chlorhexidine gluconate (MEDLINE KIT) (PERIDEX) 0.12 % solution 15 mL  15 mL Mouth Rinse BID Greta Doom, MD   15 mL at 06/27/17 1043  . fentaNYL (SUBLIMAZE) injection 50 mcg  50 mcg Intravenous Q2H PRN Omar Person, NP   50 mcg at 06/27/17 1417  . insulin aspart (novoLOG) injection 2-6 Units  2-6 Units Subcutaneous Q4H Omar Person, NP   4 Units at 06/27/17 1301  . MEDLINE mouth rinse  15 mL Mouth Rinse 10 times per day Greta Doom, MD   15 mL at 06/27/17 1400  . norepinephrine (LEVOPHED) 4 mg in dextrose 5 % 250 mL (0.016 mg/mL) infusion  0-40 mcg/min Intravenous Titrated Kara Mead V, MD 150 mL/hr at 06/27/17 1440 40 mcg/min at 06/27/17 1440  . ondansetron (ZOFRAN) injection 4 mg  4 mg Intravenous Q6H PRN Deveshwar, Sanjeev, MD      . pantoprazole (PROTONIX) injection 40 mg  40 mg  Intravenous QHS Omar Person, NP   40 mg at 06/26/17 2116  . propofol (DIPRIVAN) 1000 MG/100ML infusion  0-50 mcg/kg/min Intravenous Continuous Omar Person, NP   Stopped at 06/27/17 1100  . senna-docusate (Senokot-S) tablet 1 tablet  1 tablet Oral QHS PRN Amie Portland, MD      . sodium bicarbonate 1 mEq/mL injection           . sodium bicarbonate injection 50 mEq  50 mEq Intravenous STAT Erick Colace, NP      . vasopressin (PITRESSIN) 40 Units in sodium chloride 0.9 % 250 mL (0.16 Units/mL) infusion  0.03 Units/min Intravenous Continuous Erick Colace, NP             Allergies  Allergen Reactions  . Metformin And Related Diarrhea  . Tape Other (See Comments)    PLEASE USE COBAN WRAP; PATIENT'S SKIN IS THIN AND WILL TEAR EASILY!!  . Bupropion Other (See Comments)    Unknown  . Fenofibrate Micronized Other (See Comments)    Unknown  . Gemfibrozil Other (See Comments)    Unknown  . Niacin Other (See Comments)    Impotence   . Phenobarbital Other (See Comments)    Confusion   . Simvastatin Other (See Comments)    Myalgia    Review of Systems: can not be obtained due to due to AMS   Physical Examination         Vitals:   06/27/17 1430 06/27/17 1450 06/27/17 1500 06/27/17 1508  BP: (!) 73/61 (!) 77/48 91/66 (!) 79/51  Pulse: (!) 36 100 (!) 46 (!) 102  Resp: (!) 23 19 (!) 25 (!) 24  Temp:  97.8 F (36.6 C)    TempSrc:  Axillary    SpO2: 100% 100% 100% 100%  Weight:      Height:       Body mass index is 29.76 kg/m.  General minimal responsive, intubated,   Pulmonary Sym exp, mech vent sounds, intubated, B rales  Cardiac RRR, Nl S1, S2, no Murmurs, No rubs, No S3,S4, Neo drip running  Musculo- skeletal R groin with obvious hematoma with tense skin  Neurologic No exam possible    Laboratory   CBC CBC Latest Ref Rng & Units 06/27/2017 06/27/2017 06/27/2017  WBC 4.0 - 10.5 K/uL 14.8(H) - -  Hemoglobin  13.0 - 17.0 g/dL 8.7(L) 8.2(L) 9.1(L)  Hematocrit  39.0 - 52.0 % 26.6(L) 25.6(L) 28.1(L)  Platelets 150 - 400 K/uL 130(L) - -    BMP BMP Latest Ref Rng & Units 06/27/2017 06/27/2017 06/26/2017  Glucose 65 - 99 mg/dL 312(H) 165(H) 133(H)  BUN 6 - 20 mg/dL 17 16 23(H)  Creatinine 0.61 - 1.24 mg/dL 1.33(H) 0.89 0.90  Sodium 135 - 145 mmol/L 136 139 141  Potassium 3.5 - 5.1 mmol/L 4.6 3.9 4.4  Chloride 101 - 111 mmol/L 114(H) 114(H) 107  CO2 22 - 32 mmol/L 12(L) 20(L) -  Calcium 8.9 - 10.3 mg/dL 7.3(L) 7.4(L) -    Coagulation Recent Labs       Lab Results  Component Value Date   INR 1.67 06/27/2017   INR 1.13 06/26/2017   INR 1.21 09/17/2016     Recent Labs  No results found for: PTT    Lipids Labs (Brief)          Component Value Date/Time   CHOL 115 06/27/2017 0500   TRIG 86 06/27/2017 0500   HDL 20 (L) 06/27/2017 0500   CHOLHDL 5.8 06/27/2017 0500   VLDL 17 06/27/2017 0500   LDLCALC 78 06/27/2017 0500       Medical Decision Making   TYVON EGGENBERGER is a 83 y.o. male who presents with: s/p R CFA cannulation for R MCA thrombectomy complicated with hemorrhagic shock from cannulation.   I briefly discussed with the family that emergency right groin exploration and repair of femoral artery is this patient only chance of survival.  The risk, benefits, and alternative for bypass operations were discussed with the patient.    The family is aware the risks include but are not limited to: bleeding, infection, myocardial infarction, stroke, limb loss, nerve damage, need for additional procedures in the future, and wound complications.  The family is aware of the high risk of mortality in this case and give verbal consent to proceed.     Adele Barthel, MD, FACS [facility] Office: [phone] Pager: [phone]  06/27/2017, 4:03 PM

## 2017-06-27 NOTE — Progress Notes (Signed)
PULMONARY / CRITICAL CARE MEDICINE   Name: Robert Keith MRN: [id number] DOB: 1933-12-03    ADMISSION DATE:  06/26/2017 CONSULTATION DATE:  06/26/17  REFERRING MD:  Dr. Leonie Man  CHIEF COMPLAINT:  CVA  HISTORY OF PRESENT ILLNESS:   83 year old man  presented 7/11 from home with witnessed onset of right facial droop, right hemiplegia and aphasia at 1330.   CT head showed a hyperdense left middle cerebral artery and chronic small left frontal subdural (unchanged from prior in 2014).  TPA was given   CTA showed left M1 occlusion and therefore patient taken to neuro IR for mechanical thrombectomy and revascularization.  Patient intubated for procedure and therefore PCCM consulted for vent management.   past medical history of afib (not on NOAC 2/2 GI bleed), HTN, prior SDH in 2014 that required evacuation with residual small chronic SDH), DM, CAD, tremor, H. Pylori infection, and prostate cancer         STUDIES:  7/11 Head CT >> focal hyperdensity of left distal M1 may represent thrombus, no vascular territory infarct indentified, small left frontal SDH stable in size and attenuation from 2014, ASPECTS is 10 7/11 CTA head and neck>> emergent large vessel occlusion of left M1 segment, some reconstitution of anterior left MCA branch vessels from pial collaterals, dense calcifications at carotid bifurcation bilaterally without significant stenoses relative to more distal vessels, moderate narrowing of proximal posterior cerebral arteries bilaterally, right greater than left, calcification of great vessel origins along the arch without significant stenosis, hypoplastic left vertebral artery, multilevel cervical spinal spondylosis, emphysema, prior granulomatous disease of right upper lobe  7/12 CT abd >> hematoma in the anterior RIGHT thigh measuring 5.6 x 5.3 cm  CULTURES: none  ANTIBIOTICS: 7/11 Ancef (pre-op)  SIGNIFICANT EVENTS: 7/11  Admit  7/11 right femoral sheath found to be kinked, TPA  reversal deemed to risky. Sheath pulled in PACU.   LINES/TUBES: 7/11 ETT >> 7/11 OGT>> 7/11 Left Radial Aline >> 7/11 Right Femoral Sheath > 7/11    SUBJECTIVE:  Remains ntubated ,on low dose propofol gtt.  Afebrile Family reports hard of hearing  VITAL SIGNS: BP (!) 102/48 Comment: see art line  Pulse 74   Temp 97.8 F (36.6 C) (Axillary)   Resp 15   Ht 5\' 9"  (1.753 m)   Wt 201 lb 8 oz (91.4 kg)   SpO2 100%   BMI 29.76 kg/m   HEMODYNAMICS:    VENTILATOR SETTINGS: Vent Mode: PRVC FiO2 (%):  [40 %-100 %] 40 % Set Rate:  [14 bmp] 14 bmp Vt Set:  [570 mL] 570 mL PEEP:  [5 cmH20] 5 cmH20 Plateau Pressure:  [10 cmH20-15 cmH20] 15 cmH20  INTAKE / OUTPUT: I/O last 3 completed shifts: In: 1800.1 [I.V.:1800.1] Out: 1425 [Urine:1400; Blood:25]  PHYSICAL EXAMINATION: General: Elderly male,acutely ill HEENT: MM pink/moist, ETT, OGT,  Neuro: Sedated on propofol gtt, weaker on right side , moves toes on command CV: s1s2 rrr, no m/r/g PULM: even/non-labored, lungs clear bilaterally PO:EUMP, non-tender, bsx4 active  Extremities: warm/dry, no edema  Skin: no rashes or lesions   LABS:  BMET  Recent Labs Lab 06/26/17 1420 06/26/17 1444 06/27/17 0314  NA 137 141 139  K 4.4 4.4 3.9  CL 110 107 114*  CO2 21*  --  20*  BUN 20 23* 16  CREATININE 1.03 0.90 0.89  GLUCOSE 135* 133* 165*    Electrolytes  Recent Labs Lab 06/26/17 1420 06/27/17 0314  CALCIUM 8.7* 7.4*  MG  --  1.7  PHOS  --  3.2    CBC  Recent Labs Lab 06/26/17 1420  06/26/17 1914 06/27/17 0314 06/27/17 0728  WBC 5.9  --   --  11.1*  --   HGB 12.3*  < > 10.0* 8.8* 9.1*  HCT 37.9*  < > 30.9* 26.9* 28.1*  PLT 195  --   --  153  --   < > = values in this interval not displayed.  Coag's  Recent Labs Lab 06/26/17 1420  APTT 29  INR 1.13    Sepsis Markers No results for input(s): LATICACIDVEN, PROCALCITON, O2SATVEN in the last 168 hours.  ABG  Recent Labs Lab 06/26/17 2130   PHART 7.410  PCO2ART 37.1  PO2ART 251.0*    Liver Enzymes  Recent Labs Lab 06/26/17 1420  AST 34  ALT 33  ALKPHOS 88  BILITOT 0.9  ALBUMIN 3.6    Cardiac Enzymes No results for input(s): TROPONINI, PROBNP in the last 168 hours.  Glucose  Recent Labs Lab 06/26/17 1531 06/26/17 2106 06/26/17 2347 06/27/17 0342 06/27/17 0854  GLUCAP 133* 218* 189* 175* 136*    CXR - ETT in position, reviewed    DISCUSSION: 75 yoM with PMH of Afib- not on coag  With Left MCA-CVA s/p neuro IR  ASSESSMENT / PLAN:  PULMONARY A: Need for airway protection- post IR procedure  P:   Proceed with SBTs with goal extubation  CARDIOVASCULAR A:  Afib - not on anticoagulation 2/2 previous GI bleed Hx CAD, HTN  Echo 09/2016 >> RVSP 50, mild AS P:  SBP goal 120-140   RENAL A:   No acute issues  P:   Trend BMP / urinary output Replace electrolytes as indicated Avoid nephrotoxic agents, ensure adequate renal perfusion  GASTROINTESTINAL A:   Nutrition Needs  P:   NPO PPI for SUP  Consider TF in am if patient does not meet extubation criteria   HEMATOLOGIC A:   Anemia- mild , chronic + acute blood loss Rt thigh hematoma P:  Trend CBC SCDs     ENDOCRINE A:   DM   P:   CBG q 4  SSI  NEUROLOGIC A:   Left M1 occlusion s/p TPA and mechanical thrombectomy w/revascularization  - with right hemiplegia, global aphasia and left gaze   Chronic small left frontal SDH from prior SDH in 2014 that required  Hx prior SDH in 2014 requiring craniotomy  P:   RASS goal: 0 Propofol gtt  Fentanyl PRN Management per Neuro   FAMILY  - Updates: daughters + son at bedside , they agree with one way extubation once optimised  - Inter-disciplinary family meet or Palliative Care meeting due by:  7/18  My CC Time: 41 minutes    Kara Mead MD. [facility]. [place] Pager [phone] If no response call [phone]   06/27/2017

## 2017-06-27 NOTE — Progress Notes (Signed)
SLP Cancellation Note  Patient Details Name: Robert Keith MRN: [id number] DOB: October 05, 1933   Cancelled treatment:       Reason Eval/Treat Not Completed: Patient not medically ready, remains intubated this morning. Will f/u as able.   Germain Osgood 06/27/2017, 9:44 AM  Germain Osgood, M.A. CCC-SLP [phone]

## 2017-06-27 NOTE — Progress Notes (Signed)
Dr. Leonie Man notified of H&H results, orders received to hold transfusion of 2 units PRBCS per MD.

## 2017-06-27 NOTE — Progress Notes (Signed)
Patient ID: Robert Keith, male   DOB: 1933-02-24, 83 y.o.   MRN: [id number] INR asked to see patient because of worsening RT groin hematoma and hemodynamic instability. On arrival patient noted with a BP of 60s to 70s and a HR into the 120s.O2 sats > 95 % on vent. Rt groin significantly  enlarged with stretched shin and x2 slighty bleeding skin breaks. Manual pressure was applied. Distal pulses weakly dopplerable in the Rt foot. Following aggressive  resuscitation with x 4 units of blood,plasma ,cryopreicpittae and platelets and vasopressors . BP improved to 120s 130s /70 to 80s. Stat Vascular consult obtained . Patient seen by vascular surgery and taken to the OR. For exploration and evacuation. Distal Rt PT  Pulse dopplerable.. An US of the rt groin an hour earlier had revealed  An approx 2 to 3  cm pseudoaneurysm  with a narrow neck. S.Jasaun Carn MD

## 2017-06-28 ENCOUNTER — Inpatient Hospital Stay (HOSPITAL_COMMUNITY): Payer: Medicare Other

## 2017-06-28 ENCOUNTER — Other Ambulatory Visit: Payer: Self-pay | Admitting: Interventional Radiology

## 2017-06-28 ENCOUNTER — Encounter (HOSPITAL_COMMUNITY): Payer: Self-pay | Admitting: Vascular Surgery

## 2017-06-28 DIAGNOSIS — N179 Acute kidney failure, unspecified: Secondary | ICD-10-CM

## 2017-06-28 DIAGNOSIS — I360 Nonrheumatic tricuspid (valve) stenosis: Secondary | ICD-10-CM

## 2017-06-28 DIAGNOSIS — R578 Other shock: Secondary | ICD-10-CM

## 2017-06-28 LAB — BPAM FFP
BLOOD PRODUCT EXPIRATION DATE: 201807172359
BLOOD PRODUCT EXPIRATION DATE: 201807172359
BLOOD PRODUCT EXPIRATION DATE: 201807172359
Blood Product Expiration Date: 201807172359
Blood Product Expiration Date: 201807172359
Blood Product Expiration Date: 201807172359
Blood Product Expiration Date: 201807172359
Blood Product Expiration Date: 201807172359
ISSUE DATE / TIME: 201807122053
ISSUE DATE / TIME: 201807122053
ISSUE DATE / TIME: 201807122053
ISSUE DATE / TIME: 201807122053
ISSUE DATE / TIME: 201807121508
ISSUE DATE / TIME: 201807121629
ISSUE DATE / TIME: 201807121508
ISSUE DATE / TIME: 201807121629
UNIT TYPE AND RH: 6200
UNIT TYPE AND RH: 6200
UNIT TYPE AND RH: 6200
UNIT TYPE AND RH: 6200
Unit Type and Rh: 6200
Unit Type and Rh: 6200
Unit Type and Rh: 6200
Unit Type and Rh: 6200

## 2017-06-28 LAB — ECHOCARDIOGRAM COMPLETE
AV Area VTI index: 0.77 cm2/m2
AV Mean grad: 19 mmHg
AV Peak grad: 36 mmHg
AV peak Index: 0.7
AV pk vel: 300 cm/s
AV vel: 1.64
AVAREAMEANV: 1.39 cm2
AVAREAMEANVIN: 0.65 cm2/m2
AVAREAVTI: 1.49 cm2
AVCELMEANRAT: 0.36
Ao pk vel: 0.39 m/s
Ao-asc: 35 cm
CHL CUP DOP CALC LVOT VTI: 22.2 cm
CHL CUP MV DEC (S): 225
DOP CAL AO MEAN VELOCITY: 200 cm/s
EERAT: 9.79
EWDT: 225 ms
FS: 28 % (ref 28–44)
Height: 69 in
IV/PV OW: 1.25
LA ID, A-P, ES: 41 mm
LA diam index: 1.93 cm/m2
LA vol A4C: 83.1 ml
LA vol index: 45.1 mL/m2
LAVOL: 95.6 mL
LDCA: 3.8 cm2
LEFT ATRIUM END SYS DIAM: 41 mm
LV E/e'average: 9.79
LV PW d: 12.5 mm — AB (ref 0.6–1.1)
LV TDI E'LATERAL: 9.32
LV TDI E'MEDIAL: 6.57
LV e' LATERAL: 9.32 cm/s
LVEEMED: 9.79
LVOT SV: 84 mL
LVOT peak VTI: 0.43 cm
LVOT peak grad rest: 6 mmHg
LVOT peak vel: 118 cm/s
LVOTD: 22 mm
Lateral S' vel: 10.5 cm/s
MV pk A vel: 36.9 m/s
MVAP: 3.33 cm2
MVPG: 3 mmHg
MVPKEVEL: 91.2 m/s
P 1/2 time: 66 ms
P 1/2 time: 723 ms
Reg peak vel: 220 cm/s
TAPSE: 11.3 mm
TR max vel: 220 cm/s
VTI: 51.3 cm
Valve area index: 0.77
Valve area: 1.64 cm2
Weight: 3185.21 oz

## 2017-06-28 LAB — BPAM PLATELET PHERESIS
Blood Product Expiration Date: 201807142359
ISSUE DATE / TIME: 201807121533
Unit Type and Rh: 6200

## 2017-06-28 LAB — CBC
HEMATOCRIT: 28.2 % — AB (ref 39.0–52.0)
HEMATOCRIT: 23.3 % — AB (ref 39.0–52.0)
Hemoglobin: 9.9 g/dL — ABNORMAL LOW (ref 13.0–17.0)
Hemoglobin: 8.2 g/dL — ABNORMAL LOW (ref 13.0–17.0)
MCH: 29.2 pg (ref 26.0–34.0)
MCH: 29.2 pg (ref 26.0–34.0)
MCHC: 35.1 g/dL (ref 30.0–36.0)
MCHC: 35.2 g/dL (ref 30.0–36.0)
MCV: 83.2 fL (ref 78.0–100.0)
MCV: 82.9 fL (ref 78.0–100.0)
PLATELETS: 85 10*3/uL — AB (ref 150–400)
Platelets: 76 10*3/uL — ABNORMAL LOW (ref 150–400)
RBC: 3.39 MIL/uL — ABNORMAL LOW (ref 4.22–5.81)
RBC: 2.81 MIL/uL — ABNORMAL LOW (ref 4.22–5.81)
RDW: 14.6 % (ref 11.5–15.5)
RDW: 15.4 % (ref 11.5–15.5)
WBC: 16.1 10*3/uL — AB (ref 4.0–10.5)
WBC: 11.1 10*3/uL — ABNORMAL HIGH (ref 4.0–10.5)

## 2017-06-28 LAB — PREPARE FRESH FROZEN PLASMA
UNIT DIVISION: 0
Unit division: 0
Unit division: 0
Unit division: 0
Unit division: 0
Unit division: 0
Unit division: 0
Unit division: 0

## 2017-06-28 LAB — BPAM CRYOPRECIPITATE
BLOOD PRODUCT EXPIRATION DATE: 201807122116
Blood Product Expiration Date: 201807122116
ISSUE DATE / TIME: 201807121533
ISSUE DATE / TIME: 201807121533
UNIT TYPE AND RH: 5100
Unit Type and Rh: 5100

## 2017-06-28 LAB — BASIC METABOLIC PANEL
Anion gap: 8 (ref 5–15)
BUN: 19 mg/dL (ref 6–20)
CO2: 21 mmol/L — AB (ref 22–32)
Calcium: 7.1 mg/dL — ABNORMAL LOW (ref 8.9–10.3)
Chloride: 111 mmol/L (ref 101–111)
Creatinine, Ser: 1.24 mg/dL (ref 0.61–1.24)
GFR calc Af Amer: >60 mL/min (ref >60)
GFR, EST NON AFRICAN AMERICAN: 52 mL/min — AB (ref >60)
GLUCOSE: 185 mg/dL — AB (ref 65–99)
POTASSIUM: 3.4 mmol/L — AB (ref 3.5–5.1)
Sodium: 140 mmol/L (ref 135–145)

## 2017-06-28 LAB — PREPARE CRYOPRECIPITATE
Unit division: 0
Unit division: 0

## 2017-06-28 LAB — PREPARE PLATELET PHERESIS: Unit division: 0

## 2017-06-28 LAB — HEMOGLOBIN A1C
Hgb A1c MFr Bld: 6.8 % — ABNORMAL HIGH (ref 4.8–5.6)
Mean Plasma Glucose: 148 mg/dL

## 2017-06-28 LAB — GLUCOSE, CAPILLARY
GLUCOSE-CAPILLARY: 228 mg/dL — AB (ref 65–99)
GLUCOSE-CAPILLARY: 144 mg/dL — AB (ref 65–99)
GLUCOSE-CAPILLARY: 158 mg/dL — AB (ref 65–99)
Glucose-Capillary: 201 mg/dL — ABNORMAL HIGH (ref 65–99)
Glucose-Capillary: 132 mg/dL — ABNORMAL HIGH (ref 65–99)
Glucose-Capillary: 177 mg/dL — ABNORMAL HIGH (ref 65–99)

## 2017-06-28 LAB — HEMOGLOBIN AND HEMATOCRIT, BLOOD
HCT: 27.6 % — ABNORMAL LOW (ref 39.0–52.0)
HCT: 26.2 % — ABNORMAL LOW (ref 39.0–52.0)
HCT: 25.1 % — ABNORMAL LOW (ref 39.0–52.0)
Hemoglobin: 9.8 g/dL — ABNORMAL LOW (ref 13.0–17.0)
Hemoglobin: 9.2 g/dL — ABNORMAL LOW (ref 13.0–17.0)
Hemoglobin: 9 g/dL — ABNORMAL LOW (ref 13.0–17.0)

## 2017-06-28 MED ORDER — PANTOPRAZOLE SODIUM 40 MG PO PACK
40.0000 mg | PACK | Freq: Every day | ORAL | Status: DC
  Administered 2017-06-28 – 2017-07-03 (×6): 40 mg
  Filled 2017-06-28 (×6): qty 20

## 2017-06-28 MED ORDER — SODIUM CHLORIDE 0.9 % IV BOLUS (SEPSIS)
500.0000 mL | Freq: Once | INTRAVENOUS | Status: AC
  Administered 2017-06-28: 500 mL via INTRAVENOUS

## 2017-06-28 MED ORDER — SODIUM CHLORIDE 0.9 % IV SOLN
1.0000 g | Freq: Once | INTRAVENOUS | Status: AC
  Administered 2017-06-28: 1 g via INTRAVENOUS
  Filled 2017-06-28: qty 10

## 2017-06-28 MED ORDER — METOPROLOL TARTRATE 5 MG/5ML IV SOLN
2.5000 mg | INTRAVENOUS | Status: DC | PRN
  Administered 2017-06-28: 5 mg via INTRAVENOUS
  Administered 2017-06-28: 2.5 mg via INTRAVENOUS
  Administered 2017-06-29 – 2017-06-30 (×5): 5 mg via INTRAVENOUS
  Administered 2017-07-02 (×2): 2.5 mg via INTRAVENOUS
  Administered 2017-07-04: 5 mg via INTRAVENOUS
  Filled 2017-06-28 (×11): qty 5

## 2017-06-28 MED ORDER — FENTANYL CITRATE (PF) 100 MCG/2ML IJ SOLN
25.0000 ug | INTRAMUSCULAR | Status: DC | PRN
  Administered 2017-06-28 (×2): 50 ug via INTRAVENOUS
  Administered 2017-06-28: 25 ug via INTRAVENOUS
  Administered 2017-06-28 – 2017-06-30 (×13): 50 ug via INTRAVENOUS
  Administered 2017-07-01 (×3): 25 ug via INTRAVENOUS
  Administered 2017-07-01 – 2017-07-02 (×7): 50 ug via INTRAVENOUS
  Filled 2017-06-28 (×26): qty 2

## 2017-06-28 MED ORDER — PERFLUTREN LIPID MICROSPHERE
INTRAVENOUS | Status: AC
  Administered 2017-06-28: 4 mL
  Filled 2017-06-28: qty 10

## 2017-06-28 MED ORDER — VITAL HIGH PROTEIN PO LIQD
1000.0000 mL | ORAL | Status: DC
  Administered 2017-06-28 (×2)
  Administered 2017-06-28: 1000 mL
  Administered 2017-06-28 – 2017-06-29 (×7)
  Administered 2017-07-01 – 2017-07-02 (×2): 1000 mL
  Filled 2017-06-28 (×4): qty 1000

## 2017-06-28 MED ORDER — INSULIN ASPART 100 UNIT/ML ~~LOC~~ SOLN
0.0000 [IU] | SUBCUTANEOUS | Status: DC
  Administered 2017-06-28: 3 [IU] via SUBCUTANEOUS
  Administered 2017-06-28 (×2): 5 [IU] via SUBCUTANEOUS
  Administered 2017-06-28: 3 [IU] via SUBCUTANEOUS
  Administered 2017-06-28 (×2): 2 [IU] via SUBCUTANEOUS
  Administered 2017-06-29 – 2017-06-30 (×6): 3 [IU] via SUBCUTANEOUS
  Administered 2017-06-30: 5 [IU] via SUBCUTANEOUS
  Administered 2017-06-30: 2 [IU] via SUBCUTANEOUS
  Administered 2017-06-30: 3 [IU] via SUBCUTANEOUS
  Administered 2017-06-30: 5 [IU] via SUBCUTANEOUS
  Administered 2017-06-30 – 2017-07-01 (×6): 3 [IU] via SUBCUTANEOUS
  Administered 2017-07-01 (×2): 2 [IU] via SUBCUTANEOUS
  Administered 2017-07-02 (×4): 3 [IU] via SUBCUTANEOUS
  Administered 2017-07-02: 2 [IU] via SUBCUTANEOUS
  Administered 2017-07-02: 5 [IU] via SUBCUTANEOUS
  Administered 2017-07-03: 3 [IU] via SUBCUTANEOUS
  Administered 2017-07-03 (×2): 5 [IU] via SUBCUTANEOUS
  Administered 2017-07-03: 3 [IU] via SUBCUTANEOUS
  Administered 2017-07-03: 5 [IU] via SUBCUTANEOUS
  Administered 2017-07-04 (×4): 3 [IU] via SUBCUTANEOUS
  Administered 2017-07-04: 5 [IU] via SUBCUTANEOUS
  Administered 2017-07-04: 2 [IU] via SUBCUTANEOUS
  Administered 2017-07-05 – 2017-07-06 (×6): 3 [IU] via SUBCUTANEOUS
  Administered 2017-07-06 (×5): 5 [IU] via SUBCUTANEOUS
  Administered 2017-07-07 (×6): 3 [IU] via SUBCUTANEOUS
  Administered 2017-07-07: 5 [IU] via SUBCUTANEOUS
  Administered 2017-07-08 (×2): 3 [IU] via SUBCUTANEOUS
  Administered 2017-07-08: 8 [IU] via SUBCUTANEOUS
  Administered 2017-07-08: 5 [IU] via SUBCUTANEOUS
  Administered 2017-07-08: 3 [IU] via SUBCUTANEOUS
  Administered 2017-07-08 – 2017-07-09 (×3): 5 [IU] via SUBCUTANEOUS
  Administered 2017-07-09 (×3): 3 [IU] via SUBCUTANEOUS
  Administered 2017-07-09: 5 [IU] via SUBCUTANEOUS
  Administered 2017-07-10 (×4): 3 [IU] via SUBCUTANEOUS
  Administered 2017-07-10: 5 [IU] via SUBCUTANEOUS
  Administered 2017-07-10 – 2017-07-11 (×3): 3 [IU] via SUBCUTANEOUS
  Administered 2017-07-11: 2 [IU] via SUBCUTANEOUS
  Administered 2017-07-11 (×3): 3 [IU] via SUBCUTANEOUS
  Administered 2017-07-12 – 2017-07-13 (×6): 2 [IU] via SUBCUTANEOUS
  Administered 2017-07-13: 3 [IU] via SUBCUTANEOUS
  Administered 2017-07-13 – 2017-07-14 (×4): 2 [IU] via SUBCUTANEOUS
  Administered 2017-07-14 – 2017-07-15 (×5): 3 [IU] via SUBCUTANEOUS
  Administered 2017-07-15 – 2017-07-16 (×2): 2 [IU] via SUBCUTANEOUS
  Administered 2017-07-16 (×2): 3 [IU] via SUBCUTANEOUS
  Administered 2017-07-16 – 2017-07-17 (×4): 2 [IU] via SUBCUTANEOUS
  Administered 2017-07-17 (×2): 3 [IU] via SUBCUTANEOUS

## 2017-06-28 NOTE — Care Management Note (Signed)
Case Management Note  Patient Details  Name: Robert Keith MRN: [id number] Date of Birth: 1933-10-04  Subjective/Objective:     Pt admitted on 06/26/17 s/p stroke with TPA given.  Pt developed Rt thigh hematoma requiring surgical intervention.  PTA, pt resided at home and ambulated with a cane.              Action/Plan: Pt currently remains intubated.  Will follow as pt progresses.    Expected Discharge Date:                  Expected Discharge Plan:     In-House Referral:     Discharge planning Services  CM Consult  Post Acute Care Choice:    Choice offered to:     DME Arranged:    DME Agency:     HH Arranged:    HH Agency:     Status of Service:  In process, will continue to follow  If discussed at Long Length of Stay Meetings, dates discussed:    Additional Comments:  Reinaldo Raddle, RN, BSN  Trauma/Neuro ICU Case Manager [phone]

## 2017-06-28 NOTE — Progress Notes (Signed)
This AM, pt's HR is sustaining in mid 40-50's. Notified CCM via [place] of current pt situation and was informed to turn off vasopressin gtt in attempt to help with bradycardia. Will continue to monitor to assess for improvement.  Guadalupe Maple, RN

## 2017-06-28 NOTE — Progress Notes (Signed)
SLP Cancellation Note  Patient Details Name: KLAYTEN JOLLIFF MRN: [id number] DOB: 04-17-1933   Cancelled treatment:       Reason Eval/Treat Not Completed: Patient not medically ready, remains intubated.   Germain Osgood 06/28/2017, 9:28 AM  Germain Osgood, M.A. CCC-SLP [phone]

## 2017-06-28 NOTE — Progress Notes (Addendum)
   Daily Progress Note   Assessment/Planning:   POD #1 s/p R thigh hematoma evacuation, repair of R PFA   Vasopressor coming down: vaso d/c, levo gtt going down  H/H relatively stable. Some drop expect due to dilution, serial phlebotomy and diffuse ooze.  Serial H/H recommended  Expect large volume drain output at hematoma cavity is sizable   Subjective  - 1 Day Post-Op   No significant events overnight, H/H stable overnight, vaso d/c for bradycardia   Objective   Vitals:   06/28/17 0615 06/28/17 0630 06/28/17 0645 06/28/17 0700  BP: (!) 113/50 (!) 110/48 (!) 121/51 (!) 107/45  Pulse: (!) 40 (!) 41 (!) 45 63  Resp: 16 16 16 16   Temp:      TempSrc:      SpO2: 100% 100% 100% 100%  Weight:      Height:         Intake/Output Summary (Last 24 hours) at 06/28/17 0723 Last data filed at 06/28/17 0600  Gross per 24 hour  Intake          7560.57 ml  Output             3305 ml  Net          4255.57 ml    VASC R thigh swollen and echymotic but less tense, both drains 580 cc serosang drainage, inc bandaged without active bleeding    Laboratory   CBC CBC Latest Ref Rng & Units 06/28/2017 06/28/2017 06/27/2017  WBC 4.0 - 10.5 K/uL - - -  Hemoglobin 13.0 - 17.0 g/dL 9.0(L) 9.2(L) 9.8(L)  Hematocrit 39.0 - 52.0 % 25.1(L) 26.2(L) 27.6(L)  Platelets 150 - 400 K/uL - - -    BMET    Component Value Date/Time   NA 138 06/27/2017 1828   NA 142 12/22/2014 1430   K 4.8 06/27/2017 1828   K 4.3 12/22/2014 1430   CL 111 06/27/2017 1828   CL 108 (H) 12/22/2014 1430   CO2 19 (L) 06/27/2017 1828   CO2 27 12/22/2014 1430   GLUCOSE 304 (H) 06/27/2017 1828   GLUCOSE 119 (H) 12/22/2014 1430   BUN 19 06/27/2017 1828   BUN 20 (H) 12/22/2014 1430   CREATININE 1.50 (H) 06/27/2017 1828   CREATININE 1.13 12/22/2014 1430   CALCIUM 6.8 (L) 06/27/2017 1828   CALCIUM 8.8 12/22/2014 1430   GFRNONAA 42 (L) 06/27/2017 1828   GFRNONAA >60 12/22/2014 1430   GFRAA 48 (L) 06/27/2017 1828   GFRAA >60 12/22/2014 1430     Adele Barthel, MD, FACS [facility] Office: [phone] Pager: [phone]  06/28/2017, 7:23 AM

## 2017-06-28 NOTE — Progress Notes (Signed)
PULMONARY / CRITICAL CARE MEDICINE   Name: Robert Keith MRN: [id number] DOB: 27-Jan-1933    ADMISSION DATE:  06/26/2017 CONSULTATION DATE:  06/26/17  REFERRING MD:  Dr. Leonie Man  CHIEF COMPLAINT:  CVA  HISTORY OF PRESENT ILLNESS:   83 year old man  presented 7/11 from home with witnessed onset of right facial droop, right hemiplegia and aphasia .  CT head showed a hyperdense left middle cerebral artery and chronic small left frontal subdural (unchanged from prior in 2014).  TPA was given   CTA showed left M1 occlusion and therefore patient taken to neuro IR for mechanical thrombectomy and revascularization.    past medical history of afib (not on NOAC 2/2 GI bleed), HTN, prior SDH in 2014 that required evacuation with residual small chronic SDH), DM, CAD, tremor, H. Pylori infection, and prostate cancer         STUDIES:  7/11 Head CT >> focal hyperdensity of left distal M1 may represent thrombus, no vascular territory infarct indentified, small left frontal SDH stable in size and attenuation from 2014, ASPECTS is 10 7/11 CTA head and neck>> emergent large vessel occlusion of left M1 segment, some reconstitution of anterior left MCA branch vessels from pial collaterals  7/12 CT abd >> hematoma in the anterior RIGHT thigh measuring 5.6 x 5.3 cm  7/12 MRI >> evolved lt MCA infarct  CULTURES: none  ANTIBIOTICS: 7/11 Ancef (pre-op)  SIGNIFICANT EVENTS: 7/11  Admit  7/11 right femoral sheath found to be kinked, TPA reversal deemed to risky. Sheath pulled in PACU.  7/12 >> emergent OR for left groin hematoma , profunda femoral repaired  LINES/TUBES: 7/11 ETT >> 7/11 OGT>> 7/11 Left Radial Aline >> 7/11 Right Femoral Sheath > 7/11    SUBJECTIVE:  Critically ill, intubated  Bradycardic this am Remains on levo gtt Afebrile   VITAL SIGNS: BP (!) 98/42 (BP Location: Right Arm)   Pulse 86   Temp 98.2 F (36.8 C) (Axillary)   Resp 14   Ht 5\' 9"  (1.753 m)   Wt 90.3 kg (199 lb 1.2  oz)   SpO2 100%   BMI 29.40 kg/m   HEMODYNAMICS:    VENTILATOR SETTINGS: Vent Mode: PSV;CPAP FiO2 (%):  [40 %] 40 % Set Rate:  [14 bmp] 14 bmp Vt Set:  [570 mL] 570 mL PEEP:  [5 cmH20] 5 cmH20 Pressure Support:  [5 cmH20-8 cmH20] 5 cmH20 Plateau Pressure:  [13 cmH20-15 cmH20] 15 cmH20  INTAKE / OUTPUT: I/O last 3 completed shifts: In: 8500.3 [I.V.:5919.3; Blood:2081; IV [id number]:920] Out: 4280 [Urine:2475; Drains:580; Blood:1225]  PHYSICAL EXAMINATION: General: Elderly male,acutely ill HEENT: MM pink/moist, ETT, OGT,  Neuro: hard of hearing, rt hemiplegia , moves toes on command CV: s1s2 rrr, no m/r/g PULM: even/non-labored, lungs clear bilaterally FE:OFHQ, non-tender, bsx4 active  Extremities: warm/dry, no edema  Skin: no rashes or lesions   LABS:  BMET  Recent Labs Lab 06/27/17 0314 06/27/17 1355 06/27/17 1719 06/27/17 1828  NA 139 136 144 138  K 3.9 4.6 5.5* 4.8  CL 114* 114*  --  111  CO2 20* 12*  --  19*  BUN 16 17  --  19  CREATININE 0.89 1.33*  --  1.50*  GLUCOSE 165* 312*  --  304*    Electrolytes  Recent Labs Lab 06/27/17 0314 06/27/17 1355 06/27/17 1828  CALCIUM 7.4* 7.3* 6.8*  MG 1.7  --   --   PHOS 3.2  --   --  CBC  Recent Labs Lab 06/27/17 1355  06/27/17 1828 06/27/17 2321 06/28/17 0308 06/28/17 0652  WBC 14.8*  --  11.6* 16.1*  --   --   HGB 8.7*  < > 9.6* 9.8*  9.9* 9.2* 9.0*  HCT 26.6*  < > 29.0* 27.6*  28.2* 26.2* 25.1*  PLT 130*  --  69* 85*  --   --   < > = values in this interval not displayed.  Coag's  Recent Labs Lab 06/26/17 1420 06/27/17 1515 06/27/17 1828  APTT 29 31 31   INR 1.13 1.67 1.59    Sepsis Markers  Recent Labs Lab 06/27/17 1827  LATICACIDVEN 5.1*    ABG  Recent Labs Lab 06/26/17 2130 06/27/17 1533 06/27/17 1719  PHART 7.410 7.613* 7.234*  PCO2ART 37.1 34.5 45.1  PO2ART 251.0* 183.0* 474.0*    Liver Enzymes  Recent Labs Lab 06/26/17 1420  AST 34  ALT 33   ALKPHOS 88  BILITOT 0.9  ALBUMIN 3.6    Cardiac Enzymes  Recent Labs Lab 06/27/17 1355  TROPONINI <0.03    Glucose  Recent Labs Lab 06/27/17 1247 06/27/17 1931 06/27/17 1935 06/27/17 2325 06/28/17 0333 06/28/17 0800  GLUCAP 189* 271* 244* 226* 228* 201*    CXR - ETT in position, reviewed 7/12    DISCUSSION: 64 yoM with PMH of Afib- not on coag  With Left MCA-CVA s/p neuro IR  ASSESSMENT / PLAN:  PULMONARY A: Post op resp failure  P:   Tolerating SBTs - goal one way extubation but hold off x 24 h to see if mental status improves & comes off pressors  CARDIOVASCULAR A:  Hemorrhagic shock Afib - not on anticoagulation 2/2 previous GI bleed Hx CAD, HTN  Echo 09/2016 >> RVSP 50, mild AS P:  SBP goal 120-140 Levo gtt, titrate Dc vaso  RENAL A:   Hypocalcemia AKI related to hypotension P:   Trend BMP / urinary output Replace electrolytes as indicated Avoid nephrotoxic agents, ensure adequate renal perfusion  GASTROINTESTINAL A:   Nutrition Needs  P:   NPO PPI  Start trickle TF  HEMATOLOGIC A:   Anemia- mild , chronic + acute blood loss Rt thigh hematoma s/p profunda repair, drains P:  Trend CBC q 12 Monitor drain output - expect some per vascular, as large hematoma  SCDs     ENDOCRINE A:   DM   P:   CBG q 4  SSI  NEUROLOGIC A:   Left M1 occlusion s/p TPA and mechanical thrombectomy w/revascularization  - with right hemiplegia, global aphasia and left gaze   Chronic small left frontal SDH from prior SDH in 2014 that required  Hx prior SDH in 2014 requiring craniotomy  P:   RASS goal: 0 Dc precedex Fentanyl PRN Management per Neuro   FAMILY  - Updates: daughters + son at bedside , Byron to optimise before one way extubation  - Inter-disciplinary family meet or Palliative Care meeting due by:  Done, 7/12  My CC Time: 26 minutes    Kara Mead MD. [facility]. [place] [facility] Pager [phone] If no response  call [phone]   06/28/2017

## 2017-06-28 NOTE — Progress Notes (Signed)
OT Cancellation Note  Patient Details Name: Robert Keith MRN: [id number] DOB: [birth year]   Cancelled Treatment:    Reason Eval/Treat Not Completed: Patient not medically ready  Leslie, OT/L  [phone] 06/28/2017 06/28/2017, 7:25 AM

## 2017-06-28 NOTE — Progress Notes (Signed)
PT Cancellation Note  Patient Details Name: MAN EFFERTZ MRN: [id number] DOB: [birth year]   Cancelled Treatment:    Reason Eval/Treat Not Completed: Patient not medically ready for therapies at this time.   Thelma Comp 06/28/2017, 7:31 AM   Rolinda Roan, PT, DPT Acute Rehabilitation Services Pager: [phone]

## 2017-06-28 NOTE — Progress Notes (Signed)
Patient's HR went into the 140's when the [facility] team was placing the line. Pain medication given and sedation restarted for procedure. HR in the 90's-110's at this time. Sedation will be stopped again. BP within normal range on Levo. Frank Pilger, Rande Brunt, RN

## 2017-06-28 NOTE — Progress Notes (Signed)
STROKE TEAM PROGRESS NOTE   HISTORY OF PRESENT ILLNESS (per record) [name] is a 83 y.o. male with history of prostate cancer, CAD, DM2, essential tremor, hypertension, and subdural hematoma, s/p craniectomy and clot evacuation who presented with right facial droop, dense right hemiplegia global aphasia, and left gaze deviation. He recevied tPA at 1507 on 06/26/2017.  [name] was unable to provide history due to his stroke. History is obtained from EMS as well as patient's health power of attorney and daughter whom I spoke to over the phone. Patient was at home in his usual state of health until 1:30 PM when he had witnessed onset of fall with right facial droop, dense right hemiplegia global aphasia, and left gaze deviation. CT head with hyperdense left MCA and ASPECTS score of 10. Patient had a chronic small left frontal subdural hematoma which appear to be unchanged from prior scan from 2014. Patient had prior history of subdural hematoma requiring craniotomy in 2014. At baseline he was fairly functional, walking with a cane and managing his own affairs. Patient has history of atrial fibrillation but because of prior GI bleed has not been on anticoagulation as per the family.  IV tPA was initially started but the infusion was held till I spoke to the patient's daughter over the phone as well as health power of attorney and explained the increased risk involved with IV TPA and since intervention would likely be delayed for about an hour since he had another patient on the table getting mechanical thrombectomy. Other alternatives including transfer to [facility], discussed with the family but it was clear that that would also involve a delay of an hour.  After careful discussion about increased risk of bleeding intracranially with IV tPA given prior history of subdural hematoma the daughter and health power of attorney agreed to give IV tPA. I also discussed the case with Dr. Demetra Shiner,  vascular neurologist, for a second opinion and he agreed with my assessment and treatment plan. TPA infusion was then restarted after the above discussion.  Interventional team was informed about the case after the CT angiogram confirmed left M1 occlusion and CT perfusion showed favorable characteristics for revascularization.  The patient was revascularized in IR without complication.     LSN: 1:30 PM on 06/26/17 tPA Given: Yes NIH Stroke scale on admission 33 Modified Rankin scale 2  Patient was administered IV t-PA at 1507 on 06/26/2017. He was admitted to the neuro ICU for further evaluation and treatment.   SUBJECTIVE (INTERVAL HISTORY) His family is at the bedside.    Patient has increasing right thigh hematoma yesterday leading to hemodynamic instability. Neuro IR team was unable to contain and hence vascular surgery was consulted and patient was taken to the OR and bleeding profunda femoris artery was sutured and there was a large hematoma which was evacuated  And patient condition stabilized. Family expressed understanding, and intends to keep the patient DNR status.  Family agreeable to the plan of watching the patient one or two more days for possible improvement, and then extubating.   OBJECTIVE Temp:  [97.8 F (36.6 C)-100.3 F (37.9 C)] 99.1 F (37.3 C) (07/13 1200) Pulse Rate:  [28-120] 105 (07/13 1300) Cardiac Rhythm: Atrial fibrillation (07/13 0800) Resp:  [11-36] 17 (07/13 1300) BP: (44-157)/(13-101) 96/48 (07/13 1300) SpO2:  [84 %-100 %] 100 % (07/13 1300) Arterial Line BP: (61-174)/(41-79) 118/45 (07/13 1300) FiO2 (%):  [40 %] 40 % (07/13 1127) Weight:  [224  lb 1.2 oz (90.3 kg)] 199 lb 1.2 oz (90.3 kg) (07/13 0334)  CBC:  Recent Labs Lab 06/26/17 1420  06/27/17 0314  06/27/17 1828 06/27/17 2321 06/28/17 0308 06/28/17 0652  WBC 5.9  --  11.1*  < > 11.6* 16.1*  --   --   NEUTROABS 3.1  --  9.4*  --   --   --   --   --   HGB 12.3*  < > 8.8*  < > 9.6* 9.8*  9.9*  9.2* 9.0*  HCT 37.9*  < > 26.9*  < > 29.0* 27.6*  28.2* 26.2* 25.1*  MCV 92.2  --  92.1  < > 87.3 83.2  --   --   PLT 195  --  153  < > 69* 85*  --   --   < > = values in this interval not displayed.  Basic Metabolic Panel:  Recent Labs Lab 06/27/17 0314  06/27/17 1828 06/28/17 0913  NA 139  < > 138 140  K 3.9  < > 4.8 3.4*  CL 114*  < > 111 111  CO2 20*  < > 19* 21*  GLUCOSE 165*  < > 304* 185*  BUN 16  < > 19 19  CREATININE 0.89  < > 1.50* 1.24  CALCIUM 7.4*  < > 6.8* 7.1*  MG 1.7  --   --   --   PHOS 3.2  --   --   --   < > = values in this interval not displayed.  Lipid Panel:     Component Value Date/Time   CHOL 115 06/27/2017 0500   TRIG 86 06/27/2017 0500   HDL 20 (L) 06/27/2017 0500   CHOLHDL 5.8 06/27/2017 0500   VLDL 17 06/27/2017 0500   LDLCALC 78 06/27/2017 0500   HgbA1c:  Lab Results  Component Value Date   HGBA1C 6.8 (H) 06/27/2017   Urine Drug Screen: No results found for: LABOPIA, COCAINSCRNUR, LABBENZ, AMPHETMU, THCU, LABBARB  Alcohol Level No results found for: [facility]  IMAGING  Ct Abdomen Pelvis Wo Contrast 06/27/2017 IMPRESSION: 1. Hematoma within the proximal RIGHT anterior thigh is incompletely imaged. Hematoma likely spontaneous related to anticoagulation. Recommend clinical correlation with extent of hematoma in the RIGHT thigh. 2. No intraperitoneal or retroperitoneal hemorrhage. 3. Extensive diverticulosis without diverticulitis. 4.  Aortic Atherosclerosis (ICD10-I70.0). 5. Small bilateral pleural effusions. These results will be called to the ordering clinician or representative by the Radiologist Assistant, and communication documented in the PACS or zVision Dashboard.  Ct Angio Head W and Wo Contrast 06/26/2017 IMPRESSION: 1. Emergent large vessel occlusion of the left M1 segment. 2. There is some reconstitution of anterior left MCA branch vessels from pial collaterals. 3. Dense calcifications at the carotid bifurcations bilaterally without  significant stenoses relative to the more distal vessels. 4. Moderate narrowing of the proximal posterior cerebral arteries bilaterally, right greater than left. 5. Calcifications at great vessel origins along the arch without significant stenosis. 6. Hypoplastic left vertebral artery. 7. Multilevel cervical spinal spondylosis. 8. Emphysema. 9. Prior granulomatous disease in the right upper lobe.  Mr Brain Wo Contrast 06/27/2017 IMPRESSION: Completed acute infarction affecting the majority of the inferior division left MCA distribution. This affects the left temporal lobe and posterior frontal lobe with patchy involvement in the parietal region. Infarction of the caudate and putamen as well. Petechial blood product deposition without focal hematoma. Swelling but no shift at this time.  Ct Cerebral Perfusion W Contrast 06/26/2017 IMPRESSION:  Left M1 emergent large vessel occlusion with associated acute left MCA territory infarct. Core infarct is estimated at 49 mL. The tissue at risk is estimated at 182 mL.   Dg Chest Port 1 View 06/27/2017 IMPRESSION: 1. Well-positioned endotracheal tube. 2. Stable patchy opacity and volume loss at the left lung base, favor scarring or atelectasis. 3. Probable stable trace left pleural effusion.   Dg Chest Port 1 View 06/26/2017 IMPRESSION: Endotracheal tube 3 cm above the carina. Left lower lobe atelectasis or infiltrate.   Ct Head Code Stroke W/o Cm 06/26/2017 IMPRESSION: 1. Focal hyperdensity in left distal M1 may represent thrombus. 2. No vascular territory infarct identified at this time. 3. Small left frontal subdural hematoma is stable in size and attenuation from 2014. 4. ASPECTS is 10    PHYSICAL EXAM Frail elderly Caucasian male who is intubated. He has right   thigh hematoma. . Afebrile. Head is nontraumatic. Neck is supple without bruit.    Cardiac exam no murmur or gallop. Lungs are clear to auscultation. Distal pulses are well felt. Neurological  Exam :  Patient is stuporous with eyes closed. Opens eyes partially to sternal rub. Left gaze preference dorsi movements sluggish. Pupils irregular but reactive. Does not blink to threat on either side. Right lower facial asymmetry. Tongue midline. Patient does withdraw left upper and lower extremity due to painful stimuli. Trace withdrawal of the right lower extremity to pain but none in the left upper right upper extremity. Tone is diminished on the right side. Right plantar equivocal left downgoing ASSESSMENT/PLAN [name] is a 83 y.o. male with history of prostate cancer, CAD, DM2, essential tremor, hypertension, and subdural hematoma, s/p craniectomy and clot evacuation who presented with right facial droop, dense right hemiplegia global aphasia, and left gaze deviation. He recevied tPA at 1507 on 06/26/2017.     Stroke: Emergent large vessel occlusion of the left M1 segment with some reconstitution of anterior left MCA branch vessels from pial collateral in the setting of densely calcified bilateral carotid bifurcations and diffuse large vessel athersclerosis  Right femoral pseudoaneurysm with massive thigh hematoma due to profunda femoris artery tear requiring surgical suturing and clot evacuation  Resultant  aphasia and right hemiplegia  CT head:  Focal hyperdensity in left distal M1 may represent thrombus. 2. No vascular territory infarct identified at this time. 3. Small left frontal subdural hematoma is stable in size and attenuation from 2014. 4. ASPECTS is 10   MRI head: Completed acute infarction affecting the majority of the inferior division left MCA distribution. This affects the left temporal lobe and posterior frontal lobe with patchy involvement in the parietal region. Infarction of the caudate and putamen as well. Petechial blood product deposition without focal hematoma. Swelling but no shift at this time.  CT Angio Head: Emergent large vessel occlusion of the left M1  segment. 2. There is some reconstitution of anterior left MCA branch vessels from pial collaterals. 3. Dense calcifications at the carotid bifurcations bilaterally without significant stenoses relative to the more distal vessels. 4. Moderate narrowing of the proximal posterior cerebral arteries bilaterally, right greater than left. 5. Calcifications at great vessel origins along the arch without significant stenosis. 6. Hypoplastic left vertebral artery. 7. Multilevel cervical spinal spondylosis. 8. Emphysema. 9. Prior granulomatous disease in the right upper lobe.  Carotid Doppler  not ordered  2D Echo   pending   LDL 78  HgbA1c pending   SCDs for VTE prophylaxis Diet NPO time specified  No antithrombotic prior to admission, now on No antithrombotic  Patient counseled to be compliant with his antithrombotic medications  Ongoing aggressive stroke risk factor management  Therapy recommendations:  pending  Disposition:   ending  Hyperlipidemia  Home meds: pravastatin 40mg  PO daily  LDL 78, goal < 70  Statin held due to [place]  Continue statin at discharge  Diabetes  HgbA1c  pending, goal < 7.0  Controlled  Other Stroke Risk Factors  Advanced age  Overweight, Body mass index is 29.4 kg/m., recommend weight loss, diet and exercise as appropriate   Family hx stroke (Mother)  Coronary artery disease  Other Active Problems  Right thigh hematoma post interventional procedure with anemia and mild hypotension  Hospital day # 2  I have personally examined this patient, reviewed notes, independently viewed imaging studies, participated in medical decision making and plan of care.ROS completed by me personally and pertinent positives fully documented  I have made any additions or clarifications directly to the above note.  The patient presented with left MCA infarct and underwent thrombolyzes with IV TPA and successful mechanical thrombectomy however MRI scan shows moderate  size infarct and he also has a right thigh hematoma postprocedure. Recommend hemodynamic support with fluids, vasopressors and blood transfusion. Neuro interventional team and  vascular surgery  following thigh hematoma     . I had a long discussion with the patient's son and daughter at the bedside as well as health power of attorney and recommend continuing aggressive care for the next few days to see if patient's condition improves. The family is quite clear that he did not want prolonged ventilatory support and if his condition deteriorated further they may consider withdrawal of care. Plan to support him for the next couple of days and see if he can be extubated 1 day with no plans for intubation This patient is critically ill and at significant risk of neurological worsening, death and care requires constant monitoring of vital signs, hemodynamics,respiratory and cardiac monitoring, extensive review of multiple databases, frequent neurological assessment, discussion with family, other specialists and medical decision making of high complexity.I have made any additions or clarifications directly to the above note.This critical care time does not reflect procedure time, or teaching time or supervisory time of PA/NP/Med Resident etc but could involve care discussion time. Discussed with Dr. Estanislado Pandy and Elsworth Soho  I spent  40 minutes of neurocritical care time  in the care of  this patient.      Antony Contras, MD Medical Director [facility] Pager: [phone] 06/28/2017 1:49 PM   To contact Stroke Continuity provider, please refer to [email or web address] After hours, contact General Neurology

## 2017-06-28 NOTE — Progress Notes (Signed)
Cortrak Tube Team Note:  Consult received to place a Cortrak feeding tube.   A 10 F Cortrak tube was placed in the right nare and secured with a nasal bridle at 80 cm. Per the Cortrak monitor reading the tube tip is stomach  Difficult tube placement, abdominal x-ray has been ordered by the [facility] team for this reason to verify correct placement. Please confirm tube placement before using the Cortrak tube.   If the tube becomes dislodged please keep the tube and contact the Cortrak team at [email or web address] ([email or web address]) for replacement.  If after hours and replacement cannot be delayed, place a NG tube and confirm placement with an abdominal x-ray.    Kerman Passey MS, RD, LDN [phone] Pager  [phone] Weekend/On-Call Pager

## 2017-06-28 NOTE — Progress Notes (Signed)
Nutrition Follow-up  DOCUMENTATION CODES:   Not applicable  INTERVENTION:   -Continue trickle feedings of Vital High Protein @ 20 ml/hr via cortrak tube  Tube feeding regimen provides 480 kcals (25% of needs), 42 grams protein, and 401 ml water daily.   -If plan to advance TF, recommend:  Initiate TF via cortrak tube with  Vital AF 1.2 at goal rate of 65 ml/h (1560 ml per day) to provide 1872 kcals, 117 gm protein, 1265 ml free water daily.  NUTRITION DIAGNOSIS:   Inadequate oral intake related to inability to eat as evidenced by NPO status.  Ongoing  GOAL:   Patient will meet greater than or equal to 90% of their needs  Progressing  MONITOR:   Vent status, Labs, Weight trends, TF tolerance, Skin, I & O's  REASON FOR ASSESSMENT:   Consult Enteral/tube feeding initiation and management  ASSESSMENT:   83 year old male with past medical history of afib (not on NOAC 2/2 GI bleed), HTN, prior SDH in 2014 that required evacuation with residual small chronic SDH), DM, CAD, tremor, H. Pylori infection, and prostate cancer who presented 7/11 from home with witnessed onset of right facial droop, right hemiplegia and aphasia at 1330.   7/11- right femoral sheath found to be kinked, TPA reversal deemed to risky. Sheath pulled in PACU 7/11- S/P complete revascularization of occluded Lt MCA M1 seg with x 1 pass with Solitaire 67mm x25mm retrieval device achieving a TICI 3 reperfusion 7/12- CT abd revealed hematoma in the anterior RIGHT thigh measuring 5.6 x 5.3 cm 7/12- s/p emergency right groin exploration and repair of femoral artery  Patient remains intubated on ventilator support.  MV: 9.5 L/min Temp (24hrs), Avg:98.8 F (37.1 C), Min:97.8 F (36.6 C), Max:100.3 F (37.9 C)  Pt underwent cortrak tube placement earlier today. KUB verified tip of tube in distal stomach.   Trickle TF initiated; Vital High Protein infusing via cortrak tube at 20 ml/hr (providing 480 kcals, 42  grams protein, and 401 ml water daily, meeting 25% of estimated kcal needs and 44% of estimated kcal needs).   Per MD notes, plan to medically optimize pt for 1-2 days prior to one way extubation.   Labs reviewed: CBGS: 132-201, K: 3.4.   Diet Order:  Diet NPO time specified  Skin:   (closed rt groin incision)  Last BM:  PTA  Height:   Ht Readings from Last 1 Encounters:  06/26/17 5\' 9"  (1.753 m)    Weight:   Wt Readings from Last 1 Encounters:  06/28/17 199 lb 1.2 oz (90.3 kg)    Ideal Body Weight:  72.7 kg  BMI:  Body mass index is 29.4 kg/m.  Estimated Nutritional Needs:   Kcal:  1947.1  Protein:  95-110 grams  Fluid:  1.9-2.1 L  EDUCATION NEEDS:   No education needs identified at this time  Alline Pio A. Jimmye Norman, RD, LDN, CDE Pager: [phone] After hours Pager: [phone]

## 2017-06-28 NOTE — Progress Notes (Signed)
[facility] Progress Note Patient Name: TRESTIN VENCES DOB: October 02, 1933 MRN: [id number]   Date of Service  06/28/2017  HPI/Events of Note  hyperglycemia  eICU Interventions  Change to SSI from ICU hyperglycemia protocol     Intervention Category Major Interventions: Hyperglycemia - active titration of insulin therapy  Simonne Maffucci 06/28/2017, 3:41 AM

## 2017-06-28 NOTE — Progress Notes (Signed)
Pt's CBG elevated greater than 225 for all glucose checks during shift - wanted to discuss with CCM regarding pt current hyperglycemia prior to initiating insulin gtt. After discussion, MD changed pt to sliding scale insulin and I will be holding off on initiating gtt at this time.   Will continue to monitor closely.  Guadalupe Maple, RN

## 2017-06-28 NOTE — Progress Notes (Signed)
HR sustaining 110-130. BP stable. Levo on low dose at this time. MD paged. No new orders given.

## 2017-06-28 NOTE — Progress Notes (Signed)
  Echocardiogram 2D Echocardiogram has been performed.  Robert Keith G Wadsworth Skolnick 06/28/2017, 9:25 AM

## 2017-06-29 ENCOUNTER — Inpatient Hospital Stay (HOSPITAL_COMMUNITY): Payer: Medicare Other

## 2017-06-29 DIAGNOSIS — T81719A Complication of unspecified artery following a procedure, not elsewhere classified, initial encounter: Secondary | ICD-10-CM

## 2017-06-29 DIAGNOSIS — J9600 Acute respiratory failure, unspecified whether with hypoxia or hypercapnia: Secondary | ICD-10-CM

## 2017-06-29 DIAGNOSIS — I6203 Nontraumatic chronic subdural hemorrhage: Secondary | ICD-10-CM

## 2017-06-29 LAB — POCT I-STAT 3, ART BLOOD GAS (G3+)
Acid-base deficit: 7 mmol/L — ABNORMAL HIGH (ref 0.0–2.0)
Bicarbonate: 16.4 mmol/L — ABNORMAL LOW (ref 20.0–28.0)
O2 Saturation: 89 %
PCO2 ART: 25.2 mmHg — AB (ref 32.0–48.0)
PH ART: 7.423 (ref 7.350–7.450)
PO2 ART: 54 mmHg — AB (ref 83.0–108.0)
TCO2: 17 mmol/L (ref 0–100)

## 2017-06-29 LAB — CBC
HCT: 21.9 % — ABNORMAL LOW (ref 39.0–52.0)
HEMATOCRIT: 24.4 % — AB (ref 39.0–52.0)
HEMOGLOBIN: 8.4 g/dL — AB (ref 13.0–17.0)
Hemoglobin: 7.8 g/dL — ABNORMAL LOW (ref 13.0–17.0)
MCH: 29.9 pg (ref 26.0–34.0)
MCH: 29.7 pg (ref 26.0–34.0)
MCHC: 35.6 g/dL (ref 30.0–36.0)
MCHC: 34.4 g/dL (ref 30.0–36.0)
MCV: 83.9 fL (ref 78.0–100.0)
MCV: 86.2 fL (ref 78.0–100.0)
PLATELETS: 74 10*3/uL — AB (ref 150–400)
Platelets: 87 10*3/uL — ABNORMAL LOW (ref 150–400)
RBC: 2.61 MIL/uL — ABNORMAL LOW (ref 4.22–5.81)
RBC: 2.83 MIL/uL — ABNORMAL LOW (ref 4.22–5.81)
RDW: 15.9 % — AB (ref 11.5–15.5)
RDW: 15.9 % — ABNORMAL HIGH (ref 11.5–15.5)
WBC: 9.3 10*3/uL (ref 4.0–10.5)
WBC: 7.4 10*3/uL (ref 4.0–10.5)

## 2017-06-29 LAB — BASIC METABOLIC PANEL
ANION GAP: 6 (ref 5–15)
Anion gap: 7 (ref 5–15)
BUN: 16 mg/dL (ref 6–20)
BUN: 15 mg/dL (ref 6–20)
CHLORIDE: 111 mmol/L (ref 101–111)
CHLORIDE: 115 mmol/L — AB (ref 101–111)
CO2: 23 mmol/L (ref 22–32)
CO2: 21 mmol/L — ABNORMAL LOW (ref 22–32)
CREATININE: 0.97 mg/dL (ref 0.61–1.24)
Calcium: 7.4 mg/dL — ABNORMAL LOW (ref 8.9–10.3)
Calcium: 7.4 mg/dL — ABNORMAL LOW (ref 8.9–10.3)
Creatinine, Ser: 0.9 mg/dL (ref 0.61–1.24)
GFR calc Af Amer: >60 mL/min (ref >60)
GFR calc Af Amer: >60 mL/min (ref >60)
GFR calc non Af Amer: >60 mL/min (ref >60)
GLUCOSE: 184 mg/dL — AB (ref 65–99)
GLUCOSE: 186 mg/dL — AB (ref 65–99)
POTASSIUM: 3.4 mmol/L — AB (ref 3.5–5.1)
POTASSIUM: 3.4 mmol/L — AB (ref 3.5–5.1)
SODIUM: 142 mmol/L (ref 135–145)
Sodium: 141 mmol/L (ref 135–145)

## 2017-06-29 LAB — GLUCOSE, CAPILLARY
GLUCOSE-CAPILLARY: 160 mg/dL — AB (ref 65–99)
Glucose-Capillary: 162 mg/dL — ABNORMAL HIGH (ref 65–99)
Glucose-Capillary: 164 mg/dL — ABNORMAL HIGH (ref 65–99)
Glucose-Capillary: 165 mg/dL — ABNORMAL HIGH (ref 65–99)
Glucose-Capillary: 172 mg/dL — ABNORMAL HIGH (ref 65–99)
Glucose-Capillary: 213 mg/dL — ABNORMAL HIGH (ref 65–99)

## 2017-06-29 LAB — LACTIC ACID, PLASMA
LACTIC ACID, VENOUS: 2.4 mmol/L — AB (ref 0.5–1.9)
Lactic Acid, Venous: 2.8 mmol/L (ref 0.5–1.9)

## 2017-06-29 LAB — MAGNESIUM: Magnesium: 1.7 mg/dL (ref 1.7–2.4)

## 2017-06-29 LAB — PHOSPHORUS: Phosphorus: 2.1 mg/dL — ABNORMAL LOW (ref 2.5–4.6)

## 2017-06-29 MED ORDER — DEXTROSE 5 % IV SOLN
1.0000 g | Freq: Three times a day (TID) | INTRAVENOUS | Status: DC
  Administered 2017-06-30 – 2017-07-06 (×20): 1 g via INTRAVENOUS
  Filled 2017-06-29 (×25): qty 1

## 2017-06-29 MED ORDER — VANCOMYCIN HCL 10 G IV SOLR
2000.0000 mg | Freq: Once | INTRAVENOUS | Status: AC
  Administered 2017-06-30: 2000 mg via INTRAVENOUS
  Filled 2017-06-29: qty 2000

## 2017-06-29 MED ORDER — POTASSIUM CHLORIDE 20 MEQ PO PACK
40.0000 meq | PACK | Freq: Once | ORAL | Status: AC
  Administered 2017-06-29: 40 meq
  Filled 2017-06-29: qty 2

## 2017-06-29 MED ORDER — VANCOMYCIN HCL 10 G IV SOLR
1250.0000 mg | Freq: Two times a day (BID) | INTRAVENOUS | Status: DC
  Administered 2017-06-30 – 2017-07-02 (×4): 1250 mg via INTRAVENOUS
  Filled 2017-06-29 (×5): qty 1250

## 2017-06-29 MED ORDER — METOPROLOL TARTRATE 5 MG/5ML IV SOLN
2.5000 mg | Freq: Once | INTRAVENOUS | Status: AC
  Administered 2017-06-29: 2.5 mg via INTRAVENOUS

## 2017-06-29 MED ORDER — PRAVASTATIN SODIUM 40 MG PO TABS
40.0000 mg | ORAL_TABLET | Freq: Every day | ORAL | Status: DC
  Administered 2017-06-29 – 2017-07-04 (×6): 40 mg via ORAL
  Filled 2017-06-29 (×6): qty 1

## 2017-06-29 MED ORDER — SODIUM BICARBONATE 8.4 % IV SOLN
INTRAVENOUS | Status: AC
  Administered 2017-06-29: 18:00:00 via INTRAVENOUS
  Filled 2017-06-29: qty 100

## 2017-06-29 MED ORDER — PHENYLEPHRINE HCL 10 MG/ML IJ SOLN
0.0000 ug/min | INTRAMUSCULAR | Status: DC
  Administered 2017-06-29: 20 ug/min via INTRAVENOUS
  Administered 2017-06-30 (×2): 40 ug/min via INTRAVENOUS
  Filled 2017-06-29 (×4): qty 1

## 2017-06-29 MED ORDER — METOPROLOL TARTRATE 50 MG PO TABS
50.0000 mg | ORAL_TABLET | Freq: Two times a day (BID) | ORAL | Status: DC
  Administered 2017-06-29 – 2017-06-30 (×2): 50 mg via ORAL
  Filled 2017-06-29 (×2): qty 1

## 2017-06-29 NOTE — Progress Notes (Signed)
[facility] Progress Note Patient Name: Robert Keith DOB: Apr 26, 1933 MRN: [id number]   Date of Service  06/29/2017  HPI/Events of Note  Contacted by bedside nurse regarding ongoing shock. Blood pressure parameters as per neurology/attending. Patient now becoming febrile. Mild lactic acidosis/metabolic acidosis. Previously started on bicarbonate drip. Chest x-ray revealed bilateral infiltrates. Question evolving sepsis although no leukocytosis. Patient at risk for healthcare associated organisms. Arterial line waveform questionable likely secondary to aortic stenosis and regurg. Manual bedside cuff pressure reassuring.   eICU Interventions  1. Continuing low-dose pressor 2. Continuing low-dose bicarbonate drip 3. Repeat ABG now 4. Trending Procalcitonin per algorithm 5. Blood, urine, and tracheal aspirate cultures ordered 6. Urinalysis ordered 7. Starting empiric Fortaz & vancomycin with low threshold to de-escalate depending upon results      Intervention Category Major Interventions: Other:;Shock - evaluation and management  Tera Partridge 06/29/2017, 11:38 PM

## 2017-06-29 NOTE — Progress Notes (Signed)
[facility] Progress Note Patient Name: Robert Keith DOB: 1933-02-11 MRN: [id number]   Date of Service  06/29/2017  HPI/Events of Note  7.42/25/54/16.4 Compensated metabolic acidosis. Cause of acidosis unclear.  Patient has afib with RVR but good bp.  Hgb was low this am.  Repeat hgb pending.    eICU Interventions  Stop NS Low rate bicarb drip Await cbc result Send bmp and lactic acid stat Start oral lopressor for HR control.  Patient was on as outpatient     Intervention Category Major Interventions: Acid-Base disturbance - evaluation and management  Mauri Brooklyn, P 06/29/2017, 5:36 PM

## 2017-06-29 NOTE — Progress Notes (Signed)
[facility] Progress Note Patient Name: Robert Keith DOB: [birth year] MRN: [id number]   Date of Service  06/29/2017  HPI/Events of Note  Called by nurse for tachypnea and tachycardia.  Os Sat greater than 95% on 45% FIO2.  Good air movement on lung exam noted.  Patient not on vasopressor support or sedation.  He is s/p CVA.  eICU Interventions  cxr and ABG now. Patient had received 50 mcg of fentanyl and 1 mg of versed. Asked nurse to given another 50 mcg of fentanyl     Intervention Category Intermediate Interventions: Arrhythmia - evaluation and management  Mauri Brooklyn, P 06/29/2017, 4:54 PM

## 2017-06-29 NOTE — Progress Notes (Signed)
PT Cancellation Note  Patient Details Name: Robert Keith MRN: [id number] DOB: 1933-01-18   Cancelled Treatment:    Reason Eval/Treat Not Completed: Patient not medically ready   Duncan Dull 06/29/2017, 11:10 AM Alben Deeds, PT DPT [facility] [phone]

## 2017-06-29 NOTE — Progress Notes (Signed)
New labs drawn, CXR resulted. Sodium bicarb gtt started. HR sustaining in the 140's-160's. MD paged again. Jailah Willis, Rande Brunt, RN

## 2017-06-29 NOTE — Progress Notes (Signed)
Patient became tachypnic (40's) and tachycardic in the 140's. Versed and Fentanyl given. Lopressor already given prior for HR control. Respiratory at the bedside. Elink called and MD videod in. Blood gas drawn and STAT CXR ordered. Monitoring closely. Jaman Aro, Rande Brunt, RN

## 2017-06-29 NOTE — Progress Notes (Signed)
PULMONARY / CRITICAL CARE MEDICINE   Name: Robert Keith MRN: [id number] DOB: 03/31/1933    ADMISSION DATE:  06/26/2017 CONSULTATION DATE:  06/26/17  REFERRING MD:  Dr. Leonie Man  CHIEF COMPLAINT:  CVA  HISTORY OF PRESENT ILLNESS:   83 year old man  presented 7/11 from home with witnessed onset of right facial droop, right hemiplegia and aphasia .  CT head showed a hyperdense left middle cerebral artery and chronic small left frontal subdural (unchanged from prior in 2014).  TPA was given   CTA showed left M1 occlusion and therefore patient taken to neuro IR for mechanical thrombectomy and revascularization.    past medical history of afib (not on NOAC 2/2 GI bleed), HTN, prior SDH in 2014 that required evacuation with residual small chronic SDH), DM, CAD, tremor, H. Pylori infection, and prostate cancer         STUDIES:  7/11 Head CT >> focal hyperdensity of left distal M1 may represent thrombus, no vascular territory infarct indentified, small left frontal SDH stable in size and attenuation from 2014, ASPECTS is 10 7/11 CTA head and neck>> emergent large vessel occlusion of left M1 segment, some reconstitution of anterior left MCA branch vessels from pial collaterals  7/12 CT abd >> hematoma in the anterior RIGHT thigh measuring 5.6 x 5.3 cm  7/12 MRI >> evolved lt MCA infarct  CULTURES: none  ANTIBIOTICS: 7/11 Ancef (pre-op)  SIGNIFICANT EVENTS: 7/11  Admit  7/11 right femoral sheath found to be kinked, TPA reversal deemed to risky. Sheath pulled in PACU.  7/12 >> emergent OR for left groin hematoma , profunda femoral repaired  LINES/TUBES: 7/11 ETT >> 7/11 OGT>> 7/11 Left Radial Aline >> 7/11 Right Femoral Sheath > 7/11    SUBJECTIVE:  Norepinephrine weaned to off Slight hemoglobin drop JP drains with some blood   VITAL SIGNS: BP (!) 114/58   Pulse (!) 118   Temp 99.8 F (37.7 C) (Axillary)   Resp 17   Ht 5\' 9"  (1.753 m)   Wt 93 kg (205 lb 0.4 oz)   SpO2 100%    BMI 30.28 kg/m   HEMODYNAMICS:    VENTILATOR SETTINGS: Vent Mode: CPAP;PSV FiO2 (%):  [40 %] 40 % Set Rate:  [14 bmp] 14 bmp Vt Set:  [570 mL] 570 mL PEEP:  [5 cmH20] 5 cmH20 Pressure Support:  [5 cmH20] 5 cmH20 Plateau Pressure:  [11 cmH20-14 cmH20] 12 cmH20  INTAKE / OUTPUT: I/O last 3 completed shifts: In: 5517.8 [I.V.:4690.5; NG/GT:327.3; IV Piggyback:500] Out: 5625 [Urine:2705; Drains:1037]  PHYSICAL EXAMINATION: General: Intubated, no apparent discomfort HEENT: ET tube in place, NG tube in place Neuro: Expressive aphasia, did not follow commands today, no sedation since 7/13 CV: Regular, tachycardic to 100s, occasional PVC PULM: Clear bilaterally GI: Soft, benign, positive bowel sounds Extremities: Right thigh wound with JP drain, some blood in the drainage container Skin: No rash   LABS:  BMET  Recent Labs Lab 06/27/17 1828 06/28/17 0913 06/29/17 0400  NA 138 140 141  K 4.8 3.4* 3.4*  CL 111 111 111  CO2 19* 21* 23  BUN 19 19 16   CREATININE 1.50* 1.24 0.97  GLUCOSE 304* 185* 184*    Electrolytes  Recent Labs Lab 06/27/17 0314  06/27/17 1828 06/28/17 0913 06/29/17 0400  CALCIUM 7.4*  < > 6.8* 7.1* 7.4*  MG 1.7  --   --   --  1.7  PHOS 3.2  --   --   --  2.1*  < > =  values in this interval not displayed.  CBC  Recent Labs Lab 06/27/17 2321  06/28/17 0652 06/28/17 1520 06/29/17 0400  WBC 16.1*  --   --  11.1* 9.3  HGB 9.8*  9.9*  < > 9.0* 8.2* 7.8*  HCT 27.6*  28.2*  < > 25.1* 23.3* 21.9*  PLT 85*  --   --  76* 74*  < > = values in this interval not displayed.  Coag's  Recent Labs Lab 06/26/17 1420 06/27/17 1515 06/27/17 1828  APTT 29 31 31   INR 1.13 1.67 1.59    Sepsis Markers  Recent Labs Lab 06/27/17 1827  LATICACIDVEN 5.1*    ABG  Recent Labs Lab 06/26/17 2130 06/27/17 1533 06/27/17 1719  PHART 7.410 7.613* 7.234*  PCO2ART 37.1 34.5 45.1  PO2ART 251.0* 183.0* 474.0*    Liver Enzymes  Recent  Labs Lab 06/26/17 1420  AST 34  ALT 33  ALKPHOS 88  BILITOT 0.9  ALBUMIN 3.6    Cardiac Enzymes  Recent Labs Lab 06/27/17 1355  TROPONINI <0.03    Glucose  Recent Labs Lab 06/28/17 1219 06/28/17 1614 06/28/17 2037 06/28/17 2328 06/29/17 0353 06/29/17 0810  GLUCAP 132* 144* 177* 158* 172* 165*    CXR - ETT in position, reviewed 7/12    DISCUSSION: 45 yoM with PMH of Afib- not on coag  With Left MCA-CVA s/p neuro IR  ASSESSMENT / PLAN:  PULMONARY A: Post op resp failure, VDRF P:   Continue pressure support as he can tolerate Reassess for possible extubation on 7/15, goal improved mental status and ability to follow commands, strong cough Plan is for a one way extubation when he appears to be optimized  CARDIOVASCULAR A:  Hemorrhagic shock Afib - not on anticoagulation 2/2 previous GI bleed Hx CAD, HTN  Echo 09/2016 >> RVSP 50, mild AS P:  Norepinephrine off Metoprolol IV when necessary for rate control Consider add metoprolol per tube  RENAL A:   Hypocalcemia AKI related to hypotension, improving P:   Follow BMP, urine output Replace electrolytes as indicated  GASTROINTESTINAL A:   Nutrition Needs  P:   PPI as ordered Continue tube feeding  HEMATOLOGIC A:   Anemia- mild , chronic + acute blood loss Rt thigh hematoma s/p profunda repair, drains P:  Follow CBC every 12h  Follow right thigh drain output SCD in place    ENDOCRINE A:   DM   P:   Sliding scale insulin and CBG per protocol  NEUROLOGIC A:   Left M1 occlusion s/p TPA and mechanical thrombectomy w/revascularization  - with right hemiplegia, global aphasia and left gaze   Chronic small left frontal SDH from prior SDH in 2014 that required  Hx prior SDH in 2014 requiring craniotomy  P:   RASS goal: 0 Minimize sedating medication Follow neurological status, ability to follow commands   FAMILY  - Updates: Updated family at bedside 7/14  - Inter-disciplinary  family meet or Palliative Care meeting due by:  Done, 7/12  Independent CC time 33 minutes  Baltazar Apo, MD, PhD 06/29/2017, 9:50 AM [facility] [phone] or if no answer [phone]

## 2017-06-29 NOTE — Progress Notes (Signed)
Pharmacy Antibiotic Note  Robert Keith is a 83 y.o. male admitted on 06/26/2017 with sepsis.  Pharmacy has been consulted for ceftazidime and vancomycin dosing.  Tmax of 101.3, WBC wnl. SCr stable, CrCl ~5ml/min.  Plan: Start ceftazidime 1g IV Q8h Give vancomycin 2g IV x 1, then start vancomycin 1,250mg  IV Q12h Monitor clinical picture, renal function, VT prn F/U C&S, abx deescalation / LOT   Height: 5\' 9"  (175.3 cm) Weight: 205 lb 0.4 oz (93 kg) IBW/kg (Calculated) : 70.7  Temp (24hrs), Avg:99.9 F (37.7 C), Min:99.1 F (37.3 C), Max:101.3 F (38.5 C)   Recent Labs Lab 06/27/17 1355 06/27/17 1827 06/27/17 1828 06/27/17 2321 06/28/17 0913 06/28/17 1520 06/29/17 0400 06/29/17 1524 06/29/17 1734 06/29/17 2045  WBC 14.8*  --  11.6* 16.1*  --  11.1* 9.3 7.4  --   --   CREATININE 1.33*  --  1.50*  --  1.24  --  0.97  --  0.90  --   LATICACIDVEN  --  5.1*  --   --   --   --   --   --  2.4* 2.8*    Estimated Creatinine Clearance: 71.2 mL/min (by C-G formula based on SCr of 0.9 mg/dL).    Allergies  Allergen Reactions  . Metformin And Related Diarrhea  . Tape Other (See Comments)    PLEASE USE COBAN WRAP; PATIENT'S SKIN IS THIN AND WILL TEAR EASILY!!  . Bupropion Other (See Comments)    Unknown  . Fenofibrate Micronized Other (See Comments)    Unknown  . Gemfibrozil Other (See Comments)    Unknown  . Niacin Other (See Comments)    Impotence   . Phenobarbital Other (See Comments)    Confusion   . Simvastatin Other (See Comments)    Myalgia    Thank you for allowing pharmacy to be a part of this patient's care.  Reginia Naas 06/29/2017 11:38 PM

## 2017-06-29 NOTE — Progress Notes (Signed)
  Progress Note    06/29/2017 11:52 AM 2 Days Post-Op  Subjective:  Remains intubated  Vitals:   06/29/17 1000 06/29/17 1100  BP: (!) 104/58 (!) 97/50  Pulse: (!) 105 100  Resp: 16 16  Temp:      Physical Exam: Not following command for me this a.m Right groin dressing taken down, staples in place and incision cdi There are multiple skin tears from pressure Drains in place #175cc bloody drainage overnight, #2 70 also bloody Strong right dp signal  CBC    Component Value Date/Time   WBC 9.3 06/29/2017 0400   RBC 2.61 (L) 06/29/2017 0400   HGB 7.8 (L) 06/29/2017 0400   HGB 15.2 12/22/2014 1430   HCT 21.9 (L) 06/29/2017 0400   HCT 46.1 12/22/2014 1430   PLT 74 (L) 06/29/2017 0400   PLT 194 12/22/2014 1430   MCV 83.9 06/29/2017 0400   MCV 96 12/22/2014 1430   MCH 29.9 06/29/2017 0400   MCHC 35.6 06/29/2017 0400   RDW 15.9 (H) 06/29/2017 0400   RDW 14.0 12/22/2014 1430   LYMPHSABS 0.7 06/27/2017 0314   LYMPHSABS 1.6 12/22/2014 1430   MONOABS 0.9 06/27/2017 0314   MONOABS 1.1 (H) 12/22/2014 1430   EOSABS 0.0 06/27/2017 0314   EOSABS 0.1 12/22/2014 1430   BASOSABS 0.0 06/27/2017 0314   BASOSABS 0.1 12/22/2014 1430    BMET    Component Value Date/Time   NA 141 06/29/2017 0400   NA 142 12/22/2014 1430   K 3.4 (L) 06/29/2017 0400   K 4.3 12/22/2014 1430   CL 111 06/29/2017 0400   CL 108 (H) 12/22/2014 1430   CO2 23 06/29/2017 0400   CO2 27 12/22/2014 1430   GLUCOSE 184 (H) 06/29/2017 0400   GLUCOSE 119 (H) 12/22/2014 1430   BUN 16 06/29/2017 0400   BUN 20 (H) 12/22/2014 1430   CREATININE 0.97 06/29/2017 0400   CREATININE 1.13 12/22/2014 1430   CALCIUM 7.4 (L) 06/29/2017 0400   CALCIUM 8.8 12/22/2014 1430   GFRNONAA >60 06/29/2017 0400   GFRNONAA >60 12/22/2014 1430   GFRAA >60 06/29/2017 0400   GFRAA >60 12/22/2014 1430    INR    Component Value Date/Time   INR 1.59 06/27/2017 1828     Intake/Output Summary (Last 24 hours) at 06/29/17  1152 Last data filed at 06/29/17 1100  Gross per 24 hour  Intake          2349.71 ml  Output             2472 ml  Net          -122.29 ml     Assessment:  83 y.o. male is s/p right groin exploration and primary arterial repair with drain placement.   Plan: Continue drains to suction Dry dressing to right groin incision and non-stick gauze to skin tears   Corban Kistler C. Donzetta Matters, MD [facility] Office: [phone] Pager: [phone]  06/29/2017 11:52 AM

## 2017-06-29 NOTE — Progress Notes (Signed)
STROKE TEAM PROGRESS NOTE   SUBJECTIVE (INTERVAL HISTORY) His family members are at the bedside. Pt more awake alert, repeat CT showed no midline shift. Off sedation and off pressors. BP stable still has afib RVR. Hb drop from 8.2 to 7.8, WBC normalized. Still has right groin drainage from JP drain.    OBJECTIVE Temp:  [99.1 F (37.3 C)-99.9 F (37.7 C)] 99.8 F (37.7 C) (07/14 0800) Pulse Rate:  [52-137] 118 (07/14 0800) Cardiac Rhythm: Atrial fibrillation (07/14 0752) Resp:  [13-22] 17 (07/14 0800) BP: (91-138)/(44-84) 114/58 (07/14 0800) SpO2:  [97 %-100 %] 100 % (07/14 0800) Arterial Line BP: (92-161)/(42-83) 135/54 (07/14 0800) FiO2 (%):  [40 %] 40 % (07/14 0723) Weight:  [93 kg (205 lb 0.4 oz)] 93 kg (205 lb 0.4 oz) (07/14 0321)  CBC:  Recent Labs Lab 06/26/17 1420  06/27/17 0314  06/28/17 1520 06/29/17 0400  WBC 5.9  --  11.1*  < > 11.1* 9.3  NEUTROABS 3.1  --  9.4*  --   --   --   HGB 12.3*  < > 8.8*  < > 8.2* 7.8*  HCT 37.9*  < > 26.9*  < > 23.3* 21.9*  MCV 92.2  --  92.1  < > 82.9 83.9  PLT 195  --  153  < > 76* 74*  < > = values in this interval not displayed.  Basic Metabolic Panel:  Recent Labs Lab 06/27/17 0314  06/28/17 0913 06/29/17 0400  NA 139  < > 140 141  K 3.9  < > 3.4* 3.4*  CL 114*  < > 111 111  CO2 20*  < > 21* 23  GLUCOSE 165*  < > 185* 184*  BUN 16  < > 19 16  CREATININE 0.89  < > 1.24 0.97  CALCIUM 7.4*  < > 7.1* 7.4*  MG 1.7  --   --  1.7  PHOS 3.2  --   --  2.1*  < > = values in this interval not displayed.  Lipid Panel:     Component Value Date/Time   CHOL 115 06/27/2017 0500   TRIG 86 06/27/2017 0500   HDL 20 (L) 06/27/2017 0500   CHOLHDL 5.8 06/27/2017 0500   VLDL 17 06/27/2017 0500   LDLCALC 78 06/27/2017 0500   HgbA1c:  Lab Results  Component Value Date   HGBA1C 6.8 (H) 06/27/2017   Urine Drug Screen: No results found for: LABOPIA, COCAINSCRNUR, LABBENZ, AMPHETMU, THCU, LABBARB  Alcohol Level No results found for:  Rosine I have personally reviewed the radiological images below and agree with the radiology interpretations.  Ct Abdomen Pelvis Wo Contrast 06/27/2017 1. Hematoma within the proximal RIGHT anterior thigh is incompletely imaged. Hematoma likely spontaneous related to anticoagulation. Recommend clinical correlation with extent of hematoma in the RIGHT thigh.  2. No intraperitoneal or retroperitoneal hemorrhage.  3. Extensive diverticulosis without diverticulitis.  4.  Aortic Atherosclerosis (ICD10-I70.0).  5. Small bilateral pleural effusions.    Ct Angio Head and neck W and Wo Contrast 06/26/2017 1. Emergent large vessel occlusion of the left M1 segment.  2. There is some reconstitution of anterior left MCA branch vessels from pial collaterals.  3. Dense calcifications at the carotid bifurcations bilaterally without significant stenoses relative to the more distal vessels.  4. Moderate narrowing of the proximal posterior cerebral arteries bilaterally, right greater than left.  5. Calcifications at great vessel origins along the arch without significant stenosis. 6. Hypoplastic left vertebral artery.  7. Multilevel cervical spinal spondylosis.  8. Emphysema.  9. Prior granulomatous disease in the right upper lobe.  Cerebral aneurysm and thrombectomy - Dr. Estanislado Pandy 06/26/2017 Status post endovascular complete revascularization of occluded left middle cerebral artery with 1 pass with Solitaire 4 mm x 40 mm FR retrieval device and a 6 [place] intermediary guide catheter achieving a TICI 3 reperfusion.  Mr Brain Wo Contrast 06/27/2017 Completed acute infarction affecting the majority of the inferior division left MCA distribution. This affects the left temporal lobe and posterior frontal lobe with patchy involvement in the parietal region.  Infarction of the caudate and putamen as well.  Petechial blood product deposition without focal hematoma.  Swelling but no shift at this  time.  Ct Cerebral Perfusion W Contrast 06/26/2017 Left M1 emergent large vessel occlusion with associated acute left MCA territory infarct.  Core infarct is estimated at 49 mL.  The tissue at risk is estimated at 182 mL.   Ct Head Code Stroke W/o Cm 06/26/2017 1. Focal hyperdensity in left distal M1 may represent thrombus.  2. No vascular territory infarct identified at this time.  3. Small left frontal subdural hematoma is stable in size and attenuation from 2014.  4. ASPECTS is 10   06/29/2017 1. Evolving large LEFT MCA territory infarct (LEFT basal ganglia, LEFT temporoparietal lobes) with petechial hemorrhage, no hemorrhagic conversion.  2. Acute small volume LEFT cerebrum extra-axial blood products.  3. Chronic changes include old small LEFT frontal subdural hematoma and small LEFT frontal encephalomalacia.    Transthoracic echocardiogram 06/28/2017 Study Conclusions - Left ventricle: The cavity size was normal. There was mild   concentric hypertrophy. Systolic function was normal. The    estimated ejection fraction was in the range of 50% to 55%.   Hypokinesis of the apical inferior and akinesis of the basal to   mid inferior myocardium. Doppler parameters are consistent with   indeterminate ventricular filling pressure. - Aortic valve: Valve mobility was restricted. There was mild   stenosis. There was mild regurgitation. Peak velocity (S): 300   cm/s. Mean gradient (S): 19 mm Hg. Valve area (VTI): 1.64 cm^2.   Valve area (Vmax): 1.49 cm^2. Valve area (Vmean): 1.39 cm^2.   Regurgitation pressure half-time: 723 ms. - Mitral valve: Transvalvular velocity was within the normal range.   There was no evidence for stenosis. There was no regurgitation. - Left atrium: The atrium was severely dilated. - Right ventricle: The cavity size was normal. Wall thickness was   normal. Systolic function was normal. - Tricuspid valve: There was mild regurgitation. - Pulmonary arteries:  Systolic pressure was within the normal   range. PA peak pressure: 27 mm Hg (S)   PHYSICAL EXAM  Temp:  [99.1 F (37.3 C)-99.9 F (37.7 C)] 99.8 F (37.7 C) (07/14 0800) Pulse Rate:  [52-137] 103 (07/14 0900) Resp:  [13-22] 16 (07/14 0900) BP: (91-138)/(44-84) 117/55 (07/14 0900) SpO2:  [97 %-100 %] 100 % (07/14 0900) Arterial Line BP: (92-161)/(42-83) 144/57 (07/14 0900) FiO2 (%):  [40 %] 40 % (07/14 0723) Weight:  [205 lb 0.4 oz (93 kg)] 205 lb 0.4 oz (93 kg) (07/14 0321)  General - Well nourished, well developed, intubated off sedation and pressor.  Ophthalmologic - Fundi not visualized due to noncooperation.  Cardiovascular - irregularly irregular heart rate and rhythm with RVR.  Extremities - right groin JP drain, would dress dry and clean.   Neuro - intubated off sedation and pressor, awake alert, not following commands, eye left gaze  preference, barely cross midline, blinking to visual threat on the left but not on the right. PERRL. Facial symmetry not able to test due to ET tube. Spontaneous movement of LUE and LLE, at least 3/5 LUE and 2/5 LLE. Hemiplegic on the right, only trace withdraw on pain stimulation. DTR 1+ and right positive babinski. Sensation, coordination and gait not tested.    ASSESSMENT/PLAN Mr. BILLYE NYDAM is a 83 y.o. male with history of prostate cancer, CAD, history of GI bleed, DM2, essential tremor, hypertension, history of atrial fibrillation and subdural hematoma, s/p craniectomy and clot evacuation who presented with right facial droop, dense right hemiplegia global aphasia, and left gaze deviation. He recevied tPA at 1507 on 06/26/2017.  CTA Occluded left M1 -> IR thrombectomy TICI 3 reperfusion with Dr. Estanislado Pandy.  Stroke: left MCA infarct due to left M1 occlusion due to afib not on AC  Resultant  aphasia and right hemiplegia  CT head:  Focal hyperdensity in left distal M1 may represent thrombus. Small left frontal subdural hematoma is stable  in size and attenuation from 2014.   MRI head: left MCA infarct. Petechial blood product deposition without focal hematoma.  CT Angio Head: Emergent large vessel occlusion of the left M1 segment. Dense calcifications at the carotid bifurcations bilaterally without significant stenoses  2D Echo - EF 50-55%. Left atrium severely dilated. No cardiac source of emboli identified.  LDL 78  HgbA1c 6.8  SCDs for VTE prophylaxis Diet NPO time specified  No antithrombotic prior to admission, now on No antithrombotic. Pt not on AC at baseline due to previous SDH s/p crani and evacuation. Currently not candidate for [facility] due to large size of stroke, large groin hematoma s/p evacuation and dropping H&H.  Patient counseled to be compliant with his antithrombotic medications  Ongoing aggressive stroke risk factor management  Therapy recommendations:  pending  Disposition:   pending  Right femoral pseudoaneurysm with massive thigh hematoma s/p IR due to PFA tear   VVS on board  requiring surgical suturing and clot evacuation  JP drain  Respiratory failure  Intubated but doing well on weaning trial  CCM on board  Extubate as able  Afib RVR not on [facility]  Followed with cardiology  Not AC candidate due to previous SDH s/p crani and evacuation  Currently not candidate due to large size of stroke, large groin hematoma s/p evacuation and anemia.  On metoprolol for RVR   Hyperlipidemia  Home meds: pravastatin 40mg  PO daily  LDL 78, goal < 70  Statin resumed  Continue statin at discharge  Diabetes  HgbA1c 6.8, goal < 7.0  Controlled  SSI  Anemia  Due to acute blood loss  Hb 9.0->8.2->7.8  Close monitoring  PRBC transfusion if needed  Other Stroke Risk Factors  Advanced age  Overweight, Body mass index is 30.28 kg/m., recommend weight loss, diet and exercise as appropriate   Family hx stroke (Mother)  Coronary artery disease  Other Active  Problems  Thrombocytopenia - 74 K  Mild hypokalemia - 3.4  Hypophosphatemia - 2.1  GI bleed history   Hospital day # 3  This patient is critically ill due to large left MCA infarct s/p IR, right groin hematoma s/p evacuation, afib RVR, anemia, respiratory failure and at significant risk of neurological worsening, death form recurrent stroke, hemorrhagic transformation, shock, heart failure, aspiration pneumonia. This patient's care requires constant monitoring of vital signs, hemodynamics, respiratory and cardiac monitoring, review of multiple databases, neurological assessment, discussion with family,  other specialists and medical decision making of high complexity. I spent 45 minutes of neurocritical care time in the care of this patient. I had long discussion with family members at bedside, updated pt current condition, treatment plan and potential prognosis. They expressed understanding and appreciation.   Rosalin Hawking, MD PhD Stroke Neurology 06/29/2017 8:03 PM  To contact Stroke Continuity provider, please refer to [email or web address] After hours, contact General Neurology

## 2017-06-29 NOTE — Progress Notes (Signed)
SLP Cancellation Note  Patient Details Name: LEVESTER WALDRIDGE MRN: [id number] DOB: 1933-07-10   Cancelled treatment:       Reason Eval/Treat Not Completed: Medical issues which prohibited therapy   Elvina Sidle, M.S., CCC-SLP 06/29/2017, 1:55 PM

## 2017-06-30 DIAGNOSIS — D62 Acute posthemorrhagic anemia: Secondary | ICD-10-CM

## 2017-06-30 LAB — BLOOD CULTURE ID PANEL (REFLEXED)
ACINETOBACTER BAUMANNII: NOT DETECTED
CANDIDA ALBICANS: NOT DETECTED
CANDIDA GLABRATA: NOT DETECTED
CANDIDA TROPICALIS: NOT DETECTED
Candida krusei: NOT DETECTED
Candida parapsilosis: NOT DETECTED
ENTEROBACTER CLOACAE COMPLEX: NOT DETECTED
ENTEROBACTERIACEAE SPECIES: NOT DETECTED
ENTEROCOCCUS SPECIES: NOT DETECTED
Escherichia coli: NOT DETECTED
HAEMOPHILUS INFLUENZAE: NOT DETECTED
Klebsiella oxytoca: NOT DETECTED
Klebsiella pneumoniae: NOT DETECTED
LISTERIA MONOCYTOGENES: NOT DETECTED
METHICILLIN RESISTANCE: NOT DETECTED
NEISSERIA MENINGITIDIS: NOT DETECTED
PSEUDOMONAS AERUGINOSA: NOT DETECTED
Proteus species: NOT DETECTED
STREPTOCOCCUS AGALACTIAE: NOT DETECTED
STREPTOCOCCUS PNEUMONIAE: NOT DETECTED
STREPTOCOCCUS PYOGENES: NOT DETECTED
STREPTOCOCCUS SPECIES: NOT DETECTED
Serratia marcescens: NOT DETECTED
Staphylococcus aureus (BCID): NOT DETECTED
Staphylococcus species: DETECTED — AB

## 2017-06-30 LAB — BLOOD GAS, ARTERIAL
ACID-BASE DEFICIT: 1.4 mmol/L (ref 0.0–2.0)
Bicarbonate: 21.7 mmol/L (ref 20.0–28.0)
DRAWN BY: 317771
FIO2: 0.4
O2 SAT: 94.4 %
PCO2 ART: 29.9 mmHg — AB (ref 32.0–48.0)
PEEP/CPAP: 5 cmH2O
PH ART: 7.475 — AB (ref 7.350–7.450)
Patient temperature: 98.6
RATE: 14 resp/min
VT: 570 mL
pO2, Arterial: 65.2 mmHg — ABNORMAL LOW (ref 83.0–108.0)

## 2017-06-30 LAB — URINALYSIS, ROUTINE W REFLEX MICROSCOPIC
BILIRUBIN URINE: NEGATIVE
Bacteria, UA: NONE SEEN
Ketones, ur: NEGATIVE mg/dL
LEUKOCYTES UA: NEGATIVE
NITRITE: NEGATIVE
Protein, ur: 30 mg/dL — AB
RBC / HPF: NONE SEEN RBC/hpf (ref 0–5)
SPECIFIC GRAVITY, URINE: 1.021 (ref 1.005–1.030)
Squamous Epithelial / LPF: NONE SEEN
pH: 5 (ref 5.0–8.0)

## 2017-06-30 LAB — BASIC METABOLIC PANEL
Anion gap: 8 (ref 5–15)
BUN: 17 mg/dL (ref 6–20)
CHLORIDE: 111 mmol/L (ref 101–111)
CO2: 22 mmol/L (ref 22–32)
Calcium: 7.3 mg/dL — ABNORMAL LOW (ref 8.9–10.3)
Creatinine, Ser: 1.04 mg/dL (ref 0.61–1.24)
GFR calc Af Amer: >60 mL/min (ref >60)
GFR calc non Af Amer: >60 mL/min (ref >60)
GLUCOSE: 218 mg/dL — AB (ref 65–99)
POTASSIUM: 3.3 mmol/L — AB (ref 3.5–5.1)
Sodium: 141 mmol/L (ref 135–145)

## 2017-06-30 LAB — CBC
HEMATOCRIT: 23.2 % — AB (ref 39.0–52.0)
HEMOGLOBIN: 7.9 g/dL — AB (ref 13.0–17.0)
MCH: 29.6 pg (ref 26.0–34.0)
MCHC: 34.1 g/dL (ref 30.0–36.0)
MCV: 86.9 fL (ref 78.0–100.0)
Platelets: 96 10*3/uL — ABNORMAL LOW (ref 150–400)
RBC: 2.67 MIL/uL — AB (ref 4.22–5.81)
RDW: 16.2 % — ABNORMAL HIGH (ref 11.5–15.5)
WBC: 13.1 10*3/uL — ABNORMAL HIGH (ref 4.0–10.5)

## 2017-06-30 LAB — GLUCOSE, CAPILLARY
GLUCOSE-CAPILLARY: 155 mg/dL — AB (ref 65–99)
GLUCOSE-CAPILLARY: 144 mg/dL — AB (ref 65–99)
Glucose-Capillary: 205 mg/dL — ABNORMAL HIGH (ref 65–99)
Glucose-Capillary: 158 mg/dL — ABNORMAL HIGH (ref 65–99)
Glucose-Capillary: 153 mg/dL — ABNORMAL HIGH (ref 65–99)
Glucose-Capillary: 161 mg/dL — ABNORMAL HIGH (ref 65–99)

## 2017-06-30 LAB — PROCALCITONIN
PROCALCITONIN: 5.57 ng/mL
PROCALCITONIN: 6.3 ng/mL

## 2017-06-30 LAB — LACTIC ACID, PLASMA: LACTIC ACID, VENOUS: 3.1 mmol/L — AB (ref 0.5–1.9)

## 2017-06-30 MED ORDER — POTASSIUM CHLORIDE 20 MEQ/15ML (10%) PO SOLN
20.0000 meq | ORAL | Status: AC
  Administered 2017-06-30 (×2): 20 meq
  Filled 2017-06-30 (×2): qty 15

## 2017-06-30 MED ORDER — FUROSEMIDE 10 MG/ML IJ SOLN
40.0000 mg | Freq: Once | INTRAMUSCULAR | Status: AC
  Administered 2017-06-30: 40 mg via INTRAVENOUS
  Filled 2017-06-30: qty 4

## 2017-06-30 MED ORDER — METOPROLOL TARTRATE 25 MG PO TABS
25.0000 mg | ORAL_TABLET | Freq: Two times a day (BID) | ORAL | Status: DC
  Administered 2017-06-30 – 2017-07-01 (×3): 25 mg via ORAL
  Filled 2017-06-30 (×3): qty 1

## 2017-06-30 NOTE — Progress Notes (Signed)
  Progress Note    06/30/2017 10:22 AM 3 Days Post-Op  Subjective: stable overnigh  Vitals:   06/30/17 0945 06/30/17 1000  BP: 133/72 109/82  Pulse: (!) 111 62  Resp: (!) 22 (!) 21  Temp:      Physical Exam: intubated Right groin dressing is cid Drains in place #1 115cc bloody drainage overnight, #2 30 thin Strong right dp signal, palpable dp on left  CBC    Component Value Date/Time   WBC 13.1 (H) 06/30/2017 0445   RBC 2.67 (L) 06/30/2017 0445   HGB 7.9 (L) 06/30/2017 0445   HGB 15.2 12/22/2014 1430   HCT 23.2 (L) 06/30/2017 0445   HCT 46.1 12/22/2014 1430   PLT 96 (L) 06/30/2017 0445   PLT 194 12/22/2014 1430   MCV 86.9 06/30/2017 0445   MCV 96 12/22/2014 1430   MCH 29.6 06/30/2017 0445   MCHC 34.1 06/30/2017 0445   RDW 16.2 (H) 06/30/2017 0445   RDW 14.0 12/22/2014 1430   LYMPHSABS 0.7 06/27/2017 0314   LYMPHSABS 1.6 12/22/2014 1430   MONOABS 0.9 06/27/2017 0314   MONOABS 1.1 (H) 12/22/2014 1430   EOSABS 0.0 06/27/2017 0314   EOSABS 0.1 12/22/2014 1430   BASOSABS 0.0 06/27/2017 0314   BASOSABS 0.1 12/22/2014 1430    BMET    Component Value Date/Time   NA 141 06/30/2017 0445   NA 142 12/22/2014 1430   K 3.3 (L) 06/30/2017 0445   K 4.3 12/22/2014 1430   CL 111 06/30/2017 0445   CL 108 (H) 12/22/2014 1430   CO2 22 06/30/2017 0445   CO2 27 12/22/2014 1430   GLUCOSE 218 (H) 06/30/2017 0445   GLUCOSE 119 (H) 12/22/2014 1430   BUN 17 06/30/2017 0445   BUN 20 (H) 12/22/2014 1430   CREATININE 1.04 06/30/2017 0445   CREATININE 1.13 12/22/2014 1430   CALCIUM 7.3 (L) 06/30/2017 0445   CALCIUM 8.8 12/22/2014 1430   GFRNONAA >60 06/30/2017 0445   GFRNONAA >60 12/22/2014 1430   GFRAA >60 06/30/2017 0445   GFRAA >60 12/22/2014 1430    INR    Component Value Date/Time   INR 1.59 06/27/2017 1828     Intake/Output Summary (Last 24 hours) at 06/30/17 1022 Last data filed at 06/30/17 0900  Gross per 24 hour  Intake           2577.5 ml  Output              2045 ml  Net            532.5 ml    Assessment:  83 y.o. male is s/p right groin exploration and primary arterial repair with drain placement.   Plan: Continue drains to suction Dry dressing to right groin, I have asked nurse to change this p.m.     Estuardo Frisbee C. Donzetta Matters, MD [facility] Office: [phone] Pager: [phone]  06/30/2017 10:22 AM

## 2017-06-30 NOTE — Progress Notes (Signed)
PHARMACY - PHYSICIAN COMMUNICATION CRITICAL VALUE ALERT - BLOOD CULTURE IDENTIFICATION (BCID)  Results for orders placed or performed during the hospital encounter of 06/26/17  Blood Culture ID Panel (Reflexed) (Collected: 06/30/2017 12:08 AM)  Result Value Ref Range   Enterococcus species NOT DETECTED NOT DETECTED   Listeria monocytogenes NOT DETECTED NOT DETECTED   Staphylococcus species DETECTED (A) NOT DETECTED   Staphylococcus aureus NOT DETECTED NOT DETECTED   Methicillin resistance NOT DETECTED NOT DETECTED   Streptococcus species NOT DETECTED NOT DETECTED   Streptococcus agalactiae NOT DETECTED NOT DETECTED   Streptococcus pneumoniae NOT DETECTED NOT DETECTED   Streptococcus pyogenes NOT DETECTED NOT DETECTED   Acinetobacter baumannii NOT DETECTED NOT DETECTED   Enterobacteriaceae species NOT DETECTED NOT DETECTED   Enterobacter cloacae complex NOT DETECTED NOT DETECTED   Escherichia coli NOT DETECTED NOT DETECTED   Klebsiella oxytoca NOT DETECTED NOT DETECTED   Klebsiella pneumoniae NOT DETECTED NOT DETECTED   Proteus species NOT DETECTED NOT DETECTED   Serratia marcescens NOT DETECTED NOT DETECTED   Haemophilus influenzae NOT DETECTED NOT DETECTED   Neisseria meningitidis NOT DETECTED NOT DETECTED   Pseudomonas aeruginosa NOT DETECTED NOT DETECTED   Candida albicans NOT DETECTED NOT DETECTED   Candida glabrata NOT DETECTED NOT DETECTED   Candida krusei NOT DETECTED NOT DETECTED   Candida parapsilosis NOT DETECTED NOT DETECTED   Candida tropicalis NOT DETECTED NOT DETECTED    Name of physician (or Provider) Contacted: Dr. Tamala Julian  Changes to prescribed antibiotics required: Patient on vancomycin.   Leroy Libman, PharmD Pharmacy Resident 06/30/2017  9:23 PM

## 2017-06-30 NOTE — Progress Notes (Signed)
PULMONARY / CRITICAL CARE MEDICINE   Name: MARTON MALIZIA MRN: [id number] DOB: 1933-05-28    ADMISSION DATE:  06/26/2017 CONSULTATION DATE:  06/26/17  REFERRING MD:  Dr. Leonie Man  CHIEF COMPLAINT:  CVA  HISTORY OF PRESENT ILLNESS:   83 year old man  presented 7/11 from home with witnessed onset of right facial droop, right hemiplegia and aphasia .  CT head showed a hyperdense left middle cerebral artery and chronic small left frontal subdural (unchanged from prior in 2014).  TPA was given   CTA showed left M1 occlusion and therefore patient taken to neuro IR for mechanical thrombectomy and revascularization.   past medical history of afib (not on NOAC 2/2 GI bleed), HTN, prior SDH in 2014 that required evacuation with residual small chronic SDH), DM, CAD, tremor, H. Pylori infection, and prostate cancer         STUDIES:  7/11 Head CT >> focal hyperdensity of left distal M1 may represent thrombus, no vascular territory infarct indentified, small left frontal SDH stable in size and attenuation from 2014, ASPECTS is 10 7/11 CTA head and neck>> emergent large vessel occlusion of left M1 segment, some reconstitution of anterior left MCA branch vessels from pial collaterals  7/12 CT abd >> hematoma in the anterior RIGHT thigh measuring 5.6 x 5.3 cm  7/12 MRI >> evolved lt MCA infarct  CULTURES: Respiratory 7/15 >>  Urine 7/15 >>  Blood 7/15 >>   ANTIBIOTICS: 7/11 Ancef (pre-op) ceftaz (empiric) 7/14 >>  vanco (empiric) 7/14 >>   SIGNIFICANT EVENTS: 7/11  Admit  7/11 right femoral sheath found to be kinked, TPA reversal deemed to risky. Sheath pulled in PACU.  7/12 >> emergent OR for left groin hematoma , profunda femoral repaired  LINES/TUBES: 7/11 ETT >> 7/11 OGT>> 7/11 Left Radial Aline >> 7/15 7/11 Right Femoral Sheath > 7/11    SUBJECTIVE:  Pt experienced T 101.3 yesterday evening, developed recurrent hypotension and phenylephrine started.  Empiric abx started as  below Received bicarb gtt x 1 bag, now off CXR 7/14 pm > B infiltrates    VITAL SIGNS: BP 109/82   Pulse 62   Temp 99 F (37.2 C) (Axillary)   Resp (!) 21   Ht 5\' 9"  (1.753 m)   Wt 93.9 kg (207 lb 0.2 oz)   SpO2 100%   BMI 30.57 kg/m   HEMODYNAMICS:    VENTILATOR SETTINGS: Vent Mode: PRVC FiO2 (%):  [40 %] 40 % Set Rate:  [14 bmp] 14 bmp Vt Set:  [570 mL] 570 mL PEEP:  [5 cmH20] 5 cmH20 Pressure Support:  [5 cmH20] 5 cmH20 Plateau Pressure:  [12 cmH20-19 cmH20] 18 cmH20  INTAKE / OUTPUT: I/O last 3 completed shifts: In: 3802.5 [I.V.:3032.5; NG/GT:720; IV Piggyback:50] Out: 62 [Urine:2500; Drains:775]  PHYSICAL EXAMINATION: General: Elderly man, intubated, no distress HEENT: ETT in place, no oral lesions Neuro: Able to follow commands, tracks, moderate cough CV: Regular, occasional PACs, rate 90s PULM: Decreased bilateral bases GI: Soft, benign, positive bowel sounds Extremities: JP drain remains in right thigh wound with some bloody drainage Skin: No rash   LABS:  BMET  Recent Labs Lab 06/29/17 0400 06/29/17 1734 06/30/17 0445  NA 141 142 141  K 3.4* 3.4* 3.3*  CL 111 115* 111  CO2 23 21* 22  BUN 16 15 17   CREATININE 0.97 0.90 1.04  GLUCOSE 184* 186* 218*    Electrolytes  Recent Labs Lab 06/27/17 0314  06/29/17 0400 06/29/17 1734 06/30/17 0445  CALCIUM 7.4*  < > 7.4* 7.4* 7.3*  MG 1.7  --  1.7  --   --   PHOS 3.2  --  2.1*  --   --   < > = values in this interval not displayed.  CBC  Recent Labs Lab 06/29/17 0400 06/29/17 1524 06/30/17 0445  WBC 9.3 7.4 13.1*  HGB 7.8* 8.4* 7.9*  HCT 21.9* 24.4* 23.2*  PLT 74* 87* 96*    Coag's  Recent Labs Lab 06/26/17 1420 06/27/17 1515 06/27/17 1828  APTT 29 31 31   INR 1.13 1.67 1.59    Sepsis Markers  Recent Labs Lab 06/29/17 1734 06/29/17 2045 06/30/17 0005 06/30/17 0445  LATICACIDVEN 2.4* 2.8*  --  3.1*  PROCALCITON  --   --  5.57 6.30    ABG  Recent Labs Lab  06/27/17 1719 06/29/17 1701 06/30/17 0010  PHART 7.234* 7.423 7.475*  PCO2ART 45.1 25.2* 29.9*  PO2ART 474.0* 54.0* 65.2*    Liver Enzymes  Recent Labs Lab 06/26/17 1420  AST 34  ALT 33  ALKPHOS 88  BILITOT 0.9  ALBUMIN 3.6    Cardiac Enzymes  Recent Labs Lab 06/27/17 1355  TROPONINI <0.03    Glucose  Recent Labs Lab 06/29/17 1226 06/29/17 1552 06/29/17 2000 06/29/17 2355 06/30/17 0352 06/30/17 0802  GLUCAP 164* 162* 160* 213* 205* 158*    CXR - ETT in position, reviewed 7/12    DISCUSSION: 62 yoM with PMH of Afib- not on coag  With Left MCA-CVA s/p neuro IR  ASSESSMENT / PLAN:  PULMONARY A: Post op resp failure, VDRF B infiltrate 7/14, ? Edema v TRALI, consider PNA esp w fever 7/14 pm P:   Tolerating PSV and more awake 7/15. Closer to extubation. Some concern given his recurrent pressure need overnight, increased perihilar infiltrates on chest x-ray 7/14 following multiple transfusions Consider diuresis 1 on 7/15 Empiric abx for possible HCAP started 7/14 Obtain resp cx data if possible Possible one we extubation on 7/15 although may be best to ensure that pulm infiltrates do not progress  CARDIOVASCULAR A:  Hemorrhagic shock, consider also component septic shock Afib - not on anticoagulation 2/2 previous GI bleed Hx CAD, HTN  Echo 09/2016 >> RVSP 50, mild AS P:  Phenylephrine started overnight, goal off as able Metoprolol IV as needed for rate control Decrease metoprolol per tube to 25 mg twice a day and hold for hypotension  RENAL A:   Hypocalcemia Hypokalemia AKI related to hypotension, improving P:   Follow BMP, urine output Replace electrolytes as indicated  GASTROINTESTINAL A:   Nutrition Needs  P:   PPI as ordered Tube feeding  HEMATOLOGIC A:   Anemia- mild , chronic + acute blood loss Rt thigh hematoma s/p profunda repair, drains P:  Follow CBC every 12 hours Follow right thigh drain output SCD in  place  Follow CBC every 12h  Follow right thigh drain output SCD in place  INFECTIOUS:  A: Bilateral pulmonary infiltrates, consider HCAP P:  Empiric ceftaz edema and vancomycin added on 7/14  ENDOCRINE A:   DM   P:   Sliding scale insulin and CABG per protocol  NEUROLOGIC A:   Left M1 occlusion s/p TPA and mechanical thrombectomy w/revascularization  - with right hemiplegia, global aphasia and left gaze   Chronic small left frontal SDH from prior SDH in 2014 that required  Hx prior SDH in 2014 requiring craniotomy  P:   RASS goal: 0 Continue to minimize sedating medications  as able Follow neurological status, ability to follow commands   FAMILY  - Updates: Updated family at bedside 7/14 and 7/15  - Inter-disciplinary family meet or Palliative Care meeting due by:  Done, 7/12  Independent CC time 35 minutes  Baltazar Apo, MD, PhD 06/30/2017, 10:30 AM [facility] [phone] or if no answer [phone]

## 2017-06-30 NOTE — Progress Notes (Signed)
[facility] ADULT ICU REPLACEMENT PROTOCOL FOR AM LAB REPLACEMENT ONLY  The patient does apply for the [facility] Adult ICU Electrolyte Replacment Protocol based on the criteria listed below:   1. Is GFR >/= 40 ml/min? Yes.    Patient's GFR today is >60 2. Is urine output >/= 0.5 ml/kg/hr for the last 6 hours? Yes.   Patient's UOP is 0.6 ml/kg/hr 3. Is BUN < 60 mg/dL? Yes.    Patient's BUN today is 17 4. Abnormal electrolyte(s):K3.3 5. Ordered repletion with: Per protocol 6. If a panic level lab has been reported, has the CCM MD in charge been notified? Yes.  .   Physician:  Ashok Cordia MD  [facility] 06/30/2017 6:14 AM

## 2017-06-30 NOTE — Progress Notes (Signed)
PT Cancellation Note  Patient Details Name: Robert Keith MRN: [id number] DOB: Oct 15, 1933   Cancelled Treatment:    Reason Eval/Treat Not Completed: Patient not medically ready (please reconsult when appropriate)   Duncan Dull 06/30/2017, 1:43 PM

## 2017-06-30 NOTE — Progress Notes (Addendum)
STROKE TEAM PROGRESS NOTE   SUBJECTIVE (INTERVAL HISTORY) His family members are at the bedside. Pt still intubated, has been off sedation but put back on pressor due to low BP. Still has afib RVR. Hb stable at 7.9. Still has right groin drainage from JP drain.    OBJECTIVE Temp:  [98.4 F (36.9 C)-101.3 F (38.5 C)] 98.4 F (36.9 C) (07/15 0400) Pulse Rate:  [40-125] 80 (07/15 0700) Cardiac Rhythm: Atrial fibrillation (07/15 0400) Resp:  [16-32] 21 (07/15 0700) BP: (87-133)/(44-81) 110/71 (07/15 0700) SpO2:  [91 %-100 %] 99 % (07/15 0700) Arterial Line BP: (78-169)/(39-67) 138/67 (07/15 0545) FiO2 (%):  [40 %] 40 % (07/15 0400) Weight:  [93.9 kg (207 lb 0.2 oz)] 93.9 kg (207 lb 0.2 oz) (07/15 0457)  CBC:  Recent Labs Lab 06/26/17 1420  06/27/17 0314  06/29/17 1524 06/30/17 0445  WBC 5.9  --  11.1*  < > 7.4 13.1*  NEUTROABS 3.1  --  9.4*  --   --   --   HGB 12.3*  < > 8.8*  < > 8.4* 7.9*  HCT 37.9*  < > 26.9*  < > 24.4* 23.2*  MCV 92.2  --  92.1  < > 86.2 86.9  PLT 195  --  153  < > 87* 96*  < > = values in this interval not displayed.  Basic Metabolic Panel:  Recent Labs Lab 06/27/17 0314  06/29/17 0400 06/29/17 1734 06/30/17 0445  NA 139  < > 141 142 141  K 3.9  < > 3.4* 3.4* 3.3*  CL 114*  < > 111 115* 111  CO2 20*  < > 23 21* 22  GLUCOSE 165*  < > 184* 186* 218*  BUN 16  < > 16 15 17   CREATININE 0.89  < > 0.97 0.90 1.04  CALCIUM 7.4*  < > 7.4* 7.4* 7.3*  MG 1.7  --  1.7  --   --   PHOS 3.2  --  2.1*  --   --   < > = values in this interval not displayed.  Lipid Panel:     Component Value Date/Time   CHOL 115 06/27/2017 0500   TRIG 86 06/27/2017 0500   HDL 20 (L) 06/27/2017 0500   CHOLHDL 5.8 06/27/2017 0500   VLDL 17 06/27/2017 0500   LDLCALC 78 06/27/2017 0500   HgbA1c:  Lab Results  Component Value Date   HGBA1C 6.8 (H) 06/27/2017   Urine Drug Screen: No results found for: LABOPIA, COCAINSCRNUR, LABBENZ, AMPHETMU, THCU, LABBARB  Alcohol  Level No results found for: [place] I have personally reviewed the radiological images below and agree with the radiology interpretations.  Ct Abdomen Pelvis Wo Contrast 06/27/2017 1. Hematoma within the proximal RIGHT anterior thigh is incompletely imaged. Hematoma likely spontaneous related to anticoagulation. Recommend clinical correlation with extent of hematoma in the RIGHT thigh.  2. No intraperitoneal or retroperitoneal hemorrhage.  3. Extensive diverticulosis without diverticulitis.  4.  Aortic Atherosclerosis (ICD10-I70.0).  5. Small bilateral pleural effusions.    Ct Angio Head and neck W and Wo Contrast 06/26/2017 1. Emergent large vessel occlusion of the left M1 segment.  2. There is some reconstitution of anterior left MCA branch vessels from pial collaterals.  3. Dense calcifications at the carotid bifurcations bilaterally without significant stenoses relative to the more distal vessels.  4. Moderate narrowing of the proximal posterior cerebral arteries bilaterally, right greater than left.  5. Calcifications at great vessel origins  along the arch without significant stenosis. 6. Hypoplastic left vertebral artery.  7. Multilevel cervical spinal spondylosis.  8. Emphysema.  9. Prior granulomatous disease in the right upper lobe.  Cerebral aneurysm and thrombectomy - Dr. Estanislado Pandy 06/26/2017 Status post endovascular complete revascularization of occluded left middle cerebral artery with 1 pass with Solitaire 4 mm x 40 mm FR retrieval device and a 6 [place] intermediary guide catheter achieving a TICI 3 reperfusion.  Mr Brain Wo Contrast 06/27/2017 Completed acute infarction affecting the majority of the inferior division left MCA distribution. This affects the left temporal lobe and posterior frontal lobe with patchy involvement in the parietal region.  Infarction of the caudate and putamen as well.  Petechial blood product deposition without focal hematoma.  Swelling  but no shift at this time.  Ct Cerebral Perfusion W Contrast 06/26/2017 Left M1 emergent large vessel occlusion with associated acute left MCA territory infarct.  Core infarct is estimated at 49 mL.  The tissue at risk is estimated at 182 mL.   Ct Head Code Stroke W/o Cm 06/26/2017 1. Focal hyperdensity in left distal M1 may represent thrombus.  2. No vascular territory infarct identified at this time.  3. Small left frontal subdural hematoma is stable in size and attenuation from 2014.  4. ASPECTS is 10   06/29/2017 1. Evolving large LEFT MCA territory infarct (LEFT basal ganglia, LEFT temporoparietal lobes) with petechial hemorrhage, no hemorrhagic conversion.  2. Acute small volume LEFT cerebrum extra-axial blood products.  3. Chronic changes include old small LEFT frontal subdural hematoma and small LEFT frontal encephalomalacia.    Transthoracic echocardiogram 06/28/2017 Study Conclusions - Left ventricle: The cavity size was normal. There was mild   concentric hypertrophy. Systolic function was normal. The    estimated ejection fraction was in the range of 50% to 55%.   Hypokinesis of the apical inferior and akinesis of the basal to   mid inferior myocardium. Doppler parameters are consistent with   indeterminate ventricular filling pressure. - Aortic valve: Valve mobility was restricted. There was mild   stenosis. There was mild regurgitation. Peak velocity (S): 300   cm/s. Mean gradient (S): 19 mm Hg. Valve area (VTI): 1.64 cm^2.   Valve area (Vmax): 1.49 cm^2. Valve area (Vmean): 1.39 cm^2.   Regurgitation pressure half-time: 723 ms. - Mitral valve: Transvalvular velocity was within the normal range.   There was no evidence for stenosis. There was no regurgitation. - Left atrium: The atrium was severely dilated. - Right ventricle: The cavity size was normal. Wall thickness was   normal. Systolic function was normal. - Tricuspid valve: There was mild regurgitation. -  Pulmonary arteries: Systolic pressure was within the normal   range. PA peak pressure: 27 mm Hg (S)   PHYSICAL EXAM  Temp:  [98.4 F (36.9 C)-101.3 F (38.5 C)] 98.4 F (36.9 C) (07/15 0400) Pulse Rate:  [40-125] 80 (07/15 0700) Resp:  [16-32] 21 (07/15 0700) BP: (87-133)/(44-81) 110/71 (07/15 0700) SpO2:  [91 %-100 %] 99 % (07/15 0700) Arterial Line BP: (78-169)/(39-67) 138/67 (07/15 0545) FiO2 (%):  [40 %] 40 % (07/15 0400) Weight:  [93.9 kg (207 lb 0.2 oz)] 93.9 kg (207 lb 0.2 oz) (07/15 0457)  General - Well nourished, well developed, intubated off sedation but on pressor.  Ophthalmologic - Fundi not visualized due to noncooperation.  Cardiovascular - irregularly irregular heart rate and rhythm with RVR.  Extremities - right groin JP drain, would dress dry and clean.   Neuro -  intubated off sedation but on pressor, drowsy but arosable, not following commands, eye left gaze preference, barely cross midline, blinking to visual threat on the left but not on the right. PERRL. Facial symmetry not able to test due to ET tube. Spontaneous movement of LUE and LLE, at least 3/5 LUE and 2/5 LLE. Hemiplegic on the right, only trace withdraw on pain stimulation. DTR 1+ and right positive babinski. Sensation, coordination and gait not tested.    ASSESSMENT/PLAN Robert Keith is a 83 y.o. male with history of prostate cancer, CAD, history of GI bleed, DM2, essential tremor, hypertension, history of atrial fibrillation and subdural hematoma, s/p craniectomy and clot evacuation who presented with right facial droop, dense right hemiplegia global aphasia, and left gaze deviation. He recevied tPA at 1507 on 06/26/2017.  CTA Occluded left M1 -> IR thrombectomy TICI 3 reperfusion with Dr. Estanislado Pandy.  Stroke: left MCA infarct due to left M1 occlusion due to afib not on AC  Resultant  aphasia and right hemiplegia  CT head:  Focal hyperdensity in left distal M1 may represent thrombus. Small left  frontal subdural hematoma is stable in size and attenuation from 2014.   MRI head: left MCA infarct. Petechial blood product deposition without focal hematoma.  CT Angio Head: Emergent large vessel occlusion of the left M1 segment. Dense calcifications at the carotid bifurcations bilaterally without significant stenoses  2D Echo - EF 50-55%. Left atrium severely dilated. No cardiac source of emboli identified.  LDL 78  HgbA1c 6.8  SCDs for VTE prophylaxis Diet NPO time specified  No antithrombotic prior to admission, now on No antithrombotic. Pt not on AC at baseline due to previous SDH s/p crani and evacuation. Currently not candidate for [facility] due to large size of stroke, large groin hematoma s/p evacuation and dropping H&H.  Patient counseled to be compliant with his antithrombotic medications  Ongoing aggressive stroke risk factor management  Therapy recommendations:  pending  Disposition:   pending  Right femoral pseudoaneurysm with massive thigh hematoma s/p IR due to PFA tear   VVS on board  requiring surgical suturing and clot evacuation  JP drain  Respiratory failure  Intubated on weaning this am  CCM on board  Extubate as per CCM  Afib RVR not on [facility]  Followed with cardiology  Not AC candidate due to previous SDH s/p crani and evacuation  Currently not candidate due to large size of stroke, large groin hematoma s/p evacuation and anemia.  On metoprolol for RVR   Hyperlipidemia  Home meds: pravastatin 40mg  PO daily  LDL 78, goal < 70  Statin resumed  Continue statin at discharge  Diabetes  HgbA1c 6.8, goal < 7.0  Controlled  SSI  Anemia  Due to acute blood loss  Hb 9.0->8.2->7.8  Close monitoring  PRBC transfusion if needed  Other Stroke Risk Factors  Advanced age  Overweight, Body mass index is 30.57 kg/m., recommend weight loss, diet and exercise as appropriate   Family hx stroke (Mother)  Coronary artery disease  Other  Active Problems  Thrombocytopenia - 74 -> 96  Leukocytosis - 11.1->9.3->7.4->13.1  Mild hypokalemia - 3.4 -> 3.3  Hypophosphatemia - 2.1  GI bleed history   Hospital day # 4  This patient is critically ill due to large left MCA infarct s/p IR, right groin hematoma s/p evacuation, afib RVR, anemia, respiratory failure and at significant risk of neurological worsening, death form recurrent stroke, hemorrhagic transformation, shock, heart failure, aspiration pneumonia. This patient's  care requires constant monitoring of vital signs, hemodynamics, respiratory and cardiac monitoring, review of multiple databases, neurological assessment, discussion with family, other specialists and medical decision making of high complexity. I spent 35 minutes of neurocritical care time in the care of this patient. I had long discussion with family members at bedside, updated pt current condition, treatment plan and potential prognosis. They expressed understanding and appreciation.   Robert Hawking, MD PhD Stroke Neurology 06/30/2017 10:30 AM   To contact Stroke Continuity provider, please refer to [email or web address] After hours, contact General Neurology

## 2017-06-30 NOTE — Progress Notes (Signed)
CRITICAL VALUE ALERT  Critical Value:  Lactic acid - 3.1  Date & Time Notied:  06/30/2017 @ 8416  Provider Notified: CCM via [place]  Orders Received/Actions taken: No new orders given at this time. Discussed with MD about pt's BP and that I have not had to titrate up on the neo significantly; therefore, MD stated that there are no further interventions to be done for lactic acid level at this time.   Will continue to monitor closely.  Guadalupe Maple, RN

## 2017-07-01 ENCOUNTER — Inpatient Hospital Stay (HOSPITAL_COMMUNITY): Payer: Medicare Other

## 2017-07-01 DIAGNOSIS — S7011XA Contusion of right thigh, initial encounter: Secondary | ICD-10-CM

## 2017-07-01 DIAGNOSIS — I63412 Cerebral infarction due to embolism of left middle cerebral artery: Principal | ICD-10-CM

## 2017-07-01 LAB — TYPE AND SCREEN
ABO/RH(D): A POS
Antibody Screen: NEGATIVE
UNIT DIVISION: 0
UNIT DIVISION: 0
UNIT DIVISION: 0
UNIT DIVISION: 0
UNIT DIVISION: 0
Unit division: 0
Unit division: 0
Unit division: 0
Unit division: 0
Unit division: 0

## 2017-07-01 LAB — CBC
HCT: 23.5 % — ABNORMAL LOW (ref 39.0–52.0)
HEMOGLOBIN: 7.8 g/dL — AB (ref 13.0–17.0)
MCH: 29.4 pg (ref 26.0–34.0)
MCHC: 33.2 g/dL (ref 30.0–36.0)
MCV: 88.7 fL (ref 78.0–100.0)
Platelets: 127 10*3/uL — ABNORMAL LOW (ref 150–400)
RBC: 2.65 MIL/uL — AB (ref 4.22–5.81)
RDW: 16.3 % — ABNORMAL HIGH (ref 11.5–15.5)
WBC: 11.9 10*3/uL — ABNORMAL HIGH (ref 4.0–10.5)

## 2017-07-01 LAB — BPAM RBC
BLOOD PRODUCT EXPIRATION DATE: 201807172359
BLOOD PRODUCT EXPIRATION DATE: 201807252359
BLOOD PRODUCT EXPIRATION DATE: 201807252359
BLOOD PRODUCT EXPIRATION DATE: 201807252359
Blood Product Expiration Date: 201807172359
Blood Product Expiration Date: 201807182359
Blood Product Expiration Date: 201807182359
Blood Product Expiration Date: 201807252359
Blood Product Expiration Date: 201807252359
Blood Product Expiration Date: 201807252359
ISSUE DATE / TIME: 201807121215
ISSUE DATE / TIME: 201807121311
ISSUE DATE / TIME: 201807121508
ISSUE DATE / TIME: 201807121508
ISSUE DATE / TIME: 201807121629
ISSUE DATE / TIME: 201807121629
ISSUE DATE / TIME: 201807121650
ISSUE DATE / TIME: 201807121650
UNIT TYPE AND RH: 6200
UNIT TYPE AND RH: 6200
UNIT TYPE AND RH: 6200
UNIT TYPE AND RH: 6200
UNIT TYPE AND RH: 6200
UNIT TYPE AND RH: 6200
Unit Type and Rh: 6200
Unit Type and Rh: 6200
Unit Type and Rh: 6200
Unit Type and Rh: 6200

## 2017-07-01 LAB — URINE CULTURE: Culture: NO GROWTH

## 2017-07-01 LAB — GLUCOSE, CAPILLARY
GLUCOSE-CAPILLARY: 151 mg/dL — AB (ref 65–99)
GLUCOSE-CAPILLARY: 166 mg/dL — AB (ref 65–99)
GLUCOSE-CAPILLARY: 136 mg/dL — AB (ref 65–99)
Glucose-Capillary: 177 mg/dL — ABNORMAL HIGH (ref 65–99)
Glucose-Capillary: 157 mg/dL — ABNORMAL HIGH (ref 65–99)
Glucose-Capillary: 142 mg/dL — ABNORMAL HIGH (ref 65–99)

## 2017-07-01 LAB — BASIC METABOLIC PANEL
ANION GAP: 6 (ref 5–15)
BUN: 20 mg/dL (ref 6–20)
CALCIUM: 7.6 mg/dL — AB (ref 8.9–10.3)
CO2: 27 mmol/L (ref 22–32)
Chloride: 112 mmol/L — ABNORMAL HIGH (ref 101–111)
Creatinine, Ser: 0.91 mg/dL (ref 0.61–1.24)
GFR calc non Af Amer: >60 mL/min (ref >60)
Glucose, Bld: 173 mg/dL — ABNORMAL HIGH (ref 65–99)
Potassium: 3.2 mmol/L — ABNORMAL LOW (ref 3.5–5.1)
Sodium: 145 mmol/L (ref 135–145)

## 2017-07-01 LAB — PROCALCITONIN: Procalcitonin: 5.47 ng/mL

## 2017-07-01 MED ORDER — ORAL CARE MOUTH RINSE
15.0000 mL | Freq: Two times a day (BID) | OROMUCOSAL | Status: DC
  Administered 2017-07-02 – 2017-07-17 (×30): 15 mL via OROMUCOSAL

## 2017-07-01 MED ORDER — CHLORHEXIDINE GLUCONATE 0.12 % MT SOLN
15.0000 mL | Freq: Two times a day (BID) | OROMUCOSAL | Status: DC
  Administered 2017-07-01 – 2017-07-17 (×31): 15 mL via OROMUCOSAL
  Filled 2017-07-01 (×28): qty 15

## 2017-07-01 MED ORDER — FUROSEMIDE 10 MG/ML IJ SOLN
20.0000 mg | Freq: Once | INTRAMUSCULAR | Status: AC
  Administered 2017-07-01: 20 mg via INTRAVENOUS
  Filled 2017-07-01: qty 2

## 2017-07-01 MED ORDER — POTASSIUM CHLORIDE 10 MEQ/100ML IV SOLN
10.0000 meq | INTRAVENOUS | Status: AC
  Administered 2017-07-01 (×4): 10 meq via INTRAVENOUS
  Filled 2017-07-01 (×3): qty 100

## 2017-07-01 MED ORDER — POTASSIUM CHLORIDE 10 MEQ/50ML IV SOLN: 10.0000 meq | INTRAVENOUS | Status: DC

## 2017-07-01 NOTE — Progress Notes (Signed)
OT Cancellation Note  Patient Details Name: TAYM TWIST MRN: [id number] DOB: 1933-08-19   Cancelled Treatment:    Reason Eval/Treat Not Completed: Patient not medically ready. Pt remains intubated and sedated. Per RN, pt improving but remains inappropriate for participation with therapy this date. Will check back next day to potentially progress with OT evaluation.   Norman Herrlich, MS OTR/L  Pager: [phone]   Norman Herrlich 07/01/2017, 9:14 AM

## 2017-07-01 NOTE — Progress Notes (Addendum)
Progress Note    07/01/2017 9:03 AM 4 Days Post-Op  Subjective:  Intubated, but in the process of getting extubated.  Tm 99.3 now 98.9 HR 100's-120's Afib 16'R-678'L systolic 381% .01BPZ0   Vitals:   07/01/17 0747 07/01/17 0839  BP: 129/71   Pulse: (!) 104   Resp: 12   Temp:  98.9 F (37.2 C)    Physical Exam: Lungs:  Non labored Incisions:  Clean and dry with staples in tact.  Drains still with bloody drainage.  Scrotum swollen.   CBC    Component Value Date/Time   WBC 11.9 (H) 07/01/2017 0240   RBC 2.65 (L) 07/01/2017 0240   HGB 7.8 (L) 07/01/2017 0240   HGB 15.2 12/22/2014 1430   HCT 23.5 (L) 07/01/2017 0240   HCT 46.1 12/22/2014 1430   PLT 127 (L) 07/01/2017 0240   PLT 194 12/22/2014 1430   MCV 88.7 07/01/2017 0240   MCV 96 12/22/2014 1430   MCH 29.4 07/01/2017 0240   MCHC 33.2 07/01/2017 0240   RDW 16.3 (H) 07/01/2017 0240   RDW 14.0 12/22/2014 1430   LYMPHSABS 0.7 06/27/2017 0314   LYMPHSABS 1.6 12/22/2014 1430   MONOABS 0.9 06/27/2017 0314   MONOABS 1.1 (H) 12/22/2014 1430   EOSABS 0.0 06/27/2017 0314   EOSABS 0.1 12/22/2014 1430   BASOSABS 0.0 06/27/2017 0314   BASOSABS 0.1 12/22/2014 1430    BMET    Component Value Date/Time   NA 145 07/01/2017 0240   NA 142 12/22/2014 1430   K 3.2 (L) 07/01/2017 0240   K 4.3 12/22/2014 1430   CL 112 (H) 07/01/2017 0240   CL 108 (H) 12/22/2014 1430   CO2 27 07/01/2017 0240   CO2 27 12/22/2014 1430   GLUCOSE 173 (H) 07/01/2017 0240   GLUCOSE 119 (H) 12/22/2014 1430   BUN 20 07/01/2017 0240   BUN 20 (H) 12/22/2014 1430   CREATININE 0.91 07/01/2017 0240   CREATININE 1.13 12/22/2014 1430   CALCIUM 7.6 (L) 07/01/2017 0240   CALCIUM 8.8 12/22/2014 1430   GFRNONAA >60 07/01/2017 0240   GFRNONAA >60 12/22/2014 1430   GFRAA >60 07/01/2017 0240   GFRAA >60 12/22/2014 1430    INR    Component Value Date/Time   INR 1.59 06/27/2017 1828     Intake/Output Summary (Last 24 hours) at 07/01/17  0903 Last data filed at 07/01/17 0700  Gross per 24 hour  Intake             1385 ml  Output             3820 ml  Net            -2435 ml     Assessment:  83 y.o. male is s/p:  right groin exploration and primary arterial repair with drain placement  4 Days Post-Op  Plan: -pt in process of getting extubated. -keep drains as they have to much drainage to remove. -acute blood loss anemia-stable from yesterday; thrombocytopenia improved today from yesterday -DVT prophylaxis:  May start DVT prophylaxis from vascular standpoint per Dr. Kelby Fam, PA-C [facility] [phone] 07/01/2017 9:03 AM   Addendum  I have independently interviewed and examined the patient, and I agree with the physician assistant's findings.  Output from [place] drain not unexpected given the large hematoma cavity in R thigh.  Fortunately outputs seem to be starting to come down.  - Continue conservative wound measures and continued [place] drainage from now -  H/H stable.  Defer any transfusion to primary team. - Agree that anticoagulation vs DVT prophylaxis is ok at this point from an arterial repair viewpoint. - will continue to follow with you.  Laboratory   CBC CBC Latest Ref Rng & Units 07/01/2017 06/30/2017 06/29/2017  WBC 4.0 - 10.5 K/uL 11.9(H) 13.1(H) 7.4  Hemoglobin 13.0 - 17.0 g/dL 7.8(L) 7.9(L) 8.4(L)  Hematocrit 39.0 - 52.0 % 23.5(L) 23.2(L) 24.4(L)  Platelets 150 - 400 K/uL 127(L) 96(L) 87(L)    BMP BMP Latest Ref Rng & Units 07/01/2017 06/30/2017 06/29/2017  Glucose 65 - 99 mg/dL 173(H) 218(H) 186(H)  BUN 6 - 20 mg/dL 20 17 15   Creatinine 0.61 - 1.24 mg/dL 0.91 1.04 0.90  Sodium 135 - 145 mmol/L 145 141 142  Potassium 3.5 - 5.1 mmol/L 3.2(L) 3.3(L) 3.4(L)  Chloride 101 - 111 mmol/L 112(H) 111 115(H)  CO2 22 - 32 mmol/L 27 22 21(L)  Calcium 8.9 - 10.3 mg/dL 7.6(L) 7.3(L) 7.4(L)    Coagulation Lab Results  Component Value Date   INR 1.59 06/27/2017   INR  1.67 06/27/2017   INR 1.13 06/26/2017   No results found for: PTT  Lipids    Component Value Date/Time   CHOL 115 06/27/2017 0500   TRIG 86 06/27/2017 0500   HDL 20 (L) 06/27/2017 0500   CHOLHDL 5.8 06/27/2017 0500   VLDL 17 06/27/2017 0500   LDLCALC 78 06/27/2017 0500    Adele Barthel, MD, FACS [facility] [place] Office: [phone] Pager: [phone]  07/01/2017, 10:00 AM

## 2017-07-01 NOTE — Progress Notes (Signed)
SLP Cancellation Note  Patient Details Name: MCLAIN FREER MRN: [id number] DOB: 1933/03/03   Cancelled treatment:       Reason Eval/Treat Not Completed: Patient not medically ready. Order for swallow evaluation received now that pt was extubated this morning. Per chart review he seems to be aphonic. Per discussion with nursing he does not have a strong cough, but he does still have feeding tube in place. Given the above information s/p prolonged intubation, will plan to see pt on next date for evaluation.   Germain Osgood 07/01/2017, 1:41 PM  Germain Osgood, M.A. CCC-SLP [phone]

## 2017-07-01 NOTE — Progress Notes (Signed)
STROKE TEAM PROGRESS NOTE   SUBJECTIVE (INTERVAL HISTORY) His daughter is at the bedside. Pt still intubated, afebrile, BP improved, CXR seems better, more awake alert. Pending extubation today as planned. Still has JP drain at left groin area.     OBJECTIVE Temp:  [98 F (36.7 C)-99.3 F (37.4 C)] 98.2 F (36.8 C) (07/16 1219) Pulse Rate:  [46-129] 60 (07/16 1300) Cardiac Rhythm: Sinus tachycardia (07/16 1200) Resp:  [0-24] 16 (07/16 1300) BP: (94-145)/(47-86) 126/70 (07/16 1300) SpO2:  [81 %-100 %] 99 % (07/16 1300) FiO2 (%):  [40 %] 40 % (07/16 0800) Weight:  [206 lb 2.1 oz (93.5 kg)] 206 lb 2.1 oz (93.5 kg) (07/16 0400)  CBC:  Recent Labs Lab 06/26/17 1420  06/27/17 0314  06/30/17 0445 07/01/17 0240  WBC 5.9  --  11.1*  < > 13.1* 11.9*  NEUTROABS 3.1  --  9.4*  --   --   --   HGB 12.3*  < > 8.8*  < > 7.9* 7.8*  HCT 37.9*  < > 26.9*  < > 23.2* 23.5*  MCV 92.2  --  92.1  < > 86.9 88.7  PLT 195  --  153  < > 96* 127*  < > = values in this interval not displayed.  Basic Metabolic Panel:  Recent Labs Lab 06/27/17 0314  06/29/17 0400  06/30/17 0445 07/01/17 0240  NA 139  < > 141  < > 141 145  K 3.9  < > 3.4*  < > 3.3* 3.2*  CL 114*  < > 111  < > 111 112*  CO2 20*  < > 23  < > 22 27  GLUCOSE 165*  < > 184*  < > 218* 173*  BUN 16  < > 16  < > 17 20  CREATININE 0.89  < > 0.97  < > 1.04 0.91  CALCIUM 7.4*  < > 7.4*  < > 7.3* 7.6*  MG 1.7  --  1.7  --   --   --   PHOS 3.2  --  2.1*  --   --   --   < > = values in this interval not displayed.  Lipid Panel:     Component Value Date/Time   CHOL 115 06/27/2017 0500   TRIG 86 06/27/2017 0500   HDL 20 (L) 06/27/2017 0500   CHOLHDL 5.8 06/27/2017 0500   VLDL 17 06/27/2017 0500   LDLCALC 78 06/27/2017 0500   HgbA1c:  Lab Results  Component Value Date   HGBA1C 6.8 (H) 06/27/2017   Urine Drug Screen: No results found for: LABOPIA, COCAINSCRNUR, LABBENZ, AMPHETMU, THCU, LABBARB  Alcohol Level No results found for:  Bernard I have personally reviewed the radiological images below and agree with the radiology interpretations.  Ct Abdomen Pelvis Wo Contrast 06/27/2017 1. Hematoma within the proximal RIGHT anterior thigh is incompletely imaged. Hematoma likely spontaneous related to anticoagulation. Recommend clinical correlation with extent of hematoma in the RIGHT thigh.  2. No intraperitoneal or retroperitoneal hemorrhage.  3. Extensive diverticulosis without diverticulitis.  4.  Aortic Atherosclerosis (ICD10-I70.0).  5. Small bilateral pleural effusions.    Ct Angio Head and neck W and Wo Contrast 06/26/2017 1. Emergent large vessel occlusion of the left M1 segment.  2. There is some reconstitution of anterior left MCA branch vessels from pial collaterals.  3. Dense calcifications at the carotid bifurcations bilaterally without significant stenoses relative to the more distal vessels.  4. Moderate narrowing of  the proximal posterior cerebral arteries bilaterally, right greater than left.  5. Calcifications at great vessel origins along the arch without significant stenosis. 6. Hypoplastic left vertebral artery.  7. Multilevel cervical spinal spondylosis.  8. Emphysema.  9. Prior granulomatous disease in the right upper lobe.  Cerebral aneurysm and thrombectomy - Dr. Estanislado Pandy 06/26/2017 Status post endovascular complete revascularization of occluded left middle cerebral artery with 1 pass with Solitaire 4 mm x 40 mm FR retrieval device and a 6 [place] intermediary guide catheter achieving a TICI 3 reperfusion.  Mr Brain Wo Contrast 06/27/2017 Completed acute infarction affecting the majority of the inferior division left MCA distribution. This affects the left temporal lobe and posterior frontal lobe with patchy involvement in the parietal region.  Infarction of the caudate and putamen as well.  Petechial blood product deposition without focal hematoma.  Swelling but no shift at this  time.  Ct Cerebral Perfusion W Contrast 06/26/2017 Left M1 emergent large vessel occlusion with associated acute left MCA territory infarct.  Core infarct is estimated at 49 mL.  The tissue at risk is estimated at 182 mL.   Ct Head Code Stroke W/o Cm 06/26/2017 1. Focal hyperdensity in left distal M1 may represent thrombus.  2. No vascular territory infarct identified at this time.  3. Small left frontal subdural hematoma is stable in size and attenuation from 2014.  4. ASPECTS is 10   06/29/2017 1. Evolving large LEFT MCA territory infarct (LEFT basal ganglia, LEFT temporoparietal lobes) with petechial hemorrhage, no hemorrhagic conversion.  2. Acute small volume LEFT cerebrum extra-axial blood products.  3. Chronic changes include old small LEFT frontal subdural hematoma and small LEFT frontal encephalomalacia.    Transthoracic echocardiogram 06/28/2017 Study Conclusions - Left ventricle: The cavity size was normal. There was mild   concentric hypertrophy. Systolic function was normal. The    estimated ejection fraction was in the range of 50% to 55%.   Hypokinesis of the apical inferior and akinesis of the basal to   mid inferior myocardium. Doppler parameters are consistent with   indeterminate ventricular filling pressure. - Aortic valve: Valve mobility was restricted. There was mild   stenosis. There was mild regurgitation. Peak velocity (S): 300   cm/s. Mean gradient (S): 19 mm Hg. Valve area (VTI): 1.64 cm^2.   Valve area (Vmax): 1.49 cm^2. Valve area (Vmean): 1.39 cm^2.   Regurgitation pressure half-time: 723 ms. - Mitral valve: Transvalvular velocity was within the normal range.   There was no evidence for stenosis. There was no regurgitation. - Left atrium: The atrium was severely dilated. - Right ventricle: The cavity size was normal. Wall thickness was   normal. Systolic function was normal. - Tricuspid valve: There was mild regurgitation. - Pulmonary arteries:  Systolic pressure was within the normal   range. PA peak pressure: 27 mm Hg (S)   PHYSICAL EXAM  Temp:  [98 F (36.7 C)-99.3 F (37.4 C)] 98.2 F (36.8 C) (07/16 1219) Pulse Rate:  [46-129] 60 (07/16 1300) Resp:  [0-24] 16 (07/16 1300) BP: (94-145)/(47-86) 126/70 (07/16 1300) SpO2:  [81 %-100 %] 99 % (07/16 1300) FiO2 (%):  [40 %] 40 % (07/16 0800) Weight:  [206 lb 2.1 oz (93.5 kg)] 206 lb 2.1 oz (93.5 kg) (07/16 0400)  General - Well nourished, well developed, intubated off sedation but on pressor.  Ophthalmologic - Fundi not visualized due to noncooperation.  Cardiovascular - irregularly irregular heart rate and rhythm with RVR.  Extremities - right groin JP  drain, would dress dry and clean.   Neuro - intubated off sedation but on pressor, drowsy but arosable, not following commands, eye left gaze preference, barely cross midline, blinking to visual threat on the left but not on the right. PERRL. Facial symmetry not able to test due to ET tube. Spontaneous movement of LUE and LLE, at least 3/5 LUE and 2/5 LLE. Hemiplegic on the right, only trace withdraw on pain stimulation. DTR 1+ and right positive babinski. Sensation, coordination and gait not tested.    ASSESSMENT/PLAN [name] is a 83 y.o. male with history of prostate cancer, CAD, history of GI bleed, DM2, essential tremor, hypertension, history of atrial fibrillation and subdural hematoma, s/p craniectomy and clot evacuation who presented with right facial droop, dense right hemiplegia global aphasia, and left gaze deviation. He recevied tPA at 1507 on 06/26/2017.  CTA Occluded left M1 -> IR thrombectomy TICI 3 reperfusion with Dr. Estanislado Pandy.  Stroke: left MCA infarct due to left M1 occlusion due to afib not on AC  Resultant  aphasia and right hemiplegia  CT head:  Focal hyperdensity in left distal M1 may represent thrombus. Small left frontal subdural hematoma is stable in size and attenuation from 2014.   MRI  head: left MCA infarct. Petechial blood product deposition without focal hematoma.  CT Angio Head: Emergent large vessel occlusion of the left M1 segment. Dense calcifications at the carotid bifurcations bilaterally without significant stenoses  2D Echo - EF 50-55%. Left atrium severely dilated. No cardiac source of emboli identified.  LDL 78  HgbA1c 6.8  SCDs for VTE prophylaxis Diet NPO time specified  No antithrombotic prior to admission, now on No antithrombotic. Pt not on AC at baseline due to previous SDH s/p crani and evacuation. Currently not candidate for [facility] due to large size of stroke, large groin hematoma s/p evacuation and dropping H&H.  Patient counseled to be compliant with his antithrombotic medications  Ongoing aggressive stroke risk factor management  Therapy recommendations:  pending  Disposition:   pending  Right femoral pseudoaneurysm with massive thigh hematoma s/p IR due to PFA tear   VVS on board  requiring surgical suturing and clot evacuation  Dress dry and clean  JP drain  Respiratory failure  Intubated on weaning this am  CCM on board  Extubate today planned  Afib RVR not on [facility]  Followed with cardiology  Not AC candidate due to previous SDH s/p crani and evacuation  Currently not candidate due to large size of stroke, large groin hematoma s/p evacuation and anemia.  On metoprolol for RVR   Hyperlipidemia  Home meds: pravastatin 40mg  PO daily  LDL 78, goal < 70  Statin resumed  Continue statin at discharge  Diabetes  HgbA1c 6.8, goal < 7.0  Controlled  SSI  Anemia  Due to acute blood loss  Hb 9.0->8.2->7.8->7.8  Close monitoring  PRBC transfusion if needed  Other Stroke Risk Factors  Advanced age  Overweight, Body mass index is 30.44 kg/m., recommend weight loss, diet and exercise as appropriate   Family hx stroke (Mother)  Coronary artery disease  Other Active Problems  Thrombocytopenia - 74 -> 96  -> 127  Leukocytosis - 11.1->9.3->7.4->13.1 -> 11.9  Mild hypokalemia - 3.4 -> 3.3 -> 3.2  Hypophosphatemia - 2.1  GI bleed history   Hospital day # 5  This patient is critically ill due to large left MCA infarct s/p IR, right groin hematoma s/p evacuation, afib RVR, anemia, respiratory failure  and at significant risk of neurological worsening, death form recurrent stroke, hemorrhagic transformation, shock, heart failure, aspiration pneumonia. This patient's care requires constant monitoring of vital signs, hemodynamics, respiratory and cardiac monitoring, review of multiple databases, neurological assessment, discussion with family, other specialists and medical decision making of high complexity. I spent 35 minutes of neurocritical care time in the care of this patient. I had long discussion with family members at bedside, updated pt current condition, treatment plan and potential prognosis. They expressed understanding and appreciation.   Rosalin Hawking, MD PhD Stroke Neurology 07/01/2017 1:52 PM   To contact Stroke Continuity provider, please refer to [email or web address] After hours, contact General Neurology

## 2017-07-01 NOTE — Procedures (Signed)
Extubation Procedure Note  Patient Details:   Name: Robert Keith DOB: [birth year] MRN: [id number]   Airway Documentation:     Evaluation  O2 sats: stable throughout Complications: No apparent complications Patient did tolerate procedure well. Bilateral Breath Sounds: Diminished   No pt not able to vocalize. Unaware on how much patient understands. Pt is trying to whisper at times and nods head slightly. Pt cleared throat slightly. Pt was able to breathe around deflated cuff. No stridor noted at this time.   Irineo Axon [facility] 07/01/2017, 9:42 AM

## 2017-07-01 NOTE — Progress Notes (Signed)
PULMONARY / CRITICAL CARE MEDICINE   Name: Robert Keith MRN: [id number] DOB: December 15, 1933    ADMISSION DATE:  06/26/2017 CONSULTATION DATE:  06/26/17  REFERRING MD:  Dr. Leonie Man  CHIEF COMPLAINT:  CVA  HISTORY OF PRESENT ILLNESS:   83 year old man  presented 7/11 from home with witnessed onset of right facial droop, right hemiplegia and aphasia .  CT head showed a hyperdense left middle cerebral artery and chronic small left frontal subdural (unchanged from prior in 2014).  TPA was given   CTA showed left M1 occlusion and therefore patient taken to neuro IR for mechanical thrombectomy and revascularization.   past medical history of afib (not on NOAC 2/2 GI bleed), HTN, prior SDH in 2014 that required evacuation with residual small chronic SDH), DM, CAD, tremor, H. Pylori infection, and prostate cancer         STUDIES:  7/11 Head CT >> focal hyperdensity of left distal M1 may represent thrombus, no vascular territory infarct indentified, small left frontal SDH stable in size and attenuation from 2014, ASPECTS is 10 7/11 CTA head and neck>> emergent large vessel occlusion of left M1 segment, some reconstitution of anterior left MCA branch vessels from pial collaterals  7/12 CT abd >> hematoma in the anterior RIGHT thigh measuring 5.6 x 5.3 cm  7/12 MRI >> evolved lt MCA infarct  CULTURES: Respiratory 7/15 >> few GNR, few GPC >>  Urine 7/15 >> negative Blood 7/15 >> 1 of 2 coag-negative staph >>   ANTIBIOTICS: 7/11 Ancef (pre-op) ceftaz (empiric) 7/14 >>  vanco (empiric) 7/14 >>   SIGNIFICANT EVENTS: 7/11  Admit  7/11 right femoral sheath found to be kinked, TPA reversal deemed to risky. Sheath pulled in PACU.  7/12 >> emergent OR for left groin hematoma , profunda femoral repaired  LINES/TUBES: 7/11 ETT >> 7/16 7/11 OGT>> 7/16 7/11 Left Radial Aline >> 7/15 7/11 Right Femoral Sheath > 7/11    SUBJECTIVE:  Tolerating pressure support currently CXR 7/16 >> improvement in  perihilar bilateral infiltrates   VITAL SIGNS: BP 122/65   Pulse (!) 113   Temp 98.9 F (37.2 C) (Axillary)   Resp 18   Ht 5\' 9"  (1.753 m)   Wt 93.5 kg (206 lb 2.1 oz)   SpO2 100%   BMI 30.44 kg/m   HEMODYNAMICS:    VENTILATOR SETTINGS: Vent Mode: CPAP;PSV FiO2 (%):  [40 %] 40 % Set Rate:  [14 bmp] 14 bmp Vt Set:  [570 mL] 570 mL PEEP:  [5 cmH20] 5 cmH20 Pressure Support:  [5 cmH20] 5 cmH20 Plateau Pressure:  [9 cmH20-19 cmH20] 14 cmH20  INTAKE / OUTPUT: I/O last 3 completed shifts: In: 3122.5 [I.V.:1587.5; Other:85; NG/GT:750; IV Piggyback:700] Out: 57 [Urine:4410; Drains:480]  PHYSICAL EXAMINATION: General: Elderly man, intubated, no distress HEENT: ETT in place, no oral lesions Neuro: Able to follow commands, tracks, moderate cough CV: Regular, occasional PACs, rate 90s PULM: Decreased bilateral bases GI: Soft, benign, positive bowel sounds Extremities: JP drain remains in right thigh wound with some bloody drainage Skin: No rash   LABS:  BMET  Recent Labs Lab 06/29/17 1734 06/30/17 0445 07/01/17 0240  NA 142 141 145  K 3.4* 3.3* 3.2*  CL 115* 111 112*  CO2 21* 22 27  BUN 15 17 20   CREATININE 0.90 1.04 0.91  GLUCOSE 186* 218* 173*    Electrolytes  Recent Labs Lab 06/27/17 0314  06/29/17 0400 06/29/17 1734 06/30/17 0445 07/01/17 0240  CALCIUM 7.4*  < >  7.4* 7.4* 7.3* 7.6*  MG 1.7  --  1.7  --   --   --   PHOS 3.2  --  2.1*  --   --   --   < > = values in this interval not displayed.  CBC  Recent Labs Lab 06/29/17 1524 06/30/17 0445 07/01/17 0240  WBC 7.4 13.1* 11.9*  HGB 8.4* 7.9* 7.8*  HCT 24.4* 23.2* 23.5*  PLT 87* 96* 127*    Coag's  Recent Labs Lab 06/26/17 1420 06/27/17 1515 06/27/17 1828  APTT 29 31 31   INR 1.13 1.67 1.59    Sepsis Markers  Recent Labs Lab 06/29/17 1734 06/29/17 2045 06/30/17 0005 06/30/17 0445 07/01/17 0240  LATICACIDVEN 2.4* 2.8*  --  3.1*  --   PROCALCITON  --   --  5.57 6.30  5.47    ABG  Recent Labs Lab 06/27/17 1719 06/29/17 1701 06/30/17 0010  PHART 7.234* 7.423 7.475*  PCO2ART 45.1 25.2* 29.9*  PO2ART 474.0* 54.0* 65.2*    Liver Enzymes  Recent Labs Lab 06/26/17 1420  AST 34  ALT 33  ALKPHOS 88  BILITOT 0.9  ALBUMIN 3.6    Cardiac Enzymes  Recent Labs Lab 06/27/17 1355  TROPONINI <0.03    Glucose  Recent Labs Lab 06/30/17 1159 06/30/17 1532 06/30/17 1939 06/30/17 2320 07/01/17 0303 07/01/17 0839  GLUCAP 155* 144* 153* 161* 151* 177*    CXR - ETT in position, reviewed 7/12    DISCUSSION: 56 yoM with PMH of Afib- not on coag  With Left MCA-CVA s/p neuro IR. Right femoral pseudoaneurysm and thigh hematoma following sheath removal. Complicated by hemorrhagic shock, resolved. Tolerating pressure support, improved mental status and chest x-ray. Goal extubation 7/16  ASSESSMENT / PLAN:  PULMONARY A: Post op resp failure, VDRF B infiltrate 7/14, ? Edema v TRALI, consider PNA esp w fever 7/14 pm > improved 7/16 w diuresis P:   Based on improvement in chest x-ray suspect that the lateral perihilar infiltrates were pulmonary edema (consider TRALI) as opposed to infection.  Currently tolerating pressure support, improved mental status. We will push for extubation today 7/16 Consider narrowing antibiotics 7/17 depending on respiratory culture results Based on patient's stated wishes, goals for care and on discussions with family he will not be reintubated if he were to decline after extubation.   CARDIOVASCULAR A:  Hemorrhagic shock, consider also component septic shock Afib - not on anticoagulation 2/2 previous GI bleed Hx CAD, HTN  Echo 09/2016 >> RVSP 50, mild AS P:  Phenylephrine weaned to off But are wall decreased to 25 mg twice a day on 7/15 Repeat low dose Lasix 1  RENAL A:   Hypocalcemia Hypokalemia Hypokalemia AKI related to hypotension, improving P:   Repeat low-dose diuretics 7/16 Follow BMP, urine  output next on replace electrolytes as indicated, potassium on 7/16  GASTROINTESTINAL A:   Nutrition Needs  P:   Swallowing evaluation post extubation, start diet when he can tolerate PPI prophylaxis  HEMATOLOGIC A:   Anemia- mild , chronic + acute blood loss Rt thigh hematoma s/p profunda repair, drains P:  Follow CBC daily, decreased frequency given hemoglobin stability Follow right thigh drain output, removal per vascular recommendations SCD in place  INFECTIOUS:  A: Bilateral pulmonary infiltrates, HCAP possible, less likely given improvement with diuresis 1 of 2 coag-negative staph, significance unclear, suspect contaminant P:  Empiric ceftaz edema and vancomycin added on 7/14, can likely narrow. We will do so based on respirator culture  results when available  ENDOCRINE A:   DM   P:   Sliding scale insulin and CBG per protocol  NEUROLOGIC A:   Left M1 occlusion s/p TPA and mechanical thrombectomy w/revascularization  - with right hemiplegia, global aphasia and left gaze   Chronic small left frontal SDH from prior SDH in 2014 that required  Hx prior SDH in 2014 requiring craniotomy  P:   RASS goal: 0 Minimize sedating medications Further management per neurology plans   FAMILY  - Updates: Updated family at bedside 7/14 - 7/16  - Inter-disciplinary family meet or Palliative Care meeting due by:  Done, 7/12  Independent CC time 68 minutes  Baltazar Apo, MD, PhD 07/01/2017, 10:26 AM [facility] [phone] or if no answer [phone]

## 2017-07-02 ENCOUNTER — Encounter (HOSPITAL_COMMUNITY): Payer: Self-pay

## 2017-07-02 ENCOUNTER — Inpatient Hospital Stay (HOSPITAL_COMMUNITY): Payer: Medicare Other

## 2017-07-02 DIAGNOSIS — T81719S Complication of unspecified artery following a procedure, not elsewhere classified, sequela: Secondary | ICD-10-CM

## 2017-07-02 DIAGNOSIS — R4701 Aphasia: Secondary | ICD-10-CM

## 2017-07-02 DIAGNOSIS — J9600 Acute respiratory failure, unspecified whether with hypoxia or hypercapnia: Secondary | ICD-10-CM

## 2017-07-02 DIAGNOSIS — R29810 Facial weakness: Secondary | ICD-10-CM

## 2017-07-02 DIAGNOSIS — I728 Aneurysm of other specified arteries: Secondary | ICD-10-CM

## 2017-07-02 LAB — BASIC METABOLIC PANEL
Anion gap: 9 (ref 5–15)
BUN: 22 mg/dL — ABNORMAL HIGH (ref 6–20)
CALCIUM: 8 mg/dL — AB (ref 8.9–10.3)
CO2: 24 mmol/L (ref 22–32)
CREATININE: 0.81 mg/dL (ref 0.61–1.24)
Chloride: 112 mmol/L — ABNORMAL HIGH (ref 101–111)
GFR calc non Af Amer: >60 mL/min (ref >60)
Glucose, Bld: 166 mg/dL — ABNORMAL HIGH (ref 65–99)
Potassium: 3.5 mmol/L (ref 3.5–5.1)
SODIUM: 145 mmol/L (ref 135–145)

## 2017-07-02 LAB — GLUCOSE, CAPILLARY
GLUCOSE-CAPILLARY: 149 mg/dL — AB (ref 65–99)
GLUCOSE-CAPILLARY: 183 mg/dL — AB (ref 65–99)
GLUCOSE-CAPILLARY: 180 mg/dL — AB (ref 65–99)
GLUCOSE-CAPILLARY: 207 mg/dL — AB (ref 65–99)
Glucose-Capillary: 156 mg/dL — ABNORMAL HIGH (ref 65–99)
Glucose-Capillary: 160 mg/dL — ABNORMAL HIGH (ref 65–99)

## 2017-07-02 LAB — CBC
HCT: 27.3 % — ABNORMAL LOW (ref 39.0–52.0)
Hemoglobin: 8.7 g/dL — ABNORMAL LOW (ref 13.0–17.0)
MCH: 29 pg (ref 26.0–34.0)
MCHC: 31.9 g/dL (ref 30.0–36.0)
MCV: 91 fL (ref 78.0–100.0)
PLATELETS: 183 10*3/uL (ref 150–400)
RBC: 3 MIL/uL — ABNORMAL LOW (ref 4.22–5.81)
RDW: 16.6 % — AB (ref 11.5–15.5)
WBC: 13.2 10*3/uL — AB (ref 4.0–10.5)

## 2017-07-02 LAB — CULTURE, BLOOD (ROUTINE X 2): Special Requests: ADEQUATE

## 2017-07-02 LAB — CULTURE, RESPIRATORY: CULTURE: NORMAL

## 2017-07-02 LAB — CULTURE, RESPIRATORY W GRAM STAIN

## 2017-07-02 LAB — MAGNESIUM: Magnesium: 2.2 mg/dL (ref 1.7–2.4)

## 2017-07-02 MED ORDER — PRO-STAT SUGAR FREE PO LIQD
30.0000 mL | Freq: Two times a day (BID) | ORAL | Status: DC
  Administered 2017-07-02 – 2017-07-17 (×29): 30 mL
  Filled 2017-07-02 (×32): qty 30

## 2017-07-02 MED ORDER — OSMOLITE 1.2 CAL PO LIQD
1000.0000 mL | ORAL | Status: DC
  Administered 2017-07-02: 1000 mL
  Filled 2017-07-02 (×6): qty 1000

## 2017-07-02 MED ORDER — FENTANYL CITRATE (PF) 100 MCG/2ML IJ SOLN
25.0000 ug | INTRAMUSCULAR | Status: DC | PRN
  Administered 2017-07-02 – 2017-07-03 (×4): 50 ug via INTRAVENOUS
  Administered 2017-07-04 – 2017-07-12 (×2): 25 ug via INTRAVENOUS
  Filled 2017-07-02 (×7): qty 2

## 2017-07-02 MED ORDER — METOPROLOL TARTRATE 50 MG PO TABS
50.0000 mg | ORAL_TABLET | Freq: Two times a day (BID) | ORAL | Status: DC
  Administered 2017-07-02: 50 mg via ORAL
  Filled 2017-07-02: qty 1

## 2017-07-02 MED ORDER — METOPROLOL TARTRATE 100 MG PO TABS
100.0000 mg | ORAL_TABLET | Freq: Two times a day (BID) | ORAL | Status: DC
  Administered 2017-07-02 – 2017-07-04 (×5): 100 mg via ORAL
  Filled 2017-07-02 (×4): qty 1
  Filled 2017-07-02 (×2): qty 2

## 2017-07-02 NOTE — Evaluation (Signed)
Speech Language Pathology Evaluation Patient Details Name: Robert Keith MRN: [id number] DOB: 1933/04/09 Today's Date: 07/02/2017 Time: 6294-7654 SLP Time Calculation (min) (ACUTE ONLY): 21 min  Problem List:  Patient Active Problem List   Diagnosis Date Noted  . Chronic subdural hematoma ([place]) 06/27/2017  . Hematoma of thigh, right, initial encounter 06/27/2017  . Cytotoxic brain edema ([place]) 06/27/2017  . Postoperative groin pseudoaneurysm ([place]) 06/27/2017  . Hemorrhagic shock ([place])   . Stroke ([place]) 06/26/2017  . Acute ischemic stroke ([place]) 06/26/2017  . Duodenal ulcer disease   . Acute blood loss anemia 09/17/2016  . Coronary artery disease due to lipid rich plaque s/p CABG 1996 09/17/2016  . Chronic atrial fibrillation ([place]) 09/17/2016  . History of subdural hematoma 09/17/2016  . Peptic ulcer disease 09/17/2016  . Type 2 diabetes mellitus with diabetic neuropathy, without long-term current use of insulin ([place]) 09/17/2016  . Essential hypertension 09/17/2016   Past Medical History:  Past Medical History:  Diagnosis Date  . Cancer [facility])    prostate  . Coronary artery disease   . Diabetes mellitus without complication ([place])   . Essential tremor   . H. pylori infection   . Hypertension   . Intracranial hemorrhage [facility])    Past Surgical History:  Past Surgical History:  Procedure Laterality Date  . brain surgery for bleed    . cardiac bypass    . CORONARY ARTERY BYPASS GRAFT    . ESOPHAGOGASTRODUODENOSCOPY (EGD) WITH PROPOFOL N/A 09/18/2016   Procedure: ESOPHAGOGASTRODUODENOSCOPY (EGD) WITH PROPOFOL;  Surgeon: [name], MD;  Location: [facility];  Service: Endoscopy;  Laterality: N/A;  . FEMORAL ARTERY EXPLORATION Right 06/27/2017   Procedure: FEMORAL ARTERY EXPLORATION;  Surgeon: Conrad [place], MD;  Location: [place];  Service: Vascular;  Laterality: Right;  . IR PERCUTANEOUS ART THROMBECTOMY/INFUSION INTRACRANIAL INC DIAG ANGIO  06/26/2017  . RADIOLOGY WITH  ANESTHESIA N/A 06/26/2017   Procedure: RADIOLOGY WITH ANESTHESIA CODE STROKE;  Surgeon: [name], MD;  Location: [place];  Service: Radiology;  Laterality: N/A;   HPI:  Pt is an 83 y.o.malewith history of prostate cancer, CAD, history of GI bleed, DM2, essential tremor, hypertension, history of atrial fibrillation and subdural hematoma s/p craniectomy and clot evacuation,who presented with right facial droop, dense right hemiplegia global aphasia, and left gaze deviation. He recevied tPA and went to IR for thrombectomy. He was intubated 7/11-7/16. Pt did have a prior MBS in September 2016 with swallowing WFL.    Assessment / Plan / Recommendation Clinical Impression  Pt appears to have a receptive/expressive aphasia that is also impacted by his reduced hearing even with hearing aids in place. Mod-Max cues were provided to follow one-step commands, and he answered all simple, biographical yes/no questions with a head nod "yes". He makes attempts to verbalize but I could only discern one clear word that was a repetition of what I had said. Otherwise his verbal output is limited and very difficult to understand, although I suspect that it is not comprised of clear words. Pt will need SLP f/u to maximize functional communication and for continued differential diagnosis of cognitive abilities.    SLP Assessment  SLP Recommendation/Assessment: Patient needs continued Speech Lanaguage Pathology Services SLP Visit Diagnosis: Aphasia (R47.01)    Follow Up Recommendations  Other (comment) (tba)    Frequency and Duration min 2x/week  2 weeks      SLP Evaluation Cognition  Overall Cognitive Status: Difficult to assess (aphasia) Arousal/Alertness:  (drowsy) Orientation Level: Disoriented  X4 Attention: Sustained Sustained Attention: Appears intact (basic, functional tasks)       Comprehension  Auditory Comprehension Overall Auditory Comprehension: Impaired Yes/No Questions:  Impaired Basic Biographical Questions: 26-50% accurate Commands: Impaired One Step Basic Commands: 25-49% accurate Interfering Components: Hearing EffectiveTechniques: Visual/Gestural cues    Expression Expression Primary Mode of Expression: Verbal Verbal Expression Overall Verbal Expression: Impaired Initiation: No impairment Level of Generative/Spontaneous Verbalization: Word Non-Verbal Means of Communication: Gestures (head nods)   Oral / Motor  Oral Motor/Sensory Function Overall Oral Motor/Sensory Function: Other (comment) (mild-mod R facial droop, difficulty following commands tho) Motor Speech Overall Motor Speech:  (also impacted by intubation) Phonation: Hoarse;Low vocal intensity Articulation: Impaired Level of Impairment: Word Intelligibility: Intelligibility reduced Word: 0-24% accurate Interfering Components: Hearing loss (post-extubation)   GO                    Robert Keith 07/02/2017, 10:25 AM  Robert Keith, M.A. CCC-SLP [phone]

## 2017-07-02 NOTE — Consult Note (Signed)
Physical Medicine and Rehabilitation Consult Reason for Consult: Right sided weakness with a global aphasia Referring Physician: Dr.Xu   HPI: Robert Keith is a 83 y.o. right handed male with history of atrial fibrillation no anticoagulation due to history of GI bleed, CVA, CAD with CABG 2017, diabetes mellitus, hypertension,  subdural hematoma with craniectomy for clot evacuation 2014 at [facility]. Per chart review patient lives alone with a 24/7 caregiver. One level home. Ambulated with a rolling walker prior to admission and driving short distances. Independent with bathing and dressing with supervision provided by caregiver. Presented 06/26/2017 with right facial droop, right-sided weakness and aphasia. CT of the head showed no vascular territory infarct identified. Small left frontal subdural hematoma stable in size as compared to 2014. Patient did receive TPA. CT cerebral perfusion scan showed a left M1 E emergent large vessel occlusion with associated acute left MCA territory infarct. Interventional radiology consulted underwent thrombectomy. Follow-up MRI showed completed acute infarct affecting the majority of the inferior division left MCA distribution. Echocardiogram with ejection fraction of 55% left Atrium severely dilated no source of emboli. Patient did require intubation and later extubated 07/02/2007. Patient remains NPO with nasogastric tube feeds. Blood culture 06/30/2017 one of 2 coag-negative staph presently on broad-spectrum antibiotics. Physical therapy evaluation completed 07/02/2017 with recommendations of physical medicine rehabilitation consult.   Review of Systems  Unable to perform ROS: Language   Past Medical History:  Diagnosis Date  . Cancer [facility]    prostate  . Coronary artery disease   . Diabetes mellitus without complication ([place])   . Essential tremor   . H. pylori infection   . Hypertension   . Intracranial hemorrhage [facility])    Past Surgical  History:  Procedure Laterality Date  . brain surgery for bleed    . cardiac bypass    . CORONARY ARTERY BYPASS GRAFT    . ESOPHAGOGASTRODUODENOSCOPY (EGD) WITH PROPOFOL N/A 09/18/2016   Procedure: ESOPHAGOGASTRODUODENOSCOPY (EGD) WITH PROPOFOL;  Surgeon: [name], MD;  Location: [place] [facility];  Service: Endoscopy;  Laterality: N/A;  . FEMORAL ARTERY EXPLORATION Right 06/27/2017   Procedure: FEMORAL ARTERY EXPLORATION;  Surgeon: Conrad Delta, MD;  Location: [place];  Service: Vascular;  Laterality: Right;  . IR PERCUTANEOUS ART THROMBECTOMY/INFUSION INTRACRANIAL INC DIAG ANGIO  06/26/2017  . RADIOLOGY WITH ANESTHESIA N/A 06/26/2017   Procedure: RADIOLOGY WITH ANESTHESIA CODE STROKE;  Surgeon: [name], MD;  Location: [place];  Service: Radiology;  Laterality: N/A;   Family History  Problem Relation Age of Onset  . Stroke Mother   . Heart attack Father    Social History:  reports that he has quit smoking. He has never used smokeless tobacco. He reports that he does not drink alcohol or use drugs. Allergies:  Allergies  Allergen Reactions  . Metformin And Related Diarrhea  . Tape Other (See Comments)    PLEASE USE COBAN WRAP; PATIENT'S SKIN IS THIN AND WILL TEAR EASILY!!  . Bupropion Other (See Comments)    Unknown  . Fenofibrate Micronized Other (See Comments)    Unknown  . Gemfibrozil Other (See Comments)    Unknown  . Niacin Other (See Comments)    Impotence   . Phenobarbital Other (See Comments)    Confusion   . Simvastatin Other (See Comments)    Myalgia   Medications Prior to Admission  Medication Sig Dispense Refill  . amLODipine (NORVASC) 5 MG tablet Take 2.5 mg by mouth 2 (two)  times daily.   3  . clotrimazole (LOTRIMIN) 1 % cream Apply 1 application topically 2 (two) times daily.   1  . furosemide (LASIX) 20 MG tablet Take 1 tablet (20 mg total) by mouth daily as needed. (Patient taking differently: Take 20 mg by mouth daily as needed for fluid or edema.  ) 30 tablet 6  . glipiZIDE (GLUCOTROL) 10 MG tablet Take 10 mg by mouth 2 (two) times daily.  3  . KLOR-CON M20 20 MEQ tablet TAKE 1 TABLET (20 MEQ TOTAL) BY MOUTH DAILY AS NEEDED. (Patient taking differently: Take 20 mEq by mouth daily as needed (for potassium). ) 30 tablet 3  . loratadine (CLARITIN) 10 MG tablet Take 10 mg by mouth daily.    Marland Kitchen MEGARED OMEGA-3 KRILL OIL PO Take 1 capsule by mouth daily.    . metoprolol (LOPRESSOR) 100 MG tablet Take 100 mg by mouth 2 (two) times daily.     . pantoprazole (PROTONIX) 40 MG tablet Take 1 tablet (40 mg total) by mouth 2 (two) times daily before a meal. (Patient taking differently: Take 40 mg by mouth daily. ) 120 tablet 0  . pioglitazone (ACTOS) 15 MG tablet Take 15 mg by mouth daily.    . pravastatin (PRAVACHOL) 40 MG tablet Take 40 mg by mouth at bedtime.     . propranolol (INDERAL) 40 MG tablet Take 40 mg by mouth 2 (two) times daily.  2    Home: Home Living Family/patient expects to be discharged to:: Private residence Living Arrangements: Alone (24/7 caregiver) Available Help at Discharge: Personal care attendant, Available 24 hours/day Type of Home: House Home Access: Level entry [place]: One level Bathroom Shower/Tub: Multimedia programmer: Standard Bathroom Accessibility: Yes Home Equipment: [place] - single point, Walker - 4 wheels Additional Comments: Was receiving HHPT and HHOT pta.  Lives With: Alone  Functional History: Prior Function Level of Independence: Needs assistance Gait / Transfers Assistance Needed: PCA present for assistance when needed. Ambulated with rollator ADL's / Homemaking Assistance Needed: Independently bathing and dressing (supervision from caregiver). Functional Status:  Mobility: Bed Mobility Overal bed mobility: Needs Assistance Bed Mobility: Rolling, Sidelying to Sit, Sit to Sidelying Rolling: +2 for physical assistance, Max assist Sidelying to sit: Total assist, +2 for physical  assistance Sit to sidelying: Total assist, +2 for physical assistance General bed mobility comments: Total assistance required to manage B LE off of and on to bed as well as at trunk to raise from elevated HOB.  Transfers Overall transfer level: Needs assistance Equipment used: 2 person hand held assist Transfers: Lateral/Scoot Transfers  Lateral/Scoot Transfers: Total assist, Max assist, +2 physical assistance General transfer comment: Max to total assist +2 for scoot toward [facility] to reposition in bed. Unable to attempt standing position.  Ambulation/Gait General Gait Details: Unable at this time    ADL: ADL Overall ADL's : Needs assistance/impaired General ADL Comments: Currently requires total assist for ADL participation.   Cognition: Cognition Overall Cognitive Status: Difficult to assess Arousal/Alertness:  (drowsy) Orientation Level: Other (comment) (global aphasia) Attention: Sustained Sustained Attention: Appears intact (basic, functional tasks) Cognition Arousal/Alertness: Awake/alert Behavior During Therapy: Flat affect Overall Cognitive Status: Difficult to assess Area of Impairment: Following commands Following Commands: Follows one step commands inconsistently General Comments: Difficult to assess due to aphasia. Pt following some commands with verbal and visual cues.  Difficult to assess due to: Impaired communication  Blood pressure 125/81, pulse (!) 37, temperature 97.8 F (36.6 C), resp. rate  18, height 5\' 9"  (1.753 m), weight 90.8 kg (200 lb 2.8 oz), SpO2 91 %. Physical Exam  HENT:  Nasogastric tube in place  Eyes:  Pupils round and reactive to light  Neck: Normal range of motion. Neck supple. No thyromegaly present.  Cardiovascular:  Cardiac rate controlled  Respiratory:  Decreased breath sounds at the bases with scattered rhonchi. Limited inspiratory effort  GI: Soft. Bowel sounds are normal. He exhibits no distension.  Neurological: He is alert.  He  does make eye contact with examiner. Patient is aphasic and remained nonverbal. Opens mouth but cannot protrude tonguel  Very inconsistent to follow one-step commands. Examination overall was limited. Resting tremor. RUE 1 to 1+/5. LUE 3/5. LLE 3- to 3/5. RLE without volitional movement. Does seem to sense gross touch RUE and to a lesser extent RLE. Babinski present RLE.  Skin: Skin is warm and dry.  Bruising/ecchymoses in right groin/thigh----drains in place    Results for orders placed or performed during the hospital encounter of 06/26/17 (from the past 24 hour(s))  Glucose, capillary     Status: Abnormal   Collection Time: 07/01/17  5:00 PM  Result Value Ref Range   Glucose-Capillary 136 (H) 65 - 99 mg/dL  Glucose, capillary     Status: Abnormal   Collection Time: 07/01/17  7:44 PM  Result Value Ref Range   Glucose-Capillary 157 (H) 65 - 99 mg/dL  Glucose, capillary     Status: Abnormal   Collection Time: 07/01/17 11:13 PM  Result Value Ref Range   Glucose-Capillary 142 (H) 65 - 99 mg/dL  Glucose, capillary     Status: Abnormal   Collection Time: 07/02/17  3:20 AM  Result Value Ref Range   Glucose-Capillary 156 (H) 65 - 99 mg/dL  Basic metabolic panel     Status: Abnormal   Collection Time: 07/02/17  4:06 AM  Result Value Ref Range   Sodium 145 135 - 145 mmol/L   Potassium 3.5 3.5 - 5.1 mmol/L   Chloride 112 (H) 101 - 111 mmol/L   CO2 24 22 - 32 mmol/L   Glucose, Bld 166 (H) 65 - 99 mg/dL   BUN 22 (H) 6 - 20 mg/dL   Creatinine, Ser 0.81 0.61 - 1.24 mg/dL   Calcium 8.0 (L) 8.9 - 10.3 mg/dL   GFR calc non Af Amer >60 >60 mL/min   GFR calc Af Amer >60 >60 mL/min   Anion gap 9 5 - 15  Magnesium     Status: None   Collection Time: 07/02/17  4:06 AM  Result Value Ref Range   Magnesium 2.2 1.7 - 2.4 mg/dL  CBC     Status: Abnormal   Collection Time: 07/02/17  4:06 AM  Result Value Ref Range   WBC 13.2 (H) 4.0 - 10.5 K/uL   RBC 3.00 (L) 4.22 - 5.81 MIL/uL   Hemoglobin 8.7 (L)  13.0 - 17.0 g/dL   HCT 27.3 (L) 39.0 - 52.0 %   MCV 91.0 78.0 - 100.0 fL   MCH 29.0 26.0 - 34.0 pg   MCHC 31.9 30.0 - 36.0 g/dL   RDW 16.6 (H) 11.5 - 15.5 %   Platelets 183 150 - 400 K/uL  Glucose, capillary     Status: Abnormal   Collection Time: 07/02/17  8:08 AM  Result Value Ref Range   Glucose-Capillary 149 (H) 65 - 99 mg/dL  Glucose, capillary     Status: Abnormal   Collection Time: 07/02/17 12:06 PM  Result  Value Ref Range   Glucose-Capillary 183 (H) 65 - 99 mg/dL   Comment 1 Notify RN    Comment 2 Document in Chart    Dg Chest Port 1 View  Result Date: 07/02/2017 CLINICAL DATA:  83 year old male with acute respiratory failure. Subsequent encounter. EXAM: PORTABLE CHEST 1 VIEW COMPARISON:  07/01/2017. FINDINGS: Endotracheal tube removed. Feeding tube remains in place with tip not imaged. Post CABG.  Cardiomegaly. Consolidation left base may represent atelectasis with pleural effusion layering posteriorly contributing to hazy appearance of left thorax. Basilar atelectasis or infiltrate not excluded. Pulmonary vascular prominence most notable centrally. Calcified aorta. Probable granuloma right upper lobe. No acute osseous abnormality. IMPRESSION: Endotracheal tube removed. Cardiomegaly post CABG. Persistent consolidation left base may represent pleural fluid with atelectasis. Pleural fluid may be layering posteriorly contributing to hazy appearance of left thorax. Pulmonary vascular prominence most notable centrally. Aortic atherosclerosis. Electronically Signed   By: Genia Del M.D.   On: 07/02/2017 07:21   Dg Chest Port 1 View  Result Date: 07/01/2017 CLINICAL DATA:  Intubation . EXAM: PORTABLE CHEST 1 VIEW COMPARISON:  06/29/2017. FINDINGS: Endotracheal tube and feeding tube in stable position. Prior CABG. Stable cardiomegaly with persistent but improved bilateral pulmonary infiltrates suggesting improving CHF. Small left pleural effusion again noted. No pneumothorax . IMPRESSION:  1. Lines and tubes in stable position. 2. Prior CABG. Cardiomegaly with improving bilateral pulmonary infiltrates suggesting improving pulmonary edema. Small left pleural effusion again noted . Electronically Signed   By: Marcello Moores  Register   On: 07/01/2017 07:05    Assessment/Plan: Diagnosis: left MCA infarct, right groin hematoma 1. Does the need for close, 24 hr/day medical supervision in concert with the patient's rehab needs make it unreasonable for this patient to be served in a less intensive setting? Yes 2. Co-Morbidities requiring supervision/potential complications: dm2, hx of tremor/gait disorder, afib, htn 3. Due to bladder management, bowel management, safety, skin/wound care, disease management, medication administration, pain management and patient education, does the patient require 24 hr/day rehab nursing? Yes 4. Does the patient require coordinated care of a physician, rehab nurse, PT (1-2 hrs/day, 5 days/week), OT (1-2 hrs/day, 5 days/week) and SLP (1-2 hrs/day, 5 days/week) to address physical and functional deficits in the context of the above medical diagnosis(es)? Yes Addressing deficits in the following areas: balance, endurance, locomotion, strength, transferring, bowel/bladder control, bathing, dressing, feeding, grooming, toileting, cognition, speech, language, swallowing and psychosocial support 5. Can the patient actively participate in an intensive therapy program of at least 3 hrs of therapy per day at least 5 days per week? Yes 6. The potential for patient to make measurable gains while on inpatient rehab is good 7. Anticipated functional outcomes upon discharge from inpatient rehab are supervision and min assist  with PT, supervision, min assist and mod assist with OT, modified independent and supervision with SLP. 8. Estimated rehab length of stay to reach the above functional goals is: 20-30 days 9. Anticipated D/C setting: Home 10. Anticipated post D/C treatments: HH  therapy and Outpatient therapy 11. Overall Rehab/Functional Prognosis: good  RECOMMENDATIONS: This patient's condition is appropriate for continued rehabilitative care in the following setting: CIR Patient has agreed to participate in recommended program. N/A Note that insurance prior authorization may be required for reimbursement for recommended care.  Comment: Pt had caregiver PTA but was independent with ambulation, toileting, self-care/hygiene, dressing, etc using a RW. Rehab Admissions Coordinator to follow up.  Thanks,  Meredith Staggers, MD, FAAPMR    Cathlyn Parsons.,  PA-C 07/02/2017

## 2017-07-02 NOTE — Progress Notes (Addendum)
[facility]  Subjective  - Extubated, no new complaints.  Follows commands.   Objective 121/73 (!) 108 98.9 F (37.2 C) (Axillary) 17 100%  Intake/Output Summary (Last 24 hours) at 07/02/17 0940 Last data filed at 07/02/17 0902  Gross per 24 hour  Intake             1520 ml  Output             4790 ml  Net            -3270 ml    Palpable Dp B Right groin with ecchymosis, incision healing well.  Skin tears in groin crease.  Compartments soft. Heart IRIR Awake and follow command.  Assessment/Planning: right groin exploration and primary arterial repair with drain placement  5 Days Post-Op  Drain out put 164 cc last 24 hours.  Will maintain drains. Xeroform to skin tears. Staples intact   Laurence Slate [facility] 07/02/2017 9:40 AM   Addendum  Agree.  Continue drains for now.  Hopefully out tomorrow.  Skin blistering was present prior to exploration from tense large hematoma.  Manage skin breakdown symptomatically: Xeroform vs Aquacel Ag.    - staples out in 10-14 days - continue drains   Adele Barthel, MD, FACS [facility] Office: [phone] Pager: [phone]  07/02/2017, 10:44 AM   --  Laboratory Lab Results:  Recent Labs  07/01/17 0240 07/02/17 0406  WBC 11.9* 13.2*  HGB 7.8* 8.7*  HCT 23.5* 27.3*  PLT 127* 183   BMET  Recent Labs  07/01/17 0240 07/02/17 0406  NA 145 145  K 3.2* 3.5  CL 112* 112*  CO2 27 24  GLUCOSE 173* 166*  BUN 20 22*  CREATININE 0.91 0.81  CALCIUM 7.6* 8.0*    COAG Lab Results  Component Value Date   INR 1.59 06/27/2017   INR 1.67 06/27/2017   INR 1.13 06/26/2017   No results found for: PTT

## 2017-07-02 NOTE — Evaluation (Signed)
Occupational Therapy Evaluation Patient Details Name: Robert Keith MRN: [id number] DOB: [birth year] Today's Date: 07/02/2017    History of Present Illness Pt is an 83 y.o. male who presented to the ED with global aphasia, R hemiplegia, and L gaze deviation. Pt with L M1 occlusion s/p tPA and mechanical thrombectomy with revascularization. Pt has a history of prostate cancer, CAD, history of GI bleed, DM2, essential tremor, hypertension, history of atrial fibrillation and subdural hematoma s/p craniectomy and clot evaluation. He was intubated 7/11-7/16. Pt now with significant R thigh hematoma with R femoral pseudoaneurysm following sheath removal.    Clinical Impression   PTA, pt was able to complete basic ADL with supervision from personal care attendant and was completing functional mobility with RW. Pt currently requires total assistance to participate in ADL tasks and was able to sit at EOB with and complete lateral scoot transfers max+2 assist for repositioning. Pt presents with generalized weakness (R>L), decreased attention to L visual field, and communication deficits limiting his ability to participate in ADL at [facility]. Pt would benefit from rehabilitation at CIR level post-acute D/C if able to demonstrate improved activity tolerance. OT will continue to follow while admitted.    Follow Up Recommendations  CIR;Supervision/Assistance - 24 hour    Equipment Recommendations  Other (comment) (TBD at next venue of care)    Recommendations for Other Services       Precautions / Restrictions Precautions Precautions: Fall Precaution Comments: JP drain R thigh Restrictions Weight Bearing Restrictions: No      Mobility Bed Mobility Overal bed mobility: Needs Assistance Bed Mobility: Rolling;Sidelying to Sit;Sit to Sidelying Rolling: +2 for physical assistance;Max assist Sidelying to sit: Total assist;+2 for physical assistance     Sit to sidelying: Total assist;+2 for physical  assistance General bed mobility comments: Total assistance required to manage B LE off of and on to bed as well as at trunk to raise from elevated HOB.   Transfers Overall transfer level: Needs assistance Equipment used: 2 person hand held assist Transfers: Lateral/Scoot Transfers          Lateral/Scoot Transfers: Total assist;Max assist;+2 physical assistance General transfer comment: Max to total assist +2 for scoot toward [facility] to reposition in bed. Unable to attempt standing position.     Balance Overall balance assessment: Needs assistance Sitting-balance support: Bilateral upper extremity supported;Feet supported;No upper extremity supported Sitting balance-Leahy Scale: Poor Sitting balance - Comments: Fluctuating from total to mod assist to maintain sitting balance. Pt with significant posterior lean and pushing posteriorly. Postural control: Posterior lean                                 ADL either performed or assessed with clinical judgement   ADL Overall ADL's : Needs assistance/impaired                                       General ADL Comments: Currently requires total assist for ADL participation.      Vision Baseline Vision/History: Wears glasses Wears Glasses: At all times Vision Assessment?: Vision impaired- to be further tested in functional context Additional Comments: Noted L gaze preference with head turned to R. Able to cross midline with verbal and tactile cues.      Perception     Praxis      Pertinent Vitals/Pain Pain Assessment:  Faces Faces Pain Scale: Hurts a little bit Pain Location: Generalized grimacing with mobility.  Pain Descriptors / Indicators: Grimacing Pain Intervention(s): Limited activity within patient's tolerance;Monitored during session;Repositioned     Hand Dominance     Extremity/Trunk Assessment Upper Extremity Assessment Upper Extremity Assessment: RUE deficits/detail;LUE  deficits/detail RUE Deficits / Details: Limited assessment due to impaired communication. Able to demonstrate 2/5 grasp strength.  LUE Deficits / Details: Generalized weakness 4/5 grossly.    Lower Extremity Assessment Lower Extremity Assessment: Generalized weakness;RLE deficits/detail (Unable to MMT due to cognition) RLE Deficits / Details: Noted minimal active movement throughout session - was attempting to initiate movement to EOB however required max assist to complete. LLE was able to attempt advancing to EOB with mod -max assist to complete.    Cervical / Trunk Assessment Cervical / Trunk Assessment:  (significant forward head posture noted in sitting)   Communication     Cognition Arousal/Alertness: Awake/alert Behavior During Therapy: Flat affect Overall Cognitive Status: Difficult to assess Area of Impairment: Following commands                       Following Commands: Follows one step commands inconsistently       General Comments: Difficult to assess due to aphasia. Pt following some commands with verbal and visual cues.    General Comments  HR up to 142 with 20-30 second runs (x2) of V-tach with RN aware.     Exercises     Shoulder Instructions      Home Living Family/patient expects to be discharged to:: Private residence Living Arrangements: Alone (24/7 caregiver) Available Help at Discharge: Personal care attendant;Available 24 hours/day Type of Home: House Home Access: Level entry     Home Layout: One level     Bathroom Shower/Tub: Occupational psychologist: Standard Bathroom Accessibility: Yes   Home Equipment: Cane - single point;Walker - 4 wheels   Additional Comments: Was receiving HHPT and HHOT pta.  Lives With: Alone    Prior Functioning/Environment Level of Independence: Needs assistance  Gait / Transfers Assistance Needed: PCA present for assistance when needed. Ambulated with rollator ADL's / Homemaking Assistance  Needed: Independently bathing and dressing (supervision from caregiver).            OT Problem List: Decreased strength;Decreased activity tolerance;Decreased range of motion;Impaired balance (sitting and/or standing);Decreased safety awareness;Decreased knowledge of use of DME or AE;Decreased knowledge of precautions;Pain;Impaired UE functional use      OT Treatment/Interventions: Self-care/ADL training;Therapeutic exercise;DME and/or AE instruction;Energy conservation;Neuromuscular education;Therapeutic activities;Cognitive remediation/compensation;Visual/perceptual remediation/compensation;Patient/family education;Balance training;Splinting    OT Goals(Current goals can be found in the care plan section) Acute Rehab OT Goals Patient Stated Goal: unable to state OT Goal Formulation: Patient unable to participate in goal setting Time For Goal Achievement: 07/16/17 Potential to Achieve Goals: Good ADL Goals Pt Will Perform Grooming: with mod assist;sitting Additional ADL Goal #1: Pt will complete bed mobility in preparation for ADL participation with overall moderate assistance.  Additional ADL Goal #2: Pt will attend to R visual field with no more than 1 VC during seated grooming tasks.  Additional ADL Goal #3: Pt will tolerate sitting at EOB during ADL participation with overall min assist for balance and stable vital signs.   OT Frequency: Min 3X/week   Barriers to D/C:            Co-evaluation PT/OT/SLP Co-Evaluation/Treatment: Yes Reason for Co-Treatment: Complexity of the patient's impairments (multi-system involvement);Necessary to address cognition/behavior  during functional activity;For patient/therapist safety;To address functional/ADL transfers PT goals addressed during session: Mobility/safety with mobility;Balance OT goals addressed during session: ADL's and self-care      AM-PAC PT "6 Clicks" Daily Activity     Outcome Measure Help from another person eating  meals?: Total Help from another person taking care of personal grooming?: Total Help from another person toileting, which includes using toliet, bedpan, or urinal?: Total Help from another person bathing (including washing, rinsing, drying)?: Total Help from another person to put on and taking off regular upper body clothing?: Total Help from another person to put on and taking off regular lower body clothing?: Total 6 Click Score: 6   End of Session Equipment Utilized During Treatment: Gait belt Nurse Communication: Mobility status  Activity Tolerance: Patient tolerated treatment well Patient left: in bed;with call bell/phone within reach (with L mitt reapplied)  OT Visit Diagnosis: Other abnormalities of gait and mobility (R26.89);Hemiplegia and hemiparesis;Cognitive communication deficit (R41.841) Symptoms and signs involving cognitive functions: Cerebral infarction Hemiplegia - Right/Left: Right Hemiplegia - caused by: Cerebral infarction                Time: 5621-3086 OT Time Calculation (min): 42 min Charges:  OT General Charges $OT Visit: 1 Procedure OT Evaluation $OT Eval Moderate Complexity: 1 Procedure G-Codes:     Norman Herrlich, MS OTR/L  Pager: [place] 07/02/2017, 1:33 PM

## 2017-07-02 NOTE — Progress Notes (Signed)
Rehab Admissions Coordinator Note:  Patient was screened by Retta Diones for appropriateness for an Inpatient Acute Rehab Consult.  At this time, we are recommending Inpatient Rehab consult.  Retta Diones 07/02/2017, 1:50 PM  I can be reached at [phone].

## 2017-07-02 NOTE — Progress Notes (Signed)
Nutrition Follow-up  INTERVENTION:   D/C Vital High Protein  Osmolite 1.2 @ 60 ml/hr (1440 ml/day) 30 ml Prostat BID Provides: 1928 kcal, 109 grams protein, and 1180 ml free water.   Consider additional free water: 200 ml every 6 hours    NUTRITION DIAGNOSIS:   Inadequate oral intake related to inability to eat as evidenced by NPO status. Ongoing.   GOAL:   Patient will meet greater than or equal to 90% of their needs Progressing.   MONITOR:   TF tolerance, Weight trends, I & O's, Diet advancement  ASSESSMENT:   83 year old male with past medical history of afib (not on NOAC 2/2 GI bleed), HTN, prior SDH in 2014 that required evacuation with residual small chronic SDH), DM, CAD, tremor, H. Pylori infection, and prostate cancer who presented 7/11 from home with witnessed onset of right facial droop, right hemiplegia and aphasia at 1330.   7/13 Cortrak placed, tip in stomach 7/16 extubated 7/17 failed swallow eval  Pt with JP drain for pseudoaneurysm with massive thigh hematoma s/p IR. JP with 165 ml out x 24 hours  Increased UOP with diuretics. UOP 4100 ml x 24 hours  CBG's: [id number]  Diet Order:  Diet NPO time specified  Skin:   (closed rt groin incision)  Last BM:  PTA  Height:   Ht Readings from Last 1 Encounters:  06/26/17 5\' 9"  (1.753 m)    Weight:   Wt Readings from Last 1 Encounters:  07/02/17 200 lb 2.8 oz (90.8 kg)  Admission weight: 189 lb (86 kg)  Ideal Body Weight:  72.7 kg  BMI:  Body mass index is 29.56 kg/m.  Estimated Nutritional Needs:   Kcal:  1900-2100  Protein:  105-115 grams  Fluid:  1.9-2.1 L  EDUCATION NEEDS:   No education needs identified at this time  [place], [place], [place] Pager [phone] After Hours Pager

## 2017-07-02 NOTE — Evaluation (Signed)
Clinical/Bedside Swallow Evaluation Patient Details  Name: Robert Keith MRN: [id number] Date of Birth: March 21, 1933  Today's Date: 07/02/2017 Time: SLP Start Time (ACUTE ONLY): 4709 SLP Stop Time (ACUTE ONLY): 0930 SLP Time Calculation (min) (ACUTE ONLY): 12 min  Past Medical History:  Past Medical History:  Diagnosis Date  . Cancer [facility])    prostate  . Coronary artery disease   . Diabetes mellitus without complication ([place])   . Essential tremor   . H. pylori infection   . Hypertension   . Intracranial hemorrhage [facility])    Past Surgical History:  Past Surgical History:  Procedure Laterality Date  . brain surgery for bleed    . cardiac bypass    . CORONARY ARTERY BYPASS GRAFT    . ESOPHAGOGASTRODUODENOSCOPY (EGD) WITH PROPOFOL N/A 09/18/2016   Procedure: ESOPHAGOGASTRODUODENOSCOPY (EGD) WITH PROPOFOL;  Surgeon: [name], MD;  Location: [place] [facility];  Service: Endoscopy;  Laterality: N/A;  . FEMORAL ARTERY EXPLORATION Right 06/27/2017   Procedure: FEMORAL ARTERY EXPLORATION;  Surgeon: Conrad Juntura, MD;  Location: [place];  Service: Vascular;  Laterality: Right;  . IR PERCUTANEOUS ART THROMBECTOMY/INFUSION INTRACRANIAL INC DIAG ANGIO  06/26/2017  . RADIOLOGY WITH ANESTHESIA N/A 06/26/2017   Procedure: RADIOLOGY WITH ANESTHESIA CODE STROKE;  Surgeon: [name], MD;  Location: [place];  Service: Radiology;  Laterality: N/A;   HPI:  Pt is an 83 y.o.malewith history of prostate cancer, CAD, history of GI bleed, DM2, essential tremor, hypertension, history of atrial fibrillation and subdural hematoma s/p craniectomy and clot evacuation,who presented with right facial droop, dense right hemiplegia global aphasia, and left gaze deviation. He recevied tPA and went to IR for thrombectomy. He was intubated 7/11-7/16. Pt did have a prior MBS in September 2016 with swallowing WFL.    Assessment / Plan / Recommendation Clinical Impression  Pt has signs of suspected laryngeal  irritation s/p prolonged intubation, including low, hoarse vocal quality. His oral phase appears appropriate with POs given, although he does not trigger a pharyngeal swallow with ice chips. SLP provided small amounts of water for increased stimulation with 4-5 subswallows observed per bolus, followed by coughing and wet vocal quality that is concerning for aspiration. Suspect that at least some of his dysphagia is secondary to his intubation with good prognosis for improvement over the next few days; however, he does also have R sided facial droop with possible neurological involvement as well. Recommend to remain NPO for today with continued use of alternative nutrition. Will f/u on next date for continued diagnostic PO trials. SLP Visit Diagnosis: Dysphagia, unspecified (R13.10)    Aspiration Risk  Severe aspiration risk    Diet Recommendation NPO;Alternative means - temporary   Medication Administration: Via alternative means    Other  Recommendations Oral Care Recommendations: Oral care QID Other Recommendations: Have oral suction available   Follow up Recommendations  (tba)      Frequency and Duration min 2x/week  2 weeks       Prognosis Prognosis for Safe Diet Advancement: Good Barriers to Reach Goals: Language deficits      Swallow Study   General HPI: Pt is an 83 y.o.malewith history of prostate cancer, CAD, history of GI bleed, DM2, essential tremor, hypertension, history of atrial fibrillation and subdural hematoma s/p craniectomy and clot evacuation,who presented with right facial droop, dense right hemiplegia global aphasia, and left gaze deviation. He recevied tPA and went to IR for thrombectomy. He was intubated 7/11-7/16. Pt did have a prior MBS  in September 2016 with swallowing WFL.  Type of Study: Bedside Swallow Evaluation Previous Swallow Assessment: see HPI Diet Prior to this Study: NPO;NG Tube Temperature Spikes Noted: No Respiratory Status: Nasal  cannula History of Recent Intubation: Yes Length of Intubations (days): 6 days Date extubated: 07/01/17 Behavior/Cognition: Alert;Cooperative;Requires cueing Oral Cavity Assessment: Within Functional Limits Oral Care Completed by SLP: Recent completion by staff Oral Cavity - Dentition: Adequate natural dentition Self-Feeding Abilities: Needs assist Patient Positioning: Upright in bed Baseline Vocal Quality: Low vocal intensity Volitional Cough: Cognitively unable to elicit Volitional Swallow: Unable to elicit    Oral/Motor/Sensory Function Overall Oral Motor/Sensory Function: Other (comment) (mild-mod R facial droop, difficulty following commands tho)   Ice Chips Ice chips: Impaired Presentation: Spoon Pharyngeal Phase Impairments: Unable to trigger swallow   Thin Liquid Thin Liquid: Impaired Presentation: Spoon Pharyngeal  Phase Impairments: Cough - Immediate;Wet Vocal Quality;Cough - Delayed    Nectar Thick Nectar Thick Liquid: Not tested   Honey Thick Honey Thick Liquid: Not tested   Puree Puree: Not tested   Solid   GO   Solid: Not tested        Germain Osgood 07/02/2017,10:18 AM   Germain Osgood, M.A. CCC-SLP [phone]

## 2017-07-02 NOTE — Progress Notes (Signed)
PULMONARY / CRITICAL CARE MEDICINE   Name: Robert Keith MRN: [id number] DOB: [birth year]    ADMISSION DATE:  06/26/2017 CONSULTATION DATE:  06/26/17  REFERRING MD:  Dr. Leonie Man  CHIEF COMPLAINT:  CVA  HISTORY OF PRESENT ILLNESS:   83 year old man  presented 7/11 from home with witnessed onset of right facial droop, right hemiplegia and aphasia .  CT head showed a hyperdense left middle cerebral artery and chronic small left frontal subdural (unchanged from prior in 2014).  TPA was given   CTA showed left M1 occlusion and therefore patient taken to neuro IR for mechanical thrombectomy and revascularization.   past medical history of afib (not on NOAC 2/2 GI bleed), HTN, prior SDH in 2014 that required evacuation with residual small chronic SDH), DM, CAD, tremor, H. Pylori infection, and prostate cancer         STUDIES:  7/11 Head CT >> focal hyperdensity of left distal M1 may represent thrombus, no vascular territory infarct indentified, small left frontal SDH stable in size and attenuation from 2014, ASPECTS is 10 7/11 CTA head and neck>> emergent large vessel occlusion of left M1 segment, some reconstitution of anterior left MCA branch vessels from pial collaterals 7/12 CT abd >> hematoma in the anterior RIGHT thigh measuring 5.6 x 5.3 cm 7/12 MRI >> evolved lt MCA infarct  CULTURES: Respiratory 7/15 >> few GNR, few GPC >>  Urine 7/15 >> negative Blood 7/15 >> 1 of 2 coag-negative staph >>   ANTIBIOTICS: 7/11 Ancef (pre-op) ceftaz (empiric) 7/14 >>  vanco (empiric) 7/14 >> 7/17  SIGNIFICANT EVENTS: 7/11  Admit  7/11 right femoral sheath found to be kinked, TPA reversal deemed to risky. Sheath pulled in PACU.  7/12 >> emergent OR for left groin hematoma , profunda femoral repaired  LINES/TUBES: 7/11 ETT >> 7/16 7/11 OGT>> 7/16 7/11 Left Radial Aline >> 7/15 7/11 Right Femoral Sheath > 7/11  7/11 R groin JP drain >>>   SUBJECTIVE:  Extubated, tolerating. He isn't really  talking to me. But his family bedside reports that he has complained of chronic back pain, and pain related to foley.    VITAL SIGNS: BP 135/82 (BP Location: Left Arm)   Pulse 82   Temp 98.9 F (37.2 C) (Axillary)   Resp 17   Ht 5\' 9"  (1.753 m)   Wt 90.8 kg (200 lb 2.8 oz)   SpO2 100%   BMI 29.56 kg/m   HEMODYNAMICS:    VENTILATOR SETTINGS:    INTAKE / OUTPUT: I/O last 3 completed shifts: In: 1970 [NG/GT:720; IV Piggyback:1250] Out: 5505 [Urine:5200; Emesis/NG output:10; Drains:295]  PHYSICAL EXAMINATION: General: Elderly man, resting in bed, no distress HEENT: PERRL, no JVD.  Neuro: Awake, alert. Follows.  CV: IRIR, tachy to 110 at max. No MRG PULM: diminished GI: Soft, benign, positive bowel sounds Extremities: JP drain remains in right thigh wound with serosanguineous drainage.  Skin: No rash   LABS:  BMET  Recent Labs Lab 06/30/17 0445 07/01/17 0240 07/02/17 0406  NA 141 145 145  K 3.3* 3.2* 3.5  CL 111 112* 112*  CO2 22 27 24   BUN 17 20 22*  CREATININE 1.04 0.91 0.81  GLUCOSE 218* 173* 166*    Electrolytes  Recent Labs Lab 06/27/17 0314  06/29/17 0400  06/30/17 0445 07/01/17 0240 07/02/17 0406  CALCIUM 7.4*  < > 7.4*  < > 7.3* 7.6* 8.0*  MG 1.7  --  1.7  --   --   --  2.2  PHOS 3.2  --  2.1*  --   --   --   --   < > = values in this interval not displayed.  CBC  Recent Labs Lab 06/30/17 0445 07/01/17 0240 07/02/17 0406  WBC 13.1* 11.9* 13.2*  HGB 7.9* 7.8* 8.7*  HCT 23.2* 23.5* 27.3*  PLT 96* 127* 183    Coag's  Recent Labs Lab 06/26/17 1420 06/27/17 1515 06/27/17 1828  APTT 29 31 31   INR 1.13 1.67 1.59    Sepsis Markers  Recent Labs Lab 06/29/17 1734 06/29/17 2045 06/30/17 0005 06/30/17 0445 07/01/17 0240  LATICACIDVEN 2.4* 2.8*  --  3.1*  --   PROCALCITON  --   --  5.57 6.30 5.47    ABG  Recent Labs Lab 06/27/17 1719 06/29/17 1701 06/30/17 0010  PHART 7.234* 7.423 7.475*  PCO2ART 45.1 25.2* 29.9*   PO2ART 474.0* 54.0* 65.2*    Liver Enzymes  Recent Labs Lab 06/26/17 1420  AST 34  ALT 33  ALKPHOS 88  BILITOT 0.9  ALBUMIN 3.6    Cardiac Enzymes  Recent Labs Lab 06/27/17 1355  TROPONINI <0.03    Glucose  Recent Labs Lab 07/01/17 1218 07/01/17 1700 07/01/17 1944 07/01/17 2313 07/02/17 0320 07/02/17 0808  GLUCAP 166* 136* 157* 142* 156* 149*     DISCUSSION: 9 yoM with PMH of Afib- not on coag  With Left MCA-CVA s/p neuro IR. Right femoral pseudoaneurysm and thigh hematoma following sheath removal. Complicated by hemorrhagic shock, resolved. Extubated 7/16 and tolerating well.  ASSESSMENT / PLAN:  PULMONARY A: Post op resp failure, VDRF (resolved) B infiltrate 7/14, ? Edema v TRALI, consider PNA esp w fever 7/14 pm > improved 7/16 w diuresis P:   Based on improvement in chest x-ray suspect that the lateral perihilar infiltrates were pulmonary edema (consider TRALI) as opposed to infection.  NTS as needed Based on patient's stated wishes, goals for care and on discussions with family he will not be reintubated if he were to decline after extubation.  Aspiration risk, SLP to see.   CARDIOVASCULAR A:  Hemorrhagic shock, consider also component septic shock (resolved) Afib - not on anticoagulation 2/2 previous GI bleed (now with RVR 7/17 am) Hx CAD, HTN  Echo 09/2016 >> RVSP 50, mild AS P:  Telemetry monitoring Metoprolol increase to 50 mg twice a day on 7/15 PRN metoprolol for HR > 120 No anticoagulation   RENAL A:   Hypocalcemia Hypokalemia Hypokalemia AKI related to hypotension, improving P:   Hold diuresis Follow BMP, urine output  Replace electrolytes as indicated DC foley, in favor of condom cath.   GASTROINTESTINAL A:   Nutrition Needs  P:   TF per protocol SLP eval pending. PPI prophylaxis  HEMATOLOGIC A:   Anemia- mild , chronic + acute blood loss Rt thigh hematoma s/p profunda repair, drains P:  Follow CBC daily now that  some stability has been achieved.  Follow right thigh drain output, removal per vascular recommendations SCD in place  INFECTIOUS:  A: Bilateral pulmonary infiltrates, HCAP possible, less likely given improvement with diuresis 1 of 2 coag-negative staph, significance unclear, suspect contaminant P:  Empiric ceftaz edema and vancomycin added on 7/14 Will narrow to Ceftaz alone Continue to follow cultures.  ENDOCRINE A:   DM   P:   Sliding scale insulin and CBG per protocol  NEUROLOGIC A:   Left M1 occlusion s/p TPA and mechanical thrombectomy w/revascularization  - with right hemiplegia, global aphasia and  left gaze   Chronic small left frontal SDH from prior SDH in 2014 that required  Hx prior SDH in 2014 requiring craniotomy  P:   RASS goal: 0 Minimize sedating medications Further management per neurology plans   FAMILY  - Updates: Updated family at bedside 7/17  - Inter-disciplinary family meet or Palliative Care meeting due by:  Done 7/12  Ready for SDU transfer from [place] standpoint.  Georgann Housekeeper, AGACNP-BC [place] [facility] Pager [phone] or [phone]  07/02/2017 9:08 AM  Attending Note:  I have examined patient, reviewed labs, studies and notes. I have discussed the case with Jaclynn Guarneri, and I agree with the data and plans as amended above. 83 year old man with a left middle cerebral artery CVA for which she received TPA and then went for mechanical thrombectomy in interventional radiology. The procedure was complicated by a left femoral pseudoaneurysm and hematoma. This was repaired by vascular surgery and he still has a drain in place. Extubation was delayed due to weakness, slowly improving mental status and also pulmonary for traits possibly consistent with edema versus HCAP. He was able to be extubated on 7/16. He required NT suctioning wants overnight. On evaluation today he is weak, he does try to phonate, he follows commands. I do not  hear any upper airway secretions or stridor currently. Lungs are distant but clear, decreased at both bases. Heart irregular and tachycardic. Abdomen benign. We will continue to push pulmonary hygiene. NT suction as needed, hopefully minimize. He may have his right thigh hematoma drain removed today. We will try to supervise antibiotics as I am most suspicious that his pulmonary infiltrates were due to edema. Coag-negative staph on blood culture is likely contaminant. We will discontinue vancomycin, narrow to ceftaz. He should be able to go out to stepdown on 7/17 if no barriers identified by Dr Erlinda Hong. We wil sign off. Please call if we can help.    Baltazar Apo, MD, PhD 07/02/2017, 10:35 AM [facility] [phone] or if no answer [phone]

## 2017-07-02 NOTE — Progress Notes (Signed)
STROKE TEAM PROGRESS NOTE   SUBJECTIVE (INTERVAL HISTORY) His daughters and son are at the bedside. Pt extubated, afebrile, BP improved. More awake and alert. Hb improved also. Will transfer to step down. VVS would like continue drain for today.   OBJECTIVE Temp:  [97.8 F (36.6 C)-98.9 F (37.2 C)] 97.8 F (36.6 C) (07/17 0800) Pulse Rate:  [29-125] 37 (07/17 1200) Cardiac Rhythm: Atrial fibrillation (07/17 0800) Resp:  [14-39] 18 (07/17 1200) BP: (88-147)/(59-93) 125/81 (07/17 1200) SpO2:  [91 %-100 %] 91 % (07/17 1200) Weight:  [200 lb 2.8 oz (90.8 kg)] 200 lb 2.8 oz (90.8 kg) (07/17 0300)  CBC:  Recent Labs Lab 06/26/17 1420  06/27/17 0314  07/01/17 0240 07/02/17 0406  WBC 5.9  --  11.1*  < > 11.9* 13.2*  NEUTROABS 3.1  --  9.4*  --   --   --   HGB 12.3*  < > 8.8*  < > 7.8* 8.7*  HCT 37.9*  < > 26.9*  < > 23.5* 27.3*  MCV 92.2  --  92.1  < > 88.7 91.0  PLT 195  --  153  < > 127* 183  < > = values in this interval not displayed.  Basic Metabolic Panel:  Recent Labs Lab 06/27/17 0314  06/29/17 0400  07/01/17 0240 07/02/17 0406  NA 139  < > 141  < > 145 145  K 3.9  < > 3.4*  < > 3.2* 3.5  CL 114*  < > 111  < > 112* 112*  CO2 20*  < > 23  < > 27 24  GLUCOSE 165*  < > 184*  < > 173* 166*  BUN 16  < > 16  < > 20 22*  CREATININE 0.89  < > 0.97  < > 0.91 0.81  CALCIUM 7.4*  < > 7.4*  < > 7.6* 8.0*  MG 1.7  --  1.7  --   --  2.2  PHOS 3.2  --  2.1*  --   --   --   < > = values in this interval not displayed.  Lipid Panel:     Component Value Date/Time   CHOL 115 06/27/2017 0500   TRIG 86 06/27/2017 0500   HDL 20 (L) 06/27/2017 0500   CHOLHDL 5.8 06/27/2017 0500   VLDL 17 06/27/2017 0500   LDLCALC 78 06/27/2017 0500   HgbA1c:  Lab Results  Component Value Date   HGBA1C 6.8 (H) 06/27/2017   Urine Drug Screen: No results found for: LABOPIA, COCAINSCRNUR, LABBENZ, AMPHETMU, THCU, LABBARB  Alcohol Level No results found for: [place] I have personally  reviewed the radiological images below and agree with the radiology interpretations.  Ct Abdomen Pelvis Wo Contrast 06/27/2017 1. Hematoma within the proximal RIGHT anterior thigh is incompletely imaged. Hematoma likely spontaneous related to anticoagulation. Recommend clinical correlation with extent of hematoma in the RIGHT thigh.  2. No intraperitoneal or retroperitoneal hemorrhage.  3. Extensive diverticulosis without diverticulitis.  4.  Aortic Atherosclerosis (ICD10-I70.0).  5. Small bilateral pleural effusions.    Ct Angio Head and neck W and Wo Contrast 06/26/2017 1. Emergent large vessel occlusion of the left M1 segment.  2. There is some reconstitution of anterior left MCA branch vessels from pial collaterals.  3. Dense calcifications at the carotid bifurcations bilaterally without significant stenoses relative to the more distal vessels.  4. Moderate narrowing of the proximal posterior cerebral arteries bilaterally, right greater than left.  5.  Calcifications at great vessel origins along the arch without significant stenosis. 6. Hypoplastic left vertebral artery.  7. Multilevel cervical spinal spondylosis.  8. Emphysema.  9. Prior granulomatous disease in the right upper lobe.  Cerebral aneurysm and thrombectomy - Dr. Estanislado Pandy 06/26/2017 Status post endovascular complete revascularization of occluded left middle cerebral artery with 1 pass with Solitaire 4 mm x 40 mm FR retrieval device and a 6 [place] intermediary guide catheter achieving a TICI 3 reperfusion.  Mr Brain Wo Contrast 06/27/2017 Completed acute infarction affecting the majority of the inferior division left MCA distribution. This affects the left temporal lobe and posterior frontal lobe with patchy involvement in the parietal region.  Infarction of the caudate and putamen as well.  Petechial blood product deposition without focal hematoma.  Swelling but no shift at this time.  Ct Cerebral Perfusion W  Contrast 06/26/2017 Left M1 emergent large vessel occlusion with associated acute left MCA territory infarct.  Core infarct is estimated at 49 mL.  The tissue at risk is estimated at 182 mL.   Ct Head Code Stroke W/o Cm 06/26/2017 1. Focal hyperdensity in left distal M1 may represent thrombus.  2. No vascular territory infarct identified at this time.  3. Small left frontal subdural hematoma is stable in size and attenuation from 2014.  4. ASPECTS is 10   06/29/2017 1. Evolving large LEFT MCA territory infarct (LEFT basal ganglia, LEFT temporoparietal lobes) with petechial hemorrhage, no hemorrhagic conversion.  2. Acute small volume LEFT cerebrum extra-axial blood products.  3. Chronic changes include old small LEFT frontal subdural hematoma and small LEFT frontal encephalomalacia.    Transthoracic echocardiogram 06/28/2017 Study Conclusions - Left ventricle: The cavity size was normal. There was mild   concentric hypertrophy. Systolic function was normal. The    estimated ejection fraction was in the range of 50% to 55%.   Hypokinesis of the apical inferior and akinesis of the basal to   mid inferior myocardium. Doppler parameters are consistent with   indeterminate ventricular filling pressure. - Aortic valve: Valve mobility was restricted. There was mild   stenosis. There was mild regurgitation. Peak velocity (S): 300   cm/s. Mean gradient (S): 19 mm Hg. Valve area (VTI): 1.64 cm^2.   Valve area (Vmax): 1.49 cm^2. Valve area (Vmean): 1.39 cm^2.   Regurgitation pressure half-time: 723 ms. - Mitral valve: Transvalvular velocity was within the normal range.   There was no evidence for stenosis. There was no regurgitation. - Left atrium: The atrium was severely dilated. - Right ventricle: The cavity size was normal. Wall thickness was   normal. Systolic function was normal. - Tricuspid valve: There was mild regurgitation. - Pulmonary arteries: Systolic pressure was within the  normal   range. PA peak pressure: 27 mm Hg (S)   PHYSICAL EXAM  Temp:  [97.8 F (36.6 C)-98.9 F (37.2 C)] 97.8 F (36.6 C) (07/17 0800) Pulse Rate:  [29-125] 37 (07/17 1200) Resp:  [14-39] 18 (07/17 1200) BP: (88-147)/(59-93) 125/81 (07/17 1200) SpO2:  [91 %-100 %] 91 % (07/17 1200) Weight:  [200 lb 2.8 oz (90.8 kg)] 200 lb 2.8 oz (90.8 kg) (07/17 0300)  General - Well nourished, well developed, lethargic.  Ophthalmologic - Fundi not visualized due to noncooperation.  Cardiovascular - irregularly irregular heart rate and rhythm with RVR.  Extremities - right groin JP drain, would dress dry and clean.   Neuro - extubated, lethargic, but eyes open, not following commands except "close eyes", eye left gaze preference, barely  cross midline, blinking to visual threat on the left but not on the right. PERRL. Right facial droop, tongue midline in mouth. Spontaneous movement of LUE and LLE, at least 3/5 LUE and 2/5 LLE. Hemiplegic on the right, only trace withdraw on pain stimulation. DTR 1+ and right positive babinski. Sensation, coordination and gait not tested.    ASSESSMENT/PLAN Mr. HAN VEJAR is a 83 y.o. male with history of prostate cancer, CAD, history of GI bleed, DM2, essential tremor, hypertension, history of atrial fibrillation and subdural hematoma, s/p craniectomy and clot evacuation who presented with right facial droop, dense right hemiplegia global aphasia, and left gaze deviation. He recevied tPA at 1507 on 06/26/2017.  CTA Occluded left M1 -> IR thrombectomy TICI 3 reperfusion with Dr. Estanislado Pandy.  Stroke: left MCA infarct due to left M1 occlusion s/p thrombectomy due to afib not on AC  Resultant  aphasia and right hemiplegia  CT head:  Focal hyperdensity in left distal M1 may represent thrombus. Small left frontal subdural hematoma is stable in size and attenuation from 2014.   MRI head: left MCA infarct. Petechial blood product deposition without focal  hematoma.  CT Angio Head: Emergent large vessel occlusion of the left M1 segment. Dense calcifications at the carotid bifurcations bilaterally without significant stenoses  2D Echo - EF 50-55%. Left atrium severely dilated. No cardiac source of emboli identified.  LDL 78  HgbA1c 6.8  SCDs for VTE prophylaxis Diet NPO time specified  No antithrombotic prior to admission, now on No antithrombotic. Pt not on AC at baseline due to previous SDH s/p crani and evacuation. Currently not candidate for [facility] due to large size of stroke, large groin hematoma s/p evacuation and dropping H&H.  Patient counseled to be compliant with his antithrombotic medications  Ongoing aggressive stroke risk factor management  Therapy recommendations:  pending  Disposition:   pending  Right femoral pseudoaneurysm with massive thigh hematoma s/p IR due to PFA tear   VVS on board  requiring surgical suturing and clot evacuation  Dress dry and clean  JP drain for one more day  Afib not on [facility]  Followed with cardiology  Not AC candidate due to previous SDH s/p crani and evacuation  Currently not candidate for [facility] due to large size of stroke, large groin hematoma s/p evacuation and anemia.  On metoprolol for RVR   Hyperlipidemia  Home meds: pravastatin 40mg  PO daily  LDL 78, goal < 70  Statin resumed  Continue statin at discharge  Diabetes  HgbA1c 6.8, goal < 7.0  Controlled  SSI  Anemia  Due to acute blood loss  Hb 9.0->8.2->7.8->7.8->8.7  Close monitoring  PRBC transfusion if needed  Other Stroke Risk Factors  Advanced age  Overweight, Body mass index is 29.56 kg/m., recommend weight loss, diet and exercise as appropriate   Family hx stroke (Mother)  CAD  Other Active Problems  Thrombocytopenia - 30 -> 96 -> 127->183  Leukocytosis - 11.1->9.3->7.4->13.1 -> 11.9->13.2  Mild hypokalemia - 3.4 -> 3.3 -> 3.2->3.5  GI bleed history   Hospital day # 6  This  patient is critically ill due to large left MCA infarct s/p IR, right groin hematoma s/p evacuation, afib RVR, anemia, respiratory failure and at significant risk of neurological worsening, death form recurrent stroke, hemorrhagic transformation, shock, heart failure, aspiration pneumonia. This patient's care requires constant monitoring of vital signs, hemodynamics, respiratory and cardiac monitoring, review of multiple databases, neurological assessment, discussion with family, other specialists and medical decision  making of high complexity. I spent 35 minutes of neurocritical care time in the care of this patient. I had long discussion with family members at bedside, updated pt current condition, treatment plan and potential prognosis. They expressed understanding and appreciation.   Rosalin Hawking, MD PhD Stroke Neurology 07/02/2017 1:59 PM   To contact Stroke Continuity provider, please refer to [email or web address] After hours, contact General Neurology

## 2017-07-02 NOTE — Evaluation (Signed)
Physical Therapy Evaluation Patient Details Name: Robert Keith MRN: [id number] DOB: 03/14/1933 Today's Date: 07/02/2017   History of Present Illness  Pt is an 83 y.o. male who presented to the ED with global aphasia, R hemiplegia, and L gaze deviation. Pt with L M1 occlusion s/p tPA and mechanical thrombectomy with revascularization. Pt has a history of prostate cancer, CAD, history of GI bleed, DM2, essential tremor, hypertension, history of atrial fibrillation and subdural hematoma s/p craniectomy and clot evaluation. He was intubated 7/11-7/16. Pt now with significant R thigh hematoma with R femoral pseudoaneurysm following sheath removal.   Clinical Impression  Pt admitted with above diagnosis. Pt currently with functional limitations due to the deficits listed below (see PT Problem List). At the time of PT eval pt was able to perform transfers to/from EOB with +2 max assist. Tolerance for functional activity is low at this time, and pt fatigued at EOB, requiring increased assist to maintain upright sitting posture towards end of session. Feel this patient would benefit from the increased frequency of therapy at the CIR level, to maximize functional independence and decrease burden of care on caregivers. If pt is not able to tolerate more activity in the next few acute PT sessions, then a higher level of care (SNF) may be more appropriate at d/c. Will continue to follow and update PT follow-up recommendations as needed.        Follow Up Recommendations CIR;Supervision/Assistance - 24 hour    Equipment Recommendations  Wheelchair (measurements PT);Wheelchair cushion (measurements PT)    Recommendations for Other Services Rehab consult     Precautions / Restrictions Precautions Precautions: Fall Precaution Comments: JP drain R thigh Restrictions Weight Bearing Restrictions: No      Mobility  Bed Mobility Overal bed mobility: Needs Assistance Bed Mobility: Rolling;Sidelying to Sit;Sit  to Sidelying Rolling: +2 for physical assistance;Max assist Sidelying to sit: Total assist;+2 for physical assistance     Sit to sidelying: Total assist;+2 for physical assistance General bed mobility comments: Total assistance required to manage B LE off of and on to bed as well as at trunk to raise from elevated HOB.   Transfers Overall transfer level: Needs assistance Equipment used: 2 person hand held assist Transfers: Lateral/Scoot Transfers          Lateral/Scoot Transfers: Total assist;Max assist;+2 physical assistance General transfer comment: Max to total assist +2 for scoot toward [facility] to reposition in bed. Unable to attempt standing position.   Ambulation/Gait             General Gait Details: Unable at this time  Stairs            Wheelchair Mobility    Modified Rankin (Stroke Patients Only)       Balance Overall balance assessment: Needs assistance Sitting-balance support: Bilateral upper extremity supported;Feet supported;No upper extremity supported Sitting balance-Leahy Scale: Poor Sitting balance - Comments: Fluctuating from total to mod assist to maintain sitting balance. Pt with significant posterior lean and pushing posteriorly. Postural control: Posterior lean                                   Pertinent Vitals/Pain Pain Assessment: Faces Faces Pain Scale: Hurts a little bit Pain Location: Generalized grimacing with mobility.  Pain Descriptors / Indicators: Grimacing Pain Intervention(s): Limited activity within patient's tolerance;Monitored during session;Repositioned    Home Living Family/patient expects to be discharged to:: Private residence Living  Arrangements: Alone (24/7 caregiver) Available Help at Discharge: Personal care attendant;Available 24 hours/day Type of Home: House Home Access: Level entry     Home Layout: One level Home Equipment: Cane - single point;Walker - 4 wheels Additional Comments: Was  receiving HHPT and HHOT pta.    Prior Function Level of Independence: Needs assistance   Gait / Transfers Assistance Needed: PCA present for assistance when needed. Ambulated with rollator  ADL's / Homemaking Assistance Needed: Independently bathing and dressing (supervision from caregiver).        Hand Dominance        Extremity/Trunk Assessment   Upper Extremity Assessment Upper Extremity Assessment: Defer to OT evaluation RUE Deficits / Details: Limited assessment due to impaired communication. Able to demonstrate 2/5 grasp strength.  LUE Deficits / Details: Generalized weakness 4/5 grossly.     Lower Extremity Assessment Lower Extremity Assessment: Generalized weakness;RLE deficits/detail (Unable to MMT due to cognition) RLE Deficits / Details: Noted minimal active movement throughout session - was attempting to initiate movement to EOB however required max assist to complete. LLE was able to attempt advancing to EOB with mod -max assist to complete.     Cervical / Trunk Assessment Cervical / Trunk Assessment: Kyphotic (significant forward head posture noted in sitting)  Communication      Cognition Arousal/Alertness: Awake/alert Behavior During Therapy: Flat affect Overall Cognitive Status: Difficult to assess Area of Impairment: Following commands                       Following Commands: Follows one step commands inconsistently       General Comments: Difficult to assess due to aphasia. Pt following some commands with verbal and visual cues.       General Comments General comments (skin integrity, edema, etc.): HR up to 142 with 20-30 second runs (x2) of V-tach with RN aware.     Exercises     Assessment/Plan    PT Assessment Patient needs continued PT services  PT Problem List Decreased strength;Decreased range of motion;Decreased activity tolerance;Decreased balance;Decreased mobility;Decreased coordination;Decreased cognition;Decreased  knowledge of use of DME;Decreased safety awareness;Decreased knowledge of precautions;Impaired sensation;Impaired tone       PT Treatment Interventions DME instruction;Gait training;Stair training;Functional mobility training;Therapeutic activities;Therapeutic exercise;Neuromuscular re-education;Patient/family education    PT Goals (Current goals can be found in the Care Plan section)  Acute Rehab PT Goals Patient Stated Goal: unable to state PT Goal Formulation: Patient unable to participate in goal setting Time For Goal Achievement: 07/16/17 Potential to Achieve Goals: Fair    Frequency Min 3X/week   Barriers to discharge        Co-evaluation PT/OT/SLP Co-Evaluation/Treatment: Yes Reason for Co-Treatment: Complexity of the patient's impairments (multi-system involvement);Necessary to address cognition/behavior during functional activity;For patient/therapist safety;To address functional/ADL transfers PT goals addressed during session: Mobility/safety with mobility;Balance OT goals addressed during session: ADL's and self-care       AM-PAC PT "6 Clicks" Daily Activity  Outcome Measure Difficulty turning over in bed (including adjusting bedclothes, sheets and blankets)?: Total Difficulty moving from lying on back to sitting on the side of the bed? : Total Difficulty sitting down on and standing up from a chair with arms (e.g., wheelchair, bedside commode, etc,.)?: Total Help needed moving to and from a bed to chair (including a wheelchair)?: Total Help needed walking in hospital room?: Total Help needed climbing 3-5 steps with a railing? : Total 6 Click Score: 6    End of Session Equipment Utilized  During Treatment: Gait belt;Oxygen Activity Tolerance: Patient limited by fatigue Patient left: in bed;with bed alarm set;with call bell/phone within reach;with SCD's reapplied;with restraints reapplied (1 mit on L hand) Nurse Communication: Mobility status;Need for lift  equipment PT Visit Diagnosis: Muscle weakness (generalized) (M62.81);Hemiplegia and hemiparesis Hemiplegia - Right/Left: Right Hemiplegia - dominant/non-dominant: Dominant Hemiplegia - caused by: Cerebral infarction    Time: 1594-7076 PT Time Calculation (min) (ACUTE ONLY): 44 min   Charges:   PT Evaluation $PT Eval Moderate Complexity: 1 Procedure PT Treatments $Therapeutic Activity: 8-22 mins   PT G Codes:        Rolinda Roan, PT, DPT Acute Rehabilitation Services Pager: [phone]   Thelma Comp 07/02/2017, 12:54 PM

## 2017-07-03 ENCOUNTER — Inpatient Hospital Stay (HOSPITAL_COMMUNITY): Payer: Medicare Other

## 2017-07-03 LAB — BASIC METABOLIC PANEL
Anion gap: 10 (ref 5–15)
BUN: 26 mg/dL — AB (ref 6–20)
CHLORIDE: 110 mmol/L (ref 101–111)
CO2: 26 mmol/L (ref 22–32)
Calcium: 7.9 mg/dL — ABNORMAL LOW (ref 8.9–10.3)
Creatinine, Ser: 0.9 mg/dL (ref 0.61–1.24)
GFR calc Af Amer: >60 mL/min (ref >60)
GFR calc non Af Amer: >60 mL/min (ref >60)
GLUCOSE: 192 mg/dL — AB (ref 65–99)
POTASSIUM: 3.3 mmol/L — AB (ref 3.5–5.1)
Sodium: 146 mmol/L — ABNORMAL HIGH (ref 135–145)

## 2017-07-03 LAB — CBC
HEMATOCRIT: 26.9 % — AB (ref 39.0–52.0)
Hemoglobin: 8.7 g/dL — ABNORMAL LOW (ref 13.0–17.0)
MCH: 29.2 pg (ref 26.0–34.0)
MCHC: 32.3 g/dL (ref 30.0–36.0)
MCV: 90.3 fL (ref 78.0–100.0)
PLATELETS: 255 10*3/uL (ref 150–400)
RBC: 2.98 MIL/uL — ABNORMAL LOW (ref 4.22–5.81)
RDW: 16.8 % — ABNORMAL HIGH (ref 11.5–15.5)
WBC: 9.9 10*3/uL (ref 4.0–10.5)

## 2017-07-03 LAB — GLUCOSE, CAPILLARY
GLUCOSE-CAPILLARY: 246 mg/dL — AB (ref 65–99)
GLUCOSE-CAPILLARY: 191 mg/dL — AB (ref 65–99)
GLUCOSE-CAPILLARY: 190 mg/dL — AB (ref 65–99)
Glucose-Capillary: 224 mg/dL — ABNORMAL HIGH (ref 65–99)
Glucose-Capillary: 223 mg/dL — ABNORMAL HIGH (ref 65–99)

## 2017-07-03 MED ORDER — PROPRANOLOL HCL 40 MG PO TABS
40.0000 mg | ORAL_TABLET | Freq: Every day | ORAL | Status: DC
  Administered 2017-07-03: 40 mg via ORAL
  Filled 2017-07-03 (×2): qty 1

## 2017-07-03 MED ORDER — ACETYLCYSTEINE 10 % IN SOLN
2.0000 mL | Freq: Two times a day (BID) | RESPIRATORY_TRACT | Status: DC
  Administered 2017-07-03: 2 mL via RESPIRATORY_TRACT
  Filled 2017-07-03 (×2): qty 2

## 2017-07-03 MED ORDER — ACETYLCYSTEINE 20 % IN SOLN
3.0000 mL | Freq: Two times a day (BID) | RESPIRATORY_TRACT | Status: DC
  Administered 2017-07-03: 3 mL via RESPIRATORY_TRACT
  Filled 2017-07-03: qty 4

## 2017-07-03 MED ORDER — ACETYLCYSTEINE 20 % IN SOLN
3.0000 mL | Freq: Two times a day (BID) | RESPIRATORY_TRACT | Status: DC
  Administered 2017-07-04: 3 mL via RESPIRATORY_TRACT
  Filled 2017-07-03: qty 4

## 2017-07-03 MED ORDER — PROPRANOLOL HCL 40 MG PO TABS: 40.0000 mg | ORAL_TABLET | Freq: Two times a day (BID) | ORAL | Status: DC

## 2017-07-03 MED ORDER — FREE WATER
200.0000 mL | Freq: Four times a day (QID) | Status: DC
  Administered 2017-07-03 – 2017-07-04 (×4): 200 mL

## 2017-07-03 MED ORDER — PIOGLITAZONE HCL 15 MG PO TABS
15.0000 mg | ORAL_TABLET | Freq: Every day | ORAL | Status: DC
  Administered 2017-07-03 – 2017-07-04 (×2): 15 mg via ORAL
  Filled 2017-07-03 (×3): qty 1

## 2017-07-03 MED ORDER — ALBUTEROL SULFATE (2.5 MG/3ML) 0.083% IN NEBU
2.5000 mg | INHALATION_SOLUTION | Freq: Two times a day (BID) | RESPIRATORY_TRACT | Status: DC
  Administered 2017-07-03 – 2017-07-09 (×13): 2.5 mg via RESPIRATORY_TRACT
  Filled 2017-07-03 (×13): qty 3

## 2017-07-03 MED ORDER — GLIPIZIDE 10 MG PO TABS
10.0000 mg | ORAL_TABLET | Freq: Two times a day (BID) | ORAL | Status: DC
  Administered 2017-07-03 – 2017-07-04 (×4): 10 mg via ORAL
  Filled 2017-07-03 (×6): qty 1

## 2017-07-03 MED ORDER — ALBUTEROL SULFATE (2.5 MG/3ML) 0.083% IN NEBU
INHALATION_SOLUTION | RESPIRATORY_TRACT | Status: AC
  Filled 2017-07-03: qty 3

## 2017-07-03 MED ORDER — FUROSEMIDE 40 MG PO TABS
40.0000 mg | ORAL_TABLET | Freq: Every day | ORAL | Status: DC
  Administered 2017-07-03: 40 mg via ORAL
  Filled 2017-07-03: qty 1

## 2017-07-03 MED ORDER — POTASSIUM CHLORIDE 20 MEQ/15ML (10%) PO SOLN
40.0000 meq | ORAL | Status: AC
  Administered 2017-07-03 (×2): 40 meq via ORAL
  Filled 2017-07-03 (×2): qty 30

## 2017-07-03 NOTE — Procedures (Deleted)
Extubation Procedure Note  Patient Details:   Name: Robert Keith DOB: 1933-06-11 MRN: [id number]   Airway Documentation:     Evaluation  O2 sats: stable throughout Complications: No apparent complications Patient did tolerate procedure well. Bilateral Breath Sounds: Diminished, Rhonchi   Yes  Placed on 4l/min [place] Incentive spirometer instructed  Revonda Standard 07/03/2017, 8:48 AM

## 2017-07-03 NOTE — Progress Notes (Signed)
Physical Therapy Treatment Patient Details Name: Robert Keith MRN: [id number] DOB: 1933/02/02 Today's Date: 07/03/2017    History of Present Illness Pt is an 83 y.o. male who presented to the ED with global aphasia, R hemiplegia, and L gaze deviation. Pt with L M1 occlusion s/p tPA and mechanical thrombectomy with revascularization. Pt has a history of prostate cancer, CAD, history of GI bleed, DM2, essential tremor, hypertension, history of atrial fibrillation and subdural hematoma s/p craniectomy and clot evaluation. He was intubated 7/11-7/16. Pt now with significant R thigh hematoma with R femoral pseudoaneurysm following sheath removal.     PT Comments    Pt progressing towards physical therapy goals. Pt demonstrated improved sitting posture at EOB with facilitation of trunk extension and R lateral lean activity to eventually achieve midline. Was able to tolerate squat pivot transfer to recliner chair with +2 assist and bed pad for support under hips. Will continue to follow and progress as able per POC.   Follow Up Recommendations  CIR;Supervision/Assistance - 24 hour     Equipment Recommendations  Wheelchair (measurements PT);Wheelchair cushion (measurements PT)    Recommendations for Other Services Rehab consult     Precautions / Restrictions Precautions Precautions: Fall Precaution Comments: JP drain R thigh Restrictions Weight Bearing Restrictions: No    Mobility  Bed Mobility Overal bed mobility: Needs Assistance Bed Mobility: Rolling;Sidelying to Sit Rolling: +2 for physical assistance;Max assist Sidelying to sit: Max assist;+2 for physical assistance       General bed mobility comments: Total assistance required to manage B LE off of and on to bed as well as at trunk to raise from elevated HOB.   Transfers Overall transfer level: Needs assistance Equipment used: 2 person hand held assist Transfers: Squat Pivot Transfers     Squat pivot transfers: Max  assist;+2 physical assistance     General transfer comment: +2 max assist for balance support and safety. Pt was able to hold to therapist's arm on L and bear some weight through LE's to squat pivot to recliner chair. R knee blocked and RUE supported for transition. Bed pad used to assist in supporting hips while hips were raised.   Ambulation/Gait             General Gait Details: Unable at this time   Stairs            Wheelchair Mobility    Modified Rankin (Stroke Patients Only)       Balance Overall balance assessment: Needs assistance Sitting-balance support: Bilateral upper extremity supported;Feet supported;No upper extremity supported Sitting balance-Leahy Scale: Poor Sitting balance - Comments: Fluctuating from total to mod assist to maintain sitting balance. Pt with significant posterior lean and pushing posteriorly. Postural control: Posterior lean                                  Cognition Arousal/Alertness: Awake/alert Behavior During Therapy: Flat affect Overall Cognitive Status: Difficult to assess Area of Impairment: Following commands                       Following Commands: Follows one step commands inconsistently       General Comments: Difficult to assess due to aphasia. Pt following some commands with verbal and visual cues.       Exercises Other Exercises Other Exercises: Pt tolerated ~8 minutes of EOB activity in which therapist facilitated sitting balance, R lateral  lean onto elbow and improved midline posture. Assist provided for cervical extension to neutral, and pt appeared to recognize and smile at daughters who were present in the room. Therapist performed 2x30" bilateral pectoral stretch in sitting.     General Comments        Pertinent Vitals/Pain Pain Assessment: Faces Faces Pain Scale: No hurt    Home Living                      Prior Function            PT Goals (current goals can  now be found in the care plan section) Acute Rehab PT Goals Patient Stated Goal: unable to state PT Goal Formulation: Patient unable to participate in goal setting Time For Goal Achievement: 07/16/17 Potential to Achieve Goals: Fair    Frequency    Min 3X/week      PT Plan      Co-evaluation PT/OT/SLP Co-Evaluation/Treatment: Yes Reason for Co-Treatment: Complexity of the patient's impairments (multi-system involvement);Necessary to address cognition/behavior during functional activity;For patient/therapist safety;To address functional/ADL transfers PT goals addressed during session: Mobility/safety with mobility;Balance        AM-PAC PT "6 Clicks" Daily Activity  Outcome Measure  Difficulty turning over in bed (including adjusting bedclothes, sheets and blankets)?: Total Difficulty moving from lying on back to sitting on the side of the bed? : Total Difficulty sitting down on and standing up from a chair with arms (e.g., wheelchair, bedside commode, etc,.)?: Total Help needed moving to and from a bed to chair (including a wheelchair)?: Total Help needed walking in hospital room?: Total Help needed climbing 3-5 steps with a railing? : Total 6 Click Score: 6    End of Session Equipment Utilized During Treatment: Gait belt;Oxygen Activity Tolerance: Patient limited by fatigue Patient left: in bed;with bed alarm set;with call bell/phone within reach;with SCD's reapplied;with restraints reapplied (1 mit on L hand) Nurse Communication: Mobility status;Need for lift equipment PT Visit Diagnosis: Muscle weakness (generalized) (M62.81);Hemiplegia and hemiparesis Hemiplegia - Right/Left: Right Hemiplegia - dominant/non-dominant: Dominant Hemiplegia - caused by: Cerebral infarction     Time: 0156-1537 PT Time Calculation (min) (ACUTE ONLY): 37 min  Charges:  $Therapeutic Activity: 8-22 mins                    G Codes:       Rolinda Roan, PT, DPT Acute Rehabilitation  Services Pager: [phone]    Thelma Comp 07/03/2017, 2:05 PM

## 2017-07-03 NOTE — Progress Notes (Signed)
STROKE TEAM PROGRESS NOTE   SUBJECTIVE (INTERVAL HISTORY) His daughters are at the bedside. Pt Tolerating extubation well but still has secretions and throat and A. fib RVR. Right groin drainage decreased, however not able to pull off.   OBJECTIVE Temp:  [97.8 F (36.6 C)-99.7 F (37.6 C)] 98.6 F (37 C) (07/18 1703) Pulse Rate:  [60-127] 96 (07/18 1703) Cardiac Rhythm: Atrial fibrillation (07/18 0800) Resp:  [17-30] 30 (07/18 1703) BP: (106-151)/(62-107) 129/83 (07/18 1703) SpO2:  [95 %-100 %] 96 % (07/18 1703) Weight:  [196 lb 6.9 oz (89.1 kg)] 196 lb 6.9 oz (89.1 kg) (07/18 0500)  CBC:  Recent Labs Lab 06/27/17 0314  07/02/17 0406 07/03/17 0205  WBC 11.1*  < > 13.2* 9.9  NEUTROABS 9.4*  --   --   --   HGB 8.8*  < > 8.7* 8.7*  HCT 26.9*  < > 27.3* 26.9*  MCV 92.1  < > 91.0 90.3  PLT 153  < > 183 255  < > = values in this interval not displayed.  Basic Metabolic Panel:  Recent Labs Lab 06/27/17 0314  06/29/17 0400  07/02/17 0406 07/03/17 0205  NA 139  < > 141  < > 145 146*  K 3.9  < > 3.4*  < > 3.5 3.3*  CL 114*  < > 111  < > 112* 110  CO2 20*  < > 23  < > 24 26  GLUCOSE 165*  < > 184*  < > 166* 192*  BUN 16  < > 16  < > 22* 26*  CREATININE 0.89  < > 0.97  < > 0.81 0.90  CALCIUM 7.4*  < > 7.4*  < > 8.0* 7.9*  MG 1.7  --  1.7  --  2.2  --   PHOS 3.2  --  2.1*  --   --   --   < > = values in this interval not displayed.  Lipid Panel:     Component Value Date/Time   CHOL 115 06/27/2017 0500   TRIG 86 06/27/2017 0500   HDL 20 (L) 06/27/2017 0500   CHOLHDL 5.8 06/27/2017 0500   VLDL 17 06/27/2017 0500   LDLCALC 78 06/27/2017 0500   HgbA1c:  Lab Results  Component Value Date   HGBA1C 6.8 (H) 06/27/2017   Urine Drug Screen: No results found for: LABOPIA, COCAINSCRNUR, LABBENZ, AMPHETMU, THCU, LABBARB  Alcohol Level No results found for: [place] I have personally reviewed the radiological images below and agree with the radiology  interpretations.  Ct Abdomen Pelvis Wo Contrast 06/27/2017 1. Hematoma within the proximal RIGHT anterior thigh is incompletely imaged. Hematoma likely spontaneous related to anticoagulation. Recommend clinical correlation with extent of hematoma in the RIGHT thigh.  2. No intraperitoneal or retroperitoneal hemorrhage.  3. Extensive diverticulosis without diverticulitis.  4.  Aortic Atherosclerosis (ICD10-I70.0).  5. Small bilateral pleural effusions.    Ct Angio Head and neck W and Wo Contrast 06/26/2017 1. Emergent large vessel occlusion of the left M1 segment.  2. There is some reconstitution of anterior left MCA branch vessels from pial collaterals.  3. Dense calcifications at the carotid bifurcations bilaterally without significant stenoses relative to the more distal vessels.  4. Moderate narrowing of the proximal posterior cerebral arteries bilaterally, right greater than left.  5. Calcifications at great vessel origins along the arch without significant stenosis. 6. Hypoplastic left vertebral artery.  7. Multilevel cervical spinal spondylosis.  8. Emphysema.  9. Prior granulomatous  disease in the right upper lobe.  Cerebral aneurysm and thrombectomy - Dr. Estanislado Pandy 06/26/2017 Status post endovascular complete revascularization of occluded left middle cerebral artery with 1 pass with Solitaire 4 mm x 40 mm FR retrieval device and a 6 [place] intermediary guide catheter achieving a TICI 3 reperfusion.  Mr Brain Wo Contrast 06/27/2017 Completed acute infarction affecting the majority of the inferior division left MCA distribution. This affects the left temporal lobe and posterior frontal lobe with patchy involvement in the parietal region.  Infarction of the caudate and putamen as well.  Petechial blood product deposition without focal hematoma.  Swelling but no shift at this time.  Ct Cerebral Perfusion W Contrast 06/26/2017 Left M1 emergent large vessel occlusion with associated  acute left MCA territory infarct.  Core infarct is estimated at 49 mL.  The tissue at risk is estimated at 182 mL.   Ct Head Code Stroke W/o Cm 06/26/2017 1. Focal hyperdensity in left distal M1 may represent thrombus.  2. No vascular territory infarct identified at this time.  3. Small left frontal subdural hematoma is stable in size and attenuation from 2014.  4. ASPECTS is 10   06/29/2017 1. Evolving large LEFT MCA territory infarct (LEFT basal ganglia, LEFT temporoparietal lobes) with petechial hemorrhage, no hemorrhagic conversion.  2. Acute small volume LEFT cerebrum extra-axial blood products.  3. Chronic changes include old small LEFT frontal subdural hematoma and small LEFT frontal encephalomalacia.    Transthoracic echocardiogram 06/28/2017 Study Conclusions - Left ventricle: The cavity size was normal. There was mild   concentric hypertrophy. Systolic function was normal. The    estimated ejection fraction was in the range of 50% to 55%.   Hypokinesis of the apical inferior and akinesis of the basal to   mid inferior myocardium. Doppler parameters are consistent with   indeterminate ventricular filling pressure. - Aortic valve: Valve mobility was restricted. There was mild   stenosis. There was mild regurgitation. Peak velocity (S): 300   cm/s. Mean gradient (S): 19 mm Hg. Valve area (VTI): 1.64 cm^2.   Valve area (Vmax): 1.49 cm^2. Valve area (Vmean): 1.39 cm^2.   Regurgitation pressure half-time: 723 ms. - Mitral valve: Transvalvular velocity was within the normal range.   There was no evidence for stenosis. There was no regurgitation. - Left atrium: The atrium was severely dilated. - Right ventricle: The cavity size was normal. Wall thickness was   normal. Systolic function was normal. - Tricuspid valve: There was mild regurgitation. - Pulmonary arteries: Systolic pressure was within the normal   range. PA peak pressure: 27 mm Hg (S)   PHYSICAL EXAM  Temp:   [97.8 F (36.6 C)-99.7 F (37.6 C)] 98.6 F (37 C) (07/18 1703) Pulse Rate:  [60-127] 96 (07/18 1703) Resp:  [17-30] 30 (07/18 1703) BP: (106-151)/(62-107) 129/83 (07/18 1703) SpO2:  [95 %-100 %] 96 % (07/18 1703) Weight:  [196 lb 6.9 oz (89.1 kg)] 196 lb 6.9 oz (89.1 kg) (07/18 0500)  General - Well nourished, well developed, more awake alert.  Ophthalmologic - Fundi not visualized due to noncooperation.  Cardiovascular - irregularly irregular heart rate and rhythm with RVR.  Extremities - right groin JP drain, would dress dry and clean.   Neuro - lethargic, but more awake alert, smiled to provider this morning, eyes open, not following commands except "close eyes", eye left gaze preference, able to cross midline, blinking to visual threat on the left but not on the right. PERRL. Right facial droop, tongue  midline in mouth. Spontaneous movement of LUE and LLE, at least 3/5 LUE and 2+/5 LLE. RUE 3-/5 and RLE mild withdraw on pain stimulation. DTR 1+ and right positive babinski. Sensation, coordination and gait not tested.    ASSESSMENT/PLAN [name] is a 83 y.o. male with history of prostate cancer, CAD, history of GI bleed, DM2, essential tremor, hypertension, history of atrial fibrillation and subdural hematoma, s/p craniectomy and clot evacuation who presented with right facial droop, dense right hemiplegia global aphasia, and left gaze deviation. He recevied tPA at 1507 on 06/26/2017.  CTA Occluded left M1 -> IR thrombectomy TICI 3 reperfusion with Dr. Estanislado Pandy.  Stroke: left MCA infarct due to left M1 occlusion s/p thrombectomy due to afib not on AC  Resultant  aphasia and right hemiplegia  CT head:  Focal hyperdensity in left distal M1 may represent thrombus. Small left frontal subdural hematoma is stable in size and attenuation from 2014.   MRI head: left MCA infarct. Petechial blood product deposition without focal hematoma.  CT Angio Head: Emergent large vessel  occlusion of the left M1 segment. Dense calcifications at the carotid bifurcations bilaterally without significant stenoses  2D Echo - EF 50-55%. Left atrium severely dilated. No cardiac source of emboli identified.  LDL 78  HgbA1c 6.8  SCDs for VTE prophylaxis Diet NPO time specified  No antithrombotic prior to admission, now on No antithrombotic. Pt not on AC at baseline due to previous SDH s/p crani and evacuation. Currently not candidate for [facility] due to large size of stroke, large groin hematoma s/p evacuation and dropping H&H.  Patient counseled to be compliant with his antithrombotic medications  Ongoing aggressive stroke risk factor management  Therapy recommendations:  pending  Disposition:   pending  Right femoral pseudoaneurysm with massive thigh hematoma s/p IR due to PFA tear   VVS on board  requiring surgical suturing and clot evacuation  Dress dry and clean  JP drain for one more day  Afib not on [facility]  Followed with cardiology  Not on AC at baseline due to previous SDH s/p crani and evacuation  Currently not candidate for [facility] due to large size of stroke, large groin hematoma s/p evacuation and anemia.  On metoprolol for RVR  Add propranolol as per patient home medication  May consider Cardizem if RVR not in control   SOB  Increased secretion  Add Mucomyst nebulization  NT suctioning when necessary  Add free water and Lasix  Hyperlipidemia  Home meds: pravastatin 40mg  PO daily  LDL 78, goal < 70  Statin resumed  Continue statin at discharge  Diabetes  HgbA1c 6.8, goal < 7.0  Controlled  Hyperglycemia  Resume home medications  SSI  Anemia  Due to acute blood loss  Hb 9.0->8.2->7.8->7.8->8.7->8.7  Close monitoring  PRBC transfusion if needed  Other Stroke Risk Factors  Advanced age  Overweight, Body mass index is 29.01 kg/m., recommend weight loss, diet and exercise as appropriate   Family hx stroke  (Mother)  CAD  Other Active Problems  Thrombocytopenia - 26 -> 96 -> 127->183->255  Leukocytosis - 11.1->9.3->7.4->13.1 -> 11.9->13.2->9.9  Mild hypokalemia - 3.4 -> 3.3 -> 3.2->3.5->3.3  Hyponatremia 146  GI bleed history   Hospital day # 7  This patient is critically ill due to large left MCA infarct s/p IR, right groin hematoma s/p evacuation, afib RVR, anemia, respiratory failure and at significant risk of neurological worsening, death form recurrent stroke, hemorrhagic transformation, shock, heart failure, aspiration pneumonia.  This patient's care requires constant monitoring of vital signs, hemodynamics, respiratory and cardiac monitoring, review of multiple databases, neurological assessment, discussion with family, other specialists and medical decision making of high complexity. I spent 40 minutes of neurocritical care time in the care of this patient. I had long discussion with family members at bedside, updated pt current condition, treatment plan and potential prognosis. They expressed understanding and appreciation.   Rosalin Hawking, MD PhD Stroke Neurology 07/03/2017 6:03 PM   To contact Stroke Continuity provider, please refer to [email or web address] After hours, contact General Neurology

## 2017-07-03 NOTE — Progress Notes (Signed)
Received Patient from 4N . Transferred to low bed. Patient is asleep, alert to painful stimuli. Here d/t L MCA which has left him aphasic and with R sided hemiparesis.  Currently he AMT Bridle Pro Nasal Tube Retaining System with osmolite 1.2 going at 60 cc/hr. Condom catheter came off during transfer. Will place a new one.  IV isi in his RAC at [facility]. Telemetry box 12 on and verified, as is armband.  Spoke with family, who is at bedside. He has a code word "Yvone Neu". Patient resting comfortably. Will continue to monitor and follow MD Orders.

## 2017-07-03 NOTE — Progress Notes (Addendum)
Occupational Therapy Treatment Patient Details Name: Robert Keith MRN: [id number] DOB: 1933/03/14 Today's Date: 07/03/2017    History of present illness Pt is an 83 y.o. male who presented to the ED with global aphasia, R hemiplegia, and L gaze deviation. Pt with L M1 occlusion s/p tPA and mechanical thrombectomy with revascularization. Pt has a history of prostate cancer, CAD, history of GI bleed, DM2, essential tremor, hypertension, history of atrial fibrillation and subdural hematoma s/p craniectomy and clot evaluation. He was intubated 7/11-7/16. Pt now with significant R thigh hematoma with R femoral pseudoaneurysm following sheath removal.    OT comments  Pt progressing toward OT goals. He did demonstrate improved activity tolerance for ADL participation this session to sit at EOB with fluctuating assistance for approximately 10 minutes for ADL participation. With facilitation, pt was able to maintain midline positioning and sustain midline gaze. He was able to complete squat-pivot simulated toilet transfer with max assist +2 this session. Vital signs stable throughout session with HR sustained in low 100's. Continue to recommend CIR level therapies post-acute D/C in order to maximize functional independence.    Follow Up Recommendations  CIR;Supervision/Assistance - 24 hour    Equipment Recommendations  Other (comment) (TBD)    Recommendations for Other Services      Precautions / Restrictions Precautions Precautions: Fall Precaution Comments: JP drain R thigh Restrictions Weight Bearing Restrictions: No       Mobility Bed Mobility Overal bed mobility: Needs Assistance Bed Mobility: Rolling;Sidelying to Sit Rolling: +2 for physical assistance;Max assist Sidelying to sit: Max assist;+2 for physical assistance     Sit to sidelying: Total assist;+2 for physical assistance General bed mobility comments: Total assistance required to manage B LE off of and on to bed as well as at  trunk to raise from elevated HOB.   Transfers Overall transfer level: Needs assistance Equipment used: 2 person hand held assist Transfers: Squat Pivot Transfers     Squat pivot transfers: Max assist;+2 physical assistance     General transfer comment: +2 max assist for balance support and safety. Pt was able to hold to therapist's arm on L and bear some weight through LE's to squat pivot to recliner chair. R knee blocked and RUE supported for transition. Bed pad used to assist in supporting hips while hips were raised.     Balance Overall balance assessment: Needs assistance Sitting-balance support: Bilateral upper extremity supported;Feet supported;No upper extremity supported Sitting balance-Leahy Scale: Poor Sitting balance - Comments: Fluctuating from total to mod assist to maintain sitting balance. Pt with significant posterior lean and pushing posteriorly. Postural control: Posterior lean   Standing balance-Leahy Scale: Zero                             ADL either performed or assessed with clinical judgement   ADL Overall ADL's : Needs assistance/impaired                         Toilet Transfer: Maximal assistance;+2 for physical assistance;BSC;Squat-pivot Toilet Transfer Details (indicate cue type and reason): Simulated from bed to chair.            General ADL Comments: Pt with increased activity tolerance for ADL tasks this session and was able to sit at EOB to participate in ADL tasks for ~10 minutes to participate in ADL. Pt farigueing toward end of session but was able to complete squat-pivot transfer to  chair with max assist +2.      Vision   Additional Comments: Improved ability to cross midline gaze this session.    Perception     Praxis      Cognition Arousal/Alertness: Awake/alert Behavior During Therapy: Flat affect Overall Cognitive Status: Difficult to assess Area of Impairment: Following commands                        Following Commands: Follows one step commands inconsistently       General Comments: Difficult to assess due to aphasia. Pt following some commands with verbal and visual cues.         Exercises Exercises: Other exercises Other Exercises Other Exercises: Pt tolerated ~8 minutes of EOB activity in which therapist facilitated sitting balance, R lateral lean onto elbow and improved midline posture. Assist provided for cervical extension to neutral, and pt appeared to recognize and smile at daughters who were present in the room. Therapist performed 2x30" bilateral pectoral stretch in sitting.    Shoulder Instructions       General Comments      Pertinent Vitals/ Pain       Pain Assessment: Faces Faces Pain Scale: No hurt  Home Living                                          Prior Functioning/Environment              Frequency  Min 3X/week        Progress Toward Goals  OT Goals(current goals can now be found in the care plan section)  Progress towards OT goals: Progressing toward goals  Acute Rehab OT Goals Patient Stated Goal: unable to state OT Goal Formulation: Patient unable to participate in goal setting Time For Goal Achievement: 07/16/17 Potential to Achieve Goals: Good  Plan Discharge plan remains appropriate    Co-evaluation    PT/OT/SLP Co-Evaluation/Treatment: Yes Reason for Co-Treatment: Complexity of the patient's impairments (multi-system involvement);Necessary to address cognition/behavior during functional activity;For patient/therapist safety;To address functional/ADL transfers PT goals addressed during session: Mobility/safety with mobility;Balance OT goals addressed during session: ADL's and self-care      AM-PAC PT "6 Clicks" Daily Activity     Outcome Measure   Help from another person eating meals?: Total Help from another person taking care of personal grooming?: Total Help from another person toileting,  which includes using toliet, bedpan, or urinal?: Total Help from another person bathing (including washing, rinsing, drying)?: Total Help from another person to put on and taking off regular upper body clothing?: Total Help from another person to put on and taking off regular lower body clothing?: Total 6 Click Score: 6    End of Session Equipment Utilized During Treatment: Gait belt  OT Visit Diagnosis: Other abnormalities of gait and mobility (R26.89);Hemiplegia and hemiparesis;Cognitive communication deficit (R41.841) Symptoms and signs involving cognitive functions: Cerebral infarction Hemiplegia - Right/Left: Right Hemiplegia - caused by: Cerebral infarction   Activity Tolerance Patient tolerated treatment well   Patient Left in chair;with call bell/phone within reach;with chair alarm set;with family/visitor present   Nurse Communication Mobility status        Time: 6283-6629 OT Time Calculation (min): 36 min  Charges: OT General Charges $OT Visit: 1 Procedure OT Treatments $Self Care/Home Management : 8-22 mins  Norman Herrlich, MS OTR/L  Pager: [phone]  Aroldo Galli A Navaeh Kehres 07/03/2017, 4:57 PM

## 2017-07-03 NOTE — Progress Notes (Addendum)
[facility]  Subjective  - Alert.   Objective (!) 151/69 (!) 106 97.8 F (36.6 C) (Axillary) (!) 26 100%  Intake/Output Summary (Last 24 hours) at 07/03/17 0852 Last data filed at 07/03/17 0813  Gross per 24 hour  Intake             1126 ml  Output             1490 ml  Net             -364 ml    Palpable DP 2+ B Right groin soft and healing well Drains 135 cc total last 24 hours   Assessment/Planning: right groin exploration and primary arterial repair with drain placement 6 Days Post-Op  Drain 135 cc total last 24 hours will discuss D/C with DR. Jeani Hawking will be taken out 10-14 days from surgery. Xeroform to skin tears. HGB stable last 48 hours   COLLINS, EMMA [facility] 07/03/2017 8:52 AM   Addendum  I have independently interviewed and examined the patient, and I agree with the physician assistant's findings.  Keep drains another day.  Probably out tomorrow.  Adele Barthel, MD, FACS [facility] [place] Office: [phone] Pager: [phone]  07/03/2017, 9:56 AM   --  Laboratory Lab Results:  Recent Labs  07/02/17 0406 07/03/17 0205  WBC 13.2* 9.9  HGB 8.7* 8.7*  HCT 27.3* 26.9*  PLT 183 255   BMET  Recent Labs  07/02/17 0406 07/03/17 0205  NA 145 146*  K 3.5 3.3*  CL 112* 110  CO2 24 26  GLUCOSE 166* 192*  BUN 22* 26*  CREATININE 0.81 0.90  CALCIUM 8.0* 7.9*    COAG Lab Results  Component Value Date   INR 1.59 06/27/2017   INR 1.67 06/27/2017   INR 1.13 06/26/2017   No results found for: PTT

## 2017-07-03 NOTE — Progress Notes (Signed)
  Speech Language Pathology Treatment: Dysphagia;Cognitive-Linquistic  Patient Details Name: Robert Keith MRN: [id number] DOB: 12/23/1932 Today's Date: 07/03/2017 Time: 5053-9767 SLP Time Calculation (min) (ACUTE ONLY): 10 min  Assessment / Plan / Recommendation Clinical Impression  Pt continues to have coughing with ice chips and even small amounts of water, suggestive of reduced airway protection. RN shares that she is needing to perform NT suctioning in order to facilitate clearance of thick secretions. Dried secretions were noted on pt's velum although could not be completely cleared as pt did not allow SLP to clean the back of his mouth for long.   Pt's voice is a little stronger today, which allows for improved intelligibility of speech. He responded with a social greeting x1, otherwise with only small amounts of jargon mixed with some echolalia observed. SLP provided Max cues to attempt automatic speech tasks, with pt seemingly repeating "three" and "four" during counting although these were also marked by phonemic paraphasias. SLP provided Mod cues throughout session for command following. He continues to present with a mixed receptive/expressive aphasia.   HPI HPI: Pt is an 83 y.o.malewith history of prostate cancer, CAD, history of GI bleed, DM2, essential tremor, hypertension, history of atrial fibrillation and subdural hematoma s/p craniectomy and clot evacuation,who presented with right facial droop, dense right hemiplegia global aphasia, and left gaze deviation. He recevied tPA and went to IR for thrombectomy. He was intubated 7/11-7/16. Pt did have a prior MBS in September 2016 with swallowing WFL.       SLP Plan  Continue with current plan of care       Recommendations  Diet recommendations: NPO Medication Administration: Via alternative means                Oral Care Recommendations: Oral care QID Follow up Recommendations: Inpatient Rehab SLP Visit Diagnosis:  Dysphagia, unspecified (R13.10);Aphasia (R47.01) Plan: Continue with current plan of care       GO                Germain Osgood 07/03/2017, 11:06 AM  Germain Osgood, M.A. CCC-SLP [phone]

## 2017-07-04 ENCOUNTER — Inpatient Hospital Stay (HOSPITAL_COMMUNITY): Payer: Medicare Other

## 2017-07-04 ENCOUNTER — Encounter (HOSPITAL_COMMUNITY): Payer: Self-pay | Admitting: Physician Assistant

## 2017-07-04 ENCOUNTER — Other Ambulatory Visit: Payer: Self-pay

## 2017-07-04 DIAGNOSIS — D72829 Elevated white blood cell count, unspecified: Secondary | ICD-10-CM

## 2017-07-04 DIAGNOSIS — S7011XA Contusion of right thigh, initial encounter: Secondary | ICD-10-CM

## 2017-07-04 DIAGNOSIS — R748 Abnormal levels of other serum enzymes: Secondary | ICD-10-CM

## 2017-07-04 DIAGNOSIS — R4701 Aphasia: Secondary | ICD-10-CM

## 2017-07-04 DIAGNOSIS — G936 Cerebral edema: Secondary | ICD-10-CM

## 2017-07-04 LAB — BASIC METABOLIC PANEL
Anion gap: 10 (ref 5–15)
BUN: 54 mg/dL — ABNORMAL HIGH (ref 6–20)
CHLORIDE: 116 mmol/L — AB (ref 101–111)
CO2: 24 mmol/L (ref 22–32)
Calcium: 8 mg/dL — ABNORMAL LOW (ref 8.9–10.3)
Creatinine, Ser: 1.99 mg/dL — ABNORMAL HIGH (ref 0.61–1.24)
GFR calc non Af Amer: 30 mL/min — ABNORMAL LOW (ref >60)
GFR, EST AFRICAN AMERICAN: 34 mL/min — AB (ref >60)
Glucose, Bld: 193 mg/dL — ABNORMAL HIGH (ref 65–99)
POTASSIUM: 4.3 mmol/L (ref 3.5–5.1)
SODIUM: 150 mmol/L — AB (ref 135–145)

## 2017-07-04 LAB — GLUCOSE, CAPILLARY
GLUCOSE-CAPILLARY: 174 mg/dL — AB (ref 65–99)
GLUCOSE-CAPILLARY: 178 mg/dL — AB (ref 65–99)
Glucose-Capillary: 183 mg/dL — ABNORMAL HIGH (ref 65–99)
Glucose-Capillary: 163 mg/dL — ABNORMAL HIGH (ref 65–99)
Glucose-Capillary: 141 mg/dL — ABNORMAL HIGH (ref 65–99)
Glucose-Capillary: 175 mg/dL — ABNORMAL HIGH (ref 65–99)

## 2017-07-04 LAB — BLOOD GAS, ARTERIAL
ACID-BASE EXCESS: 1.7 mmol/L (ref 0.0–2.0)
Bicarbonate: 24.9 mmol/L (ref 20.0–28.0)
Drawn by: 10006
O2 CONTENT: 1.5 L/min
O2 SAT: 94.2 %
PCO2 ART: 33.2 mmHg (ref 32.0–48.0)
PO2 ART: 66 mmHg — AB (ref 83.0–108.0)
Patient temperature: 98.6
pH, Arterial: 7.487 — ABNORMAL HIGH (ref 7.350–7.450)

## 2017-07-04 LAB — CBC
HEMATOCRIT: 29.8 % — AB (ref 39.0–52.0)
Hemoglobin: 9.3 g/dL — ABNORMAL LOW (ref 13.0–17.0)
MCH: 29.1 pg (ref 26.0–34.0)
MCHC: 31.2 g/dL (ref 30.0–36.0)
MCV: 93.1 fL (ref 78.0–100.0)
Platelets: 266 10*3/uL (ref 150–400)
RBC: 3.2 MIL/uL — AB (ref 4.22–5.81)
RDW: 17 % — ABNORMAL HIGH (ref 11.5–15.5)
WBC: 14.9 10*3/uL — AB (ref 4.0–10.5)

## 2017-07-04 LAB — D-DIMER, QUANTITATIVE (NOT AT ARMC): D DIMER QUANT: 9.68 ug{FEU}/mL — AB (ref 0.00–0.50)

## 2017-07-04 LAB — TROPONIN I: Troponin I: 0.11 ng/mL (ref <0.03)

## 2017-07-04 MED ORDER — ASPIRIN EC 81 MG PO TBEC
81.0000 mg | DELAYED_RELEASE_TABLET | Freq: Every day | ORAL | Status: DC
  Administered 2017-07-04: 81 mg via ORAL
  Filled 2017-07-04: qty 1

## 2017-07-04 MED ORDER — AMLODIPINE BESYLATE 2.5 MG PO TABS
2.5000 mg | ORAL_TABLET | Freq: Two times a day (BID) | ORAL | Status: DC
  Filled 2017-07-04 (×2): qty 1

## 2017-07-04 MED ORDER — AMLODIPINE BESYLATE 5 MG PO TABS
5.0000 mg | ORAL_TABLET | Freq: Every day | ORAL | Status: DC
  Administered 2017-07-04: 5 mg via ORAL
  Filled 2017-07-04 (×2): qty 1

## 2017-07-04 MED ORDER — OSMOLITE 1.2 CAL PO LIQD
1000.0000 mL | ORAL | Status: DC
  Administered 2017-07-04: 1000 mL
  Filled 2017-07-04 (×2): qty 1000

## 2017-07-04 MED ORDER — SODIUM CHLORIDE 0.45 % IV SOLN
INTRAVENOUS | Status: DC
  Administered 2017-07-04 – 2017-07-12 (×7): via INTRAVENOUS

## 2017-07-04 MED ORDER — PANTOPRAZOLE SODIUM 40 MG PO TBEC
40.0000 mg | DELAYED_RELEASE_TABLET | Freq: Every day | ORAL | Status: DC
  Filled 2017-07-04: qty 1

## 2017-07-04 MED ORDER — FREE WATER
200.0000 mL | Freq: Four times a day (QID) | Status: DC
  Administered 2017-07-04 – 2017-07-06 (×8): 200 mL

## 2017-07-04 MED ORDER — ASPIRIN 81 MG PO CHEW
81.0000 mg | CHEWABLE_TABLET | Freq: Every day | ORAL | Status: DC
  Administered 2017-07-05 – 2017-07-14 (×10): 81 mg
  Filled 2017-07-04 (×10): qty 1

## 2017-07-04 MED ORDER — PANTOPRAZOLE SODIUM 40 MG PO PACK
40.0000 mg | PACK | Freq: Every day | ORAL | Status: DC
  Administered 2017-07-04 – 2017-07-17 (×14): 40 mg
  Filled 2017-07-04 (×14): qty 20

## 2017-07-04 MED ORDER — METOPROLOL TARTRATE 100 MG PO TABS: 100.0000 mg | ORAL_TABLET | Freq: Two times a day (BID) | ORAL | Status: DC

## 2017-07-04 MED ORDER — LORATADINE 10 MG PO TABS
10.0000 mg | ORAL_TABLET | Freq: Every day | ORAL | Status: DC
  Filled 2017-07-04: qty 1

## 2017-07-04 NOTE — Progress Notes (Signed)
Spoke to pharmacy in reference to the Norvasc being given now and the pyxis saying it was due at 10 and 2200. Order just put in at 66. Not given. Tube feeding discontinued.

## 2017-07-04 NOTE — Progress Notes (Signed)
STROKE TEAM PROGRESS NOTE   SUBJECTIVE (INTERVAL HISTORY) His daughter and son are at the bedside. Pt transferred to floor yesterday but early this am, he was not arousable with tachypnea, rapid response called. Check O2 sat, CXR and CT head all negative. He was put on BiPAP and sent back to ICU. This morning, he was off BiPAP and awake alert and comparable neurologically and respiratory status stable. However, still has Afib RVR.   OBJECTIVE Temp:  [98.4 F (36.9 C)-100.4 F (38 C)] 99.2 F (37.3 C) (07/19 1157) Pulse Rate:  [40-122] 106 (07/19 1200) Cardiac Rhythm: Atrial fibrillation (07/19 1200) Resp:  [20-38] 27 (07/19 1200) BP: (117-154)/(66-107) 147/102 (07/19 1200) SpO2:  [92 %-100 %] 97 % (07/19 1200) FiO2 (%):  [40 %] 40 % (07/19 0900) Weight:  [192 lb 7.4 oz (87.3 kg)] 192 lb 7.4 oz (87.3 kg) (07/19 0456)  CBC:   Recent Labs Lab 07/03/17 0205 07/04/17 0613  WBC 9.9 14.9*  HGB 8.7* 9.3*  HCT 26.9* 29.8*  MCV 90.3 93.1  PLT 255 756    Basic Metabolic Panel:  Recent Labs Lab 06/29/17 0400  07/02/17 0406 07/03/17 0205 07/04/17 0538  NA 141  < > 145 146* 150*  K 3.4*  < > 3.5 3.3* 4.3  CL 111  < > 112* 110 116*  CO2 23  < > 24 26 24   GLUCOSE 184*  < > 166* 192* 193*  BUN 16  < > 22* 26* 54*  CREATININE 0.97  < > 0.81 0.90 1.99*  CALCIUM 7.4*  < > 8.0* 7.9* 8.0*  MG 1.7  --  2.2  --   --   PHOS 2.1*  --   --   --   --   < > = values in this interval not displayed.  Lipid Panel:     Component Value Date/Time   CHOL 115 06/27/2017 0500   TRIG 86 06/27/2017 0500   HDL 20 (L) 06/27/2017 0500   CHOLHDL 5.8 06/27/2017 0500   VLDL 17 06/27/2017 0500   LDLCALC 78 06/27/2017 0500   HgbA1c:  Lab Results  Component Value Date   HGBA1C 6.8 (H) 06/27/2017   Urine Drug Screen: No results found for: LABOPIA, COCAINSCRNUR, LABBENZ, AMPHETMU, THCU, LABBARB  Alcohol Level No results found for: [place] I have personally reviewed the radiological images  below and agree with the radiology interpretations.  Ct Abdomen Pelvis Wo Contrast 06/27/2017 1. Hematoma within the proximal RIGHT anterior thigh is incompletely imaged. Hematoma likely spontaneous related to anticoagulation. Recommend clinical correlation with extent of hematoma in the RIGHT thigh.  2. No intraperitoneal or retroperitoneal hemorrhage.  3. Extensive diverticulosis without diverticulitis.  4.  Aortic Atherosclerosis (ICD10-I70.0).  5. Small bilateral pleural effusions.    Ct Angio Head and neck W and Wo Contrast 06/26/2017 1. Emergent large vessel occlusion of the left M1 segment.  2. There is some reconstitution of anterior left MCA branch vessels from pial collaterals.  3. Dense calcifications at the carotid bifurcations bilaterally without significant stenoses relative to the more distal vessels.  4. Moderate narrowing of the proximal posterior cerebral arteries bilaterally, right greater than left.  5. Calcifications at great vessel origins along the arch without significant stenosis. 6. Hypoplastic left vertebral artery.  7. Multilevel cervical spinal spondylosis.  8. Emphysema.  9. Prior granulomatous disease in the right upper lobe.  Cerebral aneurysm and thrombectomy - Dr. Estanislado Pandy 06/26/2017 Status post endovascular complete revascularization of occluded left middle  cerebral artery with 1 pass with Solitaire 4 mm x 40 mm FR retrieval device and a 6 [place] intermediary guide catheter achieving a TICI 3 reperfusion.  Mr Brain Wo Contrast 06/27/2017 Completed acute infarction affecting the majority of the inferior division left MCA distribution. This affects the left temporal lobe and posterior frontal lobe with patchy involvement in the parietal region.  Infarction of the caudate and putamen as well.  Petechial blood product deposition without focal hematoma.  Swelling but no shift at this time.  Ct Cerebral Perfusion W Contrast 06/26/2017 Left M1 emergent  large vessel occlusion with associated acute left MCA territory infarct.  Core infarct is estimated at 49 mL.  The tissue at risk is estimated at 182 mL.   Ct Head Code Stroke W/o Cm 06/26/2017 1. Focal hyperdensity in left distal M1 may represent thrombus.  2. No vascular territory infarct identified at this time.  3. Small left frontal subdural hematoma is stable in size and attenuation from 2014.  4. ASPECTS is 10   06/29/2017 1. Evolving large LEFT MCA territory infarct (LEFT basal ganglia, LEFT temporoparietal lobes) with petechial hemorrhage, no hemorrhagic conversion.  2. Acute small volume LEFT cerebrum extra-axial blood products.  3. Chronic changes include old small LEFT frontal subdural hematoma and small LEFT frontal encephalomalacia.    Transthoracic echocardiogram 06/28/2017 Study Conclusions - Left ventricle: The cavity size was normal. There was mild   concentric hypertrophy. Systolic function was normal. The    estimated ejection fraction was in the range of 50% to 55%.   Hypokinesis of the apical inferior and akinesis of the basal to   mid inferior myocardium. Doppler parameters are consistent with   indeterminate ventricular filling pressure. - Aortic valve: Valve mobility was restricted. There was mild   stenosis. There was mild regurgitation. Peak velocity (S): 300   cm/s. Mean gradient (S): 19 mm Hg. Valve area (VTI): 1.64 cm^2.   Valve area (Vmax): 1.49 cm^2. Valve area (Vmean): 1.39 cm^2.   Regurgitation pressure half-time: 723 ms. - Mitral valve: Transvalvular velocity was within the normal range.   There was no evidence for stenosis. There was no regurgitation. - Left atrium: The atrium was severely dilated. - Right ventricle: The cavity size was normal. Wall thickness was   normal. Systolic function was normal. - Tricuspid valve: There was mild regurgitation. - Pulmonary arteries: Systolic pressure was within the normal   range. PA peak pressure: 27 mm Hg  (S)  Ct Head Wo Contrast 07/04/2017 IMPRESSION: 1. Unchanged extent of cytotoxic edema within the posterior left MCA territory and left basal ganglia with mass effect on the left lateral ventricle but no midline shift or hydrocephalus. 2. Decreased number of foci petechial hemorrhage. No hemorrhagic conversion/parenchymal hematoma.   Dg Chest Port 1 View 07/04/2017 IMPRESSION: 1. Retrocardiac opacity presumably on the basis of a left effusion and compressive atelectasis. A pneumonia is not entirely excluded. 2. Mild unchanged vascular congestion is again seen with cardiomegaly and post CABG change.   LE venous doppler - There is no obvious evidence of deep or superficial vein thrombosis involving the right and left lower extremities. All clearly visualized vessels appear patent and compressible. There is no evidence of Baker's cysts bilaterally.    PHYSICAL EXAM  Temp:  [98.4 F (36.9 C)-100.4 F (38 C)] 99.2 F (37.3 C) (07/19 1157) Pulse Rate:  [40-122] 106 (07/19 1200) Resp:  [20-38] 27 (07/19 1200) BP: (117-154)/(66-107) 147/102 (07/19 1200) SpO2:  [92 %-100 %] 97 % (  07/19 1200) FiO2 (%):  [40 %] 40 % (07/19 0900) Weight:  [192 lb 7.4 oz (87.3 kg)] 192 lb 7.4 oz (87.3 kg) (07/19 0456)  General - Well nourished, well developed, awake alert.  Ophthalmologic - Fundi not visualized due to noncooperation.  Cardiovascular - irregularly irregular heart rate and rhythm with RVR.  Extremities - right groin JP drain, would dress dry and clean.   Neuro - lethargic, but awake alert, off BiPAP, smiled to provider and his son, eyes open, not following commands except "close eyes", eye able to attend both sides, blinking to visual threat on the left but not inconsistent on the right. PERRL. Right facial droop, tongue midline in mouth. Spontaneous movement of LUE and LLE, at least 3/5 LUE and 2+/5 LLE. RUE and RLE mild withdraw on pain stimulation. DTR 1+ and right positive babinski. Sensation,  coordination and gait not tested.    ASSESSMENT/PLAN Mr. BRACEN SCHUM is a 83 y.o. male with history of prostate cancer, CAD, history of GI bleed, DM2, essential tremor, hypertension, history of atrial fibrillation and subdural hematoma, s/p craniectomy and clot evacuation who presented with right facial droop, dense right hemiplegia global aphasia, and left gaze deviation. He recevied tPA at 1507 on 06/26/2017.  CTA Occluded left M1 -> IR thrombectomy TICI 3 reperfusion with Dr. Estanislado Pandy.  Stroke: left MCA infarct due to left M1 occlusion s/p thrombectomy due to afib not on AC  Resultant  aphasia and right hemiplegia  CT head:  Focal hyperdensity in left distal M1 may represent thrombus. Small left frontal subdural hematoma is stable in size and attenuation from 2014.   MRI head: left MCA infarct. Petechial blood product deposition without focal hematoma.  CT Angio Head: Emergent large vessel occlusion of the left M1 segment. Dense calcifications at the carotid bifurcations bilaterally without significant stenoses  2D Echo - EF 50-55%. Left atrium severely dilated. No cardiac source of emboli identified.  LDL 78  HgbA1c 6.8  SCDs for VTE prophylaxis Diet NPO time specified  No antithrombotic prior to admission, now on No antithrombotic. Pt not on AC at baseline due to previous SDH s/p crani and evacuation. No AC for now due to large size of stroke, large groin hematoma s/p evacuation and dropping H&H. Currently H&H stable, will initiate ASA 81mg .   Patient counseled to be compliant with his antithrombotic medications  Ongoing aggressive stroke risk factor management  Therapy recommendations:  CIR  Disposition:   pending  Right femoral pseudoaneurysm with massive thigh hematoma s/p IR due to PFA tear   VVS on board, appreciate recs  requiring surgical suturing and clot evacuation  Dress dry and clean  JP drain for one more day  Afib not on [facility]  Not on AC at baseline due  to previous SDH s/p crani and evacuation  Currently not candidate for [facility] due to large size of stroke, large groin hematoma s/p evacuation and anemia.  On metoprolol for RVR  Add propranolol as per patient home medication  Cardiology consulted for afib RVR rate control  Respiratory distress  CCM on board  Off BiPAP  LE venous doppler no DVT  PE unlikely at this time  Hyperlipidemia  Home meds: pravastatin 40mg  PO daily  LDL 78, goal < 70  Statin resumed  Continue statin at discharge  Diabetes  HgbA1c 6.8, goal < 7.0  Controlled  Hyperglycemia  Resume home medications  SSI  Anemia, improved  Due to acute blood loss  Hb 9.0->8.2->7.8->7.8->8.7->8.7->9.3  Close monitoring  PRBC transfusion if needed  Other Stroke Risk Factors  Advanced age  Overweight, Body mass index is 28.42 kg/m., recommend weight loss, diet and exercise as appropriate   Family hx stroke (Mother)  CAD  Other Active Problems  Thrombocytopenia - 17 -> 96 -> 127->183->255->266  Leukocytosis - 11.1->9.3->7.4->13.1 -> 11.9->13.2->9.9->14.9  Mild hypokalemia - 3.4 -> 3.3 -> 3.2->3.5->3.3->4.3  Hypernatremia 146 -> 150  Elevated Cre - 0.9 -> 1.99  Elevated troponin - 0.11 -> cycling CEs  GI bleed history   Hospital day # 8  This patient is critically ill due to large left MCA infarct s/p IR, right groin hematoma s/p evacuation, afib RVR, anemia, respiratory failure and at significant risk of neurological worsening, death form recurrent stroke, hemorrhagic transformation, shock, heart failure, aspiration pneumonia. This patient's care requires constant monitoring of vital signs, hemodynamics, respiratory and cardiac monitoring, review of multiple databases, neurological assessment, discussion with family, other specialists and medical decision making of high complexity. I spent 40 minutes of neurocritical care time in the care of this patient. I had long discussion with  daughter and son at bedside, updated pt current condition, treatment plan and potential prognosis. They expressed understanding and appreciation.   Rosalin Hawking, MD PhD Stroke Neurology 07/04/2017 1:17 PM   To contact Stroke Continuity provider, please refer to [email or web address] After hours, contact General Neurology

## 2017-07-04 NOTE — Progress Notes (Signed)
PULMONARY / CRITICAL CARE MEDICINE   Name: Robert Keith MRN: [id number] DOB: [birth year]    ADMISSION DATE:  06/26/2017 CONSULTATION DATE:  06/26/17  REFERRING MD:  Dr. Leonie Man  CHIEF COMPLAINT:  CVA  HISTORY OF PRESENT ILLNESS:   83 year old man  presented 7/11 from home with witnessed onset of right facial droop, right hemiplegia and aphasia .  CT head showed a hyperdense left middle cerebral artery and chronic small left frontal subdural (unchanged from prior in 2014).  TPA was given   CTA showed left M1 occlusion and therefore patient taken to neuro IR for mechanical thrombectomy and revascularization.   past medical history of afib (not on NOAC 2/2 GI bleed), HTN, prior SDH in 2014 that required evacuation with residual small chronic SDH), DM, CAD, tremor, H. Pylori infection, and prostate cancer         STUDIES:  7/11 Head CT >> focal hyperdensity of left distal M1 may represent thrombus, no vascular territory infarct indentified, small left frontal SDH stable in size and attenuation from 2014, ASPECTS is 10 7/11 CTA head and neck>> emergent large vessel occlusion of left M1 segment, some reconstitution of anterior left MCA branch vessels from pial collaterals 7/12 CT abd >> hematoma in the anterior RIGHT thigh measuring 5.6 x 5.3 cm 7/12 MRI >> evolved lt MCA infarct 7/19 CT head >   CULTURES: Respiratory 7/15 >> few GNR, few GPC >>  Urine 7/15 >> negative Blood 7/15 >> 1 of 2 coag-negative staph >>   ANTIBIOTICS: 7/11 Ancef (pre-op) ceftaz (empiric) 7/14 >>  vanco (empiric) 7/14 >> 7/17  SIGNIFICANT EVENTS: 7/11  Admit  7/11 right femoral sheath found to be kinked, TPA reversal deemed to risky. Sheath pulled in PACU.  7/12 >> emergent OR for left groin hematoma , profunda femoral repaired 7/18 > transferred out of ICU 7/19 > increased WOB and change in mental status > PCCM asked to see again  LINES/TUBES: 7/11 ETT >> 7/16 7/11 OGT>> 7/16 7/11 Left Radial Aline >>  7/15 7/11 Right Femoral Sheath > 7/11  7/11 R groin JP drain >>>   SUBJECTIVE:  Transferred out of ICU last night then developed increased WOB with change in mental status / decreased level of consciousness.  Neuro transferring to ICU again and repeating head CT, PCCM called back. Per daughter, pt has been minimally responsive throughout the night.  Now essentially unresponsive.  ABG 7.48 / 33 / 66.   VITAL SIGNS: BP (!) 141/94 (BP Location: Right Arm)   Pulse (!) 116   Temp 99.4 F (37.4 C) (Axillary)   Resp 20   Ht 5\' 9"  (1.753 m)   Wt 89.1 kg (196 lb 6.9 oz)   SpO2 95%   BMI 29.01 kg/m   HEMODYNAMICS:    VENTILATOR SETTINGS:    INTAKE / OUTPUT: I/O last 3 completed shifts: In: 2306 [NG/GT:2056; IV Piggyback:250] Out: 2070 [Urine:1860; Drains:210]  PHYSICAL EXAMINATION: General: Elderly man, resting in bed, non responsive HEENT: PERRL, no JVD.  Neuro: Non-responsive.  Unable to arouse. CV: IRIR, tachy, no M/R/G. PULM: diminished bilateral bases but clear. GI: Soft, benign, positive bowel sounds Extremities: No deformities, no edema. Skin: No rash   LABS:  BMET  Recent Labs Lab 07/01/17 0240 07/02/17 0406 07/03/17 0205  NA 145 145 146*  K 3.2* 3.5 3.3*  CL 112* 112* 110  CO2 27 24 26   BUN 20 22* 26*  CREATININE 0.91 0.81 0.90  GLUCOSE 173* 166* 192*  Electrolytes  Recent Labs Lab 06/29/17 0400  07/01/17 0240 07/02/17 0406 07/03/17 0205  CALCIUM 7.4*  < > 7.6* 8.0* 7.9*  MG 1.7  --   --  2.2  --   PHOS 2.1*  --   --   --   --   < > = values in this interval not displayed.  CBC  Recent Labs Lab 07/01/17 0240 07/02/17 0406 07/03/17 0205  WBC 11.9* 13.2* 9.9  HGB 7.8* 8.7* 8.7*  HCT 23.5* 27.3* 26.9*  PLT 127* 183 255    Coag's  Recent Labs Lab 06/27/17 1515 06/27/17 1828  APTT 31 31  INR 1.67 1.59    Sepsis Markers  Recent Labs Lab 06/29/17 1734 06/29/17 2045 06/30/17 0005 06/30/17 0445 07/01/17 0240   LATICACIDVEN 2.4* 2.8*  --  3.1*  --   PROCALCITON  --   --  5.57 6.30 5.47    ABG  Recent Labs Lab 06/29/17 1701 06/30/17 0010 07/04/17 0255  PHART 7.423 7.475* 7.487*  PCO2ART 25.2* 29.9* 33.2  PO2ART 54.0* 65.2* 66.0*    Liver Enzymes No results for input(s): AST, ALT, ALKPHOS, BILITOT, ALBUMIN in the last 168 hours.  Cardiac Enzymes  Recent Labs Lab 06/27/17 1355  TROPONINI <0.03    Glucose  Recent Labs Lab 07/02/17 2304 07/03/17 0340 07/03/17 0759 07/03/17 1202 07/03/17 1556 07/03/17 1952  GLUCAP 207* 224* 246* 191* 190* 223*     DISCUSSION: 67 yoM with PMH of Afib- not on coag  With Left MCA-CVA s/p neuro IR. Right femoral pseudoaneurysm and thigh hematoma following sheath removal. Complicated by hemorrhagic shock, resolved. Extubated 7/16 and tolerating well.  Transferred out of ICU 7/18 but later developed mental status change along with increased WOB so neuro transferring back to ICU and PCCM called back.  ASSESSMENT / PLAN:   Tachypnea with increased WOB - neuro concerned about PE but doubtful as pt not hypoxic at all and clinical presentation with change in mental status and complete unresponsiveness does not fit.  Wells for me 3 (based on tachycardia and recent immobility); low probability.  Favor intracranial process.  If small chance this was PE, pt would not be a candidate for anticoagulation given large stroke and chronic SDH. Post op resp failure, VDRF (resolved).  Now DNI status. B infiltrate 7/14, favor Edema v TRALI (less likely PNA) - improved 7/16 w diuresis. DNI Status. P:   Start BiPAP now. NTS as needed. Based on patient's stated wishes, goals for care and on discussions with family he will not be reintubated if he were to decline after extubation.  Aspiration risk, SLP following.  Holding TF for now given need for BiPAP.  Acute encephalopathy - with change in mental status 7/19 (per daughter, pt had been minimally responsive from  the night prior).  Strong suspicion for acute intracranial process. Left M1 occlusion s/p TPA and mechanical thrombectomy w/revascularization  - with right hemiplegia, global aphasia and left gaze   Chronic small left frontal SDH from prior SDH in 2014 that required craniotomy.  P:  Neuro on board, repeating STAT head CT now. Further recs per neuro. Minimize sedating medications  FAMILY  - Updates: Updated daughter at bedside 7/19.  DNR / DNI status confirmed.  BiPAP OK.    - Inter-disciplinary family meet or Palliative Care meeting due by:  Done 7/12   CC time: 30 min.  Montey Hora, [facility] Pager: [phone]  or [phone]  [phone] 07/04/2017, 3:42 AM

## 2017-07-04 NOTE — Progress Notes (Signed)
S/O: Called to bedside due to tachypnea. The patient's respirations have been increasing since transfer from the ICU, now at 34 breaths/minute.  BP (!) 141/94 (BP Location: Right Arm)   Pulse (!) 116   Temp 99.4 F (37.4 C) (Axillary)   Resp 20   Ht 5\' 9"  (1.753 m)   Wt 89.1 kg (196 lb 6.9 oz)   SpO2 95%   BMI 29.01 kg/m   General Exam:  HEENT: Left skull depression from prior craniotomy Lungs: Tachypneic, RR = 34, wheezes bilaterally with somewhat diminished breath sounds bilaterally, worse at lung bases Ext: No pitting edema noted. Limbs noncyanotic without pallor. Right groin hematoma site appears stable.  Skin: Warm and diaphoretic  Neurological Exam: Mental Status: Somnolent to obtunded. Briefly opens eyes and gazes at examiner with verbal and tactile stimulation. Not following commands or attempting to communicate.  Cranial Nerves: PERRL. Brief saccade towards examiner but no visual tracking. Right facial droop.  Motor/Sensory: LUE with 2-3/5 movement to noxious LLE with weak withdrawal to noxious RUE trace movement to noxious RLE trace movement to noxious Deep Tendon Reflexes:   ABG: PH 7.49, PCO2 33.2 (borderline low), PO2 66 (low), Bicarb 24.9, Sats 94% on 1.5 L  Assessment/Plan: 83 year old male with large left MCA ischemic infarction, s/p thrombectomy. Complicated by large right groin hematoma. Now with acute onset of tachypnea.  1. Discussed case with CCM and I have requested a STAT consult. ABG consistent with decreased ability to oxygenate; blowing off CO2 due to tachypnea and with mild respiratory alkalosis. 2. STAT labs ordered: troponin, EKG, CXR, d-dimer. 3. CCM will determine if CTA chest is indicated to assess for possible PE. Based upon my assessment he scores 6 on Well's criteria, consistent with a 16 - 28% probability of PE.  4. Central neurogenic hyperventilaton also on DDx, but recent CT head was stable regarding the large left MCA infarction and was not  previously tachypneic, although respirations have been trending higher since Tuesday. Will get STAT CT head as well.   45 minutes spent in the emergent evaluation of acute respiratory changes in this critically ill patient  Electronically signed: Dr. Kerney Elbe

## 2017-07-04 NOTE — Progress Notes (Signed)
**  Preliminary report by tech**  Bilateral lower extremity venous duplex completed. There is no obvious evidence of deep or superficial vein thrombosis involving the right and left lower extremities. All clearly visualized vessels appear patent and compressible. There is no evidence of Baker's cysts bilaterally.  07/04/17 12:32 PM Robert Keith RVT

## 2017-07-04 NOTE — Significant Event (Signed)
Rapid Response Event Note  Overview: Time Called: 0257 Arrival Time: 0300 Event Type: Respiratory, Neurologic  Initial Focused Assessment:  Called by the 56M charge nurse for assistance transporting the pt to the ICU.  Dr. Cheral Marker at bedside prior to my arrival, order placed for ICU bed.  On exam, the pt is unresponsive to sternal rub, breathing 30-40 times/min with labored respirations.  Oxygen saturation remained 96% on 2L [place]. The pt was in A fib with a rate of 100-110. BP was adequate. Lung sounds were clear bilaterally.  Skin was pale, warm and dry with cap refill less than 3 seconds.  Continuous TF were stopped.    ABG as follows:  7.48, 33.2, 66, 24.9.    STAT head CT was conducted, results pending.   The pt's daughter was at the bedside throughout the encounter and was updated on the plan of care and the transfer to 2M16.         Event Summary: Name of Physician Notified: Dr. Cheral Marker at  (Prior to my arrival, MD at bedside.)  Name of Consulting Physician Notified: Montey Hora, [place] at 0310  Outcome: Transferred (Comment)  Event End Time: [place]  Pam Drown

## 2017-07-04 NOTE — Progress Notes (Signed)
Rehab admissions - Noted patient down in ICU at this time.  I met with patient, a caregiver and his son at the bedside.  Son is hopeful that patient will get better.  Not able to tolerate intense inpatient rehab therapies at this time.  I will follow along for progress and tolerance.  Call me for questions.  #[phone]

## 2017-07-04 NOTE — Progress Notes (Signed)
Daughter concerned re dad's breathing pattern. I called respiratory, and he suggested a blood gas, d/t the unresponsivness of patient. Paged Md. Will continue to monitor

## 2017-07-04 NOTE — Progress Notes (Signed)
Took pt off bipap and placed on 3 LNC per MD request. PT stable at this time, VS within normal limits. RN aware.

## 2017-07-04 NOTE — Progress Notes (Signed)
Tube feeding stopped per PA.

## 2017-07-04 NOTE — Progress Notes (Signed)
PT Cancellation Note  Patient Details Name: Robert Keith MRN: [id number] DOB: 1933-04-12   Cancelled Treatment:    Reason Eval/Treat Not Completed: Medical issues which prohibited therapy (Pt transferred to ICU due to unresponsive episode. Will  Check back as able.   )   Denice Paradise 07/04/2017, 9:17 AM  [facility] [phone] (pager)

## 2017-07-04 NOTE — Progress Notes (Signed)
SLP Cancellation Note  Patient Details Name: JAVIEL CANEPA MRN: [id number] DOB: 06/04/1933   Cancelled treatment:       Reason Eval/Treat Not Completed: Medical issues which prohibited therapy (On Bipap, decreased responsiveness)  Gabriel Rainwater MA, CCC-SLP [phone]  Gabriel Rainwater Meryl 07/04/2017, 8:36 AM

## 2017-07-04 NOTE — Progress Notes (Signed)
Dr. Truman Hayward came to the floor, assessed the patient, ordered non rebreather at 100%, respiratory paged, ABG completed Non rebreather put on, then taken off per MDs order.  Ordered pt to be transferred to ICU. Rapid Response called and are on the scene. Comfort given to daughter.

## 2017-07-04 NOTE — Progress Notes (Signed)
Nutrition Follow-up  DOCUMENTATION CODES:   Not applicable  INTERVENTION:    Resume TF with Osmolite 1.2 at 20 ml/h (trickle rate for now)   Provides 576 kcal, 27 gm protein, 394 ml free water daily  When okay to advance TF, goal rate is 60 ml/h with Pro-stat 30 ml BID to provide 1928 kcal, 109 gm protein, 1180 ml free water daily  NUTRITION DIAGNOSIS:   Inadequate oral intake related to inability to eat as evidenced by NPO status.  Ongoing  GOAL:   Patient will meet greater than or equal to 90% of their needs  Unmet  MONITOR:   TF tolerance, Weight trends, I & O's, Diet advancement  REASON FOR ASSESSMENT:   Consult Assessment of nutrition requirement/status  ASSESSMENT:   83 year old male with past medical history of afib (not on NOAC 2/2 GI bleed), HTN, prior SDH in 2014 that required evacuation with residual small chronic SDH), DM, CAD, tremor, H. Pylori infection, and prostate cancer who presented 7/11 from home with witnessed onset of right facial droop, right hemiplegia and aphasia at 1330.   Discussed patient in ICU rounds and with RN today. Patient became unresponsive and required transfer to the ICU for BiPAP last night. Suspected aspiration event. He has been off BiPAP since 8 am. Per discussion with CCM MD, okay to resume TF at trickle rate today, if tolerated, can advance to goal tomorrow. Cortrak tube in place with tip close to the pylorus.  Labs and medications reviewed.  Diet Order:  Diet NPO time specified  Skin:   (closed rt groin incision)  Last BM:  unknown  Height:   Ht Readings from Last 1 Encounters:  07/04/17 5\' 9"  (1.753 m)    Weight:   Wt Readings from Last 1 Encounters:  07/04/17 192 lb 7.4 oz (87.3 kg)    Ideal Body Weight:  72.7 kg  BMI:  Body mass index is 28.42 kg/m.  Estimated Nutritional Needs:   Kcal:  1900-2100  Protein:  105-115 grams  Fluid:  1.9-2.1 L  EDUCATION NEEDS:   No education needs identified at  this time  Molli Barrows, [place], [place], [place] Pager [phone] After Hours Pager [phone]

## 2017-07-04 NOTE — Consult Note (Signed)
Cardiology Consultation:   Patient ID: Robert Keith; [id number]; 07-Dec-1933   Admit date: 06/26/2017 Date of Consult: 07/04/2017  Primary Care Provider: Leonel Ramsay, MD Primary Cardiologist: Dr. Rockey Situ  Chief Complaint: fall, facial droop, right hemiplegia, aphasia  Patient Profile:   Robert Keith is a 83 y.o. male with a hx of CAD s/p CABG 1996 at [facility], DM, HTN, SDH while on aspirin requiring craniotomy (date unclear, remote OP neuro notes remotely state drained around 02/2010), prostate CA, UGIB/ABL anemia 09/2016, chronic atrial fibrillation (first noted in 2016, has not been on anticoag due to h/o brain bleeding), RBBB, prior EF 45% by nuc 11/2015 with inferior ischemia (cath deferred due to lack of CP) who is being seen today for the evaluation of rapid atrial fib at the request of Dr. Erlinda Hong.  History of Present Illness:   He was admitted 06/26/17 with witnessed onset of fall with right facial droop, right hemiplegia and aphasia.   TPA was given after careful discussion with the family. CTA showed left M1 occlusion and therefore patient taken to neuro IR for mechanical thrombectomy and revascularization. During post-operative state, right femoral sheath found to be kinked, TPA reversal deemed to risky. He was intubated for procedure. Prognosis felt overall poor due to size of stroke. He began decompensating with evidence of hemorrhagic shock on 06/27/17 and was found to have rapid growth of hematoma in right groin requiring emergent repair of right profunda femoral artery and evacuation of thigh hematoma. He required volume rescuscitation including blood, plasma, cryoprecipitate, platelets and vasopressors. Subsequent course notable for both intermittent bradycardia (HR 40s-50s, tachycardia, HR 140s), tachypnea, metabolic acidosis, elevated lactate, fever, possible HCAP (empiric abx) with possible component of septic shock, with PCCM later suspecting that the lateral perihilar infiltrates  were pulmonary edema (consider TRALI) as opposed to infection. He's also had hypocalcemia, hypokalemia, AKI related to hypotension. Extubated 7/16. Earlier today, developed unresponsiveness, tachypnea, acute respiratory distress requiring placement of bipap. LE venous duplex negative.CT head today showed unchanged extent of cytotoxic edema within the posterior left MCA territory and left basal ganglia with mass effect on the left lateral ventricle but no midline shift or hydrocephalus. CXR "Retrocardiac opacity presumably on the basis of a left effusion and compressive atelectasis. pneumonia is not entirely excluded. Mild unchanged vascular congestion is again seen with cardiomegaly and post CABG change." Last WBC 14.9, Hgb 9.3, Na 150, BUN 54, Cr 1.99. From cardiac perspective, back on amlodipine (resumed today per PCCM), also on free water, lopressor 100mg  bid, pravastatin, 75/hr 1/2 NS.  PCCM also resumed aspirin due to suspected demand ischemia troponin 0.11. 2D echo 06/28/17 EF 50-55%, Hypokinesis of the apical inferior and akinesis of the basal to mid inferior myocardium, mild AS/mild AI, severe LAE, mild TR.  Cardiology consulted for management of atrial fib RVR. HR was 120s-130s earlier today, currently 95-110. Patient remains aphasic, smiles and smirks occasionally per family, has said "hi" to one family member earlier today, unable to obtain hx from patient.   Past Medical History:  Diagnosis Date  . Anemia   . Cancer [facility])    prostate  . Chronic atrial fibrillation ([place])   . Coronary artery disease    a. s/p CABG [facility] 1996.  . Diabetes mellitus without complication ([place])   . Essential tremor   . GI bleed 09/2016  . H. pylori infection   . H/O craniotomy   . Hypertension   . Intracranial hemorrhage ([place])   . Prostate cancer ([place])   .  RBBB   . SDH (subdural hematoma) ([facility])     Past Surgical History:  Procedure Laterality Date  . brain surgery for bleed    . cardiac bypass    .  CORONARY ARTERY BYPASS GRAFT    . ESOPHAGOGASTRODUODENOSCOPY (EGD) WITH PROPOFOL N/A 09/18/2016   Procedure: ESOPHAGOGASTRODUODENOSCOPY (EGD) WITH PROPOFOL;  Surgeon: [name], MD;  Location: [facility];  Service: Endoscopy;  Laterality: N/A;  . FEMORAL ARTERY EXPLORATION Right 06/27/2017   Procedure: FEMORAL ARTERY EXPLORATION;  Surgeon: Conrad Connersville, MD;  Location: [facility];  Service: Vascular;  Laterality: Right;  . IR PERCUTANEOUS ART THROMBECTOMY/INFUSION INTRACRANIAL INC DIAG ANGIO  06/26/2017  . RADIOLOGY WITH ANESTHESIA N/A 06/26/2017   Procedure: RADIOLOGY WITH ANESTHESIA CODE STROKE;  Surgeon: [name], MD;  Location: [facility];  Service: Radiology;  Laterality: N/A;     Inpatient Medications: Scheduled Meds: . albuterol  2.5 mg Nebulization BID  . amLODipine  5 mg Oral Daily  . aspirin  81 mg Per Tube Daily  . chlorhexidine  15 mL Mouth Rinse BID  . feeding supplement (PRO-STAT SUGAR FREE 64)  30 mL Per Tube BID  . free water  200 mL Per Tube Q6H  . glipiZIDE  10 mg Oral BID WC  . insulin aspart  0-15 Units Subcutaneous Q4H  . mouth rinse  15 mL Mouth Rinse q12n4p  . metoprolol tartrate  100 mg Oral BID  . pantoprazole sodium  40 mg Per Tube Daily  . pioglitazone  15 mg Oral Q breakfast  . pravastatin  40 mg Oral QHS   Continuous Infusions: . sodium chloride 75 mL/hr at 07/04/17 0943  . sodium chloride    . cefTAZidime (FORTAZ)  IV Stopped (07/04/17 1127)  . feeding supplement (OSMOLITE 1.2 CAL) 1,000 mL (07/04/17 1435)   PRN Meds: sodium chloride, acetaminophen **OR** acetaminophen (TYLENOL) oral liquid 160 mg/5 mL **OR** acetaminophen, fentaNYL (SUBLIMAZE) injection, metoprolol tartrate, ondansetron (ZOFRAN) IV, senna-docusate  Allergies:    Allergies  Allergen Reactions  . Metformin And Related Diarrhea  . Tape Other (See Comments)    PLEASE USE COBAN WRAP; PATIENT'S SKIN IS THIN AND WILL TEAR EASILY!!  . Bupropion Other (See Comments)    Unknown    . Fenofibrate Micronized Other (See Comments)    Unknown  . Gemfibrozil Other (See Comments)    Unknown  . Niacin Other (See Comments)    Impotence   . Phenobarbital Other (See Comments)    Confusion   . Simvastatin Other (See Comments)    Myalgia    Social History:   Social History   Social History  . Marital status: Married    Spouse name: N/A  . Number of children: N/A  . Years of education: N/A   Occupational History  . Not on file.   Social History Main Topics  . Smoking status: Former Research scientist (life sciences)  . Smokeless tobacco: Never Used  . Alcohol use No  . Drug use: No  . Sexual activity: Not on file   Other Topics Concern  . Not on file   Social History Narrative   As of 09/2016 the patient has been a widow for about 7 years. He has caretakers present at his home around the clock. However he is independent to bathe and dress himself.    Family History:   The patient's family history includes Heart attack in his father; Stroke in his mother.  ROS:  Unable to fully assess as patient is aphasic  Physical Exam/Data:   Vitals:   07/04/17 1200 07/04/17 1300 07/04/17 1400 07/04/17 1602  BP: (!) 147/102 122/62 119/89   Pulse: (!) 106 (!) 115 96   Resp: (!) 27 (!) 27 (!) 30   Temp:    98.9 F (37.2 C)  TempSrc:    Axillary  SpO2: 97% 99% 93%   Weight:      Height:        Intake/Output Summary (Last 24 hours) at 07/04/17 1617 Last data filed at 07/04/17 1300  Gross per 24 hour  Intake           686.25 ml  Output              435 ml  Net           251.25 ml   Filed Weights   07/02/17 0300 07/03/17 0500 07/04/17 0456  Weight: 200 lb 2.8 oz (90.8 kg) 196 lb 6.9 oz (89.1 kg) 192 lb 7.4 oz (87.3 kg)   Body mass index is 28.42 kg/m.  General: Well developed, well nourished elderly WM, in no acute distress. Head: Normocephalic, atraumatic, sclera non-icteric, no xanthomas, nares are without discharge.  Neck: Negative for carotid bruits. JVD not elevated. Lungs:  Clear bilaterally to auscultation without wheezes, rales, or rhonchi. Breathing is unlabored. Heart: Irregularly irregular, borderline tachycardic, with S1 S2. No murmurs, rubs, or gallops appreciated. Abdomen: Soft, non-tender, non-distended with normoactive bowel sounds. No hepatomegaly. No rebound/guarding. No obvious abdominal masses. Msk: R sided weakness Extremities: No clubbing or cyanosis. No edema.  Distal pedal pulses are 2+ and equal bilaterally. Neuro: Alert with eyes open and seems to be able to nod to some questions but not reliably following commands. R facial droop Psych:  Unable to assess as patient is aphasic  EKG:  The EKG was personally reviewed and demonstrates atrial fib RVR with RBBB, occ PVCs versus abberrant conduction  Relevant CV Studies: 2D Echo 06/28/17 - Left ventricle: The cavity size was normal. There was mild   concentric hypertrophy. Systolic function was normal. The   estimated ejection fraction was in the range of 50% to 55%.   Hypokinesis of the apical inferior and akinesis of the basal to   mid inferior myocardium. Doppler parameters are consistent with   indeterminate ventricular filling pressure. - Aortic valve: Valve mobility was restricted. There was mild   stenosis. There was mild regurgitation. Peak velocity (S): 300   cm/s. Mean gradient (S): 19 mm Hg. Valve area (VTI): 1.64 cm^2.   Valve area (Vmax): 1.49 cm^2. Valve area (Vmean): 1.39 cm^2.   Regurgitation pressure half-time: 723 ms. - Mitral valve: Transvalvular velocity was within the normal range.   There was no evidence for stenosis. There was no regurgitation. - Left atrium: The atrium was severely dilated. - Right ventricle: The cavity size was normal. Wall thickness was   normal. Systolic function was normal. - Tricuspid valve: There was mild regurgitation. - Pulmonary arteries: Systolic pressure was within the normal   range. PA peak pressure: 27 mm Hg (S).  Laboratory  Data:  Chemistry Recent Labs Lab 07/02/17 0406 07/03/17 0205 07/04/17 0538  NA 145 146* 150*  K 3.5 3.3* 4.3  CL 112* 110 116*  CO2 24 26 24   GLUCOSE 166* 192* 193*  BUN 22* 26* 54*  CREATININE 0.81 0.90 1.99*  CALCIUM 8.0* 7.9* 8.0*  GFRNONAA >60 >60 30*  GFRAA >60 >60 34*  ANIONGAP 9 10 10     No results for  input(s): PROT, ALBUMIN, AST, ALT, ALKPHOS, BILITOT in the last 168 hours. Hematology Recent Labs Lab 07/02/17 0406 07/03/17 0205 07/04/17 0613  WBC 13.2* 9.9 14.9*  RBC 3.00* 2.98* 3.20*  HGB 8.7* 8.7* 9.3*  HCT 27.3* 26.9* 29.8*  MCV 91.0 90.3 93.1  MCH 29.0 29.2 29.1  MCHC 31.9 32.3 31.2  RDW 16.6* 16.8* 17.0*  PLT 183 255 266   Cardiac Enzymes Recent Labs Lab 07/04/17 0538  TROPONINI 0.11*   No results for input(s): TROPIPOC in the last 168 hours.  BNPNo results for input(s): BNP, PROBNP in the last 168 hours.  DDimer  Recent Labs Lab 07/04/17 0538  DDIMER 9.68*    Radiology/Studies:  Ct Head Wo Contrast  Result Date: 07/04/2017 CLINICAL DATA:  Stroke. EXAM: CT HEAD WITHOUT CONTRAST TECHNIQUE: Contiguous axial images were obtained from the base of the skull through the vertex without intravenous contrast. COMPARISON:  Head CT 06/29/2017 FINDINGS: Brain: There is persistent hypoattenuation throughout most of the posterior left MCA territory and within the left basal ganglia. There is persistent mass effect on the left lateral ventricle. No midline shift. Decreased amount of petechial hemorrhage compared to the prior study. No hemorrhagic conversion. No extra-axial collection. No hydrocephalus. There is periventricular hypoattenuation and generalized volume loss. Vascular: No hyperdense vessel sign. Skull: No acute abnormality.  Left parietal burr hole. Sinuses/Orbits: Paranasal sinuses, mastoids and middle ears are clear. Orbits are normal. Other: None. IMPRESSION: 1. Unchanged extent of cytotoxic edema within the posterior left MCA territory and left  basal ganglia with mass effect on the left lateral ventricle but no midline shift or hydrocephalus. 2. Decreased number of foci petechial hemorrhage. No hemorrhagic conversion/parenchymal hematoma. Electronically Signed   By: Ulyses Jarred M.D.   On: 07/04/2017 05:09   Dg Chest Port 1 View  Result Date: 07/04/2017 CLINICAL DATA:  Tachypnea and history of recent stroke. EXAM: PORTABLE CHEST 1 VIEW COMPARISON:  07/03/2017 FINDINGS: Stable cardiomegaly with post CABG change. Aortic atherosclerosis at the arch and ascending aorta. A feeding tube extends below the left hemidiaphragm. The tip is excluded on current exam. Hazy opacity at the left lung base is again noted consistent with a small layering effusion. Mild vascular congestion is seen without significant change. Probable left compressive atelectasis secondary to left effusion. Superimposed pneumonia is not entirely excluded. IMPRESSION: 1. Retrocardiac opacity presumably on the basis of a left effusion and compressive atelectasis. A pneumonia is not entirely excluded. 2. Mild unchanged vascular congestion is again seen with cardiomegaly and post CABG change. Electronically Signed   By: Ashley Royalty M.D.   On: 07/04/2017 03:17   Dg Chest Port 1 View  Result Date: 07/03/2017 CLINICAL DATA:  Pleural effusion. EXAM: PORTABLE CHEST 1 VIEW COMPARISON:  07/02/2017 . FINDINGS: Feeding tube noted with tip below left hemidiaphragm. Prior CABG. Cardiomegaly. Mild bilateral pulmonary interstitial prominence noted suggesting pulmonary interstitial edema. Left base atelectasis . Small left pleural effusion . IMPRESSION: 1. Feeding tube noted with tip below left hemidiaphragm. 2. Prior CABG. Cardiomegaly with mild bilateral from interstitial edema and small left pleural effusion. 3. Left lung base atelectasis . Electronically Signed   By: Marcello Moores  Register   On: 07/03/2017 09:51   Dg Chest Port 1 View  Result Date: 07/02/2017 CLINICAL DATA:  83 year old male with  acute respiratory failure. Subsequent encounter. EXAM: PORTABLE CHEST 1 VIEW COMPARISON:  07/01/2017. FINDINGS: Endotracheal tube removed. Feeding tube remains in place with tip not imaged. Post CABG.  Cardiomegaly. Consolidation left base may  represent atelectasis with pleural effusion layering posteriorly contributing to hazy appearance of left thorax. Basilar atelectasis or infiltrate not excluded. Pulmonary vascular prominence most notable centrally. Calcified aorta. Probable granuloma right upper lobe. No acute osseous abnormality. IMPRESSION: Endotracheal tube removed. Cardiomegaly post CABG. Persistent consolidation left base may represent pleural fluid with atelectasis. Pleural fluid may be layering posteriorly contributing to hazy appearance of left thorax. Pulmonary vascular prominence most notable centrally. Aortic atherosclerosis. Electronically Signed   By: Genia Del M.D.   On: 07/02/2017 07:21   Dg Chest Port 1 View  Result Date: 07/01/2017 CLINICAL DATA:  Intubation . EXAM: PORTABLE CHEST 1 VIEW COMPARISON:  06/29/2017. FINDINGS: Endotracheal tube and feeding tube in stable position. Prior CABG. Stable cardiomegaly with persistent but improved bilateral pulmonary infiltrates suggesting improving CHF. Small left pleural effusion again noted. No pneumothorax . IMPRESSION: 1. Lines and tubes in stable position. 2. Prior CABG. Cardiomegaly with improving bilateral pulmonary infiltrates suggesting improving pulmonary edema. Small left pleural effusion again noted . Electronically Signed   By: Marcello Moores  Register   On: 07/01/2017 07:05    Assessment and Plan:   1. Acute stroke tx with TPA/mechanical revascularization (prior SDH s/p craniotomy), complicated post-hospital course as above - being managed by IM, PCCM.  2. Chronic atrial fib with elevated HR - rate remains mildly elevated in the 95-110 range. He is on Lopressor 100mg  BID and was also started back on home amlodipine today. HR does not  appear significantly uncontrolled for the degree of medical stressors. Will discuss titration of beta blocker versus transition from amlodipine to diltiazem with MD. Not previously an [facility] candidate due to prior SDH as well as GIB and ABL anemia. Per neuro, No AC for now due to large size of stroke, large groin hematoma s/p evacuation and decreasing H/H this admission.  3. CAD s/p CABG 1996 - low level troponin likely demand process, would not pursue further workup. EF relatively preserved and stable by echo this admission at 50-55%. Remote nuc showed EF 45%, managed medically, with echo 09/2016 showing EF 50-55%.  Signed, Charlie Pitter, PA-C  07/04/2017 4:17 PM

## 2017-07-04 NOTE — Progress Notes (Signed)
Report given to [facility] 33M . CT called . Transport coming for patient.

## 2017-07-04 NOTE — Progress Notes (Signed)
   Daily Progress Note  Overnight events noted.  Keep drains one more day.  D/C tomorrow   Adele Barthel, MD, FACS [facility] [place] Office: [phone] Pager: [phone]  07/04/2017, 11:51 AM

## 2017-07-05 ENCOUNTER — Inpatient Hospital Stay (HOSPITAL_COMMUNITY): Payer: Medicare Other

## 2017-07-05 DIAGNOSIS — D72829 Elevated white blood cell count, unspecified: Secondary | ICD-10-CM

## 2017-07-05 DIAGNOSIS — N179 Acute kidney failure, unspecified: Secondary | ICD-10-CM

## 2017-07-05 DIAGNOSIS — E870 Hyperosmolality and hypernatremia: Secondary | ICD-10-CM

## 2017-07-05 DIAGNOSIS — J90 Pleural effusion, not elsewhere classified: Secondary | ICD-10-CM

## 2017-07-05 LAB — GLUCOSE, CAPILLARY
GLUCOSE-CAPILLARY: 190 mg/dL — AB (ref 65–99)
Glucose-Capillary: 154 mg/dL — ABNORMAL HIGH (ref 65–99)
Glucose-Capillary: 164 mg/dL — ABNORMAL HIGH (ref 65–99)
Glucose-Capillary: 177 mg/dL — ABNORMAL HIGH (ref 65–99)
Glucose-Capillary: 163 mg/dL — ABNORMAL HIGH (ref 65–99)

## 2017-07-05 LAB — CBC
HCT: 29.4 % — ABNORMAL LOW (ref 39.0–52.0)
Hemoglobin: 9 g/dL — ABNORMAL LOW (ref 13.0–17.0)
MCH: 28.8 pg (ref 26.0–34.0)
MCHC: 30.6 g/dL (ref 30.0–36.0)
MCV: 93.9 fL (ref 78.0–100.0)
PLATELETS: 252 10*3/uL (ref 150–400)
RBC: 3.13 MIL/uL — ABNORMAL LOW (ref 4.22–5.81)
RDW: 17.3 % — AB (ref 11.5–15.5)
WBC: 13.9 10*3/uL — AB (ref 4.0–10.5)

## 2017-07-05 LAB — BASIC METABOLIC PANEL
Anion gap: 10 (ref 5–15)
BUN: 73 mg/dL — ABNORMAL HIGH (ref 6–20)
CO2: 24 mmol/L (ref 22–32)
Calcium: 8 mg/dL — ABNORMAL LOW (ref 8.9–10.3)
Chloride: 114 mmol/L — ABNORMAL HIGH (ref 101–111)
Creatinine, Ser: 2.93 mg/dL — ABNORMAL HIGH (ref 0.61–1.24)
GFR calc Af Amer: 21 mL/min — ABNORMAL LOW (ref >60)
GFR calc non Af Amer: 19 mL/min — ABNORMAL LOW (ref >60)
Glucose, Bld: 165 mg/dL — ABNORMAL HIGH (ref 65–99)
Potassium: 4 mmol/L (ref 3.5–5.1)
Sodium: 148 mmol/L — ABNORMAL HIGH (ref 135–145)

## 2017-07-05 LAB — CULTURE, BLOOD (ROUTINE X 2)
CULTURE: NO GROWTH
Special Requests: ADEQUATE

## 2017-07-05 LAB — MAGNESIUM: Magnesium: 2.8 mg/dL — ABNORMAL HIGH (ref 1.7–2.4)

## 2017-07-05 LAB — PHOSPHORUS: PHOSPHORUS: 5.1 mg/dL — AB (ref 2.5–4.6)

## 2017-07-05 MED ORDER — PIOGLITAZONE HCL 15 MG PO TABS
15.0000 mg | ORAL_TABLET | Freq: Every day | ORAL | Status: DC
  Administered 2017-07-05 – 2017-07-17 (×13): 15 mg
  Filled 2017-07-05 (×14): qty 1

## 2017-07-05 MED ORDER — METOPROLOL TARTRATE 50 MG PO TABS
100.0000 mg | ORAL_TABLET | Freq: Two times a day (BID) | ORAL | Status: DC
  Administered 2017-07-05 – 2017-07-17 (×24): 100 mg
  Filled 2017-07-05 (×7): qty 2
  Filled 2017-07-05: qty 1
  Filled 2017-07-05 (×17): qty 2
  Filled 2017-07-05: qty 1

## 2017-07-05 MED ORDER — SENNOSIDES-DOCUSATE SODIUM 8.6-50 MG PO TABS
1.0000 | ORAL_TABLET | Freq: Every evening | ORAL | Status: DC | PRN
  Filled 2017-07-05: qty 1

## 2017-07-05 MED ORDER — PRAVASTATIN SODIUM 40 MG PO TABS
40.0000 mg | ORAL_TABLET | Freq: Every day | ORAL | Status: DC
  Administered 2017-07-05 – 2017-07-16 (×12): 40 mg
  Filled 2017-07-05 (×13): qty 1

## 2017-07-05 MED ORDER — GLIPIZIDE 5 MG PO TABS
10.0000 mg | ORAL_TABLET | Freq: Two times a day (BID) | ORAL | Status: DC
  Administered 2017-07-05 – 2017-07-17 (×25): 10 mg
  Filled 2017-07-05 (×6): qty 1
  Filled 2017-07-05 (×3): qty 2
  Filled 2017-07-05 (×6): qty 1
  Filled 2017-07-05: qty 2
  Filled 2017-07-05: qty 1
  Filled 2017-07-05: qty 2
  Filled 2017-07-05: qty 1
  Filled 2017-07-05: qty 2
  Filled 2017-07-05 (×2): qty 1
  Filled 2017-07-05: qty 2
  Filled 2017-07-05 (×3): qty 1

## 2017-07-05 MED ORDER — AMLODIPINE BESYLATE 5 MG PO TABS
5.0000 mg | ORAL_TABLET | Freq: Every day | ORAL | Status: DC
  Administered 2017-07-05 – 2017-07-17 (×13): 5 mg
  Filled 2017-07-05 (×12): qty 1

## 2017-07-05 MED ORDER — OSMOLITE 1.2 CAL PO LIQD
1000.0000 mL | ORAL | Status: DC
  Administered 2017-07-05 – 2017-07-16 (×13): 1000 mL
  Filled 2017-07-05 (×16): qty 1000

## 2017-07-05 NOTE — Progress Notes (Signed)
Unable to do CPT at this time as pt is sitting up in chair visiting with family. RT will complete CPT when pt is back in bed. RN aware. RT will continue to monitor.

## 2017-07-05 NOTE — Progress Notes (Addendum)
Progress Note  SUBJECTIVE:    POD #8  Awake.   OBJECTIVE:   Vitals:   07/05/17 0600 07/05/17 0804  BP: 118/72   Pulse: (!) 102   Resp: (!) 30   Temp:  97.7 F (36.5 C)    Intake/Output Summary (Last 24 hours) at 07/05/17 0809 Last data filed at 07/05/17 0600  Gross per 24 hour  Intake          2561.25 ml  Output              925 ml  Net          1636.25 ml   Right groin staples intact. Right groin is soft with residual ecchymosis.  JP drains in place with <10 cc drainage each.  Faintly palpable right DP. 2+ left DP. No ischemic changes to feet.   ASSESSMENT/PLAN:   83 y.o. male is s/p: right groin exploration and evacuation of thigh hematoma 8 Days Post-Op   JP drains with 40cc and 60cc total daily output yesterday. Removed this am.  Staples out in 10-14 days post op.   [place]via [place] 07/05/2017 8:09 AM -- LABS:   CBC    Component Value Date/Time   WBC 13.9 (H) 07/05/2017 0235   HGB 9.0 (L) 07/05/2017 0235   HGB 15.2 12/22/2014 1430   HCT 29.4 (L) 07/05/2017 0235   HCT 46.1 12/22/2014 1430   PLT 252 07/05/2017 0235   PLT 194 12/22/2014 1430    BMET    Component Value Date/Time   NA 148 (H) 07/05/2017 0235   NA 142 12/22/2014 1430   K 4.0 07/05/2017 0235   K 4.3 12/22/2014 1430   CL 114 (H) 07/05/2017 0235   CL 108 (H) 12/22/2014 1430   CO2 24 07/05/2017 0235   CO2 27 12/22/2014 1430   GLUCOSE 165 (H) 07/05/2017 0235   GLUCOSE 119 (H) 12/22/2014 1430   BUN 73 (H) 07/05/2017 0235   BUN 20 (H) 12/22/2014 1430   CREATININE 2.93 (H) 07/05/2017 0235   CREATININE 1.13 12/22/2014 1430   CALCIUM 8.0 (L) 07/05/2017 0235   CALCIUM 8.8 12/22/2014 1430   GFRNONAA 19 (L) 07/05/2017 0235   GFRNONAA >60 12/22/2014 1430   GFRAA 21 (L) 07/05/2017 0235   GFRAA >60 12/22/2014 1430    COAG Lab Results  Component Value Date   INR 1.59 06/27/2017   INR 1.67 06/27/2017   INR 1.13 06/26/2017   No results found for: PTT  ANTIBIOTICS:    Anti-infectives    Start     Dose/Rate Route Frequency Ordered Stop   06/30/17 1300  vancomycin (VANCOCIN) 1,250 mg in sodium chloride 0.9 % 250 mL IVPB  Status:  Discontinued     1,250 mg 166.7 mL/hr over 90 Minutes Intravenous Every 12 hours 06/29/17 2351 07/02/17 0907   06/30/17 0030  cefTAZidime (FORTAZ) 1 g in dextrose 5 % 50 mL IVPB     1 g 100 mL/hr over 30 Minutes Intravenous Every 8 hours 06/29/17 2350     06/30/17 0000  vancomycin (VANCOCIN) 2,000 mg in sodium chloride 0.9 % 500 mL IVPB     2,000 mg 250 mL/hr over 120 Minutes Intravenous  Once 06/29/17 2350 06/30/17 0336   06/26/17 1627  ceFAZolin (ANCEF) 2-4 GM/100ML-% IVPB    Comments:  Desiree Hane   : cabinet override      06/26/17 1627 06/27/17 [place], PA-C [facility] Office:  [phone] Pager: [phone] 07/05/2017 8:09 AM  Addendum  As noted above.  Staples out next week if incision healing adequately.  Adele Barthel, MD, FACS [facility] [place] Office: [phone] Pager: [phone]  07/05/2017, 3:41 PM

## 2017-07-05 NOTE — Progress Notes (Addendum)
STROKE TEAM PROGRESS NOTE   SUBJECTIVE (INTERVAL HISTORY) His daughters and son are at the bedside. Pt awake alert, no respiratory distress. Right groin drain removed. Discussed with CCM team on the hallway. Pt has no acute event overnight.  CXR no change. Still has secretions needing suctioning. However, his Cre continue to climb up, today Cre 2.93, Na 148. Will continue IVF and add free water. Currently on 1/2NS for hypernatremia.   OBJECTIVE Temp:  [97.7 F (36.5 C)-99.9 F (37.7 C)] 99.9 F (37.7 C) (07/20 1154) Pulse Rate:  [67-103] 101 (07/20 1000) Cardiac Rhythm: Atrial fibrillation (07/20 0900) Resp:  [23-30] 24 (07/20 1000) BP: (110-140)/(67-96) 125/70 (07/20 1000) SpO2:  [91 %-100 %] 99 % (07/20 1000) Weight:  [193 lb 5.5 oz (87.7 kg)] 193 lb 5.5 oz (87.7 kg) (07/20 0500)  CBC:   Recent Labs Lab 07/04/17 0613 07/05/17 0235  WBC 14.9* 13.9*  HGB 9.3* 9.0*  HCT 29.8* 29.4*  MCV 93.1 93.9  PLT 266 962    Basic Metabolic Panel:  Recent Labs Lab 06/29/17 0400  07/02/17 0406  07/04/17 0538 07/05/17 0235  NA 141  < > 145  < > 150* 148*  K 3.4*  < > 3.5  < > 4.3 4.0  CL 111  < > 112*  < > 116* 114*  CO2 23  < > 24  < > 24 24  GLUCOSE 184*  < > 166*  < > 193* 165*  BUN 16  < > 22*  < > 54* 73*  CREATININE 0.97  < > 0.81  < > 1.99* 2.93*  CALCIUM 7.4*  < > 8.0*  < > 8.0* 8.0*  MG 1.7  --  2.2  --   --  2.8*  PHOS 2.1*  --   --   --   --  5.1*  < > = values in this interval not displayed.  Lipid Panel:     Component Value Date/Time   CHOL 115 06/27/2017 0500   TRIG 86 06/27/2017 0500   HDL 20 (L) 06/27/2017 0500   CHOLHDL 5.8 06/27/2017 0500   VLDL 17 06/27/2017 0500   LDLCALC 78 06/27/2017 0500   HgbA1c:  Lab Results  Component Value Date   HGBA1C 6.8 (H) 06/27/2017   Urine Drug Screen: No results found for: LABOPIA, COCAINSCRNUR, LABBENZ, AMPHETMU, THCU, LABBARB  Alcohol Level No results found for: [place] I have personally reviewed the  radiological images below and agree with the radiology interpretations.  Ct Abdomen Pelvis Wo Contrast 06/27/2017 1. Hematoma within the proximal RIGHT anterior thigh is incompletely imaged. Hematoma likely spontaneous related to anticoagulation. Recommend clinical correlation with extent of hematoma in the RIGHT thigh.  2. No intraperitoneal or retroperitoneal hemorrhage.  3. Extensive diverticulosis without diverticulitis.  4.  Aortic Atherosclerosis (ICD10-I70.0).  5. Small bilateral pleural effusions.    Ct Angio Head and neck W and Wo Contrast 06/26/2017 1. Emergent large vessel occlusion of the left M1 segment.  2. There is some reconstitution of anterior left MCA branch vessels from pial collaterals.  3. Dense calcifications at the carotid bifurcations bilaterally without significant stenoses relative to the more distal vessels.  4. Moderate narrowing of the proximal posterior cerebral arteries bilaterally, right greater than left.  5. Calcifications at great vessel origins along the arch without significant stenosis. 6. Hypoplastic left vertebral artery.  7. Multilevel cervical spinal spondylosis.  8. Emphysema.  9. Prior granulomatous disease in the right upper lobe.  Cerebral  aneurysm and thrombectomy - Dr. Estanislado Pandy 06/26/2017 Status post endovascular complete revascularization of occluded left middle cerebral artery with 1 pass with Solitaire 4 mm x 40 mm FR retrieval device and a 6 [place] intermediary guide catheter achieving a TICI 3 reperfusion.  Mr Brain Wo Contrast 06/27/2017 Completed acute infarction affecting the majority of the inferior division left MCA distribution. This affects the left temporal lobe and posterior frontal lobe with patchy involvement in the parietal region.  Infarction of the caudate and putamen as well.  Petechial blood product deposition without focal hematoma.  Swelling but no shift at this time.  Ct Cerebral Perfusion W  Contrast 06/26/2017 Left M1 emergent large vessel occlusion with associated acute left MCA territory infarct.  Core infarct is estimated at 49 mL.  The tissue at risk is estimated at 182 mL.   Ct Head Code Stroke W/o Cm 06/26/2017 1. Focal hyperdensity in left distal M1 may represent thrombus.  2. No vascular territory infarct identified at this time.  3. Small left frontal subdural hematoma is stable in size and attenuation from 2014.  4. ASPECTS is 10   06/29/2017 1. Evolving large LEFT MCA territory infarct (LEFT basal ganglia, LEFT temporoparietal lobes) with petechial hemorrhage, no hemorrhagic conversion.  2. Acute small volume LEFT cerebrum extra-axial blood products.  3. Chronic changes include old small LEFT frontal subdural hematoma and small LEFT frontal encephalomalacia.    Transthoracic echocardiogram 06/28/2017 Study Conclusions - Left ventricle: The cavity size was normal. There was mild   concentric hypertrophy. Systolic function was normal. The    estimated ejection fraction was in the range of 50% to 55%.   Hypokinesis of the apical inferior and akinesis of the basal to   mid inferior myocardium. Doppler parameters are consistent with   indeterminate ventricular filling pressure. - Aortic valve: Valve mobility was restricted. There was mild   stenosis. There was mild regurgitation. Peak velocity (S): 300   cm/s. Mean gradient (S): 19 mm Hg. Valve area (VTI): 1.64 cm^2.   Valve area (Vmax): 1.49 cm^2. Valve area (Vmean): 1.39 cm^2.   Regurgitation pressure half-time: 723 ms. - Mitral valve: Transvalvular velocity was within the normal range.   There was no evidence for stenosis. There was no regurgitation. - Left atrium: The atrium was severely dilated. - Right ventricle: The cavity size was normal. Wall thickness was   normal. Systolic function was normal. - Tricuspid valve: There was mild regurgitation. - Pulmonary arteries: Systolic pressure was within the  normal   range. PA peak pressure: 27 mm Hg (S)  Ct Head Wo Contrast 07/04/2017 IMPRESSION: 1. Unchanged extent of cytotoxic edema within the posterior left MCA territory and left basal ganglia with mass effect on the left lateral ventricle but no midline shift or hydrocephalus. 2. Decreased number of foci petechial hemorrhage. No hemorrhagic conversion/parenchymal hematoma.   Dg Chest Port 1 View 07/04/2017 IMPRESSION: 1. Retrocardiac opacity presumably on the basis of a left effusion and compressive atelectasis. A pneumonia is not entirely excluded. 2. Mild unchanged vascular congestion is again seen with cardiomegaly and post CABG change.   LE venous doppler - There is no obvious evidence of deep or superficial vein thrombosis involving the right and left lower extremities. All clearly visualized vessels appear patent and compressible. There is no evidence of Baker's cysts bilaterally.  Dg Chest Port 1 View 07/05/2017 IMPRESSION: No change in left basilar changes.    PHYSICAL EXAM  Temp:  [97.7 F (36.5 C)-99.9 F (37.7 C)]  99.9 F (37.7 C) (07/20 1154) Pulse Rate:  [67-103] 101 (07/20 1000) Resp:  [23-30] 24 (07/20 1000) BP: (110-140)/(67-96) 125/70 (07/20 1000) SpO2:  [91 %-100 %] 99 % (07/20 1000) Weight:  [193 lb 5.5 oz (87.7 kg)] 193 lb 5.5 oz (87.7 kg) (07/20 0500)  General - Well nourished, well developed, awake alert.  Ophthalmologic - Fundi not visualized due to noncooperation.  Cardiovascular - irregularly irregular heart rate and rhythm with RVR.  Extremities - right groin wound dress dry and clean.   Neuro - lethargic, but awake alert, off BiPAP, smiled to provider and his son, eyes open, not following commands except "close eyes", eye able to attend both sides, blinking to visual threat on the left but not inconsistent on the right. PERRL. Right facial droop, tongue midline in mouth. Spontaneous movement of LUE and LLE, at least 3/5 LUE and 2+/5 LLE. RUE and RLE  mild withdraw on pain stimulation. DTR 1+ and right positive babinski. Sensation, coordination and gait not tested.    ASSESSMENT/PLAN Mr. RONNELL CLINGER is a 83 y.o. male with history of prostate cancer, CAD, history of GI bleed, DM2, essential tremor, hypertension, history of atrial fibrillation and subdural hematoma, s/p craniectomy and clot evacuation who presented with right facial droop, dense right hemiplegia global aphasia, and left gaze deviation. He recevied tPA at 1507 on 06/26/2017.  CTA Occluded left M1 -> IR thrombectomy TICI 3 reperfusion with Dr. Estanislado Pandy.  Stroke: left MCA infarct due to left M1 occlusion s/p thrombectomy due to afib not on AC  Resultant  aphasia and right hemiplegia  CT head:  Focal hyperdensity in left distal M1 may represent thrombus. Small left frontal subdural hematoma is stable in size and attenuation from 2014.   MRI head: left MCA infarct. Petechial blood product deposition without focal hematoma.  CT Angio Head: Emergent large vessel occlusion of the left M1 segment. Dense calcifications at the carotid bifurcations bilaterally without significant stenoses  2D Echo - EF 50-55%. Left atrium severely dilated. No cardiac source of emboli identified.  LDL 78  HgbA1c 6.8  SCDs for VTE prophylaxis Diet NPO time specified  No antithrombotic prior to admission, now on ASA 81mg . Pt deemed not candidate for anticoagulation per cardiology.  Patient counseled to be compliant with his antithrombotic medications  Ongoing aggressive stroke risk factor management  Therapy recommendations:  CIR  Disposition:   Transferred to stepdown  Right femoral pseudoaneurysm with massive thigh hematoma s/p IR due to PFA tear   VVS on board, appreciate recs  requiring surgical suturing and clot evacuation  Dress dry and clean  JP drain removed today  Afib not on AC  Not on AC at baseline due to previous SDH s/p crani and evacuation  Pt deemed not  anticoagulation candidate per cardiology  On metoprolol for RVR  Rate currently 90-110  Cardiology on board and recs appreciated  Respiratory distress  CCM on board  Off BiPAP  LE venous doppler no DVT  PE unlikely at this time  Chest PT, NT suctioning and nebulization per CCM   AKI  Cre 0.90->1.99->2.93  Etiology not clear, could be due to dehydration, sepsis  On IVF and free water  BMP monitoring  If continue to worse, consider nephrology consult  Hypernatremia  Na 146->150->148  Continue 1/2NS and free water  BMP monitoring  Leukocytosis   WBC 11.1->9.3->7.4->13.1 -> 11.9->13.2->9.9->14.9  Suspicious for aspiration  On Fortaz  CXR stable  Repeat CXR in am  Hyperlipidemia  Home meds: pravastatin 40mg  PO daily  LDL 78, goal < 70  Statin resumed  Continue statin at discharge  Diabetes  HgbA1c 6.8, goal < 7.0  Controlled  Hyperglycemia improved  Resume home medications  SSI  Anemia, improved  Due to acute blood loss  Hb 9.0->8.2->7.8->7.8->8.7->8.7->9.3->9.0  Close monitoring  PRBC transfusion if needed  Other Stroke Risk Factors  Advanced age  Overweight, Body mass index is 28.55 kg/m., recommend weight loss, diet and exercise as appropriate   Family hx stroke (Mother)  CAD  Other Active Problems  Thrombocytopenia, resolved  hypokalemia, resolved  GI bleed history   Hospital day # 9  This patient is critically ill due to large left MCA infarct s/p IR, right groin hematoma s/p evacuation, afib RVR, anemia, respiratory failure and at significant risk of neurological worsening, death form recurrent stroke, hemorrhagic transformation, shock, heart failure, aspiration pneumonia. This patient's care requires constant monitoring of vital signs, hemodynamics, respiratory and cardiac monitoring, review of multiple databases, neurological assessment, discussion with family, other specialists and medical decision making of  high complexity. I spent 35 minutes of neurocritical care time in the care of this patient. I had long discussion with daughter and son at bedside, updated pt current condition, treatment plan and potential prognosis. They expressed understanding and appreciation.   Rosalin Hawking, MD PhD Stroke Neurology 07/05/2017 2:14 PM   To contact Stroke Continuity provider, please refer to [email or web address] After hours, contact General Neurology

## 2017-07-05 NOTE — Progress Notes (Signed)
PHARMACY NOTE:  ANTIMICROBIAL RENAL DOSAGE ADJUSTMENT  Current antimicrobial regimen includes a mismatch between antimicrobial dosage and estimated renal function.  As per policy approved by the Pharmacy & Therapeutics and Medical Executive Committees, the antimicrobial dosage will be adjusted accordingly.  Current antimicrobial dosage:  Ceftazidime 1g IFV q8h  Indication: PNA  Renal Function:  Estimated Creatinine Clearance: 21.3 mL/min (A) (by C-G formula based on SCr of 2.93 mg/dL (H)). []      On intermittent HD, scheduled: []      On CRRT    Antimicrobial dosage has been changed to:  Ceftazidime 1g IV q24h  Additional comments: LOT not yet addressed- will continue to follow   7/15 Vanc>>>7/17 7/15 Ceftaz >>>  7/11 MRSA PCR Negative 7/14: BCx: CONS in 1/4 7/15 Urine: ngF  7/15 Respiratory: nml flora    Thank you for allowing pharmacy to be a part of this patient's care.  Verna Czech, [facility] 07/05/2017 1:51 PM

## 2017-07-05 NOTE — Progress Notes (Signed)
PULMONARY / CRITICAL CARE MEDICINE   Name: PETERSON Keith MRN: [id number] DOB: July 20, 1933    ADMISSION DATE:  06/26/2017 CONSULTATION DATE:  06/26/17  REFERRING MD:  Dr. Leonie Man  CHIEF COMPLAINT:  CVA  BRIEF SUMMARY:   83 year old man  presented 7/11 from home with witnessed onset of right facial droop, right hemiplegia and aphasia .  CT head showed a hyperdense left middle cerebral artery and chronic small left frontal subdural (unchanged from prior in 2014).  TPA was given   CTA showed left M1 occlusion and therefore patient taken to neuro IR for mechanical thrombectomy and revascularization.  Initially moved out of ICU then developed respiratory distress and was transferred back to ICU 7/19 > improved overnight.   PMH of afib (not on NOAC 2/2 GI bleed), HTN, prior SDH in 2014 that required evacuation with residual small chronic SDH), DM, CAD, tremor, H. Pylori infection, and prostate cancer         SUBJECTIVE:  No further acute events overnight > O2 weaned to 3L.  Pt has been refusing to cough on command but has moist cough intermittently.     VITAL SIGNS: BP 118/72 (BP Location: Left Arm)   Pulse (!) 102   Temp 97.7 F (36.5 C) (Axillary)   Resp (!) 30   Ht 5\' 9"  (1.753 m)   Wt 193 lb 5.5 oz (87.7 kg)   SpO2 100%   BMI 28.55 kg/m   HEMODYNAMICS:    VENTILATOR SETTINGS:    INTAKE / OUTPUT: I/O last 3 completed shifts: In: 2621.3 [I.V.:1521.3; Other:60; NG/GT:840; IV [id number]:878] Out: 965 [Urine:820; Drains:145]  PHYSICAL EXAMINATION: General: frail elderly male in NAD  HEENT: MM pink/moist, no jvd,  Neuro: opens eyes to voice, looks at provider when speaking, spontaneous movement of L, R hemiplegia CV: s1s2 rrr, no m/r/g PULM: even/non-labored, lungs bilaterally coarse rhonchi, moist/wet cough  MV:[facility], non-tender, bsx4 active  Extremities: warm/dry, no edema  Skin: no rashes or lesions    LABS:  BMET  Recent Labs Lab 07/03/17 0205 07/04/17 0538  07/05/17 0235  NA 146* 150* 148*  K 3.3* 4.3 4.0  CL 110 116* 114*  CO2 26 24 24   BUN 26* 54* 73*  CREATININE 0.90 1.99* 2.93*  GLUCOSE 192* 193* 165*    Electrolytes  Recent Labs Lab 06/29/17 0400  07/02/17 0406 07/03/17 0205 07/04/17 0538 07/05/17 0235  CALCIUM 7.4*  < > 8.0* 7.9* 8.0* 8.0*  MG 1.7  --  2.2  --   --  2.8*  PHOS 2.1*  --   --   --   --  5.1*  < > = values in this interval not displayed.  CBC  Recent Labs Lab 07/03/17 0205 07/04/17 0613 07/05/17 0235  WBC 9.9 14.9* 13.9*  HGB 8.7* 9.3* 9.0*  HCT 26.9* 29.8* 29.4*  PLT 255 266 252    Coag's No results for input(s): APTT, INR in the last 168 hours.  Sepsis Markers  Recent Labs Lab 06/29/17 1734 06/29/17 2045 06/30/17 0005 06/30/17 0445 07/01/17 0240  LATICACIDVEN 2.4* 2.8*  --  3.1*  --   PROCALCITON  --   --  5.57 6.30 5.47    ABG  Recent Labs Lab 06/29/17 1701 06/30/17 0010 07/04/17 0255  PHART 7.423 7.475* 7.487*  PCO2ART 25.2* 29.9* 33.2  PO2ART 54.0* 65.2* 66.0*    Liver Enzymes No results for input(s): AST, ALT, ALKPHOS, BILITOT, ALBUMIN in the last 168 hours.  Cardiac Enzymes  Recent  Labs Lab 07/04/17 0538  TROPONINI 0.11*    Glucose  Recent Labs Lab 07/04/17 1157 07/04/17 1600 07/04/17 2022 07/04/17 2343 07/05/17 0343 07/05/17 0758  GLUCAP 178* 141* 175* 174* 154* 164*   STUDIES:  7/11 Head CT >> focal hyperdensity of left distal M1 may represent thrombus, no vascular territory infarct indentified, small left frontal SDH stable in size and attenuation from 2014, ASPECTS is 10 7/11 CTA head and neck>> emergent large vessel occlusion of left M1 segment, some reconstitution of anterior left MCA branch vessels from pial collaterals 7/12 CT abd >> hematoma in the anterior RIGHT thigh measuring 5.6 x 5.3 cm 7/12 MRI >> evolved lt MCA infarct 7/19 CT head > unchanged extent of cytotoxic edema within the posterior L MCA territory and left basal ganglia with  mass effect on the L lateral ventricle but no midline shift or hydrocephalus, decreased foci petechial hemorrhage, no hemorrhagic conversion  CULTURES: Respiratory 7/15 >> normal flora Urine 7/15 >> negative BCID 7/15 >> staph species detected  Blood 7/15 >> 1 of 2 coag-negative staph   ANTIBIOTICS: 7/11 Ancef (pre-op) ceftaz (empiric) 7/14 >>  vanco (empiric) 7/14 >> 7/17  SIGNIFICANT EVENTS: 7/11  Admit  7/11  Right femoral sheath found to be kinked, TPA reversal deemed to risky. Sheath pulled in PACU.  7/12  Emergent OR for left groin hematoma , profunda femoral repaired 7/18  Transferred out of ICU 7/19  Increased WOB and change in mental status, back to ICU > PCCM asked to see again  LINES/TUBES: 7/11 ETT >> 7/16 7/11 OGT>> 7/16 7/11 Left Radial Aline >> 7/15 7/11 Right Femoral Sheath > 7/11  7/11 R groin JP drain >>>    DISCUSSION: 89 yoM with PMH of Afib- not on coag  With Left MCA-CVA s/p neuro IR. Right femoral pseudoaneurysm and thigh hematoma following sheath removal. Complicated by hemorrhagic shock, resolved. Extubated 7/16 and tolerating well.  Transferred out of ICU 7/18 but later developed mental status change along with increased WOB so neuro transferring back to ICU and PCCM called back.  ASSESSMENT / PLAN:   Tachypnea with increased WOB - neuro concerned about PE but doubtful as pt not hypoxic at all and clinical presentation with change in mental status and complete unresponsiveness does not fit.  Wells score 3 (based on tachycardia and recent immobility); low probability.  Favor intracranial process.  If small chance this was PE, pt would not be a candidate for anticoagulation given large stroke and chronic SDH. Post op resp failure, VDRF (resolved).  Now DNI status. B infiltrate 7/14, favor Edema v TRALI (less likely PNA) - improved 7/16 w diuresis. DNI Status. P:   PRN BiPAP as needed  Ok to go to [employer]  Will need aggressive pulmonary hygiene- IS,  mobilize, chest PT  DNI / DNR SLP following High risk aspiration > aspiration precautions  NTS PRN  If remains off BiPAP, consider restart of TF in am 7/21  Intermittent CXR > monitor LLL  Acute encephalopathy - with change in mental status 7/19 (per daughter, pt had been minimally responsive from the night prior).  Strong suspicion for acute intracranial process. Left M1 occlusion s/p TPA and mechanical thrombectomy w/revascularization  - with right hemiplegia, global aphasia and left gaze   Chronic small left frontal SDH from prior SDH in 2014 that required craniotomy.  P:  Recommendations per Neurology   FAMILY  - Updates: Family updated at bedside am 7/20 on NP rounds  - Inter-disciplinary family  meet or Palliative Care meeting due by:  Completed.  DNR/DNI per prior discussion   - Global:  Ok to transition to SDU > feel he needs aggressive pulmonary hygiene to avoid decompensation   Noe Gens, NP-C [facility] Pgr: [phone] or if no answer [phone] 07/05/2017, 10:44 AM

## 2017-07-05 NOTE — Plan of Care (Signed)
Problem: Education: Goal: Knowledge of [facility] Education information/materials will improve Outcome: Progressing Education was given to family  Problem: Skin Integrity: Goal: Risk for impaired skin integrity will decrease Outcome: Progressing Frequent turns, foam dressing placed per protocol   Problem: Fluid Volume: Goal: Ability to maintain a balanced intake and output will improve Outcome: Progressing Tube feedings   Problem: Self-Care: Goal: Ability to communicate needs accurately will improve Outcome: Not Progressing Pt is aphasic

## 2017-07-05 NOTE — Consult Note (Signed)
  [facility] TEAM 1 - Stepdown/ICU TEAM  Chart reviewed.  TR[facility] was scheduled to assume consultative care of this pt from PCCM today.  Review of chart notes that the active respiratory issues noted yesterday have now resolved, w/ the pt returning to baseline respiratory status. All other active medical issues are being expertly managed by the primary and other consultative services.    [facility] will sign off.  We are happy to assist in any way should our services be desired.  Please don't hesitate to contact me.  Cherene Altes, MD [facility] Office  [phone] Pager - Text Page per Amion as per below:  On-Call/Text Page:      [email or web address]      password [email or web address]  If 7PM-7AM, please contact night-coverage [email or web address] Password [email or web address] 07/05/2017, 9:09 AM

## 2017-07-05 NOTE — Progress Notes (Signed)
Physical Therapy Treatment Patient Details Name: Robert Keith MRN: [id number] DOB: 1933-06-16 Today's Date: 07/05/2017    History of Present Illness Pt is an 83 y.o. male who presented to the ED with global aphasia, R hemiplegia, and L gaze deviation. Pt with L M1 occlusion s/p tPA and mechanical thrombectomy with revascularization. Pt has a history of prostate cancer, CAD, history of GI bleed, DM2, essential tremor, hypertension, history of atrial fibrillation and subdural hematoma s/p craniectomy and clot evaluation. He was intubated 7/11-7/16. Pt had significant R thigh hematoma with R femoral pseudoaneurysm following sheath removal and underwent lt femoral artery repair on 7/12. Pt transferred back to ICU on 07/04/17.     PT Comments    Expect progress to continue to be slow due to severe deficits.   Follow Up Recommendations  CIR;Supervision/Assistance - 24 hour     Equipment Recommendations  Wheelchair (measurements PT);Wheelchair cushion (measurements PT);Other (comment) (possible mechanical lift)    Recommendations for Other Services Rehab consult     Precautions / Restrictions Precautions Precautions: Fall Precaution Comments: lt groin incision Restrictions Weight Bearing Restrictions: No    Mobility  Bed Mobility Overal bed mobility: Needs Assistance Bed Mobility: Rolling;Supine to Sit;Sit to Sidelying Rolling: +2 for physical assistance;Total assist   Supine to sit: +2 for physical assistance;Total assist   Sit to sidelying: Total assist;+2 for physical assistance General bed mobility comments: Assist for all aspects  Transfers Overall transfer level: Needs assistance               General transfer comment: [place] for bed to chair  Ambulation/Gait                 Stairs            Wheelchair Mobility    Modified Rankin (Stroke Patients Only)       Balance Overall balance assessment: Needs assistance Sitting-balance support:  Bilateral upper extremity supported;Feet supported;No upper extremity supported Sitting balance-Leahy Scale: Poor Sitting balance - Comments: Pt sat EOB x 20 minutes with mod to max assist with very brief periods on min A. Work on sitting posture. Worked on putting hands on my knees to encourage anterior wt shift. Also worked to propping on elbows to left and right side.  Postural control: Posterior lean                                  Cognition Arousal/Alertness: Awake/alert Behavior During Therapy: Flat affect Overall Cognitive Status: Difficult to assess Area of Impairment: Following commands                       Following Commands: Follows one step commands inconsistently       General Comments: Difficult to assess due to aphasia. Pt following some commands with verbal and visual cues.       Exercises      General Comments General comments (skin integrity, edema, etc.): VSS      Pertinent Vitals/Pain Pain Assessment: Faces Faces Pain Scale: No hurt    Home Living                      Prior Function            PT Goals (current goals can now be found in the care plan section) Acute Rehab PT Goals Patient Stated Goal: unable to state Progress towards PT goals:  Not progressing toward goals - comment    Frequency    Min 3X/week      PT Plan Current plan remains appropriate    Co-evaluation              AM-PAC PT "6 Clicks" Daily Activity  Outcome Measure  Difficulty turning over in bed (including adjusting bedclothes, sheets and blankets)?: Total Difficulty moving from lying on back to sitting on the side of the bed? : Total Difficulty sitting down on and standing up from a chair with arms (e.g., wheelchair, bedside commode, etc,.)?: Total Help needed moving to and from a bed to chair (including a wheelchair)?: Total Help needed walking in hospital room?: Total Help needed climbing 3-5 steps with a railing? :  Total 6 Click Score: 6    End of Session Equipment Utilized During Treatment: Oxygen Activity Tolerance: Patient tolerated treatment well Patient left: in chair;with call bell/phone within reach;with chair alarm set;with family/visitor present Nurse Communication: Mobility status;Need for lift equipment PT Visit Diagnosis: Muscle weakness (generalized) (M62.81);Hemiplegia and hemiparesis Hemiplegia - Right/Left: Right Hemiplegia - dominant/non-dominant: Dominant Hemiplegia - caused by: Cerebral infarction     Time: 1400-1433 PT Time Calculation (min) (ACUTE ONLY): 33 min  Charges:  $Therapeutic Activity: 23-37 mins                    G Codes:       [facility] PT [place] 07/05/2017, 5:21 PM

## 2017-07-05 NOTE — Progress Notes (Signed)
  Speech Language Pathology Treatment: Dysphagia  Patient Details Name: Robert Keith MRN: [id number] DOB: 1933/05/30 Today's Date: 07/05/2017 Time: 0102-7253 SLP Time Calculation (min) (ACUTE ONLY): 31 min  Assessment / Plan / Recommendation Clinical Impression  SLP followed up for PO trials to determine PO readiness with family at bedside. Robert Keith seen upright in chair, alert however with minimal expression despite cueing secondary to suspected globally aphasia deficits. Extensive oral care completed, Robert Keith continues to exhibit poor management of secretions with moderate dried secretions near velum and soft palate. Ice chips administered with decreased bolus manipulation. Swallow initiation felt via palpation that was suspected to be greatly delayed. Delayed cough exhibited intermittently throughout trials with suspected poor airway protection. 1/4 teaspoon puree attempted and placed on anterior portion of tongue, however Robert Keith with poor oral propulsion and SLP extracted from oral cavity. Educated family regarding pts swallow status and significant aspiration risk. Continue NPO with medicines via alternative means. ST to continue to follow for PO readiness.      HPI HPI: Robert Keith is an 83 y.o.malewith history of prostate cancer, CAD, history of GI bleed, DM2, essential tremor, hypertension, history of atrial fibrillation and subdural hematoma s/p craniectomy and clot evacuation,who presented with right facial droop, dense right hemiplegia global aphasia, and left gaze deviation. Robert Keith recevied tPA and went to IR for thrombectomy. Robert Keith was intubated 7/11-7/16. Robert Keith did have a prior MBS in September 2016 with swallowing WFL.       SLP Plan          Recommendations  Diet recommendations: NPO Medication Administration: Via alternative means                Oral Care Recommendations: Oral care QID Follow up Recommendations: Inpatient Rehab SLP Visit Diagnosis: Dysphagia, unspecified (R13.10);Aphasia  (R47.01)       GO               Arvil Chaco MA, CCC-SLP Acute Care Speech Language Pathologist    Levi Aland 07/05/2017, 3:24 PM

## 2017-07-06 ENCOUNTER — Inpatient Hospital Stay (HOSPITAL_COMMUNITY): Payer: Medicare Other

## 2017-07-06 LAB — CBC
HCT: 27.1 % — ABNORMAL LOW (ref 39.0–52.0)
HEMOGLOBIN: 8.4 g/dL — AB (ref 13.0–17.0)
MCH: 29.4 pg (ref 26.0–34.0)
MCHC: 31 g/dL (ref 30.0–36.0)
MCV: 94.8 fL (ref 78.0–100.0)
Platelets: 236 10*3/uL (ref 150–400)
RBC: 2.86 MIL/uL — AB (ref 4.22–5.81)
RDW: 17.5 % — ABNORMAL HIGH (ref 11.5–15.5)
WBC: 11.4 10*3/uL — ABNORMAL HIGH (ref 4.0–10.5)

## 2017-07-06 LAB — BASIC METABOLIC PANEL
Anion gap: 12 (ref 5–15)
BUN: 96 mg/dL — AB (ref 6–20)
CHLORIDE: 113 mmol/L — AB (ref 101–111)
CO2: 22 mmol/L (ref 22–32)
Calcium: 7.5 mg/dL — ABNORMAL LOW (ref 8.9–10.3)
Creatinine, Ser: 3.59 mg/dL — ABNORMAL HIGH (ref 0.61–1.24)
GFR calc Af Amer: 17 mL/min — ABNORMAL LOW (ref >60)
GFR calc non Af Amer: 14 mL/min — ABNORMAL LOW (ref >60)
GLUCOSE: 257 mg/dL — AB (ref 65–99)
POTASSIUM: 4.1 mmol/L (ref 3.5–5.1)
Sodium: 147 mmol/L — ABNORMAL HIGH (ref 135–145)

## 2017-07-06 LAB — GLUCOSE, CAPILLARY
GLUCOSE-CAPILLARY: 226 mg/dL — AB (ref 65–99)
Glucose-Capillary: 218 mg/dL — ABNORMAL HIGH (ref 65–99)
Glucose-Capillary: 237 mg/dL — ABNORMAL HIGH (ref 65–99)
Glucose-Capillary: 210 mg/dL — ABNORMAL HIGH (ref 65–99)
Glucose-Capillary: 198 mg/dL — ABNORMAL HIGH (ref 65–99)
Glucose-Capillary: 218 mg/dL — ABNORMAL HIGH (ref 65–99)

## 2017-07-06 LAB — CK: Total CK: 44 U/L — ABNORMAL LOW (ref 49–397)

## 2017-07-06 MED ORDER — CEFTAZIDIME 1 G IJ SOLR
1.0000 g | INTRAMUSCULAR | Status: DC
Start: 2017-07-07 — End: 2017-07-08
  Administered 2017-07-07 – 2017-07-08 (×2): 1 g via INTRAVENOUS
  Filled 2017-07-06 (×2): qty 1

## 2017-07-06 MED ORDER — FREE WATER
300.0000 mL | Status: DC
  Administered 2017-07-06 – 2017-07-13 (×39): 300 mL

## 2017-07-06 NOTE — Progress Notes (Signed)
PHARMACY NOTE:  ANTIMICROBIAL RENAL DOSAGE ADJUSTMENT  Current antimicrobial regimen includes a mismatch between antimicrobial dosage and estimated renal function.  As per policy approved by the Pharmacy & Therapeutics and Medical Executive Committees, the antimicrobial dosage will be adjusted accordingly.  Current antimicrobial dosage:  Ceftazidime 1g IV q 8 hrs  Indication: HCAP/Sepsis  Renal Function:  Estimated Creatinine Clearance: 17.6 mL/min (A) (by C-G formula based on SCr of 3.59 mg/dL (H)). []      On intermittent HD, scheduled: []      On CRRT    Antimicrobial dosage has been changed to:  Ceftazidime 1g IV q 24 hrs  Additional comments:   Thank you for allowing pharmacy to be a part of this patient's care.  Pat Patrick [facility] 07/06/2017 3:47 PM

## 2017-07-06 NOTE — Progress Notes (Signed)
   Daily Progress Note   Assessment/Planning:   POD #9 s/p R PFA repair, evac of thigh hematoma   Staples out this coming week  D/C Xeroform bandage (incorrectly used anyway)  Dry dressing to R thigh daily and as needed   Subjective  - 9 Days Post-Op   Minimally responsive   Objective   Vitals:   07/05/17 2340 07/06/17 0300 07/06/17 0343 07/06/17 0425  BP: 111/65  121/64   Pulse: 81 84 94   Resp: (!) 24 (!) 26 (!) 25   Temp: 98.6 F (37 C)  97.7 F (36.5 C)   TempSrc: Axillary  Oral   SpO2: 100% 97% 94%   Weight:    198 lb 10.2 oz (90.1 kg)  Height:         Intake/Output Summary (Last 24 hours) at 07/06/17 0721 Last data filed at 07/06/17 0400  Gross per 24 hour  Intake             2515 ml  Output              830 ml  Net             1685 ml    VASC Few appears of skin slough scabbed, echymosis greatly improved, swelling improve, groin inc c/d/i, staples in place    Laboratory   CBC CBC Latest Ref Rng & Units 07/06/2017 07/05/2017 07/04/2017  WBC 4.0 - 10.5 K/uL 11.4(H) 13.9(H) 14.9(H)  Hemoglobin 13.0 - 17.0 g/dL 8.4(L) 9.0(L) 9.3(L)  Hematocrit 39.0 - 52.0 % 27.1(L) 29.4(L) 29.8(L)  Platelets 150 - 400 K/uL 236 252 266    BMET    Component Value Date/Time   NA 147 (H) 07/06/2017 0226   NA 142 12/22/2014 1430   K 4.1 07/06/2017 0226   K 4.3 12/22/2014 1430   CL 113 (H) 07/06/2017 0226   CL 108 (H) 12/22/2014 1430   CO2 22 07/06/2017 0226   CO2 27 12/22/2014 1430   GLUCOSE 257 (H) 07/06/2017 0226   GLUCOSE 119 (H) 12/22/2014 1430   BUN 96 (H) 07/06/2017 0226   BUN 20 (H) 12/22/2014 1430   CREATININE 3.59 (H) 07/06/2017 0226   CREATININE 1.13 12/22/2014 1430   CALCIUM 7.5 (L) 07/06/2017 0226   CALCIUM 8.8 12/22/2014 1430   GFRNONAA 14 (L) 07/06/2017 0226   GFRNONAA >60 12/22/2014 1430   GFRAA 17 (L) 07/06/2017 0226   GFRAA >60 12/22/2014 1430     Adele Barthel, MD, FACS [facility] Office:  [phone] Pager: [phone]  07/06/2017, 7:21 AM

## 2017-07-06 NOTE — Consult Note (Signed)
MANINDER DEBOER Admit Date: 06/26/2017 07/06/2017 Rexene Agent Requesting Physician:  Leonie Man  Reason for Consult:  AKI HPI:  83 year old male seen at the request of Dr. Leonie Man for the evaluation of acute kidney injury. Patient seen with 3 children present at bedside.  PMH Incudes:  CAD history of CABG,  History of prostate cancer status post seed radiation therapy  History of subdural hematoma requiring craniotomy  History of atrial fibrillation not on anticoagulation because of history of subdural hematoma  Type 2 diabetes  Patient was admitted 06/26/17 with acute onset of right-sided hemiplegia and droop and found to have a large left MCA ischemic CVA. He received TPA followed by attempted endovascular thrombectomy, successful, with IR. This was complicated by a large right thigh hematoma with pseudoaneurysm requiring clot evacuation and surgical correction on 7/12.  He has had a slow but progressive course with some recovery of neurological function. He is currently receiving ceftaz edema. He has also received vancomycin.  He has baseline normal GFR and was at this baseline through 7/18. At that time a Foley catheter was removed and he transition to a condom catheter. His creatinine has progressively increased to 3.6 today. He has normal potassium and bicarbonate. He has had mild hypernatremia for which she is receiving hypotonic IV fluids as well as free water flushes with his enteral nutrition. His weight is 10 pounds higher than at presentation. Urinalysis on 7/14 had small hemoglobin without pyuria, hematuria.  No renal ultrasound at this time. Urine output has been between 0.5 and 1 L even after discontinuation of the Foley catheter. Today, nursing performed bedside bladder scan with 222 mL recorded.  Urine sediment evaluation performed by me with abundant squamous cells, scattered cellular debris. A single hyaline granular cast. No organized elements otherwise, no muddy brown  cast.   Creatinine (mg/dL)  Date Value  12/22/2014 1.13   Creatinine, Ser (mg/dL)  Date Value  07/06/2017 3.59 (H)  07/05/2017 2.93 (H)  07/04/2017 1.99 (H)  07/03/2017 0.90  07/02/2017 0.81  07/01/2017 0.91  06/30/2017 1.04  06/29/2017 0.90  06/29/2017 0.97  06/28/2017 1.24  ] I/Os:  ROS NSAIDS: none IV Contrast no recent exposures TMP/SMX no exposures Hypotension no exposures Balance of 12 systems is negative w/ exceptions as above  PMH  Past Medical History:  Diagnosis Date  . Anemia   . Cancer [facility])    prostate  . Chronic atrial fibrillation ([place])   . Coronary artery disease    a. s/p CABG [facility] 1996.  . Diabetes mellitus without complication ([place])   . Essential tremor   . GI bleed 09/2016  . H. pylori infection   . H/O craniotomy   . Hypertension   . Intracranial hemorrhage ([place])   . Prostate cancer ([place])   . RBBB   . SDH (subdural hematoma) ([facility])    PSH  Past Surgical History:  Procedure Laterality Date  . brain surgery for bleed    . cardiac bypass    . CORONARY ARTERY BYPASS GRAFT    . ESOPHAGOGASTRODUODENOSCOPY (EGD) WITH PROPOFOL N/A 09/18/2016   Procedure: ESOPHAGOGASTRODUODENOSCOPY (EGD) WITH PROPOFOL;  Surgeon: [name], MD;  Location: [place] [facility];  Service: Endoscopy;  Laterality: N/A;  . FEMORAL ARTERY EXPLORATION Right 06/27/2017   Procedure: FEMORAL ARTERY EXPLORATION;  Surgeon: Conrad Dugger, MD;  Location: [place];  Service: Vascular;  Laterality: Right;  . IR PERCUTANEOUS ART THROMBECTOMY/INFUSION INTRACRANIAL INC DIAG ANGIO  06/26/2017  . RADIOLOGY WITH ANESTHESIA N/A 06/26/2017  Procedure: RADIOLOGY WITH ANESTHESIA CODE STROKE;  Surgeon: [name], MD;  Location: [place];  Service: Radiology;  Laterality: N/A;   FH  Family History  Problem Relation Age of Onset  . Stroke Mother   . Heart attack Father    [place]  reports that he has quit smoking. He has never used smokeless tobacco. He reports that he does not drink  alcohol or use drugs. Allergies  Allergies  Allergen Reactions  . Metformin And Related Diarrhea  . Tape Other (See Comments)    PLEASE USE COBAN WRAP; PATIENT'S SKIN IS THIN AND WILL TEAR EASILY!!  . Bupropion Other (See Comments)    Unknown  . Fenofibrate Micronized Other (See Comments)    Unknown  . Gemfibrozil Other (See Comments)    Unknown  . Niacin Other (See Comments)    Impotence   . Phenobarbital Other (See Comments)    Confusion   . Simvastatin Other (See Comments)    Myalgia   Home medications Prior to Admission medications   Medication Sig Start Date End Date Taking? Authorizing Provider  amLODipine (NORVASC) 5 MG tablet Take 2.5 mg by mouth 2 (two) times daily.  04/04/17  Yes [provider]  clotrimazole (LOTRIMIN) 1 % cream Apply 1 application topically 2 (two) times daily.  06/20/17  Yes [provider]  furosemide (LASIX) 20 MG tablet Take 1 tablet (20 mg total) by mouth daily as needed. Patient taking differently: Take 20 mg by mouth daily as needed for fluid or edema.  11/07/16 06/27/17 Yes Gollan, Kathlene November, MD  glipiZIDE (GLUCOTROL) 10 MG tablet Take 10 mg by mouth 2 (two) times daily. 05/24/17  Yes [provider]  KLOR-CON M20 20 MEQ tablet TAKE 1 TABLET (20 MEQ TOTAL) BY MOUTH DAILY AS NEEDED. Patient taking differently: Take 20 mEq by mouth daily as needed (for potassium).  06/11/17  Yes Minna Merritts, MD  loratadine (CLARITIN) 10 MG tablet Take 10 mg by mouth daily.   Yes [provider]  MEGARED OMEGA-3 KRILL OIL PO Take 1 capsule by mouth daily.   Yes [provider]  metoprolol (LOPRESSOR) 100 MG tablet Take 100 mg by mouth 2 (two) times daily.  07/16/16  Yes [provider]  pantoprazole (PROTONIX) 40 MG tablet Take 1 tablet (40 mg total) by mouth 2 (two) times daily before a meal. Patient taking differently: Take 40 mg by mouth daily.  09/21/16  Yes Ghimire, Henreitta Leber, MD  pioglitazone (ACTOS) 15 MG  tablet Take 15 mg by mouth daily. 06/21/17  Yes [provider]  pravastatin (PRAVACHOL) 40 MG tablet Take 40 mg by mouth at bedtime.  11/23/15  Yes [provider]  propranolol (INDERAL) 40 MG tablet Take 40 mg by mouth 2 (two) times daily. 06/13/17  Yes [provider]    Current Medications Scheduled Meds: . albuterol  2.5 mg Nebulization BID  . amLODipine  5 mg Per Tube Daily  . aspirin  81 mg Per Tube Daily  . chlorhexidine  15 mL Mouth Rinse BID  . feeding supplement (OSMOLITE 1.2 CAL)  1,000 mL Per Tube Q24H  . feeding supplement (PRO-STAT SUGAR FREE 64)  30 mL Per Tube BID  . free water  200 mL Per Tube Q6H  . glipiZIDE  10 mg Per Tube BID WC  . insulin aspart  0-15 Units Subcutaneous Q4H  . mouth rinse  15 mL Mouth Rinse q12n4p  . metoprolol tartrate  100 mg Per  Tube BID  . pantoprazole sodium  40 mg Per Tube Daily  . pioglitazone  15 mg Per Tube Q breakfast  . pravastatin  40 mg Per Tube QHS   Continuous Infusions: . sodium chloride 50 mL/hr at 07/06/17 0512  . sodium chloride    . cefTAZidime (FORTAZ)  IV Stopped (07/06/17 0929)   PRN Meds:.sodium chloride, acetaminophen **OR** acetaminophen (TYLENOL) oral liquid 160 mg/5 mL **OR** acetaminophen, fentaNYL (SUBLIMAZE) injection, metoprolol tartrate, ondansetron (ZOFRAN) IV, senna-docusate  CBC  Recent Labs Lab 07/04/17 0613 07/05/17 0235 07/06/17 0226  WBC 14.9* 13.9* 11.4*  HGB 9.3* 9.0* 8.4*  HCT 29.8* 29.4* 27.1*  MCV 93.1 93.9 94.8  PLT 266 252 831   Basic Metabolic Panel  Recent Labs Lab 06/30/17 0445 07/01/17 0240 07/02/17 0406 07/03/17 0205 07/04/17 0538 07/05/17 0235 07/06/17 0226  NA 141 145 145 146* 150* 148* 147*  K 3.3* 3.2* 3.5 3.3* 4.3 4.0 4.1  CL 111 112* 112* 110 116* 114* 113*  CO2 22 27 24 26 24 24 22   GLUCOSE 218* 173* 166* 192* 193* 165* 257*  BUN 17 20 22* 26* 54* 73* 96*  CREATININE 1.04 0.91 0.81 0.90 1.99* 2.93* 3.59*  CALCIUM 7.3* 7.6* 8.0* 7.9* 8.0*  8.0* 7.5*  PHOS  --   --   --   --   --  5.1*  --     Physical Exam  Blood pressure 125/65, pulse 92, temperature 98.2 F (36.8 C), temperature source Oral, resp. rate (!) 22, height 5\' 9"  (1.753 m), weight 90.1 kg (198 lb 10.2 oz), SpO2 99 %. GEN: Awake alert, not much interaction ENT: NCAT EYES: EOMI CV: IRIR, nl s1s2 PULM: coarse bs b/l, ant auscultaiton ABD: s/nd/nd, no SP fullness SKIN: no rashes/lesions EXT: trace to 1+ LEE GU: Condom catheter present  Assessment 72M with AKI, nonoliguria in setting of ischemic CVA and prolonged hospital course  1. AKI, nonoliguric, normal baseline GFR, Urine sediment contaminated but no evidence of organized elements or muddy brown casts; no recent clear nephrotoxins. ? Obstruction aftre foley removal.   2. Hypernatremia, mild, on hypotonic IVFs + FWF per NGT 3. S/p L MCA ischemic CVA with tpA and endovascular thrombectomy 4. R thigh hematoma and femoral pseudoaneurysm s/p surgical evacuation and repair 06/27/17 5. AFib not on AC 2/2 hx/o SDH 6. CAD, hx/o CABG  Plan 1. Need to definitively exclude obstruction, renal US 2. UA 3. Check CK 4. He would be poor candidate for HD both short and long term, not indicated now, and hopefully won't come to that; will cont to dicuss with family as needed 5. Increase free water flushes to 300 q4h 6. Daily weights, Daily Renal Panel, Strict I/Os, Avoid nephrotoxins (NSAIDs, judicious IV Contrast) 7. Will follow closely   Pearson Grippe MD [phone] pgr 07/06/2017, 11:49 AM

## 2017-07-06 NOTE — Progress Notes (Signed)
STROKE TEAM PROGRESS NOTE   SUBJECTIVE (INTERVAL HISTORY) His daughters and son are at the bedside. Pt awake alert, no respiratory distress.   Pt has no acute event overnight.  CXR no change.   his Cre continue to climb up, today Cre 3.59, Na 147. Will continue IVF and add free water. Currently on 1/2NS for hypernatremia. Will consult nephrology today  OBJECTIVE Temp:  [97.7 F (36.5 C)-99.5 F (37.5 C)] 98.8 F (37.1 C) (07/21 1158) Pulse Rate:  [71-102] 71 (07/21 1158) Cardiac Rhythm: Atrial fibrillation (07/21 1158) Resp:  [22-28] 25 (07/21 1158) BP: (106-125)/(63-80) 112/63 (07/21 1158) SpO2:  [92 %-100 %] 100 % (07/21 1158) FiO2 (%):  [32 %] 32 % (07/21 0742) Weight:  [197 lb 12 oz (89.7 kg)-198 lb 10.2 oz (90.1 kg)] 198 lb 10.2 oz (90.1 kg) (07/21 0425)  CBC:   Recent Labs Lab 07/05/17 0235 07/06/17 0226  WBC 13.9* 11.4*  HGB 9.0* 8.4*  HCT 29.4* 27.1*  MCV 93.9 94.8  PLT 252 703    Basic Metabolic Panel:  Recent Labs Lab 07/02/17 0406  07/05/17 0235 07/06/17 0226  NA 145  < > 148* 147*  K 3.5  < > 4.0 4.1  CL 112*  < > 114* 113*  CO2 24  < > 24 22  GLUCOSE 166*  < > 165* 257*  BUN 22*  < > 73* 96*  CREATININE 0.81  < > 2.93* 3.59*  CALCIUM 8.0*  < > 8.0* 7.5*  MG 2.2  --  2.8*  --   PHOS  --   --  5.1*  --   < > = values in this interval not displayed.  Lipid Panel:     Component Value Date/Time   CHOL 115 06/27/2017 0500   TRIG 86 06/27/2017 0500   HDL 20 (L) 06/27/2017 0500   CHOLHDL 5.8 06/27/2017 0500   VLDL 17 06/27/2017 0500   LDLCALC 78 06/27/2017 0500   HgbA1c:  Lab Results  Component Value Date   HGBA1C 6.8 (H) 06/27/2017   Urine Drug Screen: No results found for: LABOPIA, COCAINSCRNUR, LABBENZ, AMPHETMU, THCU, LABBARB  Alcohol Level No results found for: [place] I have personally reviewed the radiological images below and agree with the radiology interpretations.  Ct Abdomen Pelvis Wo Contrast 06/27/2017 1. Hematoma within  the proximal RIGHT anterior thigh is incompletely imaged. Hematoma likely spontaneous related to anticoagulation. Recommend clinical correlation with extent of hematoma in the RIGHT thigh.  2. No intraperitoneal or retroperitoneal hemorrhage.  3. Extensive diverticulosis without diverticulitis.  4.  Aortic Atherosclerosis (ICD10-I70.0).  5. Small bilateral pleural effusions.    Ct Angio Head and neck W and Wo Contrast 06/26/2017 1. Emergent large vessel occlusion of the left M1 segment.  2. There is some reconstitution of anterior left MCA branch vessels from pial collaterals.  3. Dense calcifications at the carotid bifurcations bilaterally without significant stenoses relative to the more distal vessels.  4. Moderate narrowing of the proximal posterior cerebral arteries bilaterally, right greater than left.  5. Calcifications at great vessel origins along the arch without significant stenosis. 6. Hypoplastic left vertebral artery.  7. Multilevel cervical spinal spondylosis.  8. Emphysema.  9. Prior granulomatous disease in the right upper lobe.  Cerebral aneurysm and thrombectomy - Dr. Estanislado Pandy 06/26/2017 Status post endovascular complete revascularization of occluded left middle cerebral artery with 1 pass with Solitaire 4 mm x 40 mm FR retrieval device and a 6 [place] intermediary guide catheter achieving  a TICI 3 reperfusion.  Mr Brain Wo Contrast 06/27/2017 Completed acute infarction affecting the majority of the inferior division left MCA distribution. This affects the left temporal lobe and posterior frontal lobe with patchy involvement in the parietal region.  Infarction of the caudate and putamen as well.  Petechial blood product deposition without focal hematoma.  Swelling but no shift at this time.  Ct Cerebral Perfusion W Contrast 06/26/2017 Left M1 emergent large vessel occlusion with associated acute left MCA territory infarct.  Core infarct is estimated at 49 mL.  The  tissue at risk is estimated at 182 mL.   Ct Head Code Stroke W/o Cm 06/26/2017 1. Focal hyperdensity in left distal M1 may represent thrombus.  2. No vascular territory infarct identified at this time.  3. Small left frontal subdural hematoma is stable in size and attenuation from 2014.  4. ASPECTS is 10   06/29/2017 1. Evolving large LEFT MCA territory infarct (LEFT basal ganglia, LEFT temporoparietal lobes) with petechial hemorrhage, no hemorrhagic conversion.  2. Acute small volume LEFT cerebrum extra-axial blood products.  3. Chronic changes include old small LEFT frontal subdural hematoma and small LEFT frontal encephalomalacia.    Transthoracic echocardiogram 06/28/2017 Study Conclusions - Left ventricle: The cavity size was normal. There was mild   concentric hypertrophy. Systolic function was normal. The    estimated ejection fraction was in the range of 50% to 55%.   Hypokinesis of the apical inferior and akinesis of the basal to   mid inferior myocardium. Doppler parameters are consistent with   indeterminate ventricular filling pressure. - Aortic valve: Valve mobility was restricted. There was mild   stenosis. There was mild regurgitation. Peak velocity (S): 300   cm/s. Mean gradient (S): 19 mm Hg. Valve area (VTI): 1.64 cm^2.   Valve area (Vmax): 1.49 cm^2. Valve area (Vmean): 1.39 cm^2.   Regurgitation pressure half-time: 723 ms. - Mitral valve: Transvalvular velocity was within the normal range.   There was no evidence for stenosis. There was no regurgitation. - Left atrium: The atrium was severely dilated. - Right ventricle: The cavity size was normal. Wall thickness was   normal. Systolic function was normal. - Tricuspid valve: There was mild regurgitation. - Pulmonary arteries: Systolic pressure was within the normal   range. PA peak pressure: 27 mm Hg (S)  Ct Head Wo Contrast 07/04/2017 IMPRESSION: 1. Unchanged extent of cytotoxic edema within the posterior left  MCA territory and left basal ganglia with mass effect on the left lateral ventricle but no midline shift or hydrocephalus. 2. Decreased number of foci petechial hemorrhage. No hemorrhagic conversion/parenchymal hematoma.   Dg Chest Port 1 View 07/04/2017 IMPRESSION: 1. Retrocardiac opacity presumably on the basis of a left effusion and compressive atelectasis. A pneumonia is not entirely excluded. 2. Mild unchanged vascular congestion is again seen with cardiomegaly and post CABG change.   LE venous doppler - There is no obvious evidence of deep or superficial vein thrombosis involving the right and left lower extremities. All clearly visualized vessels appear patent and compressible. There is no evidence of Baker's cysts bilaterally.  Dg Chest Port 1 View 07/05/2017 IMPRESSION: No change in left basilar changes.    PHYSICAL EXAM  Temp:  [97.7 F (36.5 C)-99.5 F (37.5 C)] 98.8 F (37.1 C) (07/21 1158) Pulse Rate:  [71-102] 71 (07/21 1158) Resp:  [22-28] 25 (07/21 1158) BP: (106-125)/(63-80) 112/63 (07/21 1158) SpO2:  [92 %-100 %] 100 % (07/21 1158) FiO2 (%):  [32 %] 32 % (  07/21 0742) Weight:  [197 lb 12 oz (89.7 kg)-198 lb 10.2 oz (90.1 kg)] 198 lb 10.2 oz (90.1 kg) (07/21 0425)  General - Well nourished, well developed, awake alert.  Ophthalmologic - Fundi not visualized due to noncooperation.  Cardiovascular - irregularly irregular heart rate and rhythm with RVR.  Extremities - right groin wound dress dry and clean.   Neuro - lethargic, but awake alert,on nasal canula ,   eyes open, not following commands except "close eyes", eye able to attend both sides, blinking to visual threat on the left but not inconsistent on the right. PERRL. Right facial droop, tongue midline in mouth. Spontaneous movement of LUE and LLE, at least 3/5 LUE and 2+/5 LLE. RUE and RLE mild withdraw on pain stimulation. DTR 1+ and right positive babinski. Sensation, coordination and gait not  tested.    ASSESSMENT/PLAN [name] is a 83 y.o. male with history of prostate cancer, CAD, history of GI bleed, DM2, essential tremor, hypertension, history of atrial fibrillation and subdural hematoma, s/p craniectomy and clot evacuation who presented with right facial droop, dense right hemiplegia global aphasia, and left gaze deviation. He recevied tPA at 1507 on 06/26/2017.  CTA Occluded left M1 -> IR thrombectomy TICI 3 reperfusion with Dr. Estanislado Pandy.  Stroke: left MCA infarct due to left M1 occlusion s/p thrombectomy due to afib not on AC  Resultant  aphasia and right hemiplegia  CT head:  Focal hyperdensity in left distal M1 may represent thrombus. Small left frontal subdural hematoma is stable in size and attenuation from 2014.   MRI head: left MCA infarct. Petechial blood product deposition without focal hematoma.  CT Angio Head: Emergent large vessel occlusion of the left M1 segment. Dense calcifications at the carotid bifurcations bilaterally without significant stenoses  2D Echo - EF 50-55%. Left atrium severely dilated. No cardiac source of emboli identified.  LDL 78  HgbA1c 6.8  SCDs for VTE prophylaxis Diet NPO time specified  No antithrombotic prior to admission, now on ASA 81mg . Pt deemed not candidate for anticoagulation per cardiology.  Patient counseled to be compliant with his antithrombotic medications  Ongoing aggressive stroke risk factor management  Therapy recommendations:  CIR  Disposition:   Transferred to stepdown  Right femoral pseudoaneurysm with massive thigh hematoma s/p IR due to PFA tear   VVS on board, appreciate recs  requiring surgical suturing and clot evacuation  Dress dry and clean  JP drain removed today  Afib not on AC  Not on AC at baseline due to previous SDH s/p crani and evacuation  Pt deemed not anticoagulation candidate per cardiology  On metoprolol for RVR  Rate currently 90-110  Cardiology on board and  recs appreciated  Respiratory distress  CCM on board  Off BiPAP  LE venous doppler no DVT  PE unlikely at this time  Chest PT, NT suctioning and nebulization per CCM   AKI  Cre 0.90->1.99->2.93  Etiology not clear, could be due to dehydration, sepsis  On IVF and free water  BMP monitoring  If continue to worse, consider nephrology consult  Hypernatremia  Na 146->150->148  Continue 1/2NS and free water  BMP monitoring  Leukocytosis   WBC 11.1->9.3->7.4->13.1 -> 11.9->13.2->9.9->14.9  Suspicious for aspiration  On Fortaz  CXR stable  Repeat CXR in am  Hyperlipidemia  Home meds: pravastatin 40mg  PO daily  LDL 78, goal < 70  Statin resumed  Continue statin at discharge  Diabetes  HgbA1c 6.8, goal < 7.0  Controlled  Hyperglycemia improved  Resume home medications  SSI  Anemia, improved  Due to acute blood loss  Hb 9.0->8.2->7.8->7.8->8.7->8.7->9.3->9.0  Close monitoring  PRBC transfusion if needed  Other Stroke Risk Factors  Advanced age  Overweight, Body mass index is 29.33 kg/m., recommend weight loss, diet and exercise as appropriate   Family hx stroke (Mother)  CAD  Other Active Problems  Thrombocytopenia, resolved  hypokalemia, resolved  GI bleed history  Acute renal failure-new onset   Hospital day # 10 Plan renal consult for worsening creatinine -suspect contrast nephropathy. Speech therapy plan modified barium swallow later today. D/w with son and daughter at bedside and answered questions. D/w Dr Joelyn Oms nephrologist This patient is critically ill due to large left MCA infarct s/p IR, right groin hematoma s/p evacuation, afib RVR, anemia, respiratory failure and at significant risk of neurological worsening, death form recurrent stroke, hemorrhagic transformation, shock, heart failure, aspiration pneumonia. This patient's care requires constant monitoring of vital signs, hemodynamics, respiratory and cardiac  monitoring, review of multiple databases, neurological assessment, discussion with family, other specialists and medical decision making of high complexity. I spent 32 minutes of neurocritical care time in the care of this patient. I had long discussion with daughter and son at bedside, updated pt current condition, treatment plan and potential prognosis. They expressed understanding and appreciation.   Antony Contras, MD   Stroke Neurology 07/06/2017 1:28 PM   To contact Stroke Continuity provider, please refer to [email or web address] After hours, contact General Neurology

## 2017-07-06 NOTE — Progress Notes (Signed)
  Speech Language Pathology Treatment: Dysphagia  Patient Details Name: Robert Keith MRN: [id number] DOB: 1933-01-21 Today's Date: 07/06/2017 Time: 9532-0233 SLP Time Calculation (min) (ACUTE ONLY): 26 min  Assessment / Plan / Recommendation Clinical Impression  Pt continues to exhibit a moderate to severe oral dysphagia with suspected pharyngeal involvement of neurogenic etiology. Intermittent delayed cough persisted following ice chips. Pt alert throughout PO trials but with variable swallow sequencing requiring frequent cues for manipulation of bolus throughout oral cavity. SLP did palpate for swallow initiation that was felt with fair laryngeal elevation following ice chips and puree consistencies. Recommend MBS to further assess swallow function and determine readiness for diet initiation vs needs for long term nutritional support. Family at bedside and in agreement with plan. MBS to be attempted as radiology scheduling permits.     HPI HPI: Pt is an 83 y.o.malewith history of prostate cancer, CAD, history of GI bleed, DM2, essential tremor, hypertension, history of atrial fibrillation and subdural hematoma s/p craniectomy and clot evacuation,who presented with right facial droop, dense right hemiplegia global aphasia, and left gaze deviation. He recevied tPA and went to IR for thrombectomy. He was intubated 7/11-7/16. Pt did have a prior MBS in September 2016 with swallowing WFL.       SLP Plan  MBS       Recommendations  Diet recommendations: NPO Medication Administration: Via alternative means                Oral Care Recommendations: Oral care QID Follow up Recommendations: Inpatient Rehab SLP Visit Diagnosis: Dysphagia, unspecified (R13.10);Aphasia (R47.01) Plan: MBS       GO               Robert Chaco MA, CCC-SLP Acute Care Speech Language Pathologist    Robert Keith 07/06/2017, 11:00 AM

## 2017-07-07 LAB — GLUCOSE, CAPILLARY
GLUCOSE-CAPILLARY: 182 mg/dL — AB (ref 65–99)
GLUCOSE-CAPILLARY: 163 mg/dL — AB (ref 65–99)
GLUCOSE-CAPILLARY: 196 mg/dL — AB (ref 65–99)
GLUCOSE-CAPILLARY: 197 mg/dL — AB (ref 65–99)
GLUCOSE-CAPILLARY: 216 mg/dL — AB (ref 65–99)
Glucose-Capillary: 189 mg/dL — ABNORMAL HIGH (ref 65–99)
Glucose-Capillary: 189 mg/dL — ABNORMAL HIGH (ref 65–99)

## 2017-07-07 LAB — URINALYSIS, ROUTINE W REFLEX MICROSCOPIC
BILIRUBIN URINE: NEGATIVE
GLUCOSE, UA: NEGATIVE mg/dL
HGB URINE DIPSTICK: NEGATIVE
Ketones, ur: NEGATIVE mg/dL
Leukocytes, UA: NEGATIVE
Nitrite: NEGATIVE
PH: 5 (ref 5.0–8.0)
Protein, ur: NEGATIVE mg/dL
SPECIFIC GRAVITY, URINE: 1.011 (ref 1.005–1.030)

## 2017-07-07 LAB — BASIC METABOLIC PANEL
ANION GAP: 10 (ref 5–15)
Anion gap: 13 (ref 5–15)
BUN: 116 mg/dL — AB (ref 6–20)
BUN: 95 mg/dL — ABNORMAL HIGH (ref 6–20)
CHLORIDE: 110 mmol/L (ref 101–111)
CHLORIDE: 114 mmol/L — AB (ref 101–111)
CO2: 22 mmol/L (ref 22–32)
CO2: 24 mmol/L (ref 22–32)
CREATININE: 4.5 mg/dL — AB (ref 0.61–1.24)
Calcium: 7.3 mg/dL — ABNORMAL LOW (ref 8.9–10.3)
Calcium: 7.6 mg/dL — ABNORMAL LOW (ref 8.9–10.3)
Creatinine, Ser: 3.21 mg/dL — ABNORMAL HIGH (ref 0.61–1.24)
GFR calc Af Amer: 13 mL/min — ABNORMAL LOW (ref >60)
GFR calc Af Amer: 19 mL/min — ABNORMAL LOW (ref >60)
GFR calc non Af Amer: 11 mL/min — ABNORMAL LOW (ref >60)
GFR, EST NON AFRICAN AMERICAN: 17 mL/min — AB (ref >60)
GLUCOSE: 223 mg/dL — AB (ref 65–99)
Glucose, Bld: 179 mg/dL — ABNORMAL HIGH (ref 65–99)
POTASSIUM: 4.4 mmol/L (ref 3.5–5.1)
POTASSIUM: 4.4 mmol/L (ref 3.5–5.1)
SODIUM: 145 mmol/L (ref 135–145)
Sodium: 148 mmol/L — ABNORMAL HIGH (ref 135–145)

## 2017-07-07 LAB — CBC
HEMATOCRIT: 26.6 % — AB (ref 39.0–52.0)
Hemoglobin: 8.1 g/dL — ABNORMAL LOW (ref 13.0–17.0)
MCH: 28.6 pg (ref 26.0–34.0)
MCHC: 30.5 g/dL (ref 30.0–36.0)
MCV: 94 fL (ref 78.0–100.0)
PLATELETS: 242 10*3/uL (ref 150–400)
RBC: 2.83 MIL/uL — ABNORMAL LOW (ref 4.22–5.81)
RDW: 17.7 % — AB (ref 11.5–15.5)
WBC: 11.1 10*3/uL — AB (ref 4.0–10.5)

## 2017-07-07 NOTE — Anesthesia Postprocedure Evaluation (Signed)
Anesthesia Post Note  Patient: Robert Keith  Procedure(s) Performed: Procedure(s) (LRB): FEMORAL ARTERY EXPLORATION (Right)     Patient location during evaluation: SICU Anesthesia Type: General Level of consciousness: sedated and patient remains intubated per anesthesia plan Pain management: pain level controlled Vital Signs Assessment: post-procedure vital signs reviewed and stable Respiratory status: patient remains intubated per anesthesia plan Cardiovascular status: stable Anesthetic complications: no    Last Vitals:  Vitals:   07/07/17 0416 07/07/17 0819  BP: 120/67   Pulse: 92   Resp: (!) 31   Temp: 36.9 C 36.9 C    Last Pain:  Vitals:   07/07/17 0819  TempSrc: Axillary  PainSc:                  Nels Munn,JAMES TERRILL

## 2017-07-07 NOTE — Progress Notes (Signed)
SLP Cancellation Note  Patient Details Name: Robert Keith MRN: [id number] DOB: 14-Mar-1933   Cancelled treatment:       Reason Eval/Treat Not Completed: Fatigue/lethargy limiting ability to participate- pt not arousable for MBS.  Will f/u next date for readiness.   Juan Quam Laurice 07/07/2017, 10:18 AM

## 2017-07-07 NOTE — Progress Notes (Signed)
Admit: 06/26/2017 LOS: 35  72M with AKI, nonoliguria in setting of ischemic CVA and prolonged hospital course  Subjective:  Renal US w/ some b/l hydro, anuric past 24h More somnolent caregiver says this AM\ Na improving with more free water CK wnl    07/21 0701 - 07/22 0700 In: 2260 [I.V.:1000; NG/GT:1260] Out: 100 [Urine:100]  Filed Weights   07/05/17 2100 07/06/17 0425 07/07/17 0416  Weight: 89.7 kg (197 lb 12 oz) 90.1 kg (198 lb 10.2 oz) 92.6 kg (204 lb 2.3 oz)    Scheduled Meds: . albuterol  2.5 mg Nebulization BID  . amLODipine  5 mg Per Tube Daily  . aspirin  81 mg Per Tube Daily  . chlorhexidine  15 mL Mouth Rinse BID  . feeding supplement (OSMOLITE 1.2 CAL)  1,000 mL Per Tube Q24H  . feeding supplement (PRO-STAT SUGAR FREE 64)  30 mL Per Tube BID  . free water  300 mL Per Tube Q4H  . glipiZIDE  10 mg Per Tube BID WC  . insulin aspart  0-15 Units Subcutaneous Q4H  . mouth rinse  15 mL Mouth Rinse q12n4p  . metoprolol tartrate  100 mg Per Tube BID  . pantoprazole sodium  40 mg Per Tube Daily  . pioglitazone  15 mg Per Tube Q breakfast  . pravastatin  40 mg Per Tube QHS   Continuous Infusions: . sodium chloride 50 mL/hr at 07/07/17 0000  . sodium chloride    . cefTAZidime (FORTAZ)  IV     PRN Meds:.sodium chloride, acetaminophen **OR** acetaminophen (TYLENOL) oral liquid 160 mg/5 mL **OR** acetaminophen, fentaNYL (SUBLIMAZE) injection, metoprolol tartrate, ondansetron (ZOFRAN) IV, senna-docusate  Current Labs: reviewed  7/22: Renal US: FINDINGS: Right Kidney:  Length: 10.7 cm. Cortical echogenicity within normal limits. Mild hydronephrosis. Mild enlargement of proximal right ureter.  Left Kidney: Length: 11.6 cm. Echogenicity within normal limits. Mild left hydronephrosis. Bladder: Appears normal for degree of bladder distention. IMPRESSION: Mild bilateral hydronephrosis.   Physical Exam:  Blood pressure 120/67, pulse 92, temperature 98.5 F (36.9 C),  temperature source Axillary, resp. rate (!) 31, height 5\' 9"  (1.753 m), weight 92.6 kg (204 lb 2.3 oz), SpO2 97 %. GEN: Sleeping ENT: NCAT EYES: EOMI CV: IRIR, nl s1s2 PULM: coarse bs b/l, ant auscultaiton ABD: s/nd/nd, no SP fullness SKIN: no rashes/lesions EXT: trace to 1+ LEE GU: Condom catheter present  A 1. AKI, now anuric, normal baseline GFR, Urine sediment contaminated but no evidence of organized elements or muddy brown casts; no recent clear nephrotoxins. Renal US w/ b/l HN, possible obstructive etiology as this began immediatly after foley removal. 2. Hypernatremia, mild, on hypotonic IVFs + FWF per NGT; improving 3. S/p L MCA ischemic CVA with tpA and endovascular thrombectomy 4. R thigh hematoma and femoral pseudoaneurysm s/p surgical evacuation and repair 06/27/17 5. AFib not on AC 2/2 hx/o SDH 6. CAD, hx/o CABG  P 7. Insert foley catheter 8. BMP this PM 9. He would be poor candidate for HD both short and long term, not indicated now, and hopefully won't come to that; will cont to dicuss with family as needed 10. Cont FWF 11. Daily weights, Daily Renal Panel, Strict I/Os, Avoid nephrotoxins (NSAIDs, judicious IV Contrast) 12. Will follow closely   Pearson Grippe MD 07/07/2017, 7:41 AM   Recent Labs Lab 07/05/17 0235 07/06/17 0226 07/07/17 0409  NA 148* 147* 145  K 4.0 4.1 4.4  CL 114* 113* 110  CO2 24 22 22   GLUCOSE 165*  257* 179*  BUN 73* 96* 116*  CREATININE 2.93* 3.59* 4.50*  CALCIUM 8.0* 7.5* 7.3*  PHOS 5.1*  --   --     Recent Labs Lab 07/05/17 0235 07/06/17 0226 07/07/17 0409  WBC 13.9* 11.4* 11.1*  HGB 9.0* 8.4* 8.1*  HCT 29.4* 27.1* 26.6*  MCV 93.9 94.8 94.0  PLT 252 236 242

## 2017-07-07 NOTE — Progress Notes (Signed)
STROKE TEAM PROGRESS NOTE   SUBJECTIVE (INTERVAL HISTORY) His family is not at the bedside. Pt drowsy but awakens easily, no respiratory distress.   Pt has no acute event overnight.  CXR no change.   his Cre continue to climb up, today Cre 4.59, Na 145. Will continue IVF and added more free water. Currently on 1/2NS for hypernatremia.   Nephrology consult appreciated. Renal ultrasound shows b/l hydronephrosis due to obstruction and was anuric and passing foley catheter has drained a lot of urine OBJECTIVE Temp:  [98.3 F (36.8 C)-98.7 F (37.1 C)] 98.3 F (36.8 C) (07/22 1206) Pulse Rate:  [75-92] 75 (07/22 1206) Cardiac Rhythm: Atrial fibrillation (07/22 0807) Resp:  [20-31] 31 (07/22 0416) BP: (103-127)/(56-80) 107/58 (07/22 1206) SpO2:  [97 %-100 %] 99 % (07/22 1206) Weight:  [204 lb 2.3 oz (92.6 kg)] 204 lb 2.3 oz (92.6 kg) (07/22 0416)  CBC:   Recent Labs Lab 07/06/17 0226 07/07/17 0409  WBC 11.4* 11.1*  HGB 8.4* 8.1*  HCT 27.1* 26.6*  MCV 94.8 94.0  PLT 236 616    Basic Metabolic Panel:  Recent Labs Lab 07/02/17 0406  07/05/17 0235 07/06/17 0226 07/07/17 0409  NA 145  < > 148* 147* 145  K 3.5  < > 4.0 4.1 4.4  CL 112*  < > 114* 113* 110  CO2 24  < > 24 22 22   GLUCOSE 166*  < > 165* 257* 179*  BUN 22*  < > 73* 96* 116*  CREATININE 0.81  < > 2.93* 3.59* 4.50*  CALCIUM 8.0*  < > 8.0* 7.5* 7.3*  MG 2.2  --  2.8*  --   --   PHOS  --   --  5.1*  --   --   < > = values in this interval not displayed.  Lipid Panel:     Component Value Date/Time   CHOL 115 06/27/2017 0500   TRIG 86 06/27/2017 0500   HDL 20 (L) 06/27/2017 0500   CHOLHDL 5.8 06/27/2017 0500   VLDL 17 06/27/2017 0500   LDLCALC 78 06/27/2017 0500   HgbA1c:  Lab Results  Component Value Date   HGBA1C 6.8 (H) 06/27/2017   Urine Drug Screen: No results found for: LABOPIA, COCAINSCRNUR, LABBENZ, AMPHETMU, THCU, LABBARB  Alcohol Level No results found for: [place] I have personally reviewed  the radiological images below and agree with the radiology interpretations.  Ct Abdomen Pelvis Wo Contrast 06/27/2017 1. Hematoma within the proximal RIGHT anterior thigh is incompletely imaged. Hematoma likely spontaneous related to anticoagulation. Recommend clinical correlation with extent of hematoma in the RIGHT thigh.  2. No intraperitoneal or retroperitoneal hemorrhage.  3. Extensive diverticulosis without diverticulitis.  4.  Aortic Atherosclerosis (ICD10-I70.0).  5. Small bilateral pleural effusions.    Ct Angio Head and neck W and Wo Contrast 06/26/2017 1. Emergent large vessel occlusion of the left M1 segment.  2. There is some reconstitution of anterior left MCA branch vessels from pial collaterals.  3. Dense calcifications at the carotid bifurcations bilaterally without significant stenoses relative to the more distal vessels.  4. Moderate narrowing of the proximal posterior cerebral arteries bilaterally, right greater than left.  5. Calcifications at great vessel origins along the arch without significant stenosis. 6. Hypoplastic left vertebral artery.  7. Multilevel cervical spinal spondylosis.  8. Emphysema.  9. Prior granulomatous disease in the right upper lobe.  Cerebral aneurysm and thrombectomy - Dr. Estanislado Pandy 06/26/2017 Status post endovascular complete revascularization of occluded  left middle cerebral artery with 1 pass with Solitaire 4 mm x 40 mm FR retrieval device and a 6 [place] intermediary guide catheter achieving a TICI 3 reperfusion.  Mr Brain Wo Contrast 06/27/2017 Completed acute infarction affecting the majority of the inferior division left MCA distribution. This affects the left temporal lobe and posterior frontal lobe with patchy involvement in the parietal region.  Infarction of the caudate and putamen as well.  Petechial blood product deposition without focal hematoma.  Swelling but no shift at this time.  Ct Cerebral Perfusion W  Contrast 06/26/2017 Left M1 emergent large vessel occlusion with associated acute left MCA territory infarct.  Core infarct is estimated at 49 mL.  The tissue at risk is estimated at 182 mL.   Ct Head Code Stroke W/o Cm 06/26/2017 1. Focal hyperdensity in left distal M1 may represent thrombus.  2. No vascular territory infarct identified at this time.  3. Small left frontal subdural hematoma is stable in size and attenuation from 2014.  4. ASPECTS is 10   06/29/2017 1. Evolving large LEFT MCA territory infarct (LEFT basal ganglia, LEFT temporoparietal lobes) with petechial hemorrhage, no hemorrhagic conversion.  2. Acute small volume LEFT cerebrum extra-axial blood products.  3. Chronic changes include old small LEFT frontal subdural hematoma and small LEFT frontal encephalomalacia.    Transthoracic echocardiogram 06/28/2017 Study Conclusions - Left ventricle: The cavity size was normal. There was mild   concentric hypertrophy. Systolic function was normal. The    estimated ejection fraction was in the range of 50% to 55%.   Hypokinesis of the apical inferior and akinesis of the basal to   mid inferior myocardium. Doppler parameters are consistent with   indeterminate ventricular filling pressure. - Aortic valve: Valve mobility was restricted. There was mild   stenosis. There was mild regurgitation. Peak velocity (S): 300   cm/s. Mean gradient (S): 19 mm Hg. Valve area (VTI): 1.64 cm^2.   Valve area (Vmax): 1.49 cm^2. Valve area (Vmean): 1.39 cm^2.   Regurgitation pressure half-time: 723 ms. - Mitral valve: Transvalvular velocity was within the normal range.   There was no evidence for stenosis. There was no regurgitation. - Left atrium: The atrium was severely dilated. - Right ventricle: The cavity size was normal. Wall thickness was   normal. Systolic function was normal. - Tricuspid valve: There was mild regurgitation. - Pulmonary arteries: Systolic pressure was within the  normal   range. PA peak pressure: 27 mm Hg (S)  Ct Head Wo Contrast 07/04/2017 IMPRESSION: 1. Unchanged extent of cytotoxic edema within the posterior left MCA territory and left basal ganglia with mass effect on the left lateral ventricle but no midline shift or hydrocephalus. 2. Decreased number of foci petechial hemorrhage. No hemorrhagic conversion/parenchymal hematoma.   Dg Chest Port 1 View 07/04/2017 IMPRESSION: 1. Retrocardiac opacity presumably on the basis of a left effusion and compressive atelectasis. A pneumonia is not entirely excluded. 2. Mild unchanged vascular congestion is again seen with cardiomegaly and post CABG change.   LE venous doppler - There is no obvious evidence of deep or superficial vein thrombosis involving the right and left lower extremities. All clearly visualized vessels appear patent and compressible. There is no evidence of Baker's cysts bilaterally.  Dg Chest Port 1 View 07/05/2017 IMPRESSION: No change in left basilar changes.    PHYSICAL EXAM  Temp:  [98.3 F (36.8 C)-98.7 F (37.1 C)] 98.3 F (36.8 C) (07/22 1206) Pulse Rate:  [75-92] 75 (07/22 1206) Resp:  [  20-31] 31 (07/22 0416) BP: (103-127)/(56-80) 107/58 (07/22 1206) SpO2:  [97 %-100 %] 99 % (07/22 1206) Weight:  [204 lb 2.3 oz (92.6 kg)] 204 lb 2.3 oz (92.6 kg) (07/22 0416)  General - Well nourished, well developed, awake alert.  Ophthalmologic - Fundi not visualized due to noncooperation.  Cardiovascular - irregularly irregular heart rate and rhythm with RVR.  Extremities - right groin wound dress dry and clean.   Neuro - lethargic, but awake alert,on nasal canula ,   eyes open, not following commands except "close eyes", eye able to attend both sides, blinking to visual threat on the left but not inconsistent on the right. PERRL. Right facial droop, tongue midline in mouth. Spontaneous movement of LUE and LLE, at least 3/5 LUE and 2+/5 LLE. RUE and RLE mild withdraw on pain  stimulation. DTR 1+ and right positive babinski. Sensation, coordination and gait not tested.    ASSESSMENT/PLAN Mr. FOREST REDWINE is a 83 y.o. male with history of prostate cancer, CAD, history of GI bleed, DM2, essential tremor, hypertension, history of atrial fibrillation and subdural hematoma, s/p craniectomy and clot evacuation who presented with right facial droop, dense right hemiplegia global aphasia, and left gaze deviation. He recevied tPA at 1507 on 06/26/2017.  CTA Occluded left M1 -> IR thrombectomy TICI 3 reperfusion with Dr. Estanislado Pandy.  Stroke: left MCA infarct due to left M1 occlusion s/p thrombectomy due to afib not on AC  Resultant  aphasia and right hemiplegia  CT head:  Focal hyperdensity in left distal M1 may represent thrombus. Small left frontal subdural hematoma is stable in size and attenuation from 2014.   MRI head: left MCA infarct. Petechial blood product deposition without focal hematoma.  CT Angio Head: Emergent large vessel occlusion of the left M1 segment. Dense calcifications at the carotid bifurcations bilaterally without significant stenoses  2D Echo - EF 50-55%. Left atrium severely dilated. No cardiac source of emboli identified.  LDL 78  HgbA1c 6.8  SCDs for VTE prophylaxis Diet NPO time specified  No antithrombotic prior to admission, now on ASA 81mg . Pt deemed not candidate for anticoagulation per cardiology.  Patient counseled to be compliant with his antithrombotic medications  Ongoing aggressive stroke risk factor management  Therapy recommendations:  CIR  Disposition:   Transferred to stepdown  Right femoral pseudoaneurysm with massive thigh hematoma s/p IR due to PFA tear   VVS on board, appreciate recs  requiring surgical suturing and clot evacuation  Dress dry and clean  JP drain removed today  Afib not on [facility]  Not on AC at baseline due to previous SDH s/p crani and evacuation  Pt deemed not anticoagulation candidate per  cardiology  On metoprolol for RVR  Rate currently 90-110  Cardiology on board and recs appreciated  Respiratory distress  CCM on board  Off BiPAP  LE venous doppler no DVT  PE unlikely at this time  Chest PT, NT suctioning and nebulization per CCM   AKI  Cre 0.90->1.99->2.93  Etiology not clear, could be due to dehydration, sepsis  On IVF and free water  BMP monitoring  Foley catheter inserted 07/07/17  Hypernatremia  Na 146->150->148  Continue 1/2NS and free water  BMP monitoring  Leukocytosis   WBC 11.1->9.3->7.4->13.1 -> 11.9->13.2->9.9->14.9  Suspicious for aspiration  On Fortaz  CXR stable  Repeat CXR in am  Hyperlipidemia  Home meds: pravastatin 40mg  PO daily  LDL 78, goal < 70  Statin resumed  Continue statin at discharge  Diabetes  HgbA1c 6.8, goal < 7.0  Controlled  Hyperglycemia improved  Resume home medications  SSI  Anemia, improved  Due to acute blood loss  Hb 9.0->8.2->7.8->7.8->8.7->8.7->9.3->9.0  Close monitoring  PRBC transfusion if needed  Other Stroke Risk Factors  Advanced age  Overweight, Body mass index is 30.15 kg/m., recommend weight loss, diet and exercise as appropriate   Family hx stroke (Mother)  CAD  Other Active Problems  Thrombocytopenia, resolved  hypokalemia, resolved  GI bleed history  Acute renal failure-new onset likely obstructive due to bladder outlet obstruction   Hospital day # 11 Plan  Appreciate renal consult for worsening creatinine -hopefully folet drainage of obstruction will help renal function Speech therapy plan modified barium swallow   D/w with his sitter and RN at bedside and answered questions. D/w Dr Joelyn Oms nephrologist This patient is critically ill due to large left MCA infarct s/p IR, right groin hematoma s/p evacuation, afib RVR, anemia, respiratory failure and at significant risk of neurological worsening, death form recurrent stroke, hemorrhagic  transformation, shock, heart failure, aspiration pneumonia. This patient's care requires constant monitoring of vital signs, hemodynamics, respiratory and cardiac monitoring, review of multiple databases, neurological assessment, discussion with family, other specialists and medical decision making of high complexity. I spent 32 minutes of neurocritical care time in the care of this patient. I had long discussion with daughter and son at bedside, updated pt current condition, treatment plan and potential prognosis. They expressed understanding and appreciation.   Antony Contras, MD   Stroke Neurology 07/07/2017 12:57 PM   To contact Stroke Continuity provider, please refer to [email or web address] After hours, contact General Neurology

## 2017-07-08 LAB — GLUCOSE, CAPILLARY
GLUCOSE-CAPILLARY: 199 mg/dL — AB (ref 65–99)
GLUCOSE-CAPILLARY: 171 mg/dL — AB (ref 65–99)
GLUCOSE-CAPILLARY: 214 mg/dL — AB (ref 65–99)
Glucose-Capillary: 232 mg/dL — ABNORMAL HIGH (ref 65–99)
Glucose-Capillary: 274 mg/dL — ABNORMAL HIGH (ref 65–99)
Glucose-Capillary: 183 mg/dL — ABNORMAL HIGH (ref 65–99)

## 2017-07-08 LAB — BASIC METABOLIC PANEL
Anion gap: 8 (ref 5–15)
BUN: 48 mg/dL — ABNORMAL HIGH (ref 6–20)
CHLORIDE: 120 mmol/L — AB (ref 101–111)
CO2: 22 mmol/L (ref 22–32)
CREATININE: 1.32 mg/dL — AB (ref 0.61–1.24)
Calcium: 7.8 mg/dL — ABNORMAL LOW (ref 8.9–10.3)
GFR, EST AFRICAN AMERICAN: 56 mL/min — AB (ref >60)
GFR, EST NON AFRICAN AMERICAN: 49 mL/min — AB (ref >60)
Glucose, Bld: 219 mg/dL — ABNORMAL HIGH (ref 65–99)
POTASSIUM: 4.4 mmol/L (ref 3.5–5.1)
SODIUM: 150 mmol/L — AB (ref 135–145)

## 2017-07-08 NOTE — Progress Notes (Signed)
[facility] ROUNDING NOTE   Subjective:   Interval History: Pt drowsy but awakens easily, no respiratory distress.   Pt has no acute event overnight.  CXR no change.   his Creatinine is improving with relief of his obstruction     Objective:  Vital signs in last 24 hours:  Temp:  [98.4 F (36.9 C)-99.6 F (37.6 C)] 99.4 F (37.4 C) (07/23 1015) Pulse Rate:  [70-107] 107 (07/23 1015) Resp:  [19-25] 22 (07/23 1015) BP: (102-119)/(57-68) 115/59 (07/23 1015) SpO2:  [97 %-98 %] 98 % (07/23 1015) Weight:  [203 lb 14.8 oz (92.5 kg)] 203 lb 14.8 oz (92.5 kg) (07/23 0341)  Weight change: -3.5 oz (-0.1 kg) Filed Weights   07/06/17 0425 07/07/17 0416 07/08/17 0341  Weight: 198 lb 10.2 oz (90.1 kg) 204 lb 2.3 oz (92.6 kg) 203 lb 14.8 oz (92.5 kg)    Intake/Output: I/O last 3 completed shifts: In: 5592.7 [I.V.:2496.7; NG/GT:3046; IV Piggyback:50] Out: 8100 [Urine:8100]   Intake/Output this shift:  Total I/O In: 350 [NG/GT:300; IV Piggyback:50] Out: 1800 [Urine:1800]  CVS- RRR RS- CTA ABD- BS present soft non-distended EXT- no edema   Basic Metabolic Panel:  Recent Labs Lab 07/02/17 0406  07/04/17 0538 07/05/17 0235 07/06/17 0226 07/07/17 0409 07/07/17 1410  NA 145  < > 150* 148* 147* 145 148*  K 3.5  < > 4.3 4.0 4.1 4.4 4.4  CL 112*  < > 116* 114* 113* 110 114*  CO2 24  < > 24 24 22 22 24   GLUCOSE 166*  < > 193* 165* 257* 179* 223*  BUN 22*  < > 54* 73* 96* 116* 95*  CREATININE 0.81  < > 1.99* 2.93* 3.59* 4.50* 3.21*  CALCIUM 8.0*  < > 8.0* 8.0* 7.5* 7.3* 7.6*  MG 2.2  --   --  2.8*  --   --   --   PHOS  --   --   --  5.1*  --   --   --   < > = values in this interval not displayed.  Liver Function Tests: No results for input(s): AST, ALT, ALKPHOS, BILITOT, PROT, ALBUMIN in the last 168 hours. No results for input(s): LIPASE, AMYLASE in the last 168 hours. No results for input(s): AMMONIA in the last 168 hours.  CBC:  Recent Labs Lab  07/03/17 0205 07/04/17 4944 07/05/17 0235 07/06/17 0226 07/07/17 0409  WBC 9.9 14.9* 13.9* 11.4* 11.1*  HGB 8.7* 9.3* 9.0* 8.4* 8.1*  HCT 26.9* 29.8* 29.4* 27.1* 26.6*  MCV 90.3 93.1 93.9 94.8 94.0  PLT 255 266 252 236 242    Cardiac Enzymes:  Recent Labs Lab 07/04/17 0538 07/06/17 0226  CKTOTAL  --  44*  TROPONINI 0.11*  --     BNP: Invalid input(s): POCBNP  CBG:  Recent Labs Lab 07/07/17 1928 07/07/17 2316 07/08/17 0343 07/08/17 0825 07/08/17 1114  GLUCAP 189* 216* 199* 28* 38*    Microbiology: Results for orders placed or performed during the hospital encounter of 06/26/17  MRSA PCR Screening     Status: None   Collection Time: 06/26/17  8:11 PM  Result Value Ref Range Status   MRSA by PCR NEGATIVE NEGATIVE Final    Comment:        The GeneXpert MRSA Assay (FDA approved for NASAL specimens only), is one component of a comprehensive MRSA colonization surveillance program. It is not intended to diagnose MRSA infection nor to guide or monitor treatment for MRSA  infections.   Culture, blood (routine x 2)     Status: None   Collection Time: 06/29/17 11:55 PM  Result Value Ref Range Status   Specimen Description BLOOD LEFT HAND  Final   Special Requests IN PEDIATRIC BOTTLE Blood Culture adequate volume  Final   Culture NO GROWTH 5 DAYS  Final   Report Status 07/05/2017 FINAL  Final  Culture, blood (routine x 2)     Status: Abnormal   Collection Time: 06/30/17 12:08 AM  Result Value Ref Range Status   Specimen Description BLOOD RIGHT HAND  Final   Special Requests IN PEDIATRIC BOTTLE Blood Culture adequate volume  Final   Culture  Setup Time   Final    GRAM POSITIVE COCCI IN CLUSTERS IN PEDIATRIC BOTTLE CRITICAL RESULT CALLED TO, READ BACK BY AND VERIFIED WITHSanda Klein, PHARMD 2115 06/30/2017 T. TYSOR    Culture (A)  Final    STAPHYLOCOCCUS SPECIES (COAGULASE NEGATIVE) THE SIGNIFICANCE OF ISOLATING THIS ORGANISM FROM A SINGLE SET OF BLOOD  CULTURES WHEN MULTIPLE SETS ARE DRAWN IS UNCERTAIN. PLEASE NOTIFY THE MICROBIOLOGY DEPARTMENT WITHIN ONE WEEK IF SPECIATION AND SENSITIVITIES ARE REQUIRED.    Report Status 07/02/2017 FINAL  Final  Blood Culture ID Panel (Reflexed)     Status: Abnormal   Collection Time: 06/30/17 12:08 AM  Result Value Ref Range Status   Enterococcus species NOT DETECTED NOT DETECTED Final   Listeria monocytogenes NOT DETECTED NOT DETECTED Final   Staphylococcus species DETECTED (A) NOT DETECTED Final    Comment: Methicillin (oxacillin) susceptible coagulase negative staphylococcus. Possible blood culture contaminant (unless isolated from more than one blood culture draw or clinical case suggests pathogenicity). No antibiotic treatment is indicated for blood  culture contaminants. CRITICAL RESULT CALLED TO, READ BACK BY AND VERIFIED WITH: Sanda Klein, PHARMD 2115 06/30/2017 T. TYSOR    Staphylococcus aureus NOT DETECTED NOT DETECTED Final   Methicillin resistance NOT DETECTED NOT DETECTED Final   Streptococcus species NOT DETECTED NOT DETECTED Final   Streptococcus agalactiae NOT DETECTED NOT DETECTED Final   Streptococcus pneumoniae NOT DETECTED NOT DETECTED Final   Streptococcus pyogenes NOT DETECTED NOT DETECTED Final   Acinetobacter baumannii NOT DETECTED NOT DETECTED Final   Enterobacteriaceae species NOT DETECTED NOT DETECTED Final   Enterobacter cloacae complex NOT DETECTED NOT DETECTED Final   Escherichia coli NOT DETECTED NOT DETECTED Final   Klebsiella oxytoca NOT DETECTED NOT DETECTED Final   Klebsiella pneumoniae NOT DETECTED NOT DETECTED Final   Proteus species NOT DETECTED NOT DETECTED Final   Serratia marcescens NOT DETECTED NOT DETECTED Final   Haemophilus influenzae NOT DETECTED NOT DETECTED Final   Neisseria meningitidis NOT DETECTED NOT DETECTED Final   Pseudomonas aeruginosa NOT DETECTED NOT DETECTED Final   Candida albicans NOT DETECTED NOT DETECTED Final   Candida glabrata NOT  DETECTED NOT DETECTED Final   Candida krusei NOT DETECTED NOT DETECTED Final   Candida parapsilosis NOT DETECTED NOT DETECTED Final   Candida tropicalis NOT DETECTED NOT DETECTED Final  Culture, Urine     Status: None   Collection Time: 06/30/17  2:07 AM  Result Value Ref Range Status   Specimen Description URINE, CATHETERIZED  Final   Special Requests NONE  Final   Culture NO GROWTH  Final   Report Status 07/01/2017 FINAL  Final  Culture, respiratory (NON-Expectorated)     Status: None   Collection Time: 06/30/17  3:48 AM  Result Value Ref Range Status   Specimen Description TRACHEAL ASPIRATE  Final   Special Requests NONE  Final   Gram Stain   Final    ABUNDANT WBC PRESENT, PREDOMINANTLY PMN RARE SQUAMOUS EPITHELIAL CELLS PRESENT MODERATE GRAM NEGATIVE COCCI IN PAIRS FEW GRAM NEGATIVE RODS FEW GRAM POSITIVE COCCI IN PAIRS IN CLUSTERS RARE GRAM POSITIVE RODS    Culture Consistent with normal respiratory flora.  Final   Report Status 07/02/2017 FINAL  Final    Coagulation Studies: No results for input(s): LABPROT, INR in the last 72 hours.  Urinalysis:  Recent Labs  07/06/17 0421  COLORURINE YELLOW  LABSPEC 1.011  PHURINE 5.0  GLUCOSEU NEGATIVE  HGBUR NEGATIVE  BILIRUBINUR NEGATIVE  KETONESUR NEGATIVE  PROTEINUR NEGATIVE  NITRITE NEGATIVE  LEUKOCYTESUR NEGATIVE      Imaging: US Renal  Result Date: 07/07/2017 CLINICAL DATA:  Acute kidney injury EXAM: RENAL / URINARY TRACT ULTRASOUND COMPLETE COMPARISON:  CT knee 06/27/2017 FINDINGS: Right Kidney: Length: 10.7 cm. Cortical echogenicity within normal limits. Mild hydronephrosis. Mild enlargement of proximal right ureter. Left Kidney: Length: 11.6 cm. Echogenicity within normal limits. Mild left hydronephrosis. Bladder: Appears normal for degree of bladder distention. IMPRESSION: Mild bilateral hydronephrosis. Electronically Signed   By: Donavan Foil M.D.   On: 07/07/2017 01:21     Medications:   . sodium  chloride 50 mL/hr at 07/07/17 1959  . sodium chloride    . cefTAZidime (FORTAZ)  IV Stopped (07/08/17 1046)   . albuterol  2.5 mg Nebulization BID  . amLODipine  5 mg Per Tube Daily  . aspirin  81 mg Per Tube Daily  . chlorhexidine  15 mL Mouth Rinse BID  . feeding supplement (OSMOLITE 1.2 CAL)  1,000 mL Per Tube Q24H  . feeding supplement (PRO-STAT SUGAR FREE 64)  30 mL Per Tube BID  . free water  300 mL Per Tube Q4H  . glipiZIDE  10 mg Per Tube BID WC  . insulin aspart  0-15 Units Subcutaneous Q4H  . mouth rinse  15 mL Mouth Rinse q12n4p  . metoprolol tartrate  100 mg Per Tube BID  . pantoprazole sodium  40 mg Per Tube Daily  . pioglitazone  15 mg Per Tube Q breakfast  . pravastatin  40 mg Per Tube QHS   sodium chloride, acetaminophen **OR** acetaminophen (TYLENOL) oral liquid 160 mg/5 mL **OR** acetaminophen, fentaNYL (SUBLIMAZE) injection, metoprolol tartrate, ondansetron (ZOFRAN) IV, senna-docusate  Assessment/ Plan:   Acute kidney injury in setting of obstruction  From renal perspective will sign off   Emergent Large Vessel Occlusion M1 segment  Cerebral Aneurysm and thrombectomy   LOS: 12 Fayth Trefry W @TODAY @1 :21 PM

## 2017-07-08 NOTE — Progress Notes (Signed)
  Speech Language Pathology Treatment: Dysphagia  Patient Details Name: Robert Keith MRN: [id number] DOB: 07-17-1933 Today's Date: 07/08/2017 Time: 6010-9323 SLP Time Calculation (min) (ACUTE ONLY): 16 min  Assessment / Plan / Recommendation Clinical Impression  Pt more alert today.  No spontaneous verbalizations noted nor could they be elicited.  Pt nodding, smiling in response to examiner, but did not follow simple, contextual commands without total assist.  Required tactile cues to seal lips around spoon and to swallow.  Pt consumed ice chips, small sips of water with prolonged oral preparation and delayed cough response, suggesting continued difficulty protecting airway.  Recommend proceeding with MBS next date, provided pt maintains current MS.  D/W caregiver/POA.  Continues with persisting aphasia, dysphagia.   HPI HPI: Pt is an 83 y.o.malewith history of prostate cancer, CAD, history of GI bleed, DM2, essential tremor, hypertension, history of atrial fibrillation and subdural hematoma s/p craniectomy and clot evacuation,who presented with right facial droop, dense right hemiplegia global aphasia, and left gaze deviation. He recevied tPA and went to IR for thrombectomy. He was intubated 7/11-7/16. Pt did have a prior MBS in September 2016 with swallowing WFL.       SLP Plan  MBS       Recommendations  Diet recommendations: NPO Medication Administration: Via alternative means                Oral Care Recommendations: Oral care QID SLP Visit Diagnosis: Dysphagia, unspecified (R13.10);Aphasia (R47.01) Plan: MBS       GO               Thurmond Hildebran L. Tivis Ringer, [place] CCC/SLP Pager [phone]  Juan Quam Laurice 07/08/2017, 2:43 PM

## 2017-07-08 NOTE — Progress Notes (Signed)
Inpatient Diabetes Program Recommendations  AACE/ADA: New Consensus Statement on Inpatient Glycemic Control (2015)  Target Ranges:  Prepandial:   less than 140 mg/dL      Peak postprandial:   less than 180 mg/dL (1-2 hours)      Critically ill patients:  140 - 180 mg/dL   Results for [name], [name] (MRN [id number]) as of 07/08/2017 09:59  Ref. Range 07/07/2017 08:24 07/07/2017 12:11 07/07/2017 16:01 07/07/2017 19:28 07/07/2017 23:16 07/08/2017 03:43 07/08/2017 08:25  Glucose-Capillary Latest Ref Range: 65 - 99 mg/dL 163 (H) 196 (H) 197 (H) 189 (H) 216 (H) 199 (H) 232 (H)   Review of Glycemic Control  Current orders for Inpatient glycemic control: Novolog 0-15 units Q4H  Inpatient Diabetes Program Recommendations: Insulin - Tube Feeding Coverage: Glucose more elevated since starting tube feeding. Please consider ordering Novolog 3 units Q4H for tube feeding coverage. Please note that tube feeding coverage would be held or stopped if tube feeding on hold or stopped.  Thanks, Barnie Alderman, RN, MSN, CDE Diabetes Coordinator Inpatient Diabetes Program [phone] (Team Pager from 8am to 5pm)

## 2017-07-08 NOTE — Progress Notes (Signed)
Physical Therapy Treatment Patient Details Name: Robert Keith MRN: [id number] DOB: [birth year] Today's Date: 07/08/2017    History of Present Illness Pt is an 83 y.o. male who presented to the ED with global aphasia, Rt hemiplegia, and L gaze deviation. Pt with L M1 occlusion s/p tPA and mechanical thrombectomy with revascularization.  He was intubated 7/11-7/16. Pt had significant Rt thigh hematoma with Rt femoral pseudoaneurysm following sheath removal and underwent Rt femoral artery repair on 7/12. Pt transferred back to ICU on 07/04/17. PMHx: prostate cancer, CAD, history of GI bleed, DM2, essential tremor, hypertension, history of atrial fibrillation and subdural hematoma s/p craniectomy and clot evacuation.    PT Comments    Pt with flat affect unable to follow commands throughout session. Pt without withdrawal of RUE to noxious stimuli. Pt with maintained left gaze preference, unable to visually track. Pt noted to have automatic purposeful movements of LUE for scratching nose and reaching for gown but unable to follow any commands. Pt with resistance to rolling and otherwise total A+2 for all mobility with periods of minguard balance sitting. Pt with continued heavy assist and will continue to follow without significant gains made from eval with goals and D/C plan updated.    Follow Up Recommendations  Supervision/Assistance - 24 hour;SNF     Equipment Recommendations  Wheelchair (measurements PT);Wheelchair cushion (measurements PT);Other (comment)    Recommendations for Other Services       Precautions / Restrictions Precautions Precautions: Fall Precaution Comments: rt groin incision    Mobility  Bed Mobility Overal bed mobility: Needs Assistance Bed Mobility: Rolling;Supine to Sit;Sit to Supine Rolling: Total assist   Supine to sit: +2 for physical assistance;Total assist Sit to supine: +2 for physical assistance;Total assist   General bed mobility comments: total assist  to roll bil x 3 trials for pericare and positioning. assist for all aspects of transfer for supine<>sit with 2 person assist  Transfers                 General transfer comment: unable  Ambulation/Gait                 Stairs            Wheelchair Mobility    Modified Rankin (Stroke Patients Only) Modified Rankin (Stroke Patients Only) Pre-Morbid Rankin Score: No symptoms Modified Rankin: Severe disability     Balance Overall balance assessment: Needs assistance Sitting-balance support: Single extremity supported;Feet supported Sitting balance-Leahy Scale: Poor Sitting balance - Comments: EOB 10 min with min assist with pt able to prop on LUE for 2 min x 2 trials with minguard assist for balance. Posterior LOB with fatigue and pt unable to correct with commands and assist Postural control: Posterior lean                                  Cognition Arousal/Alertness: Awake/alert Behavior During Therapy: Flat affect Overall Cognitive Status: Difficult to assess Area of Impairment: Following commands                               General Comments: pt not following verbal or tactile commands at all today despite increased time and multimodal cues      Exercises      General Comments        Pertinent Vitals/Pain Pain Assessment:  (CPOT= 0)  Home Living                      Prior Function            PT Goals (current goals can now be found in the care plan section) Acute Rehab PT Goals Time For Goal Achievement: 07/22/17 Potential to Achieve Goals: Poor Progress towards PT goals: Not progressing toward goals - comment;Goals downgraded-see care plan    Frequency           PT Plan Discharge plan needs to be updated    Co-evaluation              AM-PAC PT "6 Clicks" Daily Activity  Outcome Measure  Difficulty turning over in bed (including adjusting bedclothes, sheets and blankets)?:  Total Difficulty moving from lying on back to sitting on the side of the bed? : Total Difficulty sitting down on and standing up from a chair with arms (e.g., wheelchair, bedside commode, etc,.)?: Total Help needed moving to and from a bed to chair (including a wheelchair)?: Total Help needed walking in hospital room?: Total Help needed climbing 3-5 steps with a railing? : Total 6 Click Score: 6    End of Session   Activity Tolerance: Patient tolerated treatment well Patient left: in bed;with call bell/phone within reach;with bed alarm set Nurse Communication: Mobility status;Need for lift equipment PT Visit Diagnosis: Muscle weakness (generalized) (M62.81);Hemiplegia and hemiparesis;Other abnormalities of gait and mobility (R26.89) Hemiplegia - Right/Left: Right Hemiplegia - dominant/non-dominant: Dominant Hemiplegia - caused by: Cerebral infarction     Time: 1222-1254 PT Time Calculation (min) (ACUTE ONLY): 32 min  Charges:  $Therapeutic Activity: 23-37 mins                    G Codes:       Elwyn Reach, PT [phone]   Joshuah Minella B Butler Vegh 07/08/2017, 1:52 PM

## 2017-07-08 NOTE — Progress Notes (Signed)
STROKE TEAM PROGRESS NOTE   SUBJECTIVE (INTERVAL HISTORY) His family is not at the bedside. Pt drowsy but awakens easily, no respiratory distress.   Pt has no acute event overnight.  CXR no change.   his Cre continues to improve  , today Cre 3.21, Na 145. Will continue IVF and added more free water. Currently on 1/2NS for hypernatremia.   Nephrology consult appreciated. OBJECTIVE Temp:  [98.4 F (36.9 C)-99.6 F (37.6 C)] 99.4 F (37.4 C) (07/23 1015) Pulse Rate:  [70-107] 107 (07/23 1015) Cardiac Rhythm: Atrial fibrillation;Bundle branch block (07/23 0815) Resp:  [19-25] 22 (07/23 1015) BP: (102-119)/(57-68) 115/59 (07/23 1015) SpO2:  [97 %-98 %] 97 % (07/23 1342) Weight:  [203 lb 14.8 oz (92.5 kg)] 203 lb 14.8 oz (92.5 kg) (07/23 0341)  CBC:   Recent Labs Lab 07/06/17 0226 07/07/17 0409  WBC 11.4* 11.1*  HGB 8.4* 8.1*  HCT 27.1* 26.6*  MCV 94.8 94.0  PLT 236 664    Basic Metabolic Panel:  Recent Labs Lab 07/02/17 0406  07/05/17 0235  07/07/17 0409 07/07/17 1410  NA 145  < > 148*  < > 145 148*  K 3.5  < > 4.0  < > 4.4 4.4  CL 112*  < > 114*  < > 110 114*  CO2 24  < > 24  < > 22 24  GLUCOSE 166*  < > 165*  < > 179* 223*  BUN 22*  < > 73*  < > 116* 95*  CREATININE 0.81  < > 2.93*  < > 4.50* 3.21*  CALCIUM 8.0*  < > 8.0*  < > 7.3* 7.6*  MG 2.2  --  2.8*  --   --   --   PHOS  --   --  5.1*  --   --   --   < > = values in this interval not displayed.  Lipid Panel:     Component Value Date/Time   CHOL 115 06/27/2017 0500   TRIG 86 06/27/2017 0500   HDL 20 (L) 06/27/2017 0500   CHOLHDL 5.8 06/27/2017 0500   VLDL 17 06/27/2017 0500   LDLCALC 78 06/27/2017 0500   HgbA1c:  Lab Results  Component Value Date   HGBA1C 6.8 (H) 06/27/2017   Urine Drug Screen: No results found for: LABOPIA, COCAINSCRNUR, LABBENZ, AMPHETMU, THCU, LABBARB  Alcohol Level No results found for: [place] I have personally reviewed the radiological images below and agree with the  radiology interpretations.  Ct Abdomen Pelvis Wo Contrast 06/27/2017 1. Hematoma within the proximal RIGHT anterior thigh is incompletely imaged. Hematoma likely spontaneous related to anticoagulation. Recommend clinical correlation with extent of hematoma in the RIGHT thigh.  2. No intraperitoneal or retroperitoneal hemorrhage.  3. Extensive diverticulosis without diverticulitis.  4.  Aortic Atherosclerosis (ICD10-I70.0).  5. Small bilateral pleural effusions.    Ct Angio Head and neck W and Wo Contrast 06/26/2017 1. Emergent large vessel occlusion of the left M1 segment.  2. There is some reconstitution of anterior left MCA branch vessels from pial collaterals.  3. Dense calcifications at the carotid bifurcations bilaterally without significant stenoses relative to the more distal vessels.  4. Moderate narrowing of the proximal posterior cerebral arteries bilaterally, right greater than left.  5. Calcifications at great vessel origins along the arch without significant stenosis. 6. Hypoplastic left vertebral artery.  7. Multilevel cervical spinal spondylosis.  8. Emphysema.  9. Prior granulomatous disease in the right upper lobe.  Cerebral aneurysm  and thrombectomy - Dr. Estanislado Pandy 06/26/2017 Status post endovascular complete revascularization of occluded left middle cerebral artery with 1 pass with Solitaire 4 mm x 40 mm FR retrieval device and a 6 [place] intermediary guide catheter achieving a TICI 3 reperfusion.  Mr Brain Wo Contrast 06/27/2017 Completed acute infarction affecting the majority of the inferior division left MCA distribution. This affects the left temporal lobe and posterior frontal lobe with patchy involvement in the parietal region.  Infarction of the caudate and putamen as well.  Petechial blood product deposition without focal hematoma.  Swelling but no shift at this time.  Ct Cerebral Perfusion W Contrast 06/26/2017 Left M1 emergent large vessel occlusion with  associated acute left MCA territory infarct.  Core infarct is estimated at 49 mL.  The tissue at risk is estimated at 182 mL.   Ct Head Code Stroke W/o Cm 06/26/2017 1. Focal hyperdensity in left distal M1 may represent thrombus.  2. No vascular territory infarct identified at this time.  3. Small left frontal subdural hematoma is stable in size and attenuation from 2014.  4. ASPECTS is 10   06/29/2017 1. Evolving large LEFT MCA territory infarct (LEFT basal ganglia, LEFT temporoparietal lobes) with petechial hemorrhage, no hemorrhagic conversion.  2. Acute small volume LEFT cerebrum extra-axial blood products.  3. Chronic changes include old small LEFT frontal subdural hematoma and small LEFT frontal encephalomalacia.    Transthoracic echocardiogram 06/28/2017 Study Conclusions - Left ventricle: The cavity size was normal. There was mild   concentric hypertrophy. Systolic function was normal. The    estimated ejection fraction was in the range of 50% to 55%.   Hypokinesis of the apical inferior and akinesis of the basal to   mid inferior myocardium. Doppler parameters are consistent with   indeterminate ventricular filling pressure. - Aortic valve: Valve mobility was restricted. There was mild   stenosis. There was mild regurgitation. Peak velocity (S): 300   cm/s. Mean gradient (S): 19 mm Hg. Valve area (VTI): 1.64 cm^2.   Valve area (Vmax): 1.49 cm^2. Valve area (Vmean): 1.39 cm^2.   Regurgitation pressure half-time: 723 ms. - Mitral valve: Transvalvular velocity was within the normal range.   There was no evidence for stenosis. There was no regurgitation. - Left atrium: The atrium was severely dilated. - Right ventricle: The cavity size was normal. Wall thickness was   normal. Systolic function was normal. - Tricuspid valve: There was mild regurgitation. - Pulmonary arteries: Systolic pressure was within the normal   range. PA peak pressure: 27 mm Hg (S)  Ct Head Wo  Contrast 07/04/2017 IMPRESSION: 1. Unchanged extent of cytotoxic edema within the posterior left MCA territory and left basal ganglia with mass effect on the left lateral ventricle but no midline shift or hydrocephalus. 2. Decreased number of foci petechial hemorrhage. No hemorrhagic conversion/parenchymal hematoma.   Dg Chest Port 1 View 07/04/2017 IMPRESSION: 1. Retrocardiac opacity presumably on the basis of a left effusion and compressive atelectasis. A pneumonia is not entirely excluded. 2. Mild unchanged vascular congestion is again seen with cardiomegaly and post CABG change.   LE venous doppler - There is no obvious evidence of deep or superficial vein thrombosis involving the right and left lower extremities. All clearly visualized vessels appear patent and compressible. There is no evidence of Baker's cysts bilaterally.  Dg Chest Port 1 View 07/05/2017 IMPRESSION: No change in left basilar changes.    PHYSICAL EXAM  Temp:  [98.4 F (36.9 C)-99.6 F (37.6 C)] 99.4  F (37.4 C) (07/23 1015) Pulse Rate:  [70-107] 107 (07/23 1015) Resp:  [19-25] 22 (07/23 1015) BP: (102-119)/(57-68) 115/59 (07/23 1015) SpO2:  [97 %-98 %] 97 % (07/23 1342) Weight:  [203 lb 14.8 oz (92.5 kg)] 203 lb 14.8 oz (92.5 kg) (07/23 0341)  General - Well nourished, well developed, awake alert.  Ophthalmologic - Fundi not visualized due to noncooperation.  Cardiovascular - irregularly irregular heart rate and rhythm with RVR.  Extremities - right groin wound dress dry and clean.   Neuro -   awake alert,on nasal canula ,   eyes open, not following commands  , eye able to attend both sides, blinking to visual threat on the left but not inconsistent on the right. PERRL. Right facial droop, tongue midline in mouth. Spontaneous movement of LUE and LLE, at least 3/5 LUE and 2+/5 LLE. RUE and RLE mild withdraw on pain stimulation. DTR 1+ and right positive babinski. Sensation, coordination and gait not  tested.    ASSESSMENT/PLAN Robert Keith is a 83 y.o. male with history of prostate cancer, CAD, history of GI bleed, DM2, essential tremor, hypertension, history of atrial fibrillation and subdural hematoma, s/p craniectomy and clot evacuation who presented with right facial droop, dense right hemiplegia global aphasia, and left gaze deviation. He recevied tPA at 1507 on 06/26/2017.  CTA Occluded left M1 -> IR thrombectomy TICI 3 reperfusion with Dr. Estanislado Pandy.  Stroke: left MCA infarct due to left M1 occlusion s/p thrombectomy due to afib not on AC  Resultant  aphasia and right hemiplegia  CT head:  Focal hyperdensity in left distal M1 may represent thrombus. Small left frontal subdural hematoma is stable in size and attenuation from 2014.   MRI head: left MCA infarct. Petechial blood product deposition without focal hematoma.  CT Angio Head: Emergent large vessel occlusion of the left M1 segment. Dense calcifications at the carotid bifurcations bilaterally without significant stenoses  2D Echo - EF 50-55%. Left atrium severely dilated. No cardiac source of emboli identified.  LDL 78  HgbA1c 6.8  SCDs for VTE prophylaxis Diet NPO time specified  No antithrombotic prior to admission, now on ASA 81mg . Pt deemed not candidate for anticoagulation per cardiology.  Patient counseled to be compliant with his antithrombotic medications  Ongoing aggressive stroke risk factor management  Therapy recommendations:  CIR  Disposition:   Transferred to stepdown  Right femoral pseudoaneurysm with massive thigh hematoma s/p IR due to PFA tear   VVS on board, appreciate recs  requiring surgical suturing and clot evacuation  Dress dry and clean  JP drain removed today  Afib not on [facility]  Not on AC at baseline due to previous SDH s/p crani and evacuation  Pt deemed not anticoagulation candidate per cardiology  On metoprolol for RVR  Rate currently 90-110  Cardiology on board and  recs appreciated  Respiratory distress  CCM on board  Off BiPAP  LE venous doppler no DVT  PE unlikely at this time  Chest PT, NT suctioning and nebulization per CCM   AKI  Cre 0.90->1.99->2.93  Etiology not clear, could be due to dehydration, sepsis  On IVF and free water  BMP monitoring  Foley catheter inserted 07/07/17  Hypernatremia  Na 146->150->148  Continue 1/2NS and free water  BMP monitoring  Leukocytosis   WBC 11.1->9.3->7.4->13.1 -> 11.9->13.2->9.9->14.9  Suspicious for aspiration  On [place]  CXR stable     Hyperlipidemia  Home meds: pravastatin 40mg  PO daily  LDL 78, goal <  70  Statin resumed  Continue statin at discharge  Diabetes  HgbA1c 6.8, goal < 7.0  Controlled  Hyperglycemia improved  Resume home medications  SSI  Anemia, improved  Due to acute blood loss  Hb 9.0->8.2->7.8->7.8->8.7->8.7->9.3->9.0  Close monitoring  PRBC transfusion if needed  Other Stroke Risk Factors  Advanced age  Overweight, Body mass index is 30.11 kg/m., recommend weight loss, diet and exercise as appropriate   Family hx stroke (Mother)  CAD  Other Active Problems  Thrombocytopenia, resolved  hypokalemia, resolved  GI bleed history  Acute renal failure-new onset likely obstructive due to bladder outlet obstruction   Hospital day # 12 Plan  Appreciate renal consult for worsening creatinine -hopefully foley drainage of obstruction will help renal function Speech therapy plan modified barium swallow   D/w with his   RN at bedside and answered questions. D/w Dr Justin Mend nephrologist This patient is critically ill due to large left MCA infarct s/p IR, right groin hematoma s/p evacuation, afib RVR, anemia, respiratory failure and at significant risk of neurological worsening, death form recurrent stroke, hemorrhagic transformation, shock, heart failure, aspiration pneumonia. This patient's care requires constant monitoring of vital  signs, hemodynamics, respiratory and cardiac monitoring, review of multiple databases, neurological assessment, discussion with family, other specialists and medical decision making of high complexity. I spent 30 minutes of neurocritical care time in the care of this patient. I had long discussion with daughter and son at bedside, updated pt current condition, treatment plan and potential prognosis. They expressed understanding and appreciation.   Antony Contras, MD   Stroke Neurology 07/08/2017 2:00 PM   To contact Stroke Continuity provider, please refer to [email or web address] After hours, contact General Neurology

## 2017-07-09 ENCOUNTER — Inpatient Hospital Stay (HOSPITAL_COMMUNITY): Payer: Medicare Other

## 2017-07-09 LAB — GLUCOSE, CAPILLARY
GLUCOSE-CAPILLARY: 204 mg/dL — AB (ref 65–99)
GLUCOSE-CAPILLARY: 223 mg/dL — AB (ref 65–99)
GLUCOSE-CAPILLARY: 187 mg/dL — AB (ref 65–99)
GLUCOSE-CAPILLARY: 183 mg/dL — AB (ref 65–99)
GLUCOSE-CAPILLARY: 184 mg/dL — AB (ref 65–99)
Glucose-Capillary: 204 mg/dL — ABNORMAL HIGH (ref 65–99)

## 2017-07-09 NOTE — NC FL2 (Signed)
[place] LEVEL OF CARE SCREENING TOOL     IDENTIFICATION  Patient Name: Robert Keith Birthdate: 12/09/1933 Sex: male Admission Date (Current Location): 06/26/2017  [facility] and [place] Number:  Engineering geologist and Address:  [facility], [place] [address], [place], [place] [address]      Provider Number: [id number]  Attending Physician Name and Address:  Garvin Fila, MD  Relative Name and Phone Number:       Current Level of Care: Hospital Recommended Level of Care: [place] Prior Approval Number:    Date Approved/Denied:   PASRR Number: [id number]  Discharge Plan: SNF    Current Diagnoses: Patient Active Problem List   Diagnosis Date Noted  . Pleural effusion   . Leukocytosis   . Hypernatremia   . AKI (acute kidney injury) ([place])   . Acute respiratory failure ([place])   . Aphasia   . Facial droop   . Chronic subdural hematoma ([place]) 06/27/2017  . Hematoma of thigh, right, initial encounter 06/27/2017  . Cytotoxic brain edema ([place]) 06/27/2017  . Postoperative groin pseudoaneurysm ([place]) 06/27/2017  . Hemorrhagic shock ([place])   . Acute CVA (cerebrovascular accident) ([place]) 06/26/2017  . Acute ischemic stroke ([place]) 06/26/2017  . Duodenal ulcer disease   . Acute blood loss anemia 09/17/2016  . Coronary artery disease due to lipid rich plaque s/p CABG 1996 09/17/2016  . Chronic atrial fibrillation ([place]) 09/17/2016  . History of subdural hematoma 09/17/2016  . Peptic ulcer disease 09/17/2016  . Type 2 diabetes mellitus with diabetic neuropathy, without long-term current use of insulin ([place]) 09/17/2016  . Essential hypertension 09/17/2016    Orientation RESPIRATION BLADDER Height & Weight      (Unable to assess)  Normal (Has not used Bipap since 7/19: 10/5 at 40%.) Incontinent, Indwelling catheter Weight: 198 lb 10.2 oz (90.1 kg) Height:  5\' 9"  (175.3 cm)  BEHAVIORAL SYMPTOMS/MOOD NEUROLOGICAL BOWEL NUTRITION  STATUS   (None)  (Acute CVA) Incontinent Feeding tube ([place]. Will advance diet prior to discharge.)  AMBULATORY STATUS COMMUNICATION OF NEEDS Skin   Total Care Non-Verbally Bruising, Other (Comment), Surgical wounds (Blister, Skin tear.)                       Personal Care Assistance Level of Assistance  Total care       Total Care Assistance: Maximum assistance   Functional Limitations Info  Sight, Hearing, Speech Sight Info: Adequate Hearing Info: Adequate Speech Info: Impaired    SPECIAL CARE FACTORS FREQUENCY  PT (By licensed PT), Blood pressure, OT (By licensed OT), Speech therapy     PT Frequency: 5 x week OT Frequency: 5 x week     Speech Therapy Frequency: 5 x week      Contractures Contractures Info: Not present    Additional Factors Info  Code Status, Allergies Code Status Info: Partial: ONLY use NIPPV/BiPAp only if indicated Allergies Info: Metformin and related, Tape, Bupropion, Fenofibrate Micronized, Gemfibrozil, Niacin, Phenobarbital, Simvastatin           Current Medications (07/09/2017):  This is the current hospital active medication list Current Facility-Administered Medications  Medication Dose Route Frequency Provider Last Rate Last Dose  . 0.45 % sodium chloride infusion   Intravenous Continuous Rosalin Hawking, MD 50 mL/hr at 07/08/17 1734    . 0.9 %  sodium chloride infusion  250 mL Intravenous PRN Omar Person, NP      .  acetaminophen (TYLENOL) tablet 650 mg  650 mg Oral Q4H PRN Amie Portland, MD   650 mg at 07/08/17 1735   Or  . acetaminophen (TYLENOL) solution 650 mg  650 mg Per Tube Q4H PRN Amie [place], MD   650 mg at 07/01/17 1256   Or  . acetaminophen (TYLENOL) suppository 650 mg  650 mg Rectal Q4H PRN Amie Portland, MD   650 mg at 06/28/17 0331  . albuterol (PROVENTIL) (2.5 MG/3ML) 0.083% nebulizer solution 2.5 mg  2.5 mg Nebulization BID Rosalin Hawking, MD   2.5 mg at 07/09/17 0755  . amLODipine (NORVASC) tablet 5 mg  5 mg  Per Tube Daily Bajbus, Lauren D, RPH   5 mg at 07/09/17 0855  . aspirin chewable tablet 81 mg  81 mg Per Tube Daily Wynell Balloon, RPH   81 mg at 07/09/17 0854  . chlorhexidine (PERIDEX) 0.12 % solution 15 mL  15 mL Mouth Rinse BID Rosalin Hawking, MD   15 mL at 07/09/17 0856  . feeding supplement (OSMOLITE 1.2 CAL) liquid 1,000 mL  1,000 mL Per Tube Q24H Rigoberto Noel, MD 60 mL/hr at 07/09/17 1434 1,000 mL at 07/09/17 1434  . feeding supplement (PRO-STAT SUGAR FREE 64) liquid 30 mL  30 mL Per Tube BID Desai, Rahul P, PA-C   30 mL at 07/09/17 0855  . fentaNYL (SUBLIMAZE) injection 25-50 mcg  25-50 mcg Intravenous Q1H PRN Kerney Elbe, MD   25 mcg at 07/04/17 2107  . free water 300 mL  300 mL Per Tube Q4H Pearson Grippe B, MD   300 mL at 07/09/17 1415  . glipiZIDE (GLUCOTROL) tablet 10 mg  10 mg Per Tube BID WC Bajbus, Lauren D, RPH   10 mg at 07/09/17 0855  . insulin aspart (novoLOG) injection 0-15 Units  0-15 Units Subcutaneous Q4H Juanito Doom, MD   5 Units at 07/09/17 1301  . MEDLINE mouth rinse  15 mL Mouth Rinse q12n4p Rosalin Hawking, MD   15 mL at 07/09/17 1304  . metoprolol tartrate (LOPRESSOR) injection 2.5-5 mg  2.5-5 mg Intravenous Q3H PRN Rigoberto Noel, MD   5 mg at 07/04/17 0501  . metoprolol tartrate (LOPRESSOR) tablet 100 mg  100 mg Per Tube BID Bajbus, Lauren D, RPH   100 mg at 07/09/17 0855  . ondansetron (ZOFRAN) injection 4 mg  4 mg Intravenous Q6H PRN Deveshwar, Sanjeev, MD      . pantoprazole sodium (PROTONIX) 40 mg/20 mL oral suspension 40 mg  40 mg Per Tube Daily Wynell Balloon, RPH   40 mg at 07/09/17 0855  . pioglitazone (ACTOS) tablet 15 mg  15 mg Per Tube Q breakfast Bajbus, Lauren D, RPH   15 mg at 07/09/17 0856  . pravastatin (PRAVACHOL) tablet 40 mg  40 mg Per Tube QHS Bajbus, Lauren D, RPH   40 mg at 07/08/17 2102  . senna-docusate (Senokot-S) tablet 1 tablet  1 tablet Per Tube QHS PRN Bajbus, Almeta Monas, RPH         Discharge Medications: Please see discharge  summary for a list of discharge medications.  Relevant Imaging Results:  Relevant Lab Results:   Additional Information SS#: [id number]  Candie Chroman, LCSW  I have personally examined this patient, reviewed notes, independently viewed imaging studies, participated in medical decision making and plan of care.ROS completed by me personally and pertinent positives fully documented  I have made any additions or clarifications directly to the above note. Agree with  note above.    Antony Contras, MD Medical Director [facility] Pager: [phone] 07/10/2017 1:16 PM

## 2017-07-09 NOTE — Progress Notes (Signed)
STROKE TEAM PROGRESS NOTE   SUBJECTIVE (INTERVAL HISTORY) His friend HPOA  is   at the bedside. Pt drowsy but awakens easily, no respiratory distress.   Pt has no acute event overnight.   His Cre continues to improve  , today Cre 1.32, Na 150.    Marland Kitchen OBJECTIVE Temp:  [97.5 F (36.4 C)-100 F (37.8 C)] 97.5 F (36.4 C) (07/24 1226) Pulse Rate:  [84-98] 85 (07/24 1226) Cardiac Rhythm: Atrial fibrillation (07/24 0741) Resp:  [18-26] 19 (07/24 1226) BP: (109-131)/(54-86) 114/67 (07/24 1226) SpO2:  [96 %-98 %] 98 % (07/24 1226) Weight:  [198 lb 10.2 oz (90.1 kg)] 198 lb 10.2 oz (90.1 kg) (07/24 0326)  CBC:   Recent Labs Lab 07/06/17 0226 07/07/17 0409  WBC 11.4* 11.1*  HGB 8.4* 8.1*  HCT 27.1* 26.6*  MCV 94.8 94.0  PLT 236 784    Basic Metabolic Panel:  Recent Labs Lab 07/05/17 0235  07/07/17 1410 07/08/17 1403  NA 148*  < > 148* 150*  K 4.0  < > 4.4 4.4  CL 114*  < > 114* 120*  CO2 24  < > 24 22  GLUCOSE 165*  < > 223* 219*  BUN 73*  < > 95* 48*  CREATININE 2.93*  < > 3.21* 1.32*  CALCIUM 8.0*  < > 7.6* 7.8*  MG 2.8*  --   --   --   PHOS 5.1*  --   --   --   < > = values in this interval not displayed.  Lipid Panel:     Component Value Date/Time   CHOL 115 06/27/2017 0500   TRIG 86 06/27/2017 0500   HDL 20 (L) 06/27/2017 0500   CHOLHDL 5.8 06/27/2017 0500   VLDL 17 06/27/2017 0500   LDLCALC 78 06/27/2017 0500   HgbA1c:  Lab Results  Component Value Date   HGBA1C 6.8 (H) 06/27/2017   Urine Drug Screen: No results found for: LABOPIA, COCAINSCRNUR, LABBENZ, AMPHETMU, THCU, LABBARB  Alcohol Level No results found for: [place] I have personally reviewed the radiological images below and agree with the radiology interpretations.  Ct Abdomen Pelvis Wo Contrast 06/27/2017 1. Hematoma within the proximal RIGHT anterior thigh is incompletely imaged. Hematoma likely spontaneous related to anticoagulation. Recommend clinical correlation with extent of hematoma  in the RIGHT thigh.  2. No intraperitoneal or retroperitoneal hemorrhage.  3. Extensive diverticulosis without diverticulitis.  4.  Aortic Atherosclerosis (ICD10-I70.0).  5. Small bilateral pleural effusions.    Ct Angio Head and neck W and Wo Contrast 06/26/2017 1. Emergent large vessel occlusion of the left M1 segment.  2. There is some reconstitution of anterior left MCA branch vessels from pial collaterals.  3. Dense calcifications at the carotid bifurcations bilaterally without significant stenoses relative to the more distal vessels.  4. Moderate narrowing of the proximal posterior cerebral arteries bilaterally, right greater than left.  5. Calcifications at great vessel origins along the arch without significant stenosis. 6. Hypoplastic left vertebral artery.  7. Multilevel cervical spinal spondylosis.  8. Emphysema.  9. Prior granulomatous disease in the right upper lobe.  Cerebral aneurysm and thrombectomy - Dr. Estanislado Pandy 06/26/2017 Status post endovascular complete revascularization of occluded left middle cerebral artery with 1 pass with Solitaire 4 mm x 40 mm FR retrieval device and a 6 [place] intermediary guide catheter achieving a TICI 3 reperfusion.  Mr Brain Wo Contrast 06/27/2017 Completed acute infarction affecting the majority of the inferior division left MCA distribution. This  affects the left temporal lobe and posterior frontal lobe with patchy involvement in the parietal region.  Infarction of the caudate and putamen as well.  Petechial blood product deposition without focal hematoma.  Swelling but no shift at this time.  Ct Cerebral Perfusion W Contrast 06/26/2017 Left M1 emergent large vessel occlusion with associated acute left MCA territory infarct.  Core infarct is estimated at 49 mL.  The tissue at risk is estimated at 182 mL.   Ct Head Code Stroke W/o Cm 06/26/2017 1. Focal hyperdensity in left distal M1 may represent thrombus.  2. No vascular  territory infarct identified at this time.  3. Small left frontal subdural hematoma is stable in size and attenuation from 2014.  4. ASPECTS is 10   06/29/2017 1. Evolving large LEFT MCA territory infarct (LEFT basal ganglia, LEFT temporoparietal lobes) with petechial hemorrhage, no hemorrhagic conversion.  2. Acute small volume LEFT cerebrum extra-axial blood products.  3. Chronic changes include old small LEFT frontal subdural hematoma and small LEFT frontal encephalomalacia.    Transthoracic echocardiogram 06/28/2017 Study Conclusions - Left ventricle: The cavity size was normal. There was mild   concentric hypertrophy. Systolic function was normal. The    estimated ejection fraction was in the range of 50% to 55%.   Hypokinesis of the apical inferior and akinesis of the basal to   mid inferior myocardium. Doppler parameters are consistent with   indeterminate ventricular filling pressure. - Aortic valve: Valve mobility was restricted. There was mild   stenosis. There was mild regurgitation. Peak velocity (S): 300   cm/s. Mean gradient (S): 19 mm Hg. Valve area (VTI): 1.64 cm^2.   Valve area (Vmax): 1.49 cm^2. Valve area (Vmean): 1.39 cm^2.   Regurgitation pressure half-time: 723 ms. - Mitral valve: Transvalvular velocity was within the normal range.   There was no evidence for stenosis. There was no regurgitation. - Left atrium: The atrium was severely dilated. - Right ventricle: The cavity size was normal. Wall thickness was   normal. Systolic function was normal. - Tricuspid valve: There was mild regurgitation. - Pulmonary arteries: Systolic pressure was within the normal   range. PA peak pressure: 27 mm Hg (S)  Ct Head Wo Contrast 07/04/2017 IMPRESSION: 1. Unchanged extent of cytotoxic edema within the posterior left MCA territory and left basal ganglia with mass effect on the left lateral ventricle but no midline shift or hydrocephalus. 2. Decreased number of foci petechial  hemorrhage. No hemorrhagic conversion/parenchymal hematoma.   Dg Chest Port 1 View 07/04/2017 IMPRESSION: 1. Retrocardiac opacity presumably on the basis of a left effusion and compressive atelectasis. A pneumonia is not entirely excluded. 2. Mild unchanged vascular congestion is again seen with cardiomegaly and post CABG change.   LE venous doppler - There is no obvious evidence of deep or superficial vein thrombosis involving the right and left lower extremities. All clearly visualized vessels appear patent and compressible. There is no evidence of Baker's cysts bilaterally.  Dg Chest Port 1 View 07/05/2017 IMPRESSION: No change in left basilar changes.    PHYSICAL EXAM  Temp:  [97.5 F (36.4 C)-100 F (37.8 C)] 97.5 F (36.4 C) (07/24 1226) Pulse Rate:  [84-98] 85 (07/24 1226) Resp:  [18-26] 19 (07/24 1226) BP: (109-131)/(54-86) 114/67 (07/24 1226) SpO2:  [96 %-98 %] 98 % (07/24 1226) Weight:  [198 lb 10.2 oz (90.1 kg)] 198 lb 10.2 oz (90.1 kg) (07/24 0326)  General - Well nourished, well developed, awake alert.  Ophthalmologic - Fundi not  visualized due to noncooperation.  Cardiovascular - irregularly irregular heart rate and rhythm with RVR.  Extremities - right groin wound dress dry and clean.   Neuro -   awake alert,on nasal canula ,   eyes open, not following commands  , eye able to attend both sides, blinking to visual threat on the left but not inconsistent on the right. PERRL. Right facial droop, tongue midline in mouth. Spontaneous movement of LUE and LLE, at least 3/5 LUE and 2+/5 LLE. RUE and RLE mild withdraw on pain stimulation. DTR 1+ and right positive babinski. Sensation, coordination and gait not tested.    ASSESSMENT/PLAN Mr. ARNIE CLINGENPEEL is a 83 y.o. male with history of prostate cancer, CAD, history of GI bleed, DM2, essential tremor, hypertension, history of atrial fibrillation and subdural hematoma, s/p craniectomy and clot evacuation who presented with  right facial droop, dense right hemiplegia global aphasia, and left gaze deviation. He recevied tPA at 1507 on 06/26/2017.  CTA Occluded left M1 -> IR thrombectomy TICI 3 reperfusion with Dr. Estanislado Pandy.  Stroke: left MCA infarct due to left M1 occlusion s/p thrombectomy due to afib not on AC  Resultant  aphasia and right hemiplegia  CT head:  Focal hyperdensity in left distal M1 may represent thrombus. Small left frontal subdural hematoma is stable in size and attenuation from 2014.   MRI head: left MCA infarct. Petechial blood product deposition without focal hematoma.  CT Angio Head: Emergent large vessel occlusion of the left M1 segment. Dense calcifications at the carotid bifurcations bilaterally without significant stenoses  2D Echo - EF 50-55%. Left atrium severely dilated. No cardiac source of emboli identified.  LDL 78  HgbA1c 6.8  SCDs for VTE prophylaxis Diet NPO time specified  No antithrombotic prior to admission, now on ASA 81mg . Pt deemed not candidate for anticoagulation per cardiology.  Patient counseled to be compliant with his antithrombotic medications  Ongoing aggressive stroke risk factor management  Therapy recommendations:  CIR  Disposition:   Transferred to stepdown  Right femoral pseudoaneurysm with massive thigh hematoma s/p IR due to PFA tear   VVS on board, appreciate recs  requiring surgical suturing and clot evacuation  Dress dry and clean  JP drain removed today  Afib not on [facility]  Not on AC at baseline due to previous SDH s/p crani and evacuation  Pt deemed not anticoagulation candidate per cardiology  On metoprolol for RVR  Rate currently 90-110  Cardiology on board and recs appreciated  Respiratory distress  CCM on board  Off BiPAP  LE venous doppler no DVT  PE unlikely at this time  Chest PT, NT suctioning and nebulization per CCM   AKI  Cre 0.90->1.99->2.93  Etiology not clear, could be due to dehydration,  sepsis  On IVF and free water  BMP monitoring  Foley catheter inserted 07/07/17  Hypernatremia  Na 146->150->148  Continue 1/2NS and free water  BMP monitoring  Leukocytosis   WBC 11.1->9.3->7.4->13.1 -> 11.9->13.2->9.9->14.9  Suspicious for aspiration  On [place]  CXR stable     Hyperlipidemia  Home meds: pravastatin 40mg  PO daily  LDL 78, goal < 70  Statin resumed  Continue statin at discharge  Diabetes  HgbA1c 6.8, goal < 7.0  Controlled  Hyperglycemia improved  Resume home medications  SSI  Anemia, improved  Due to acute blood loss  Hb 9.0->8.2->7.8->7.8->8.7->8.7->9.3->9.0  Close monitoring  PRBC transfusion if needed  Other Stroke Risk Factors  Advanced age  Overweight, Body mass index  is 29.33 kg/m., recommend weight loss, diet and exercise as appropriate   Family hx stroke (Mother)  CAD  Other Active Problems  Thrombocytopenia, resolved  hypokalemia, resolved  GI bleed history  Acute renal failure-new onset likely obstructive due to bladder outlet obstruction now resolved   Hospital day # 13 Plan   renal function now much improved. Speech therapy plan modified barium swallow   D/w with his   RN and HPOAat bedside and answered questions.  Patient's daughter is arriving tomorrow and will discuss goals of care and PEG tube with her This patient is critically ill due to large left MCA infarct s/p IR, right groin hematoma s/p evacuation, afib RVR, anemia, respiratory failure and at significant risk of neurological worsening, death form recurrent stroke, hemorrhagic transformation, shock, heart failure, aspiration pneumonia. This patient's care requires constant monitoring of vital signs, hemodynamics, respiratory and cardiac monitoring, review of multiple databases, neurological assessment, discussion with family, other specialists and medical decision making of high complexity. I spent 30 minutes of neurocritical care time in the  care of this patient. I had long discussion with daughter and son at bedside, updated pt current condition, treatment plan and potential prognosis. They expressed understanding and appreciation.   Antony Contras, MD   Stroke Neurology 07/09/2017 2:14 PM   To contact Stroke Continuity provider, please refer to [email or web address] After hours, contact General Neurology

## 2017-07-09 NOTE — Clinical Social Work Placement (Signed)
   CLINICAL SOCIAL WORK PLACEMENT  NOTE  Date:  07/09/2017  Patient Details  Name: Robert Keith MRN: [id number] Date of Birth: [birth year]  Clinical Social Work is seeking post-discharge placement for this patient at the [place] level of care (*CSW will initial, date and re-position this form in  chart as items are completed):  Yes   Patient/family provided with [place] Work Department's list of facilities offering this level of care within the geographic area requested by the patient (or if unable, by the patient's family).  Yes   Patient/family informed of their freedom to choose among providers that offer the needed level of care, that participate in Medicare, Medicaid or managed care program needed by the patient, have an available bed and are willing to accept the patient.  Yes   Patient/family informed of [place]'s ownership interest in [facility] and [facility], as well as of the fact that they are under no obligation to receive care at these facilities.  PASRR submitted to EDS on 07/09/17     PASRR number received on 07/09/17     Existing PASRR number confirmed on       FL2 transmitted to all facilities in geographic area requested by pt/family on 07/09/17     FL2 transmitted to all facilities within larger geographic area on       Patient informed that his/her managed care company has contracts with or will negotiate with certain facilities, including the following:            Patient/family informed of bed offers received.  Patient chooses bed at       Physician recommends and patient chooses bed at      Patient to be transferred to   on  .  Patient to be transferred to facility by       Patient family notified on   of transfer.  Name of family member notified:        PHYSICIAN       Additional Comment:    _______________________________________________ Candie Chroman, LCSW 07/09/2017, 2:44 PM

## 2017-07-09 NOTE — Clinical Social Work Note (Signed)
Clinical Social Work Assessment  Patient Details  Name: Robert Keith MRN: [id number] Date of Birth: 04/01/1933  Date of referral:  07/09/17               Reason for consult:  Facility Placement, Discharge Planning                Permission sought to share information with:  Facility Sport and exercise psychologist, Family Supports Permission granted to share information::  Yes, Verbal Permission Granted  Name::     Robert Keith, Robert Keith::  SNF's  Relationship::  Daughter and caregiver: Both share Keith  Contact Information:  Robert Keith: [phone]: [phone]  Housing/Transportation Living arrangements for the past 2 months:  [place] of Information:  Medical Team, Power of Attorney Patient Interpreter Needed:  None Criminal Activity/Legal Involvement Pertinent to Current Situation/Hospitalization:  No - Comment as needed Significant Relationships:  Adult Children, Other(Comment) (Caregivers) Lives with:  Self, Other (Comment) (Has 24/7 caregivers) Do you feel safe going back to the place where you live?  Yes Need for family participation in patient care:  Yes (Comment)  Care giving concerns:  PT recommending SNF once medically stable for discharge.   Social Worker assessment / plan:  RN unable to assess orientation. Caregiver/Keith at bedside. CSW introduced role and explained that PT recommendations would be discussed. She is agreeable to SNF but would like CSW to contact patient's daughter, Robert Keith. Robert Keith lives in [place] but will be here in the next 1-2 days. Patient's caregiver states that in the past the patient's daughter has looked into [facility]. CSW called patient's daughter who is agreeable to SNF placement if patient unable to go to CIR. Discussed referral process. No further concerns. CSW encouraged patient's Keith's to contact CSW as needed. CSW will continue to follow patient and his support system for support and  facilitate discharge to SNF, if needed, once medically stable.   Employment status:  Retired Nurse, adult PT Recommendations:  [place] / Referral to community resources:  [place]  Patient/Family's Response to care:  Unable to assess orientation. Patient's Keith's are agreeable to SNF placement. Patient's family and caregivers are supportive and involved in patient's care. Patient's Keith's appreciated social work intervention.  Patient/Family's Understanding of and Emotional Response to Diagnosis, Current Treatment, and Prognosis:  Unable to assess orientation. Patient's Keith's have a good understanding of the reason for admission and his need for rehab prior to returning home, whichever venue that might be. Patient's Keith's appear happy with hospital care.  Emotional Assessment Appearance:  Appears stated age Attitude/Demeanor/Rapport:  Unable to Assess Affect (typically observed):  Unable to Assess Orientation:   (Unable to assess due to expressive aphasia) Alcohol / Substance use:  Never Used Psych involvement (Current and /or in the community):  No (Comment)  Discharge Needs  Concerns to be addressed:  Care Coordination Readmission within the last 30 days:  No Current discharge risk:  Cognitively Impaired, Dependent with Mobility Barriers to Discharge:  Continued Medical Work up, Other ([place])   Candie Chroman, LCSW 07/09/2017, 2:41 PM

## 2017-07-09 NOTE — Progress Notes (Signed)
Inpatient Diabetes Program Recommendations  AACE/ADA: New Consensus Statement on Inpatient Glycemic Control (2015)  Target Ranges:  Prepandial:   less than 140 mg/dL      Peak postprandial:   less than 180 mg/dL (1-2 hours)      Critically ill patients:  140 - 180 mg/dL   Results for PRITESH, SOBECKI (MRN [id number]) as of 07/09/2017 11:39  Ref. Range 07/08/2017 08:25 07/08/2017 11:14 07/08/2017 16:45 07/08/2017 19:31 07/08/2017 23:03 07/09/2017 03:27 07/09/2017 07:19  Glucose-Capillary Latest Ref Range: 65 - 99 mg/dL 232 (H) 274 (H) 171 (H) 214 (H) 183 (H) 204 (H) 223 (H)   Review of Glycemic Control  Current orders for Inpatient glycemic control: Novolog 0-15 units Q4H  Inpatient Diabetes Program Recommendations: Insulin - Tube Feeding Coverage: Glucose consistently elevated since starting tube feeding. Please consider ordering Novolog 3 units Q4H for tube feeding coverage. Please note that tube feeding coverage would be held or stopped if tube feeding on hold or stopped.  Thanks, Barnie Alderman, RN, MSN, CDE Diabetes Coordinator Inpatient Diabetes Program [phone] (Team Pager from 8am to 5pm)

## 2017-07-09 NOTE — Progress Notes (Addendum)
Progress Note  SUBJECTIVE:    Awakes easily  OBJECTIVE:   Vitals:   07/09/17 0326 07/09/17 0700  BP: 131/65   Pulse: 98   Resp: 18   Temp: 98.7 F (37.1 C) 99.3 F (37.4 C)    Intake/Output Summary (Last 24 hours) at 07/09/17 1017 Last data filed at 07/09/17 0900  Gross per 24 hour  Intake             3636 ml  Output             5350 ml  Net            -1714 ml   Right groin incision healing.  Open area medial to incision from radiology cannulation. Some fibrinous exudate. No purulence. No drainage seen.    ASSESSMENT/PLAN:   83 y.o. male is s/p: right groin exploration and evacuation of thigh hematoma 12 Days Post-Op   Staples removed as incision is healing well. There is a small open area from radiology cannulation medial to incision that will need to be packed twice daily.   Robert Keith 07/09/2017 10:17 AM -- LABS:   CBC    Component Value Date/Time   WBC 11.1 (H) 07/07/2017 0409   HGB 8.1 (L) 07/07/2017 0409   HGB 15.2 12/22/2014 1430   HCT 26.6 (L) 07/07/2017 0409   HCT 46.1 12/22/2014 1430   PLT 242 07/07/2017 0409   PLT 194 12/22/2014 1430    BMET    Component Value Date/Time   NA 150 (H) 07/08/2017 1403   NA 142 12/22/2014 1430   K 4.4 07/08/2017 1403   K 4.3 12/22/2014 1430   CL 120 (H) 07/08/2017 1403   CL 108 (H) 12/22/2014 1430   CO2 22 07/08/2017 1403   CO2 27 12/22/2014 1430   GLUCOSE 219 (H) 07/08/2017 1403   GLUCOSE 119 (H) 12/22/2014 1430   BUN 48 (H) 07/08/2017 1403   BUN 20 (H) 12/22/2014 1430   CREATININE 1.32 (H) 07/08/2017 1403   CREATININE 1.13 12/22/2014 1430   CALCIUM 7.8 (L) 07/08/2017 1403   CALCIUM 8.8 12/22/2014 1430   GFRNONAA 49 (L) 07/08/2017 1403   GFRNONAA >60 12/22/2014 1430   GFRAA 56 (L) 07/08/2017 1403   GFRAA >60 12/22/2014 1430    COAG Lab Results  Component Value Date   INR 1.59 06/27/2017   INR 1.67 06/27/2017   INR 1.13 06/26/2017   No results found for: PTT  ANTIBIOTICS:    Anti-infectives    Start     Dose/Rate Route Frequency Ordered Stop   07/07/17 0859  cefTAZidime (FORTAZ) 1 g in dextrose 5 % 50 mL IVPB  Status:  Discontinued     1 g 100 mL/hr over 30 Minutes Intravenous Every 24 hours 07/06/17 1547 07/08/17 1622   06/30/17 1300  vancomycin (VANCOCIN) 1,250 mg in sodium chloride 0.9 % 250 mL IVPB  Status:  Discontinued     1,250 mg 166.7 mL/hr over 90 Minutes Intravenous Every 12 hours 06/29/17 2351 07/02/17 0907   06/30/17 0030  cefTAZidime (FORTAZ) 1 g in dextrose 5 % 50 mL IVPB  Status:  Discontinued     1 g 100 mL/hr over 30 Minutes Intravenous Every 8 hours 06/29/17 2350 07/06/17 1547   06/30/17 0000  vancomycin (VANCOCIN) 2,000 mg in sodium chloride 0.9 % 500 mL IVPB     2,000 mg 250 mL/hr over 120 Minutes Intravenous  Once 06/29/17 2350 06/30/17 0336   06/26/17 1627  ceFAZolin (  ANCEF) 2-4 GM/100ML-% IVPB    Comments:  Desiree Hane   : cabinet override      06/26/17 1627 06/27/17 [place], PA-C [facility] Office: [phone] Pager: [phone] 07/09/2017 10:17 AM   Addendum  I agree with the physician assistant's findings.  The residual defect medial to incision line is from the IR procedure.  Would manage with wet-to-dry dressing to that opening daily.    - Will periodically check on patient    Robert Barthel, MD, FACS [facility] [place] Office: [phone] Pager: [phone]  07/09/2017, 11:00 AM

## 2017-07-09 NOTE — Care Management Note (Signed)
Case Management Note  Patient Details  Name: FELDER LEBEDA MRN: [id number] Date of Birth: 09-Oct-1933  Subjective/Objective:   L MCA infarct s/p  Thrombectomy , right femoral pseudoaneurysm with massive thigh hematoma, aki, ivf's iv abx. Discussed in LOS appropriate for continued stay. Plan is CIR vs SNF.                 Action/Plan: NCM will follow along with CSW  For dc needs.  Expected Discharge Date:                  Expected Discharge Plan:  IP Rehab Facility  In-House Referral:  Clinical Social Work  Discharge planning Services  CM Consult  Post Acute Care Choice:    Choice offered to:     DME Arranged:    DME Agency:     HH Arranged:    [place] Agency:     Status of Service:  In process, will continue to follow  If discussed at Long Length of Stay Meetings, dates discussed:    Additional Comments:  Zenon Mayo, RN 07/09/2017, 9:31 AM

## 2017-07-09 NOTE — Progress Notes (Signed)
Occupational Therapy Treatment Patient Details Name: KRUZE ATCHLEY MRN: [id number] DOB: Jun 03, 1933 Today's Date: 07/09/2017    History of present illness Pt is an 83 y.o. male who presented to the ED with global aphasia, Rt hemiplegia, and L gaze deviation. Pt with L M1 occlusion s/p tPA and mechanical thrombectomy with revascularization.  He was intubated 7/11-7/16. Pt had significant Rt thigh hematoma with Rt femoral pseudoaneurysm following sheath removal and underwent Rt femoral artery repair on 7/12. Pt transferred back to ICU on 07/04/17. PMHx: prostate cancer, CAD, history of GI bleed, DM2, essential tremor, hypertension, history of atrial fibrillation and subdural hematoma s/p craniectomy and clot evacuation.   OT comments  Pt alert for periods of time during session, then would close his eyes. Requires total assist for rolling for repositioning. Hand over hand assist for attempt to use common ADL items of wash cloth and toothbrush. Did not demonstrate any purposeful movement of UEs. Pt with improvement in ability to visually track to R this visit, but inconsistent. Updated d/c disposition to SNF.  Follow Up Recommendations  SNF;Supervision/Assistance - 24 hour    Equipment Recommendations       Recommendations for Other Services      Precautions / Restrictions Precautions Precautions: Fall       Mobility Bed Mobility Overal bed mobility: Needs Assistance Bed Mobility: Rolling Rolling: Total assist         General bed mobility comments: no attempt to initiate rolling  Transfers                      Balance                                           ADL either performed or assessed with clinical judgement   ADL Overall ADL's : Needs assistance/impaired     Grooming: Wash/dry face;Bed level;Oral care;Total assistance                                 General ADL Comments: RN reports pt more alert and nodding head earlier  today.     Vision   Additional Comments: pt tracking visual target to R inconsistently with auditory cues   Perception     Praxis      Cognition Arousal/Alertness: Awake/alert;Lethargic Behavior During Therapy: Flat affect Overall Cognitive Status: Difficult to assess Area of Impairment: Following commands                               General Comments: pt not following verbal or tactile commands at all today despite increased time and multimodal cues        Exercises Exercises: General Upper Extremity General Exercises - Upper Extremity Shoulder Flexion: PROM;Both;10 reps;Supine Shoulder ABduction: PROM;Both;10 reps;Supine Shoulder Horizontal ABduction: PROM;Both;10 reps;Supine Elbow Flexion: PROM;Both;10 reps;Supine Wrist Flexion: PROM;Both;10 reps;Supine Wrist Extension: PROM;Both;10 reps;Supine Digit Composite Flexion: PROM;Both;10 reps;Supine Composite Extension: PROM;Both;10 reps;Seated   Shoulder Instructions       General Comments      Pertinent Vitals/ Pain       Pain Assessment: Faces Faces Pain Scale: Hurts little more Pain Location: L middle finger with ROM Pain Descriptors / Indicators: Grimacing Pain Intervention(s): Monitored during session;Repositioned  Home Living  Prior Functioning/Environment              Frequency  Min 3X/week        Progress Toward Goals  OT Goals(current goals can now be found in the care plan section)  Progress towards OT goals: Progressing toward goals  Acute Rehab OT Goals Patient Stated Goal: unable to state OT Goal Formulation: Patient unable to participate in goal setting Time For Goal Achievement: 07/16/17 Potential to Achieve Goals: [place] Discharge plan needs to be updated    Co-evaluation                 AM-PAC PT "6 Clicks" Daily Activity     Outcome Measure   Help from another person eating meals?:  Total Help from another person taking care of personal grooming?: Total Help from another person toileting, which includes using toliet, bedpan, or urinal?: Total Help from another person bathing (including washing, rinsing, drying)?: Total Help from another person to put on and taking off regular upper body clothing?: Total Help from another person to put on and taking off regular lower body clothing?: Total 6 Click Score: 6    End of Session    OT Visit Diagnosis: Other abnormalities of gait and mobility (R26.89);Hemiplegia and hemiparesis;Cognitive communication deficit (R41.841) Symptoms and signs involving cognitive functions: Cerebral infarction Hemiplegia - Right/Left: Right Hemiplegia - caused by: Cerebral infarction   Activity Tolerance Patient tolerated treatment well   Patient Left in bed;with call bell/phone within reach;with bed alarm set   Nurse Communication  (minimal participation in session)        Time: 4827-0786 OT Time Calculation (min): 15 min  Charges: OT General Charges $OT Visit: 1 Procedure OT Treatments $Therapeutic Activity: 8-22 mins    Malka So 07/09/2017, 2:22 PM  [phone]

## 2017-07-09 NOTE — Progress Notes (Addendum)
Nutrition Follow-up  DOCUMENTATION CODES:   Not applicable  INTERVENTION:   -Continue Osmolite 1.2 @ 60 ml/hr via cortrak tube  30 ml Prostat BID.    Tube feeding regimen provides 1928 kcal (100% of needs), 109 gm protein, 1180 ml free water daily  NUTRITION DIAGNOSIS:   Inadequate oral intake related to inability to eat as evidenced by NPO status.  Ongoing  GOAL:   Patient will meet greater than or equal to 90% of their needs  Met with TF  MONITOR:   TF tolerance, Weight trends, I & O's, Diet advancement  REASON FOR ASSESSMENT:   Consult Assessment of nutrition requirement/status  ASSESSMENT:   83 year old male with past medical history of afib (not on NOAC 2/2 GI bleed), HTN, prior SDH in 2014 that required evacuation with residual small chronic SDH), DM, CAD, tremor, H. Pylori infection, and prostate cancer who presented 7/11 from home with witnessed onset of right facial droop, right hemiplegia and aphasia at 1330.   7/20- s/p BSE, recommend remain NPO due to delayed swallow initiation 7/21- s/p nephrology evaluation; pt poor HD candidate, free water increased to 300 ml every 4 hours  Case discussed with RN, who reports pt is making minimal progress. Pt will wake for brief periods and shake head "no", however, unsure how much pt comprehends. Pt to undergo MBSS today, however, RN does not think pt will be advanced to PO diet.   Pt remains on TF via cortrak tube; receiving Osmolite 1.2 @ 60 ml/hr with 30 ml Prostat BID. Complete regimen provides 1928 kcal, 109 gm protein, 1180 ml free water daily (2980 ml free water with inclusion of free water flush regimen), meeting 100% of estimated kcal and protein needs.   Inpatient DM medications include 10 mg glucotrol BID, 15 mg actos daily, and sliding scale correction coverage.   Labs reviewed: CBGS: 171-223.  Diet Order:  Diet NPO time specified  Skin:   (rt groin incision)  Last BM:  07/09/17  Height:   Ht  Readings from Last 1 Encounters:  07/04/17 5' 9"  (1.753 m)    Weight:   Wt Readings from Last 1 Encounters:  07/09/17 198 lb 10.2 oz (90.1 kg)    Ideal Body Weight:  72.7 kg  BMI:  Body mass index is 29.33 kg/m.  Estimated Nutritional Needs:   Kcal:  1900-2100  Protein:  105-115 grams  Fluid:  1.9-2.1 L  EDUCATION NEEDS:   No education needs identified at this time  Robert Keith A. Jimmye Norman, RD, LDN, CDE Pager: [phone] After hours Pager: [phone]

## 2017-07-09 NOTE — Progress Notes (Signed)
Rehab admissions - Noted patient requiring total assist +2 and unable to do transfers with PT.  Not making much progress thus far.  Agree with pursuit of SNF placement.  If he shows some good progress, then we can revisit possible inpatient rehab admission.  Call me for questions.  #[phone]

## 2017-07-10 ENCOUNTER — Inpatient Hospital Stay (HOSPITAL_COMMUNITY): Payer: Medicare Other

## 2017-07-10 LAB — GLUCOSE, CAPILLARY
GLUCOSE-CAPILLARY: 170 mg/dL — AB (ref 65–99)
GLUCOSE-CAPILLARY: 207 mg/dL — AB (ref 65–99)
Glucose-Capillary: 166 mg/dL — ABNORMAL HIGH (ref 65–99)
Glucose-Capillary: 189 mg/dL — ABNORMAL HIGH (ref 65–99)
Glucose-Capillary: 176 mg/dL — ABNORMAL HIGH (ref 65–99)
Glucose-Capillary: 157 mg/dL — ABNORMAL HIGH (ref 65–99)

## 2017-07-10 MED ORDER — NYSTATIN 100000 UNIT/ML MT SUSP
5.0000 mL | Freq: Four times a day (QID) | OROMUCOSAL | Status: DC
  Administered 2017-07-10 – 2017-07-17 (×27): 500000 [IU] via OROMUCOSAL
  Filled 2017-07-10 (×28): qty 5

## 2017-07-10 MED ORDER — ALBUTEROL SULFATE (2.5 MG/3ML) 0.083% IN NEBU: 2.5000 mg | INHALATION_SOLUTION | RESPIRATORY_TRACT | Status: DC | PRN

## 2017-07-10 NOTE — Progress Notes (Signed)
STROKE TEAM PROGRESS NOTE   SUBJECTIVE (INTERVAL HISTORY) His friend HPOA  is   at the bedside. Pt  awake, no respiratory distress.   Pt has no acute event overnight.    He is working at the bedside with physical therapy.    . OBJECTIVE Temp:  [98.5 F (36.9 C)-100.2 F (37.9 C)] 99 F (37.2 C) (07/25 1100) Pulse Rate:  [81-112] 95 (07/25 0403) Cardiac Rhythm: Atrial fibrillation (07/25 0823) Resp:  [17-23] 23 (07/25 0403) BP: (112-119)/(58-69) 114/67 (07/25 0403) SpO2:  [97 %-99 %] 97 % (07/25 0403) Weight:  [190 lb 4.1 oz (86.3 kg)] 190 lb 4.1 oz (86.3 kg) (07/25 0418)  CBC:   Recent Labs Lab 07/06/17 0226 07/07/17 0409  WBC 11.4* 11.1*  HGB 8.4* 8.1*  HCT 27.1* 26.6*  MCV 94.8 94.0  PLT 236 951    Basic Metabolic Panel:  Recent Labs Lab 07/05/17 0235  07/07/17 1410 07/08/17 1403  NA 148*  < > 148* 150*  K 4.0  < > 4.4 4.4  CL 114*  < > 114* 120*  CO2 24  < > 24 22  GLUCOSE 165*  < > 223* 219*  BUN 73*  < > 95* 48*  CREATININE 2.93*  < > 3.21* 1.32*  CALCIUM 8.0*  < > 7.6* 7.8*  MG 2.8*  --   --   --   PHOS 5.1*  --   --   --   < > = values in this interval not displayed.  Lipid Panel:     Component Value Date/Time   CHOL 115 06/27/2017 0500   TRIG 86 06/27/2017 0500   HDL 20 (L) 06/27/2017 0500   CHOLHDL 5.8 06/27/2017 0500   VLDL 17 06/27/2017 0500   LDLCALC 78 06/27/2017 0500   HgbA1c:  Lab Results  Component Value Date   HGBA1C 6.8 (H) 06/27/2017   Urine Drug Screen: No results found for: LABOPIA, COCAINSCRNUR, LABBENZ, AMPHETMU, THCU, LABBARB  Alcohol Level No results found for: [place] I have personally reviewed the radiological images below and agree with the radiology interpretations.  Ct Abdomen Pelvis Wo Contrast 06/27/2017 1. Hematoma within the proximal RIGHT anterior thigh is incompletely imaged. Hematoma likely spontaneous related to anticoagulation. Recommend clinical correlation with extent of hematoma in the RIGHT thigh.   2. No intraperitoneal or retroperitoneal hemorrhage.  3. Extensive diverticulosis without diverticulitis.  4.  Aortic Atherosclerosis (ICD10-I70.0).  5. Small bilateral pleural effusions.    Ct Angio Head and neck W and Wo Contrast 06/26/2017 1. Emergent large vessel occlusion of the left M1 segment.  2. There is some reconstitution of anterior left MCA branch vessels from pial collaterals.  3. Dense calcifications at the carotid bifurcations bilaterally without significant stenoses relative to the more distal vessels.  4. Moderate narrowing of the proximal posterior cerebral arteries bilaterally, right greater than left.  5. Calcifications at great vessel origins along the arch without significant stenosis. 6. Hypoplastic left vertebral artery.  7. Multilevel cervical spinal spondylosis.  8. Emphysema.  9. Prior granulomatous disease in the right upper lobe.  Cerebral aneurysm and thrombectomy - Dr. Estanislado Pandy 06/26/2017 Status post endovascular complete revascularization of occluded left middle cerebral artery with 1 pass with Solitaire 4 mm x 40 mm FR retrieval device and a 6 [place] intermediary guide catheter achieving a TICI 3 reperfusion.  Mr Brain Wo Contrast 06/27/2017 Completed acute infarction affecting the majority of the inferior division left MCA distribution. This affects the left temporal  lobe and posterior frontal lobe with patchy involvement in the parietal region.  Infarction of the caudate and putamen as well.  Petechial blood product deposition without focal hematoma.  Swelling but no shift at this time.  Ct Cerebral Perfusion W Contrast 06/26/2017 Left M1 emergent large vessel occlusion with associated acute left MCA territory infarct.  Core infarct is estimated at 49 mL.  The tissue at risk is estimated at 182 mL.   Ct Head Code Stroke W/o Cm 06/26/2017 1. Focal hyperdensity in left distal M1 may represent thrombus.  2. No vascular territory infarct identified  at this time.  3. Small left frontal subdural hematoma is stable in size and attenuation from 2014.  4. ASPECTS is 10   06/29/2017 1. Evolving large LEFT MCA territory infarct (LEFT basal ganglia, LEFT temporoparietal lobes) with petechial hemorrhage, no hemorrhagic conversion.  2. Acute small volume LEFT cerebrum extra-axial blood products.  3. Chronic changes include old small LEFT frontal subdural hematoma and small LEFT frontal encephalomalacia.    Transthoracic echocardiogram 06/28/2017 Study Conclusions - Left ventricle: The cavity size was normal. There was mild   concentric hypertrophy. Systolic function was normal. The    estimated ejection fraction was in the range of 50% to 55%.   Hypokinesis of the apical inferior and akinesis of the basal to   mid inferior myocardium. Doppler parameters are consistent with   indeterminate ventricular filling pressure. - Aortic valve: Valve mobility was restricted. There was mild   stenosis. There was mild regurgitation. Peak velocity (S): 300   cm/s. Mean gradient (S): 19 mm Hg. Valve area (VTI): 1.64 cm^2.   Valve area (Vmax): 1.49 cm^2. Valve area (Vmean): 1.39 cm^2.   Regurgitation pressure half-time: 723 ms. - Mitral valve: Transvalvular velocity was within the normal range.   There was no evidence for stenosis. There was no regurgitation. - Left atrium: The atrium was severely dilated. - Right ventricle: The cavity size was normal. Wall thickness was   normal. Systolic function was normal. - Tricuspid valve: There was mild regurgitation. - Pulmonary arteries: Systolic pressure was within the normal   range. PA peak pressure: 27 mm Hg (S)  Ct Head Wo Contrast 07/04/2017 IMPRESSION: 1. Unchanged extent of cytotoxic edema within the posterior left MCA territory and left basal ganglia with mass effect on the left lateral ventricle but no midline shift or hydrocephalus. 2. Decreased number of foci petechial hemorrhage. No hemorrhagic  conversion/parenchymal hematoma.   Dg Chest Port 1 View 07/04/2017 IMPRESSION: 1. Retrocardiac opacity presumably on the basis of a left effusion and compressive atelectasis. A pneumonia is not entirely excluded. 2. Mild unchanged vascular congestion is again seen with cardiomegaly and post CABG change.   LE venous doppler - There is no obvious evidence of deep or superficial vein thrombosis involving the right and left lower extremities. All clearly visualized vessels appear patent and compressible. There is no evidence of Baker's cysts bilaterally.  Dg Chest Port 1 View 07/05/2017 IMPRESSION: No change in left basilar changes.    PHYSICAL EXAM  Temp:  [98.5 F (36.9 C)-100.2 F (37.9 C)] 99 F (37.2 C) (07/25 1100) Pulse Rate:  [81-112] 95 (07/25 0403) Resp:  [17-23] 23 (07/25 0403) BP: (112-119)/(58-69) 114/67 (07/25 0403) SpO2:  [97 %-99 %] 97 % (07/25 0403) Weight:  [190 lb 4.1 oz (86.3 kg)] 190 lb 4.1 oz (86.3 kg) (07/25 0418)  General - frail elderly Caucasian male Ophthalmologic - Fundi not visualized due to noncooperation.  Cardiovascular -  irregularly irregular heart rate and rhythm with RVR.  Extremities - right groin wound dress dry and clean.   Neuro -   awake alert,on nasal canula ,   eyes open, not following commands and not speaking , eye able to attend both sides, blinking to visual threat on the left but not inconsistent on the right. PERRL. Right facial droop, tongue midline in mouth. Spontaneous movement of LUE and LLE, at least 3/5 LUE and 2+/5 LLE. RUE and RLE mild withdraw on pain stimulation. DTR 1+ and right positive babinski. Sensation, coordination and gait not tested.    ASSESSMENT/PLAN Mr. BRASEN BUNDREN is a 83 y.o. male with history of prostate cancer, CAD, history of GI bleed, DM2, essential tremor, hypertension, history of atrial fibrillation and subdural hematoma, s/p craniectomy and clot evacuation who presented with right facial droop, dense right  hemiplegia global aphasia, and left gaze deviation. He recevied tPA at 1507 on 06/26/2017.  CTA Occluded left M1 -> IR thrombectomy TICI 3 reperfusion with Dr. Estanislado Pandy.  Stroke: left MCA infarct due to left M1 occlusion s/p thrombectomy due to afib not on AC  Resultant  aphasia and right hemiplegia  CT head:  Focal hyperdensity in left distal M1 may represent thrombus. Small left frontal subdural hematoma is stable in size and attenuation from 2014.   MRI head: left MCA infarct. Petechial blood product deposition without focal hematoma.  CT Angio Head: Emergent large vessel occlusion of the left M1 segment. Dense calcifications at the carotid bifurcations bilaterally without significant stenoses  2D Echo - EF 50-55%. Left atrium severely dilated. No cardiac source of emboli identified.  LDL 78  HgbA1c 6.8  SCDs for VTE prophylaxis Diet NPO time specified  No antithrombotic prior to admission, now on ASA 81mg . Pt deemed not candidate for anticoagulation per cardiology.  Patient counseled to be compliant with his antithrombotic medications  Ongoing aggressive stroke risk factor management  Therapy recommendations:  CIR  Disposition:   Transferred to stepdown  Right femoral pseudoaneurysm with massive thigh hematoma s/p IR due to PFA tear   VVS on board, appreciate recs  requiring surgical suturing and clot evacuation  Dress dry and clean  JP drain removed today  Afib not on [facility]  Not on AC at baseline due to previous SDH s/p crani and evacuation  Pt deemed not anticoagulation candidate per cardiology  On metoprolol for RVR  Rate currently 90-110  Cardiology on board and recs appreciated  Respiratory distress  CCM on board  Off BiPAP  LE venous doppler no DVT  PE unlikely at this time  Chest PT, NT suctioning and nebulization per CCM   AKI  Cre 0.90->1.99->2.93  Etiology not clear, could be due to dehydration, sepsis  On IVF and free water  BMP  monitoring  Foley catheter inserted 07/07/17  Hypernatremia  Na 146->150->148  Continue 1/2NS and free water  BMP monitoring  Leukocytosis   WBC 11.1->9.3->7.4->13.1 -> 11.9->13.2->9.9->14.9  Suspicious for aspiration  On Fortaz  CXR stable     Hyperlipidemia  Home meds: pravastatin 40mg  PO daily  LDL 78, goal < 70  Statin resumed  Continue statin at discharge  Diabetes  HgbA1c 6.8, goal < 7.0  Controlled  Hyperglycemia improved  Resume home medications  SSI  Anemia, improved  Due to acute blood loss  Hb 9.0->8.2->7.8->7.8->8.7->8.7->9.3->9.0  Close monitoring  PRBC transfusion if needed  Other Stroke Risk Factors  Advanced age  Overweight, Body mass index is 28.1 kg/m., recommend weight  loss, diet and exercise as appropriate   Family hx stroke (Mother)  CAD  Other Active Problems  Thrombocytopenia, resolved  hypokalemia, resolved  GI bleed history  Acute renal failure-new onset likely obstructive due to bladder outlet obstruction now resolved   Hospital day # 14 Plan   renal function now much improved. Patient is still not able to swallow. Rehabilitation team feels patient may be more appropriate for skilled nursing facility.   D/w with his   RN and HPOAat bedside and answered questions.  Patient's daughter is arriving today and will discuss goals of care and PEG tube with her This patient is critically ill due to large left MCA infarct s/p IR, right groin hematoma s/p evacuation, afib RVR, anemia, respiratory failure and at significant risk of neurological worsening, death form recurrent stroke, hemorrhagic transformation, shock, heart failure, aspiration pneumonia. This patient's care requires constant monitoring of vital signs, hemodynamics, respiratory and cardiac monitoring, review of multiple databases, neurological assessment, discussion with family, other specialists and medical decision making of high complexity. I spent 30  minutes of neurocritical care time in the care of this patient.   Antony Contras, MD   Stroke Neurology 07/10/2017 1:14 PM   To contact Stroke Continuity provider, please refer to [email or web address] After hours, contact General Neurology

## 2017-07-10 NOTE — Progress Notes (Signed)
Physical Therapy Treatment Patient Details Name: Robert Keith MRN: [id number] DOB: 10-12-1933 Today's Date: 07/10/2017    History of Present Illness Pt is an 83 y.o. male who presented to the ED with global aphasia, Rt hemiplegia, and L gaze deviation. Pt with L M1 occlusion s/p tPA and mechanical thrombectomy with revascularization.  He was intubated 7/11-7/16. Pt had significant Rt thigh hematoma with Rt femoral pseudoaneurysm following sheath removal and underwent Rt femoral artery repair on 7/12. Pt transferred back to ICU on 07/04/17. PMHx: prostate cancer, CAD, history of GI bleed, DM2, essential tremor, hypertension, history of atrial fibrillation and subdural hematoma s/p craniectomy and clot evacuation.    PT Comments    Pt tolerated Tx well with no pain but limited by fatigue and aphasia. Pt still tracking to L side and presents with a 5-10 sec delay with initiation. Pt sat EOB for approximately 30 mins, approximation on the RUE, BLE supported on floor. Pt was reaching outside BOS with multiple one/two step cues. Educated caregiver on session, the importance of sitting on pt's R side to train his attention to R side and decrease L tracking. Pt did much better since last session, will follow up with Rehab Admissions Coordinator.    Follow Up Recommendations  CIR     Equipment Recommendations  Wheelchair (measurements PT);Wheelchair cushion (measurements PT);Other (comment)    Recommendations for Other Services Rehab consult     Precautions / Restrictions Precautions Precautions: Fall Restrictions Weight Bearing Restrictions: No    Mobility  Bed Mobility Overal bed mobility: Needs Assistance Bed Mobility: Rolling;Sidelying to Sit;Sit to Supine Rolling: Max assist Sidelying to sit: Max assist;+2 for physical assistance   Sit to supine: +2 for physical assistance;Max assist   General bed mobility comments: Multlple VC's to initiate hand placement on rail/ bend knees/ sit up.  PTA manually initaited movement for sitting.   Transfers                    Ambulation/Gait                 Stairs            Wheelchair Mobility    Modified Rankin (Stroke Patients Only)       Balance Overall balance assessment: Needs assistance Sitting-balance support: Feet supported;Single extremity supported Sitting balance-Leahy Scale: Poor Sitting balance - Comments: Pt sat EOB for approximately 30 mins with BUE/BLE supported with min assist/min guard. Pt was able to reach outside BOS with LUE to target on left/right side. Pt was unable to decipher and grab the toothbrush PTA was holding, but showed inititation of movement.  Postural control: Posterior lean                                  Cognition Arousal/Alertness: Lethargic Behavior During Therapy: Flat affect Overall Cognitive Status: Difficult to assess Area of Impairment: Attention;Following commands                   Current Attention Level: Divided   Following Commands: Follows one step commands inconsistently       General Comments: Pt having trouble following verbal and tactile commands despite increase time and one/two word instructions. L side gaze      Exercises      General Comments        Pertinent Vitals/Pain Pain Assessment: Faces Faces Pain Scale: No hurt  Home Living                      Prior Function            PT Goals (current goals can now be found in the care plan section) Acute Rehab PT Goals Patient Stated Goal: unable to state PT Goal Formulation: Patient unable to participate in goal setting Progress towards PT goals: Progressing toward goals    Frequency    Min 3X/week      PT Plan Discharge plan needs to be updated    Co-evaluation              AM-PAC PT "6 Clicks" Daily Activity  Outcome Measure  Difficulty turning over in bed (including adjusting bedclothes, sheets and blankets)?:  Total Difficulty moving from lying on back to sitting on the side of the bed? : Total Difficulty sitting down on and standing up from a chair with arms (e.g., wheelchair, bedside commode, etc,.)?: Total Help needed moving to and from a bed to chair (including a wheelchair)?: Total Help needed walking in hospital room?: Total Help needed climbing 3-5 steps with a railing? : Total 6 Click Score: 6    End of Session   Activity Tolerance: Patient limited by lethargy Patient left: in bed;with call bell/phone within reach;with family/visitor present Nurse Communication: Mobility status PT Visit Diagnosis: Muscle weakness (generalized) (M62.81);Hemiplegia and hemiparesis;Other abnormalities of gait and mobility (R26.89) Hemiplegia - Right/Left: Right Hemiplegia - dominant/non-dominant: Dominant Hemiplegia - caused by: Cerebral infarction     Time: 1010-1042 PT Time Calculation (min) (ACUTE ONLY): 32 min  Charges:  $Therapeutic Activity: 23-37 mins                    G Codes:       Mouna Yager, SPTA [phone]   Amariyah Bazar 07/10/2017, 1:26 PM

## 2017-07-10 NOTE — Progress Notes (Signed)
Progress Note  SUBJECTIVE:    Alert this am. Makes eye contact. Smiling.   OBJECTIVE:   Vitals:   07/10/17 0403 07/10/17 0700  BP: 114/67   Pulse: 95   Resp: (!) 23   Temp: 99 F (37.2 C) 98.7 F (37.1 C)    Intake/Output Summary (Last 24 hours) at 07/10/17 1056 Last data filed at 07/10/17 0700  Gross per 24 hour  Intake             2550 ml  Output             3875 ml  Net            -1325 ml   Right groin with small opening medial to incision. Fibrinous exudate, but no purulence.   ASSESSMENT/PLAN:   83 y.o. male is s/p: right groin exploration and evacuation of thigh hematoma 13 Days Post-Op   Much more alert today.  Continue dry dressings to right groin.   Robert Keith 07/10/2017 10:56 AM -- LABS:   CBC    Component Value Date/Time   WBC 11.1 (H) 07/07/2017 0409   HGB 8.1 (L) 07/07/2017 0409   HGB 15.2 12/22/2014 1430   HCT 26.6 (L) 07/07/2017 0409   HCT 46.1 12/22/2014 1430   PLT 242 07/07/2017 0409   PLT 194 12/22/2014 1430    BMET    Component Value Date/Time   NA 150 (H) 07/08/2017 1403   NA 142 12/22/2014 1430   K 4.4 07/08/2017 1403   K 4.3 12/22/2014 1430   CL 120 (H) 07/08/2017 1403   CL 108 (H) 12/22/2014 1430   CO2 22 07/08/2017 1403   CO2 27 12/22/2014 1430   GLUCOSE 219 (H) 07/08/2017 1403   GLUCOSE 119 (H) 12/22/2014 1430   BUN 48 (H) 07/08/2017 1403   BUN 20 (H) 12/22/2014 1430   CREATININE 1.32 (H) 07/08/2017 1403   CREATININE 1.13 12/22/2014 1430   CALCIUM 7.8 (L) 07/08/2017 1403   CALCIUM 8.8 12/22/2014 1430   GFRNONAA 49 (L) 07/08/2017 1403   GFRNONAA >60 12/22/2014 1430   GFRAA 56 (L) 07/08/2017 1403   GFRAA >60 12/22/2014 1430    COAG Lab Results  Component Value Date   INR 1.59 06/27/2017   INR 1.67 06/27/2017   INR 1.13 06/26/2017   No results found for: PTT  ANTIBIOTICS:   Anti-infectives    Start     Dose/Rate Route Frequency Ordered Stop   07/07/17 0859  cefTAZidime (FORTAZ) 1 g in dextrose 5 %  50 mL IVPB  Status:  Discontinued     1 g 100 mL/hr over 30 Minutes Intravenous Every 24 hours 07/06/17 1547 07/08/17 1622   06/30/17 1300  vancomycin (VANCOCIN) 1,250 mg in sodium chloride 0.9 % 250 mL IVPB  Status:  Discontinued     1,250 mg 166.7 mL/hr over 90 Minutes Intravenous Every 12 hours 06/29/17 2351 07/02/17 0907   06/30/17 0030  cefTAZidime (FORTAZ) 1 g in dextrose 5 % 50 mL IVPB  Status:  Discontinued     1 g 100 mL/hr over 30 Minutes Intravenous Every 8 hours 06/29/17 2350 07/06/17 1547   06/30/17 0000  vancomycin (VANCOCIN) 2,000 mg in sodium chloride 0.9 % 500 mL IVPB     2,000 mg 250 mL/hr over 120 Minutes Intravenous  Once 06/29/17 2350 06/30/17 0336   06/26/17 1627  ceFAZolin (ANCEF) 2-4 GM/100ML-% IVPB    Comments:  Desiree Hane   : cabinet override  06/26/17 1627 06/27/17 [place], PA-C [facility] Office: [phone] Pager: [phone] 07/10/2017 10:56 AM

## 2017-07-10 NOTE — Progress Notes (Signed)
Modified Barium Swallow Progress Note  Patient Details  Name: Robert Keith MRN: [id number] Date of Birth: 23-Dec-1932  Today's Date: 07/10/2017  Modified Barium Swallow completed.  Full report located under Chart Review in the Imaging Section.  Brief recommendations include the following:  Clinical Impression  Pt presents with a severe oral, moderate pharyngeal dysphagia marked by significant oral holding, anterior spillage, poor bolus cohesion and propulsion into pharynx.  Pt required max verbal/tactile cues to initiate oral movement.  Frequent suctioning required throughout assessment.  Once materials reached the pharynx, a delayed swallow was eventually triggered, leaving behind moderate diffuse residue throughout the pharynx.  Penetration into the larynx elicited mild throat-clearing.  The study was discontinued due to patient fatigue and increasing difficulty attending and following instuctions.  Oral suctioning provided again to remove copious barium from oral cavity.  Pt is not ready for an oral diet.  Recommend initiation of therapeutic POs with SLP only during dysphagia treatment.  Continue TF pending family discussion with MD re: long-term plans for nutrition.     Swallow Evaluation Recommendations       SLP Diet Recommendations: NPO       Medication Administration: Via alternative means               Oral Care Recommendations: Oral care QID   Other Recommendations: Have oral suction available    Juan Quam Laurice 07/10/2017,1:51 PM

## 2017-07-11 LAB — CBC
HCT: 31.5 % — ABNORMAL LOW (ref 39.0–52.0)
HEMOGLOBIN: 9.6 g/dL — AB (ref 13.0–17.0)
MCH: 30.1 pg (ref 26.0–34.0)
MCHC: 30.5 g/dL (ref 30.0–36.0)
MCV: 98.7 fL (ref 78.0–100.0)
Platelets: 309 10*3/uL (ref 150–400)
RBC: 3.19 MIL/uL — AB (ref 4.22–5.81)
RDW: 18.7 % — AB (ref 11.5–15.5)
WBC: 10.7 10*3/uL — ABNORMAL HIGH (ref 4.0–10.5)

## 2017-07-11 LAB — BASIC METABOLIC PANEL
ANION GAP: 7 (ref 5–15)
BUN: 33 mg/dL — AB (ref 6–20)
CALCIUM: 8 mg/dL — AB (ref 8.9–10.3)
CO2: 21 mmol/L — AB (ref 22–32)
Chloride: 114 mmol/L — ABNORMAL HIGH (ref 101–111)
Creatinine, Ser: 0.86 mg/dL (ref 0.61–1.24)
GFR calc Af Amer: >60 mL/min (ref >60)
GFR calc non Af Amer: >60 mL/min (ref >60)
GLUCOSE: 194 mg/dL — AB (ref 65–99)
POTASSIUM: 4.1 mmol/L (ref 3.5–5.1)
Sodium: 142 mmol/L (ref 135–145)

## 2017-07-11 LAB — GLUCOSE, CAPILLARY
GLUCOSE-CAPILLARY: 178 mg/dL — AB (ref 65–99)
GLUCOSE-CAPILLARY: 191 mg/dL — AB (ref 65–99)
Glucose-Capillary: 176 mg/dL — ABNORMAL HIGH (ref 65–99)
Glucose-Capillary: 149 mg/dL — ABNORMAL HIGH (ref 65–99)
Glucose-Capillary: 158 mg/dL — ABNORMAL HIGH (ref 65–99)
Glucose-Capillary: 176 mg/dL — ABNORMAL HIGH (ref 65–99)

## 2017-07-11 NOTE — Progress Notes (Signed)
SLP Cancellation Note  Patient Details Name: Robert Keith MRN: [id number] DOB: 03/30/1933   Cancelled treatment:       Reason Eval/Treat Not Completed: Patient at procedure or test/unavailable. Physical therapy working with pt. Will reattempt as time allows.   Kern Reap, [place], CCC-SLP 07/11/2017, 8:37 AM [phone]

## 2017-07-11 NOTE — Progress Notes (Signed)
Rehab admissions - I met with patient's daughter and caregiver at the bedside.  Patient needs to be able to demonstrate that he can tolerate more intense therapies.  He needs to be transferring to a chair and able to tolerate up in chair 1-2 hours at a time.  Then I can approach insurance carrier about potential acute inpatient rehab admission.  I will continue to follow progress for now.  Call me for questions.  #[phone]

## 2017-07-11 NOTE — Progress Notes (Signed)
SLP Cancellation Note  Patient Details Name: Robert Keith MRN: [id number] DOB: [birth year]   Cancelled treatment:       Reason Eval/Treat Not Completed: Fatigue/lethargy limiting ability to participate. Pt napping; RN requested allowing pt to rest. Will f/u next date.   Kern Reap, [place], CCC-SLP 07/11/2017, 3:33 PM [phone]

## 2017-07-11 NOTE — Consult Note (Signed)
[facility] Surgery Consult Note  ABSHIR PAOLINI [birth year]  [id number].    Requesting MD: Leonie Man, MD Chief Complaint/Reason for Consult: PEG tube placement  HPI:  Mr. Fuchs is an 83 year-old male with MMP including atrial fibrillation (not anticoagulated) who presented with right facial droop, right hemiplegia, and left gaze on 06/26/17. He received tPA. Work-up significant for left MCA infarct 2/2 left M1 occlusion. The patient has been slowly progressing with therapies but suffers from dysphagia, aphasia, and right hemiplegia. Trauma has been consulted for placement of PEG tube. Patient currently tolerating tube feeds via cortrak. He is on ASA 81 mg daily. Past surgeries include CABG and ,this hospital admission, right groin exploration for active bleeding from femoral artery s/p R groin cannulation. Patients daughter and caregiver are at bedside. PEG procedure explained and questions answered, they agree to proceed with PEG placement.  ROS: Review of Systems  Constitutional: Negative for chills and fever.  Respiratory: Negative for shortness of breath.   Cardiovascular: Negative for chest pain.  Gastrointestinal: Negative for abdominal pain.  Neurological: Positive for sensory change, speech change and focal weakness. Negative for headaches.  All other systems reviewed and are negative.   Family History  Problem Relation Age of Onset  . Stroke Mother   . Heart attack Father     Past Medical History:  Diagnosis Date  . Anemia   . Cancer [facility])    prostate  . Chronic atrial fibrillation ([place])   . Coronary artery disease    a. s/p CABG [facility] 1996.  . Diabetes mellitus without complication ([place])   . Essential tremor   . GI bleed 09/2016  . H. pylori infection   . H/O craniotomy   . Hypertension   . Intracranial hemorrhage ([place])   . Prostate cancer ([place])   . RBBB   . SDH (subdural hematoma) ([facility])     Past Surgical History:  Procedure Laterality Date  . brain surgery for  bleed    . cardiac bypass    . CORONARY ARTERY BYPASS GRAFT    . ESOPHAGOGASTRODUODENOSCOPY (EGD) WITH PROPOFOL N/A 09/18/2016   Procedure: ESOPHAGOGASTRODUODENOSCOPY (EGD) WITH PROPOFOL;  Surgeon: [name], MD;  Location: [place] [facility];  Service: Endoscopy;  Laterality: N/A;  . FEMORAL ARTERY EXPLORATION Right 06/27/2017   Procedure: FEMORAL ARTERY EXPLORATION;  Surgeon: Conrad Sumner, MD;  Location: [place];  Service: Vascular;  Laterality: Right;  . IR PERCUTANEOUS ART THROMBECTOMY/INFUSION INTRACRANIAL INC DIAG ANGIO  06/26/2017  . RADIOLOGY WITH ANESTHESIA N/A 06/26/2017   Procedure: RADIOLOGY WITH ANESTHESIA CODE STROKE;  Surgeon: [name], MD;  Location: [place];  Service: Radiology;  Laterality: N/A;    Social History:  reports that he has quit smoking. He has never used smokeless tobacco. He reports that he does not drink alcohol or use drugs.  Allergies:  Allergies  Allergen Reactions  . Metformin And Related Diarrhea  . Tape Other (See Comments)    PLEASE USE COBAN WRAP; PATIENT'S SKIN IS THIN AND WILL TEAR EASILY!!  . Bupropion Other (See Comments)    Unknown  . Fenofibrate Micronized Other (See Comments)    Unknown  . Gemfibrozil Other (See Comments)    Unknown  . Niacin Other (See Comments)    Impotence   . Phenobarbital Other (See Comments)    Confusion   . Simvastatin Other (See Comments)    Myalgia    Medications Prior to Admission  Medication Sig Dispense Refill  . amLODipine (NORVASC) 5 MG  tablet Take 2.5 mg by mouth 2 (two) times daily.   3  . clotrimazole (LOTRIMIN) 1 % cream Apply 1 application topically 2 (two) times daily.   1  . furosemide (LASIX) 20 MG tablet Take 1 tablet (20 mg total) by mouth daily as needed. (Patient taking differently: Take 20 mg by mouth daily as needed for fluid or edema. ) 30 tablet 6  . glipiZIDE (GLUCOTROL) 10 MG tablet Take 10 mg by mouth 2 (two) times daily.  3  . KLOR-CON M20 20 MEQ tablet TAKE 1 TABLET (20  MEQ TOTAL) BY MOUTH DAILY AS NEEDED. (Patient taking differently: Take 20 mEq by mouth daily as needed (for potassium). ) 30 tablet 3  . loratadine (CLARITIN) 10 MG tablet Take 10 mg by mouth daily.    Marland Kitchen MEGARED OMEGA-3 KRILL OIL PO Take 1 capsule by mouth daily.    . metoprolol (LOPRESSOR) 100 MG tablet Take 100 mg by mouth 2 (two) times daily.     . pantoprazole (PROTONIX) 40 MG tablet Take 1 tablet (40 mg total) by mouth 2 (two) times daily before a meal. (Patient taking differently: Take 40 mg by mouth daily. ) 120 tablet 0  . pioglitazone (ACTOS) 15 MG tablet Take 15 mg by mouth daily.    . pravastatin (PRAVACHOL) 40 MG tablet Take 40 mg by mouth at bedtime.     . propranolol (INDERAL) 40 MG tablet Take 40 mg by mouth 2 (two) times daily.  2    Blood pressure 112/61, pulse 81, temperature 99.1 F (37.3 C), temperature source Axillary, resp. rate 20, height 5\' 9"  (1.753 m), weight 85.4 kg (188 lb 4.4 oz), SpO2 100 %. Physical Exam: Physical Exam  Constitutional: He appears well-developed and well-nourished. No distress.  HENT:  Head: Normocephalic and atraumatic.  Right Ear: External ear normal.  Left Ear: External ear normal.  Nose: Nose normal.  Eyes: EOM are normal. Right eye exhibits no discharge. Left eye exhibits no discharge. No scleral icterus.  Neck: Normal range of motion. Neck supple. No tracheal deviation present.  Cardiovascular: Normal rate and intact distal pulses.  Exam reveals no gallop and no friction rub.   No murmur heard. Pulmonary/Chest: Effort normal and breath sounds normal. No stridor. No respiratory distress. He has no wheezes. He has no rales. He exhibits no tenderness.  Abdominal: Soft. Bowel sounds are normal. He exhibits no distension and no mass. There is no tenderness. There is no rebound and no guarding. No hernia.  Musculoskeletal: He exhibits no tenderness or deformity.  Neurological: He is alert. A sensory deficit is present.  Skin: Skin is warm and  dry. Rash noted. He is not diaphoretic.    Results for orders placed or performed during the hospital encounter of 06/26/17 (from the past 48 hour(s))  Glucose, capillary     Status: Abnormal   Collection Time: 07/09/17 12:22 PM  Result Value Ref Range   Glucose-Capillary 204 (H) 65 - 99 mg/dL  Glucose, capillary     Status: Abnormal   Collection Time: 07/09/17  3:33 PM  Result Value Ref Range   Glucose-Capillary 187 (H) 65 - 99 mg/dL  Glucose, capillary     Status: Abnormal   Collection Time: 07/09/17  7:28 PM  Result Value Ref Range   Glucose-Capillary 183 (H) 65 - 99 mg/dL  Glucose, capillary     Status: Abnormal   Collection Time: 07/09/17 11:07 PM  Result Value Ref Range   Glucose-Capillary 184 (H) 65 -  99 mg/dL  Glucose, capillary     Status: Abnormal   Collection Time: 07/10/17  4:04 AM  Result Value Ref Range   Glucose-Capillary 166 (H) 65 - 99 mg/dL  Glucose, capillary     Status: Abnormal   Collection Time: 07/10/17  8:03 AM  Result Value Ref Range   Glucose-Capillary 189 (H) 65 - 99 mg/dL   Comment 1 Notify RN    Comment 2 Document in Chart   Glucose, capillary     Status: Abnormal   Collection Time: 07/10/17 11:52 AM  Result Value Ref Range   Glucose-Capillary 170 (H) 65 - 99 mg/dL   Comment 1 Notify RN    Comment 2 Document in Chart   Glucose, capillary     Status: Abnormal   Collection Time: 07/10/17  3:25 PM  Result Value Ref Range   Glucose-Capillary 207 (H) 65 - 99 mg/dL  Glucose, capillary     Status: Abnormal   Collection Time: 07/10/17  7:23 PM  Result Value Ref Range   Glucose-Capillary 176 (H) 65 - 99 mg/dL  Glucose, capillary     Status: Abnormal   Collection Time: 07/10/17 11:19 PM  Result Value Ref Range   Glucose-Capillary 157 (H) 65 - 99 mg/dL  Glucose, capillary     Status: Abnormal   Collection Time: 07/11/17  4:06 AM  Result Value Ref Range   Glucose-Capillary 178 (H) 65 - 99 mg/dL  Glucose, capillary     Status: Abnormal   Collection  Time: 07/11/17  8:09 AM  Result Value Ref Range   Glucose-Capillary 176 (H) 65 - 99 mg/dL  CBC     Status: Abnormal   Collection Time: 07/11/17  9:42 AM  Result Value Ref Range   WBC 10.7 (H) 4.0 - 10.5 K/uL   RBC 3.19 (L) 4.22 - 5.81 MIL/uL   Hemoglobin 9.6 (L) 13.0 - 17.0 g/dL   HCT 31.5 (L) 39.0 - 52.0 %   MCV 98.7 78.0 - 100.0 fL   MCH 30.1 26.0 - 34.0 pg   MCHC 30.5 30.0 - 36.0 g/dL   RDW 18.7 (H) 11.5 - 15.5 %   Platelets 309 150 - 400 K/uL   Dg Swallowing Func-speech Pathology  Result Date: 07/10/2017 Objective Swallowing Evaluation: Type of Study: MBS-Modified Barium Swallow Study Patient Details Name: IVERSON SEES MRN: [id number] Date of Birth: [birth year] Today's Date: 07/10/2017 Time: SLP Start Time (ACUTE ONLY): 1256-SLP Stop Time (ACUTE ONLY): 1326 SLP Time Calculation (min) (ACUTE ONLY): 30 min Past Medical History: Past Medical History: Diagnosis Date . Anemia  . Cancer [facility])   prostate . Chronic atrial fibrillation ([place])  . Coronary artery disease   a. s/p CABG [facility] 1996. . Diabetes mellitus without complication ([place])  . Essential tremor  . GI bleed 09/2016 . H. pylori infection  . H/O craniotomy  . Hypertension  . Intracranial hemorrhage ([place])  . Prostate cancer ([place])  . RBBB  . SDH (subdural hematoma) ([facility])  Past Surgical History: Past Surgical History: Procedure Laterality Date . brain surgery for bleed   . cardiac bypass   . CORONARY ARTERY BYPASS GRAFT   . ESOPHAGOGASTRODUODENOSCOPY (EGD) WITH PROPOFOL N/A 09/18/2016  Procedure: ESOPHAGOGASTRODUODENOSCOPY (EGD) WITH PROPOFOL;  Surgeon: [name], MD;  Location: [place] [facility];  Service: Endoscopy;  Laterality: N/A; . FEMORAL ARTERY EXPLORATION Right 06/27/2017  Procedure: FEMORAL ARTERY EXPLORATION;  Surgeon: Conrad Paulsboro, MD;  Location: [place];  Service: Vascular;  Laterality: Right; . IR PERCUTANEOUS ART  THROMBECTOMY/INFUSION INTRACRANIAL INC DIAG ANGIO  06/26/2017 . RADIOLOGY WITH ANESTHESIA N/A 06/26/2017  Procedure:  RADIOLOGY WITH ANESTHESIA CODE STROKE;  Surgeon: [name], MD;  Location: [place];  Service: Radiology;  Laterality: N/A; HPI: Pt is an 83 y.o.malewith history of prostate cancer, CAD, history of GI bleed, DM2, essential tremor, hypertension, history of atrial fibrillation and subdural hematoma s/p craniectomy and clot evacuation,who presented with right facial droop, dense right hemiplegia global aphasia, and left gaze deviation. He recevied tPA and went to IR for thrombectomy. He was intubated 7/11-7/16. Pt did have a prior MBS in September 2016 with swallowing WFL.  Subjective: alert, difficulty following commands Assessment / Plan / Recommendation CHL IP CLINICAL IMPRESSIONS 07/10/2017 Clinical Impression Pt presents with a severe oral, moderate pharyngeal dysphagia marked by significant oral holding, anterior spillage, poor bolus cohesion and propulsion into pharynx.  Pt required max verbal/tactile cues to initiate oral movement.  Frequent suctioning required throughout assessment.  Once materials reached the pharynx, a delayed swallow was eventually triggered, leaving behind moderate diffuse residue throughout the pharynx.  Penetration into the larynx elicited mild throat-clearing.  The study was discontinued due to patient fatigue and increasing difficulty attending to and following instuctions.  Oral suctioning provided again to remove copious barium from oral cavity.  Pt is not ready for an oral diet.  Recommend initiation of therapeutic POs with SLP only during dysphagia treatment.  Continue TF pending family discussion with MD re: long-term plans for nutrition.   SLP Visit Diagnosis Dysphagia, oropharyngeal phase (R13.12) Attention and concentration deficit following -- Frontal lobe and executive function deficit following -- Impact on safety and function Severe aspiration risk   CHL IP TREATMENT RECOMMENDATION 07/10/2017 Treatment Recommendations Therapy as outlined in treatment plan below    Prognosis 07/10/2017 Prognosis for Safe Diet Advancement Fair Barriers to Reach Goals Language deficits Barriers/Prognosis Comment -- CHL IP DIET RECOMMENDATION 07/10/2017 SLP Diet Recommendations NPO Liquid Administration via -- Medication Administration Via alternative means Compensations -- Postural Changes --   CHL IP OTHER RECOMMENDATIONS 07/10/2017 Recommended Consults -- Oral Care Recommendations Oral care QID Other Recommendations Have oral suction available   CHL IP FOLLOW UP RECOMMENDATIONS 07/06/2017 Follow up Recommendations Inpatient Rehab   CHL IP FREQUENCY AND DURATION 07/10/2017 Speech Therapy Frequency (ACUTE ONLY) min 3x week Treatment Duration 2 weeks      CHL IP ORAL PHASE 07/10/2017 Oral Phase Impaired Oral - Pudding Teaspoon -- Oral - Pudding Cup -- Oral - Honey Teaspoon -- Oral - Honey Cup -- Oral - Nectar Teaspoon Left anterior bolus loss;Right anterior bolus loss;Weak lingual manipulation;Lingual pumping;Incomplete tongue to palate contact;Reduced posterior propulsion;Holding of bolus;Right pocketing in lateral sulci;Left pocketing in lateral sulci;Pocketing in anterior sulcus;Delayed oral transit Oral - Nectar Cup -- Oral - Nectar Straw -- Oral - Thin Teaspoon -- Oral - Thin Cup -- Oral - Thin Straw -- Oral - Puree Left anterior bolus loss;Right anterior bolus loss;Weak lingual manipulation;Lingual pumping;Incomplete tongue to palate contact;Reduced posterior propulsion;Holding of bolus;Right pocketing in lateral sulci;Left pocketing in lateral sulci;Pocketing in anterior sulcus;Delayed oral transit Oral - Mech Soft -- Oral - Regular -- Oral - Multi-Consistency -- Oral - Pill -- Oral Phase - Comment --  CHL IP PHARYNGEAL PHASE 07/10/2017 Pharyngeal Phase Impaired Pharyngeal- Pudding Teaspoon -- Pharyngeal -- Pharyngeal- Pudding Cup -- Pharyngeal -- Pharyngeal- Honey Teaspoon -- Pharyngeal -- Pharyngeal- Honey Cup -- Pharyngeal -- Pharyngeal- Nectar Teaspoon Delayed swallow  initiation-vallecula;Reduced pharyngeal peristalsis;Reduced epiglottic inversion;Reduced anterior laryngeal mobility;Reduced laryngeal elevation;Reduced tongue base retraction;Penetration/Aspiration  during swallow;Pharyngeal residue - valleculae;Pharyngeal residue - pyriform Pharyngeal Material enters airway, remains ABOVE vocal cords and not ejected out Pharyngeal- Nectar Cup -- Pharyngeal -- Pharyngeal- Nectar Straw -- Pharyngeal -- Pharyngeal- Thin Teaspoon -- Pharyngeal -- Pharyngeal- Thin Cup -- Pharyngeal -- Pharyngeal- Thin Straw -- Pharyngeal -- Pharyngeal- Puree Delayed swallow initiation-vallecula;Reduced pharyngeal peristalsis;Reduced epiglottic inversion;Reduced anterior laryngeal mobility;Reduced laryngeal elevation;Reduced tongue base retraction;Penetration/Aspiration during swallow;Pharyngeal residue - valleculae;Pharyngeal residue - pyriform Pharyngeal Material enters airway, remains ABOVE vocal cords then ejected out Pharyngeal- Mechanical Soft -- Pharyngeal -- Pharyngeal- Regular -- Pharyngeal -- Pharyngeal- Multi-consistency -- Pharyngeal -- Pharyngeal- Pill -- Pharyngeal -- Pharyngeal Comment --  No flowsheet data found. CHL IP GO 08/29/2015 Functional Assessment Tool Used MBS Functional Limitations Swallowing Swallow Current Status (W1027) CI Swallow Goal Status (O5366) CI Swallow Discharge Status (Y4034) CI Motor Speech Current Status (V4259) (None) Motor Speech Goal Status (D6387) (None) Motor Speech Goal Status (F6433) (None) Spoken Language Comprehension Current Status (I9518) (None) Spoken Language Comprehension Goal Status (A4166) (None) Spoken Language Comprehension Discharge Status [phone]) (None) Spoken Language Expression Current Status [phone]) (None) Spoken Language Expression Goal Status [phone]) (None) Spoken Language Expression Discharge Status [phone]) (None) Attention Current Status (U5427) (None) Attention Goal Status (C6237) (None) Attention Discharge Status [phone]) (None) Memory  Current Status (D1761) (None) Memory Goal Status (Y0737) (None) Memory Discharge Status (T0626) (None) Voice Current Status (R4854) (None) Voice Goal Status (O2703) (None) Voice Discharge Status [phone]) (None) Other Speech-Language Pathology Functional Limitation Current Status [phone]) (None) Other Speech-Language Pathology Functional Limitation Goal Status (H3716) (None) Other Speech-Language Pathology Functional Limitation Discharge Status [phone]) (None) Juan Quam Laurice 07/10/2017, 1:39 PM              Assessment/Plan Dysphagia, prolonged inability to tolerate PO intake 2/2 left MCA infarct  - consent for PEG ordered - hold tube feeds at midnight - plan for PEG placement in endoscopy tomorrow by Dr. [name] at 11 AM  - ok to continue ASA prior to procedure  Jill Alexanders, [facility] Surgery 07/11/2017, 10:33 AM Pager: [phone] Consults: [phone] Mon-Fri 7:00 am-4:30 pm Sat-Sun 7:00 am-11:30 am

## 2017-07-11 NOTE — Progress Notes (Signed)
Occupational Therapy Treatment Patient Details Name: Robert Keith MRN: [id number] DOB: [birth year] Today's Date: 07/11/2017    History of present illness Pt is an 83 y.o. male who presented to the ED with global aphasia, Rt hemiplegia, and L gaze deviation. Pt with L M1 occlusion s/p tPA and mechanical thrombectomy with revascularization.  He was intubated 7/11-7/16. Pt had significant Rt thigh hematoma with Rt femoral pseudoaneurysm following sheath removal and underwent Rt femoral artery repair on 7/12. Pt transferred back to ICU on 07/04/17. PMHx: prostate cancer, CAD, history of GI bleed, DM2, essential tremor, hypertension, history of atrial fibrillation and subdural hematoma s/p craniectomy and clot evacuation.   OT comments  This 83 yo male admitted with above presents to acute OT making progress with sitting balance, attention, and looking to right when cued with increased time for all. He will continue to benefit from acute OT with follow up OT on CIR.    Follow Up Recommendations  Supervision/Assistance - 24 hour;CIR    Equipment Recommendations  Other (comment) (TBD at next venue)       Precautions / Restrictions Precautions Precautions: Fall Precaution Comments: rt groin incision Restrictions Weight Bearing Restrictions: No       Mobility Bed Mobility Overal bed mobility: Needs Assistance Bed Mobility: Rolling;Sidelying to Sit;Sit to Supine Rolling: Max assist Sidelying to sit: Max assist;+2 for physical assistance   Sit to supine: +2 for physical assistance;Max assist   General bed mobility comments: Multlple VC's to initiate hand placement on rail/ bend knees/ sit up.  Pt was able to initaite movement for sitting but lacks carryover due to decr motor planning and weakness.   Transfers Overall transfer level: Needs assistance Equipment used: Ambulation equipment used Transfers: Sit to/from Stand Sit to Stand: Max assist;Mod assist;+2 physical assistance;From  elevated surface         General transfer comment: Used STedy with bed elevated to assist pt with sit to stand.  Pt needed assist by daughter to hold his right UE onto the [facility] while PT and OT were assisting with power up with use of pad and gait belt.  Pt was unable to stand fully upright keeping trunk, hips and knees flexed.  Stood x4 standing a little taller each time but with alot of assist by therapy to try and maximize extension.     Balance Overall balance assessment: Needs assistance Sitting-balance support: Feet supported;Bilateral upper extremity supported Sitting balance-Leahy Scale: Poor Sitting balance - Comments: Pt sat EOB for approximately 20 mins with BUE/BLE supported with max assist initially and progressing to min guard after PT/OT addressed trunk and balance issues.  Postural control: Posterior lean Standing balance support: Bilateral upper extremity supported Standing balance-Leahy Scale: Zero                             ADL either performed or assessed with clinical judgement   ADL Overall ADL's : Needs assistance/impaired     Grooming: Total assistance Grooming Details (indicate cue type and reason): intermittent supported sitting EOB (hand over hand A to wipe mouth with LUE)                                     Vision Baseline Vision/History: Wears glasses Wears Glasses: At all times Additional Comments: Pt with left visual gaze/head preference; can turn head slightly past midline as well as  eyes to right (encouraged caregiver and dtr in room to talk/interact with pt on his right side)          Cognition Arousal/Alertness: Lethargic Behavior During Therapy: Flat affect Overall Cognitive Status: Difficult to assess Area of Impairment: Attention;Following commands                   Current Attention Level: Divided   Following Commands: Follows one step commands inconsistently       General Comments: Pt having  trouble following verbal and tactile commands despite increase time and one/two word instructions. L side gaze.  Appears to have ~ 5-8 second delay in response to commands.                    Pertinent Vitals/ Pain       Pain Assessment: No/denies pain         Frequency  Min 3X/week        Progress Toward Goals  OT Goals(current goals can now be found in the care plan section)  Progress towards OT goals: Progressing toward goals     Plan Discharge plan remains appropriate    Co-evaluation    PT/OT/SLP Co-Evaluation/Treatment: Yes Reason for Co-Treatment: Complexity of the patient's impairments (multi-system involvement) PT goals addressed during session: Mobility/safety with mobility OT goals addressed during session: ADL's and self-care;Strengthening/ROM      AM-PAC PT "6 Clicks" Daily Activity     Outcome Measure   Help from another person eating meals?: Total Help from another person taking care of personal grooming?: Total Help from another person toileting, which includes using toliet, bedpan, or urinal?: Total Help from another person bathing (including washing, rinsing, drying)?: Total Help from another person to put on and taking off regular upper body clothing?: Total Help from another person to put on and taking off regular lower body clothing?: Total 6 Click Score: 6    End of Session Equipment Utilized During Treatment: Gait belt  OT Visit Diagnosis: Other abnormalities of gait and mobility (R26.89);Hemiplegia and hemiparesis;Cognitive communication deficit (R41.841);Unsteadiness on feet (R26.81) Symptoms and signs involving cognitive functions: Cerebral infarction Hemiplegia - Right/Left: Right Hemiplegia - caused by: Cerebral infarction   Activity Tolerance Patient tolerated treatment well   Patient Left in bed;with call bell/phone within reach (no bed alarm due to caregiver and dtr staying with him in room and they will let RN know if they  leave so as to turn bed alarm on)           Time: 8315-1761 OT Time Calculation (min): 37 min  Charges: OT General Charges $OT Visit: 1 Procedure OT Treatments $Therapeutic Activity: 8-22 mins  [address], OTR/L [phone] 07/11/2017

## 2017-07-11 NOTE — Progress Notes (Addendum)
Physical Therapy Treatment Patient Details Name: Robert Keith MRN: [id number] DOB: 13-May-1933 Today's Date: 07/11/2017    History of Present Illness Pt is an 83 y.o. male who presented to the ED with global aphasia, Rt hemiplegia, and L gaze deviation. Pt with L M1 occlusion s/p tPA and mechanical thrombectomy with revascularization.  He was intubated 7/11-7/16. Pt had significant Rt thigh hematoma with Rt femoral pseudoaneurysm following sheath removal and underwent Rt femoral artery repair on 7/12. Pt transferred back to ICU on 07/04/17. PMHx: prostate cancer, CAD, history of GI bleed, DM2, essential tremor, hypertension, history of atrial fibrillation and subdural hematoma s/p craniectomy and clot evacuation.    PT Comments    Pt admitted with above diagnosis. Pt currently with functional limitations due to balance and endurance deficits. Pt progressing with ability to stand x 4 attempts today.  Sitting balance improving daily.  Continues with left gaze preference.  Pt responds to yes no questions with 80% accuracy. VSS.   Daughter and caregiver Judeen Hammans present.   Pt will benefit from skilled PT to increase their independence and safety with mobility to allow discharge to the venue listed below.     Follow Up Recommendations  CIR     Equipment Recommendations  Wheelchair (measurements PT);Wheelchair cushion (measurements PT);Other (comment)    Recommendations for Other Services Rehab consult     Precautions / Restrictions Precautions Precautions: Fall Precaution Comments: rt groin incision Restrictions Weight Bearing Restrictions: No    Mobility  Bed Mobility Overal bed mobility: Needs Assistance Bed Mobility: Rolling;Sidelying to Sit;Sit to Supine Rolling: Max assist Sidelying to sit: Max assist;+2 for physical assistance   Sit to supine: +2 for physical assistance;Max assist   General bed mobility comments: Multlple VC's to initiate hand placement on rail/ bend knees/ sit up.   Pt was able to initaite movement for sitting but lacks carryover due to decr motor planning and weakness.   Transfers Overall transfer level: Needs assistance Equipment used: Ambulation equipment used Transfers: Sit to/from Stand Sit to Stand: Max assist;Mod assist;+2 physical assistance;From elevated surface         General transfer comment: Used STedy with bed elevated to assist pt with sit to stand.  Pt needed assist by daughter to hold his right UE onto the [facility] while PT and OT were assisting with power up with use of pad and gait belt.  Pt was unable to stand fully upright keeping trunk, hips and knees flexed.  Stood x4 standing a little taller each time but with alot of assist by therapy to try and maximize extension.   Ambulation/Gait             General Gait Details: Unable at this time   Stairs            Wheelchair Mobility    Modified Rankin (Stroke Patients Only) Modified Rankin (Stroke Patients Only) Pre-Morbid Rankin Score: No symptoms Modified Rankin: Severe disability     Balance Overall balance assessment: Needs assistance Sitting-balance support: Feet supported;Bilateral upper extremity supported Sitting balance-Leahy Scale: Poor Sitting balance - Comments: Pt sat EOB for approximately 20 mins with BUE/BLE supported with max assist initially and progressing to min guard after PT/OT addressed trunk and balance issues.  Postural control: Posterior lean Standing balance support: Bilateral upper extremity supported Standing balance-Leahy Scale: Zero Standing balance comment: Pt requires +2 max assist to stand with STedy and cannot achieve full upright stance.  Cognition Arousal/Alertness: Lethargic Behavior During Therapy: Flat affect Overall Cognitive Status: Difficult to assess Area of Impairment: Attention;Following commands                   Current Attention Level: Divided   Following Commands:  Follows one step commands inconsistently       General Comments: Pt having trouble following verbal and tactile commands despite increase time and one/two word instructions. L side gaze.  Appears to have ~ 5-8 second delay in response to commands.       Exercises      General Comments        Pertinent Vitals/Pain Pain Assessment: No/denies pain    Home Living                      Prior Function            PT Goals (current goals can now be found in the care plan section) Progress towards PT goals: Progressing toward goals    Frequency    Min 3X/week      PT Plan Current plan remains appropriate    Co-evaluation PT/OT/SLP Co-Evaluation/Treatment: Yes Reason for Co-Treatment: Complexity of the patient's impairments (multi-system involvement) PT goals addressed during session: Mobility/safety with mobility OT goals addressed during session: ADL's and self-care;Strengthening/ROM      AM-PAC PT "6 Clicks" Daily Activity  Outcome Measure  Difficulty turning over in bed (including adjusting bedclothes, sheets and blankets)?: Total Difficulty moving from lying on back to sitting on the side of the bed? : Total Difficulty sitting down on and standing up from a chair with arms (e.g., wheelchair, bedside commode, etc,.)?: Total Help needed moving to and from a bed to chair (including a wheelchair)?: Total Help needed walking in hospital room?: Total Help needed climbing 3-5 steps with a railing? : Total 6 Click Score: 6    End of Session Equipment Utilized During Treatment: Oxygen;Gait belt Activity Tolerance: Patient limited by fatigue Patient left: in bed;with call bell/phone within reach;with family/visitor present Nurse Communication: Mobility status PT Visit Diagnosis: Muscle weakness (generalized) (M62.81);Hemiplegia and hemiparesis;Other abnormalities of gait and mobility (R26.89) Hemiplegia - Right/Left: Right Hemiplegia - dominant/non-dominant:  Dominant Hemiplegia - caused by: Cerebral infarction     Time: 7711-6579 PT Time Calculation (min) (ACUTE ONLY): 36 min  Charges:  $Therapeutic Activity: 8-22 mins                    G Codes:       Fairy Ashlock,PT Acute Rehabilitation [phone] (pager)    Denice Paradise 07/11/2017, 11:07 AM

## 2017-07-11 NOTE — Progress Notes (Signed)
STROKE TEAM PROGRESS NOTE   SUBJECTIVE (INTERVAL HISTORY) His friend HPOA  And daughter are    at the bedside. Pt  Awake and more interactive today, no respiratory distress.   Pt has no acute event overnight.     .    . OBJECTIVE Temp:  [98 F (36.7 C)-99.1 F (37.3 C)] 98.6 F (37 C) (07/26 1123) Pulse Rate:  [77-87] 77 (07/26 1123) Cardiac Rhythm: Atrial fibrillation (07/26 0800) Resp:  [18-24] 23 (07/26 1123) BP: (100-113)/(49-61) 113/61 (07/26 1123) SpO2:  [96 %-100 %] 97 % (07/26 1123) Weight:  [188 lb 4.4 oz (85.4 kg)] 188 lb 4.4 oz (85.4 kg) (07/26 0403)  CBC:   Recent Labs Lab 07/07/17 0409 07/11/17 0942  WBC 11.1* 10.7*  HGB 8.1* 9.6*  HCT 26.6* 31.5*  MCV 94.0 98.7  PLT 242 009    Basic Metabolic Panel:  Recent Labs Lab 07/05/17 0235  07/08/17 1403 07/11/17 0942  NA 148*  < > 150* 142  K 4.0  < > 4.4 4.1  CL 114*  < > 120* 114*  CO2 24  < > 22 21*  GLUCOSE 165*  < > 219* 194*  BUN 73*  < > 48* 33*  CREATININE 2.93*  < > 1.32* 0.86  CALCIUM 8.0*  < > 7.8* 8.0*  MG 2.8*  --   --   --   PHOS 5.1*  --   --   --   < > = values in this interval not displayed.  Lipid Panel:     Component Value Date/Time   CHOL 115 06/27/2017 0500   TRIG 86 06/27/2017 0500   HDL 20 (L) 06/27/2017 0500   CHOLHDL 5.8 06/27/2017 0500   VLDL 17 06/27/2017 0500   LDLCALC 78 06/27/2017 0500   HgbA1c:  Lab Results  Component Value Date   HGBA1C 6.8 (H) 06/27/2017   Urine Drug Screen: No results found for: LABOPIA, COCAINSCRNUR, LABBENZ, AMPHETMU, THCU, LABBARB  Alcohol Level No results found for: [place] I have personally reviewed the radiological images below and agree with the radiology interpretations.  Ct Abdomen Pelvis Wo Contrast 06/27/2017 1. Hematoma within the proximal RIGHT anterior thigh is incompletely imaged. Hematoma likely spontaneous related to anticoagulation. Recommend clinical correlation with extent of hematoma in the RIGHT thigh.  2. No  intraperitoneal or retroperitoneal hemorrhage.  3. Extensive diverticulosis without diverticulitis.  4.  Aortic Atherosclerosis (ICD10-I70.0).  5. Small bilateral pleural effusions.    Ct Angio Head and neck W and Wo Contrast 06/26/2017 1. Emergent large vessel occlusion of the left M1 segment.  2. There is some reconstitution of anterior left MCA branch vessels from pial collaterals.  3. Dense calcifications at the carotid bifurcations bilaterally without significant stenoses relative to the more distal vessels.  4. Moderate narrowing of the proximal posterior cerebral arteries bilaterally, right greater than left.  5. Calcifications at great vessel origins along the arch without significant stenosis. 6. Hypoplastic left vertebral artery.  7. Multilevel cervical spinal spondylosis.  8. Emphysema.  9. Prior granulomatous disease in the right upper lobe.  Cerebral aneurysm and thrombectomy - Dr. Estanislado Pandy 06/26/2017 Status post endovascular complete revascularization of occluded left middle cerebral artery with 1 pass with Solitaire 4 mm x 40 mm FR retrieval device and a 6 [place] intermediary guide catheter achieving a TICI 3 reperfusion.  Mr Brain Wo Contrast 06/27/2017 Completed acute infarction affecting the majority of the inferior division left MCA distribution. This affects the left temporal  lobe and posterior frontal lobe with patchy involvement in the parietal region.  Infarction of the caudate and putamen as well.  Petechial blood product deposition without focal hematoma.  Swelling but no shift at this time.  Ct Cerebral Perfusion W Contrast 06/26/2017 Left M1 emergent large vessel occlusion with associated acute left MCA territory infarct.  Core infarct is estimated at 49 mL.  The tissue at risk is estimated at 182 mL.   Ct Head Code Stroke W/o Cm 06/26/2017 1. Focal hyperdensity in left distal M1 may represent thrombus.  2. No vascular territory infarct identified at this  time.  3. Small left frontal subdural hematoma is stable in size and attenuation from 2014.  4. ASPECTS is 10   06/29/2017 1. Evolving large LEFT MCA territory infarct (LEFT basal ganglia, LEFT temporoparietal lobes) with petechial hemorrhage, no hemorrhagic conversion.  2. Acute small volume LEFT cerebrum extra-axial blood products.  3. Chronic changes include old small LEFT frontal subdural hematoma and small LEFT frontal encephalomalacia.    Transthoracic echocardiogram 06/28/2017 Study Conclusions - Left ventricle: The cavity size was normal. There was mild   concentric hypertrophy. Systolic function was normal. The    estimated ejection fraction was in the range of 50% to 55%.   Hypokinesis of the apical inferior and akinesis of the basal to   mid inferior myocardium. Doppler parameters are consistent with   indeterminate ventricular filling pressure. - Aortic valve: Valve mobility was restricted. There was mild   stenosis. There was mild regurgitation. Peak velocity (S): 300   cm/s. Mean gradient (S): 19 mm Hg. Valve area (VTI): 1.64 cm^2.   Valve area (Vmax): 1.49 cm^2. Valve area (Vmean): 1.39 cm^2.   Regurgitation pressure half-time: 723 ms. - Mitral valve: Transvalvular velocity was within the normal range.   There was no evidence for stenosis. There was no regurgitation. - Left atrium: The atrium was severely dilated. - Right ventricle: The cavity size was normal. Wall thickness was   normal. Systolic function was normal. - Tricuspid valve: There was mild regurgitation. - Pulmonary arteries: Systolic pressure was within the normal   range. PA peak pressure: 27 mm Hg (S)  Ct Head Wo Contrast 07/04/2017 IMPRESSION: 1. Unchanged extent of cytotoxic edema within the posterior left MCA territory and left basal ganglia with mass effect on the left lateral ventricle but no midline shift or hydrocephalus. 2. Decreased number of foci petechial hemorrhage. No hemorrhagic  conversion/parenchymal hematoma.   Dg Chest Port 1 View 07/04/2017 IMPRESSION: 1. Retrocardiac opacity presumably on the basis of a left effusion and compressive atelectasis. A pneumonia is not entirely excluded. 2. Mild unchanged vascular congestion is again seen with cardiomegaly and post CABG change.   LE venous doppler - There is no obvious evidence of deep or superficial vein thrombosis involving the right and left lower extremities. All clearly visualized vessels appear patent and compressible. There is no evidence of Baker's cysts bilaterally.  Dg Chest Port 1 View 07/05/2017 IMPRESSION: No change in left basilar changes.    PHYSICAL EXAM  Temp:  [98 F (36.7 C)-99.1 F (37.3 C)] 98.6 F (37 C) (07/26 1123) Pulse Rate:  [77-87] 77 (07/26 1123) Resp:  [18-24] 23 (07/26 1123) BP: (100-113)/(49-61) 113/61 (07/26 1123) SpO2:  [96 %-100 %] 97 % (07/26 1123) Weight:  [188 lb 4.4 oz (85.4 kg)] 188 lb 4.4 oz (85.4 kg) (07/26 0403)  General - frail elderly Caucasian male Ophthalmologic - Fundi not visualized due to noncooperation.  Cardiovascular -  irregularly irregular heart rate and rhythm with RVR.  Extremities - right groin wound dress dry and clean.   Neuro -   awake alert,on nasal canula ,   eyes open, can visually track. Blinks to threat on the left but not on the right.  following only few occasional midline commands and not speaking ,  . PERRL. Right facial droop, tongue midline in mouth. Spontaneous movement of LUE and LLE, at least 3/5 LUE and 2+/5 LLE. RUE and RLE mild withdraw on pain stimulation. DTR 1+ and right positive babinski. Sensation, coordination and gait not tested.    ASSESSMENT/PLAN [name] is a 83 y.o. male with history of prostate cancer, CAD, history of GI bleed, DM2, essential tremor, hypertension, history of atrial fibrillation and subdural hematoma, s/p craniectomy and clot evacuation who presented with right facial droop, dense right  hemiplegia global aphasia, and left gaze deviation. He recevied tPA at 1507 on 06/26/2017.  CTA Occluded left M1 -> IR thrombectomy TICI 3 reperfusion with Dr. Estanislado Pandy.  Stroke: left MCA infarct due to left M1 occlusion s/p thrombectomy due to afib not on AC  Resultant  aphasia and right hemiplegia  CT head:  Focal hyperdensity in left distal M1 may represent thrombus. Small left frontal subdural hematoma is stable in size and attenuation from 2014.   MRI head: left MCA infarct. Petechial blood product deposition without focal hematoma.  CT Angio Head: Emergent large vessel occlusion of the left M1 segment. Dense calcifications at the carotid bifurcations bilaterally without significant stenoses  2D Echo - EF 50-55%. Left atrium severely dilated. No cardiac source of emboli identified.  LDL 78  HgbA1c 6.8  SCDs for VTE prophylaxis Diet NPO time specified  No antithrombotic prior to admission, now on ASA 81mg . Pt deemed not candidate for anticoagulation per cardiology.  Patient counseled to be compliant with his antithrombotic medications  Ongoing aggressive stroke risk factor management  Therapy recommendations:  CIR  Disposition:   Transferred to stepdown  Right femoral pseudoaneurysm with massive thigh hematoma s/p IR due to PFA tear   VVS on board, appreciate recs  requiring surgical suturing and clot evacuation  Dress dry and clean  JP drain removed today  Afib not on [facility]  Not on AC at baseline due to previous SDH s/p crani and evacuation  Pt deemed not anticoagulation candidate per cardiology  On metoprolol for RVR  Rate currently 90-110  Cardiology on board and recs appreciated  Respiratory distress  CCM on board  Off BiPAP  LE venous doppler no DVT  PE unlikely at this time  Chest PT, NT suctioning and nebulization per CCM   AKI  Cre 0.90->1.99->2.93  Etiology not clear, could be due to dehydration, sepsis  On IVF and free water  BMP  monitoring  Foley catheter inserted 07/07/17  Hypernatremia  Na 146->150->148  Continue 1/2NS and free water  BMP monitoring  Leukocytosis   WBC 11.1->9.3->7.4->13.1 -> 11.9->13.2->9.9->14.9  Suspicious for aspiration  On [place]  CXR stable     Hyperlipidemia  Home meds: pravastatin 40mg  PO daily  LDL 78, goal < 70  Statin resumed  Continue statin at discharge  Diabetes  HgbA1c 6.8, goal < 7.0  Controlled  Hyperglycemia improved  Resume home medications  SSI  Anemia, improved  Due to acute blood loss  Hb 9.0->8.2->7.8->7.8->8.7->8.7->9.3->9.0  Close monitoring  PRBC transfusion if needed  Other Stroke Risk Factors  Advanced age  Overweight, Body mass index is 27.8 kg/m., recommend  weight loss, diet and exercise as appropriate   Family hx stroke (Mother)  CAD  Other Active Problems  Thrombocytopenia, resolved  hypokalemia, resolved  GI bleed history  Acute renal failure-new onset likely obstructive due to bladder outlet obstruction now resolved   Hospital day # 15 Plan   renal function now normalized.. Patient is still not able to swallow and it has been nearly 2 weeks since his stroke. He will likely need a PEG tube. I have discussed with the patient's daughter at the bedside and she is agreeable. Plan to consult trauma team for PEG tube. Rehabilitation team feels patient may be more appropriate for skilled nursing facility.     This patient is critically ill due to large left MCA infarct s/p IR, right groin hematoma s/p evacuation, afib RVR, anemia, respiratory failure and at significant risk of neurological worsening, death form recurrent stroke, hemorrhagic transformation, shock, heart failure, aspiration pneumonia. This patient's care requires constant monitoring of vital signs, hemodynamics, respiratory and cardiac monitoring, review of multiple databases, neurological assessment, discussion with family, other specialists and medical  decision making of high complexity. I spent 32 minutes of neurocritical care time in the care of this patient.   Antony Contras, MD   Stroke Neurology 07/11/2017 12:00 PM   To contact Stroke Continuity provider, please refer to [email or web address] After hours, contact General Neurology

## 2017-07-12 ENCOUNTER — Encounter (HOSPITAL_COMMUNITY): Payer: Self-pay

## 2017-07-12 ENCOUNTER — Inpatient Hospital Stay (HOSPITAL_COMMUNITY): Payer: Medicare Other | Admitting: Certified Registered"

## 2017-07-12 ENCOUNTER — Encounter (HOSPITAL_COMMUNITY)
Admission: EM | Disposition: A | Discharge status: Skilled Nursing Facility | Payer: Self-pay | Source: Home / Self Care | Attending: Neurology

## 2017-07-12 HISTORY — PX: ESOPHAGOGASTRODUODENOSCOPY: SHX5428

## 2017-07-12 HISTORY — PX: PEG PLACEMENT: SHX5437

## 2017-07-12 LAB — GLUCOSE, CAPILLARY
GLUCOSE-CAPILLARY: 127 mg/dL — AB (ref 65–99)
GLUCOSE-CAPILLARY: 120 mg/dL — AB (ref 65–99)
Glucose-Capillary: 94 mg/dL (ref 65–99)
Glucose-Capillary: 127 mg/dL — ABNORMAL HIGH (ref 65–99)
Glucose-Capillary: 112 mg/dL — ABNORMAL HIGH (ref 65–99)
Glucose-Capillary: 125 mg/dL — ABNORMAL HIGH (ref 65–99)

## 2017-07-12 SURGERY — EGD (ESOPHAGOGASTRODUODENOSCOPY)
Anesthesia: Monitor Anesthesia Care

## 2017-07-12 MED ORDER — LACTATED RINGERS IV SOLN
INTRAVENOUS | Status: DC | PRN
  Administered 2017-07-12: 11:00:00 via INTRAVENOUS

## 2017-07-12 MED ORDER — PROPOFOL 10 MG/ML IV BOLUS
INTRAVENOUS | Status: DC | PRN
  Administered 2017-07-12: 10 mg via INTRAVENOUS

## 2017-07-12 MED ORDER — PROPOFOL 500 MG/50ML IV EMUL
INTRAVENOUS | Status: DC | PRN
  Administered 2017-07-12: 50 ug/kg/min via INTRAVENOUS

## 2017-07-12 MED ORDER — LACTATED RINGERS IV SOLN
INTRAVENOUS | Status: DC
  Administered 2017-07-12: 11:00:00 via INTRAVENOUS

## 2017-07-12 NOTE — NC FL2 (Signed)
[place] LEVEL OF CARE SCREENING TOOL     IDENTIFICATION  Patient Name: Robert Keith Birthdate: 13-Apr-1933 Sex: male Admission Date (Current Location): 06/26/2017  [facility] and [place] Number:  Engineering geologist and Address:  [facility] [place] [place] [address] [place], [place] [address]      Provider Number: [id number]  Attending Physician Name and Address:  Garvin Fila, MD  Relative Name and Phone Number:       Current Level of Care: Hospital Recommended Level of Care: [place] Prior Approval Number:    Date Approved/Denied:   PASRR Number: [id number]  Discharge Plan: SNF    Current Diagnoses: Patient Active Problem List   Diagnosis Date Noted  . Pleural effusion   . Leukocytosis   . Hypernatremia   . AKI (acute kidney injury) ([place])   . Acute respiratory failure ([place])   . Aphasia   . Facial droop   . Chronic subdural hematoma ([place]) 06/27/2017  . Hematoma of thigh, right, initial encounter 06/27/2017  . Cytotoxic brain edema ([place]) 06/27/2017  . Postoperative groin pseudoaneurysm ([place]) 06/27/2017  . Hemorrhagic shock ([place])   . Acute CVA (cerebrovascular accident) ([place]) 06/26/2017  . Acute ischemic stroke ([place]) 06/26/2017  . Duodenal ulcer disease   . Acute blood loss anemia 09/17/2016  . Coronary artery disease due to lipid rich plaque s/p CABG 1996 09/17/2016  . Chronic atrial fibrillation ([place]) 09/17/2016  . History of subdural hematoma 09/17/2016  . Peptic ulcer disease 09/17/2016  . Type 2 diabetes mellitus with diabetic neuropathy, without long-term current use of insulin ([place]) 09/17/2016  . Essential hypertension 09/17/2016    Orientation RESPIRATION BLADDER Height & Weight     Oriented to self  Normal (Has not used Bipap since 7/19: 10/5 at 40%.) Incontinent, Indwelling catheter Weight: 187 lb 6.3 oz (85 kg) Height:  5\' 9"  (175.3 cm)  BEHAVIORAL SYMPTOMS/MOOD NEUROLOGICAL BOWEL NUTRITION STATUS    (None)  (Acute CVA) Incontinent Feeding tube (PEG tube placed today 7/27). Cortrack still in place right now.  AMBULATORY STATUS COMMUNICATION OF NEEDS Skin   Extensive Assist Non-Verbally Bruising, Other (Comment), Surgical wounds (Blister, Skin tear.)                       Personal Care Assistance Level of Assistance  Bathing, Feeding, Dressing Bathing Assistance: Maximum assistance Feeding assistance: Maximum assistance Dressing Assistance: Maximum assistance    Functional Limitations Info  Sight, Hearing, Speech Sight Info: Adequate Hearing Info: Adequate Speech Info: Impaired    SPECIAL CARE FACTORS FREQUENCY  PT (By licensed PT), Blood pressure, OT (By licensed OT), Speech therapy     PT Frequency: 5 x week OT Frequency: 5 x week     Speech Therapy Frequency: 5 x week      Contractures Contractures Info: Not present    Additional Factors Info  Code Status, Allergies Code Status Info: Partial: ONLY use NIPPV/BiPAp only if indicated Allergies Info: Metformin and related, Tape, Bupropion, Fenofibrate Micronized, Gemfibrozil, Niacin, Phenobarbital, Simvastatin           Current Medications (07/12/2017):  This is the current hospital active medication list Current Facility-Administered Medications  Medication Dose Route Frequency Provider Last Rate Last Dose  . 0.45 % sodium chloride infusion   Intravenous Continuous Rosalin Hawking, MD 50 mL/hr at 07/12/17 0934    . 0.9 %  sodium chloride infusion  250 mL Intravenous PRN Omar Person, NP      .  acetaminophen (TYLENOL) tablet 650 mg  650 mg Oral Q4H PRN Amie Portland, MD   650 mg at 07/08/17 1735   Or  . acetaminophen (TYLENOL) solution 650 mg  650 mg Per Tube Q4H PRN Amie [place], MD   650 mg at 07/01/17 1256   Or  . acetaminophen (TYLENOL) suppository 650 mg  650 mg Rectal Q4H PRN Amie [place], MD   650 mg at 06/28/17 0331  . albuterol (PROVENTIL) (2.5 MG/3ML) 0.083% nebulizer solution 2.5 mg  2.5 mg  Nebulization Q4H PRN Garvin Fila, MD      . amLODipine (NORVASC) tablet 5 mg  5 mg Per Tube Daily Bajbus, Lauren D, RPH   5 mg at 07/12/17 0956  . aspirin chewable tablet 81 mg  81 mg Per Tube Daily Wynell Balloon, RPH   81 mg at 07/12/17 0955  . chlorhexidine (PERIDEX) 0.12 % solution 15 mL  15 mL Mouth Rinse BID Rosalin Hawking, MD   15 mL at 07/12/17 0955  . feeding supplement (OSMOLITE 1.2 CAL) liquid 1,000 mL  1,000 mL Per Tube Q24H Rigoberto Noel, MD   Stopped at 07/11/17 2353  . feeding supplement (PRO-STAT SUGAR FREE 64) liquid 30 mL  30 mL Per Tube BID Desai, Rahul P, PA-C   30 mL at 07/12/17 0955  . fentaNYL (SUBLIMAZE) injection 25-50 mcg  25-50 mcg Intravenous Q1H PRN Kerney Elbe, MD   25 mcg at 07/04/17 2107  . free water 300 mL  300 mL Per Tube Q4H Pearson Grippe B, MD   300 mL at 07/12/17 1015  . glipiZIDE (GLUCOTROL) tablet 10 mg  10 mg Per Tube BID WC Bajbus, Lauren D, RPH   10 mg at 07/12/17 0956  . insulin aspart (novoLOG) injection 0-15 Units  0-15 Units Subcutaneous Q4H Juanito Doom, MD   2 Units at 07/12/17 [phone]  . MEDLINE mouth rinse  15 mL Mouth Rinse q12n4p Rosalin Hawking, MD   15 mL at 07/11/17 1600  . metoprolol tartrate (LOPRESSOR) injection 2.5-5 mg  2.5-5 mg Intravenous Q3H PRN Rigoberto Noel, MD   5 mg at 07/04/17 0501  . metoprolol tartrate (LOPRESSOR) tablet 100 mg  100 mg Per Tube BID Bajbus, Lauren D, RPH   100 mg at 07/12/17 0955  . nystatin (MYCOSTATIN) 100000 UNIT/ML suspension 500,000 Units  5 mL Mouth/Throat QID Kerney Elbe, MD   500,000 Units at 07/12/17 0955  . ondansetron (ZOFRAN) injection 4 mg  4 mg Intravenous Q6H PRN Deveshwar, Sanjeev, MD      . pantoprazole sodium (PROTONIX) 40 mg/20 mL oral suspension 40 mg  40 mg Per Tube Daily Wynell Balloon, RPH   40 mg at 07/12/17 0955  . pioglitazone (ACTOS) tablet 15 mg  15 mg Per Tube Q breakfast Bajbus, Lauren D, RPH   15 mg at 07/12/17 0956  . pravastatin (PRAVACHOL) tablet 40 mg  40 mg Per Tube  QHS Bajbus, Lauren D, RPH   40 mg at 07/11/17 2255  . senna-docusate (Senokot-S) tablet 1 tablet  1 tablet Per Tube QHS PRN Bajbus, Almeta Monas, RPH         Discharge Medications: Please see discharge summary for a list of discharge medications.  Relevant Imaging Results:  Relevant Lab Results:   Additional Information SS#: [id number]  Candie Chroman, LCSW

## 2017-07-12 NOTE — Op Note (Signed)
[facility] Patient Name: Robert Keith Procedure Date : 07/12/2017 MRN: [id number] Attending MD: Rikki Spearing, MD Date of Birth: Apr 29, 1933 CSN: [id number] Age: 83 Admit Type: Inpatient Procedure:                Upper GI endoscopy Indications:              Place PEG, Place PEG due to dysphagia, Place PEG-J                            due to dysphagia, Place PEG-J due to impaired                            swallowing, Place PEG-J due to neurological                            disorder causing impaired swallowing Providers:                Rikki Spearing, MD, Cleda Daub, RN, Cherylynn Ridges, Technician, Phill Myron. Proofreader, CRNA Referring MD:              Medicines:                Monitored Anesthesia Care Complications:            No immediate complications. Estimated Blood Loss:     Estimated blood loss was minimal. Estimated blood                            loss was minimal. Procedure:                Pre-Anesthesia Assessment:                           - Prior to the procedure, a History and Physical                            was performed, and patient medications and                            allergies were reviewed. The patient is unable to                            give consent secondary to the patient's altered                            mental status. The risks and benefits of the                            procedure and the sedation options and risks were                            discussed with the patient's daughter. All  questions were answered and informed consent was                            obtained. Patient identification and proposed                            procedure were verified by the nurse in the                            procedure room. Mental Status Examination: alert                            and oriented. Airway Examination: normal                            oropharyngeal airway and  neck mobility. Respiratory                            Examination: clear to auscultation. CV Examination:                            normal. Prophylactic Antibiotics: The patient does                            not require prophylactic antibiotics. Prior                            Anticoagulants: The patient has taken aspirin, last                            dose was day of procedure. ASA Grade Assessment: IV                            - A patient with severe systemic disease that is a                            constant threat to life. After reviewing the risks                            and benefits, the patient was deemed in                            satisfactory condition to undergo the procedure.                            The anesthesia plan was to use monitored anesthesia                            care (MAC). Immediately prior to administration of                            medications, the patient was re-assessed for  adequacy to receive sedatives. The heart rate,                            respiratory rate, oxygen saturations, blood                            pressure, adequacy of pulmonary ventilation, and                            response to care were monitored throughout the                            procedure. The physical status of the patient was                            re-assessed after the procedure.                           After obtaining informed consent, the endoscope was                            passed under direct vision. Throughout the                            procedure, the patient's blood pressure, pulse, and                            oxygen saturations were monitored continuously. The                            EG-[id number] ([id number]) scope was introduced through the                            mouth, and advanced to the second part of duodenum.                            The upper GI endoscopy was accomplished without                             difficulty. The patient tolerated the procedure                            well. Scope In: Scope Out: Findings:      The esophagus was normal.      The stomach was normal.      The examined duodenum was normal.      The in the stomach was normal. The patient was placed in the supine       position for PEG placement. The stomach was insufflated to appose       gastric and abdominal walls. A site was located in the body of the       stomach with excellent manual external pressure for placement. The       abdominal wall was marked and prepped in a sterile manner. The area was       anesthetized with 4  mL of 1% lidocaine. The trocar needle was introduced       through the abdominal wall and into the stomach under direct endoscopic       view. A snare was introduced through the endoscope and opened in the       gastric lumen. The guide wire was passed through the trocar and into the       open snare. The snare was closed around the guide wire. The endoscope       and snare were removed, pulling the wire out through the mouth. A skin       incision was made at the site of needle insertion. The externally       removable 22 Fr Bard gastrostomy tube was lubricated. The G-tube was       tied to the guide wire and pulled through the mouth and into the       stomach. The trocar needle was removed, and the gastrostomy tube was       pulled out from the stomach through the skin. The external bumper was       attached to the gastrostomy tube, and the tube was cut to remove the       guide wire. The final position of the gastrostomy tube was confirmed by       relook endoscopy, and skin marking noted to be 4 cm at the external       bumper. The final tension and compression of the abdominal wall by the       PEG tube and external bumper were checked and revealed that the bumper       was moderately tight and mildly deforming the skin. The feeding tube was       capped, and the tube site  cleaned and dressed. Impression:               - Normal esophagus.                           - Normal stomach.                           - Normal examined duodenum.                           - No specimens collected.                           - Normal examination.                           - Normal examination. Recommendation:           - Please follow the post-PEG recommendations                            [Post-PEG Recommendations].                           - Please follow the post-PEG recommendations                            including: Nutrition consult for formula and  volume, advance food and medications per primary                            care provider and start using PEG today. Procedure Code(s):        --- Professional ---                           [id number], Esophagogastroduodenoscopy, flexible,                            transoral; with directed placement of percutaneous                            gastrostomy tube Diagnosis Code(s):        --- Professional ---                           Z43.1, Encounter for attention to gastrostomy                           R13.10, Dysphagia, unspecified CPT copyright 2016 [employer]. All rights reserved. The codes documented in this report are preliminary and upon coder review may  be revised to meet current compliance requirements. Rikki Spearing, MD [name] III, MD 07/12/2017 11:49:08 AM This report has been signed electronically. Number of Addenda: 0

## 2017-07-12 NOTE — Progress Notes (Signed)
Occupational Therapy Treatment Patient Details Name: Robert Keith MRN: [id number] DOB: 1933-12-04 Today's Date: 07/12/2017    History of present illness Pt is an 83 y.o. male who presented to the ED with global aphasia, Rt hemiplegia, and L gaze deviation. Pt with L M1 occlusion s/p tPA and mechanical thrombectomy with revascularization.  He was intubated 7/11-7/16. Pt had significant Rt thigh hematoma with Rt femoral pseudoaneurysm following sheath removal and underwent Rt femoral artery repair on 7/12. Pt transferred back to ICU on 07/04/17. PMHx: prostate cancer, CAD, history of GI bleed, DM2, essential tremor, hypertension, history of atrial fibrillation and subdural hematoma s/p craniectomy and clot evacuation.   OT comments  This 83 yo male admitted with above presents to acute OT today more alert than yesterday and with increased following commands (with delay); however sitting and standing balance were not as good as yesterday. He will continue to benefit from acute OT with follow up on CIR.  Follow Up Recommendations  Supervision/Assistance - 24 hour;CIR    Equipment Recommendations  Other (comment) (TBD next venue)       Precautions / Restrictions Precautions Precautions: Fall Precaution Comments: rt groin incision Restrictions Weight Bearing Restrictions: No       Mobility Bed Mobility Overal bed mobility: Needs Assistance Bed Mobility: Supine to Sit;Sit to Supine   Sidelying to sit: Max assist Supine to sit: +2 for physical assistance;Total assist     General bed mobility comments: Multlple VC's to initiate hand placement on rail/ move legs to side of bed/ sit up.  Pt was able to initaite movement for sitting but lacks carryover due to decr motor planning and weakness.   Transfers Overall transfer level: Needs assistance Equipment used: Ambulation equipment used Transfers: Sit to/from Stand Sit to Stand: Max assist;+2 physical assistance;From elevated surface         General transfer comment: Used Denna Haggard with bed elevated to assist pt with sit to stand.  Pt needed assist by daughter to hold his right UE onto the [facility] while OT and tech were assisting with power up with use of pad and gait belt.  Pt was unable to stand fully upright keeping trunk, hips and knees flexed as well as leaning anteriorly today.  Stood x3     Balance Overall balance assessment: Needs assistance Sitting-balance support: Feet supported;Bilateral upper extremity supported Sitting balance-Leahy Scale: Poor Sitting balance - Comments: Pt sat EOB for approximately 15 mins with BUE/BLE supported with max assist initially and progressing to min A after OT/tech addressed trunk and balance issues. (having him work on coming down on alternating forearms while sitting EOB and bringing opposite arm across body for trunk rotation)                                   ADL either performed or assessed with clinical judgement   ADL Overall ADL's : Needs assistance/impaired     Grooming: Wash/dry face Grooming Details (indicate cue type and reason): Moderate A for thoroughness, with a delay in following command he brought washcloth to face with LUE and washed about 1/4 of it                               General ADL Comments: Caregiver Enid Derry) reports that pt used his LUE to fold up newspaper (like he would do at home) that she  layed out on his bed this morning     Vision Baseline Vision/History: Wears glasses Wears Glasses: Reading only Patient Visual Report: No change from baseline Additional Comments: pt more readily turning eyes and head to right today with increased time; caregiver Enid Derry) has been sitting on his right side to encouarage him to look for in that direction          Cognition Arousal/Alertness: Awake/alert Behavior During Therapy:  (some smiles and smirks today) Overall Cognitive Status: Difficult to assess Area of Impairment:  Attention;Following commands;Safety/judgement;Problem solving                   Current Attention Level: Sustained   Following Commands: Follows one step commands inconsistently;Follows one step commands with increased time Safety/Judgement: Decreased awareness of safety;Decreased awareness of deficits   Problem Solving: Slow processing;Decreased initiation;Difficulty sequencing;Requires verbal cues;Requires tactile cues                     Pertinent Vitals/ Pain       Pain Assessment: No/denies pain         Frequency  Min 3X/week        Progress Toward Goals  OT Goals(current goals can now be found in the care plan section)  Progress towards OT goals: Progressing toward goals     Plan Discharge plan remains appropriate    Co-evaluation                 AM-PAC PT "6 Clicks" Daily Activity     Outcome Measure   Help from another person eating meals?: Total Help from another person taking care of personal grooming?: Total Help from another person toileting, which includes using toliet, bedpan, or urinal?: Total Help from another person bathing (including washing, rinsing, drying)?: Total Help from another person to put on and taking off regular upper body clothing?: Total Help from another person to put on and taking off regular lower body clothing?: Total 6 Click Score: 6    End of Session Equipment Utilized During Treatment: Gait belt  OT Visit Diagnosis: Other abnormalities of gait and mobility (R26.89);Hemiplegia and hemiparesis;Cognitive communication deficit (R41.841);Unsteadiness on feet (R26.81) Symptoms and signs involving cognitive functions: Cerebral infarction Hemiplegia - Right/Left: Right Hemiplegia - dominant/non-dominant: Dominant Hemiplegia - caused by: Cerebral infarction   Activity Tolerance Patient tolerated treatment well   Patient Left in bed;with call bell/phone within reach (caregiver and dtr in room with pt so no alarm  set, they are aware it needs to be set if they leave the room)   Nurse Communication          Time: 3151-7616 OT Time Calculation (min): 30 min  Charges: OT General Charges $OT Visit: 1 Procedure OT Treatments $Therapeutic Activity: 23-37 mins  Golden Circle, OTR/L [phone] 07/12/2017

## 2017-07-12 NOTE — Anesthesia Postprocedure Evaluation (Signed)
Anesthesia Post Note  Patient: Robert Keith  Procedure(s) Performed: Procedure(s) (LRB): ESOPHAGOGASTRODUODENOSCOPY (EGD) (N/A) PERCUTANEOUS ENDOSCOPIC GASTROSTOMY (PEG) PLACEMENT (N/A)     Patient location during evaluation: PACU Anesthesia Type: MAC Level of consciousness: awake and alert Pain management: pain level controlled Vital Signs Assessment: post-procedure vital signs reviewed and stable Respiratory status: spontaneous breathing, nonlabored ventilation, respiratory function stable and patient connected to nasal cannula oxygen Cardiovascular status: stable and blood pressure returned to baseline Anesthetic complications: no    Last Vitals:  Vitals:   07/12/17 1024 07/12/17 1200  BP: 113/77 (!) 106/58  Pulse: 76   Resp: 20 20  Temp: 36.7 C 36.6 C    Last Pain:  Vitals:   07/12/17 1200  TempSrc: Oral  PainSc:                  Cordarrell Sane

## 2017-07-12 NOTE — Anesthesia Preprocedure Evaluation (Signed)
Anesthesia Evaluation  Patient identified by MRN, date of birth, ID band Patient unresponsive  General Assessment Comment:Straight to OR ,shock , Fem artery bleed , on pressors  Airway Mallampati: Intubated       Dental no notable dental hx.    Pulmonary former smoker,    + rhonchi        Cardiovascular hypertension,  Rhythm:Irregular Rate:Tachycardia     Neuro/Psych    GI/Hepatic   Endo/Other  diabetes  Renal/GU      Musculoskeletal   Abdominal   Peds  Hematology   Anesthesia Other Findings   Reproductive/Obstetrics                             Anesthesia Physical  Anesthesia Plan  ASA: IV and emergent  Anesthesia Plan: MAC   Post-op Pain Management:    Induction:   PONV Risk Score and Plan: 2 and Ondansetron and Dexamethasone  Airway Management Planned: Nasal Cannula, Natural Airway and Simple Face Mask  Additional Equipment:   Intra-op Plan:   Post-operative Plan: Post-operative intubation/ventilation  Informed Consent: I have reviewed the patients History and Physical, chart, labs and discussed the procedure including the risks, benefits and alternatives for the proposed anesthesia with the patient or authorized representative who has indicated his/her understanding and acceptance.     Plan Discussed with:   Anesthesia Plan Comments:         Anesthesia Quick Evaluation

## 2017-07-12 NOTE — Progress Notes (Signed)
Spoke with [name] and his family member in the room this AM.  Plan for PEG at [place]. [name], MD, [place] [phone] Trauma Surgeon

## 2017-07-12 NOTE — Progress Notes (Signed)
RD brief Follow-Up Note  RD received consult for TF initiation and management.   Pt underwent PEG placement today (previously receiving TF via cortrak tube). TF orders for Osmolite 1.2 @ 60 ml/hr with 30 ml Prostat BID. Tube feeding regimen provides 1928 kcal (100% of needs), 109 gm protein, 1180 ml free water daily. Current TF orders remain appropriate and meet pt needs.   RD will continue to follow.   Chinaza Rooke A. Jimmye Norman, RD, LDN, CDE Pager: [phone] After hours Pager: [phone]

## 2017-07-12 NOTE — Progress Notes (Signed)
STROKE TEAM PROGRESS NOTE   SUBJECTIVE (INTERVAL HISTORY) His friend HPOA  and daughter are    at the bedside. Pt  Awake and more interactive today, no respiratory distress.   Pt is scheduled for peg tube today . OBJECTIVE Temp:  [97.8 F (36.6 C)-98.8 F (37.1 C)] 97.8 F (36.6 C) (07/27 1200) Pulse Rate:  [76-93] 76 (07/27 1024) Cardiac Rhythm: Atrial fibrillation (07/27 0800) Resp:  [16-21] 20 (07/27 1200) BP: (106-113)/(55-77) 106/58 (07/27 1200) SpO2:  [97 %-100 %] 97 % (07/27 1200) Weight:  [187 lb 6.3 oz (85 kg)] 187 lb 6.3 oz (85 kg) (07/27 1032)  CBC:   Recent Labs Lab 07/07/17 0409 07/11/17 0942  WBC 11.1* 10.7*  HGB 8.1* 9.6*  HCT 26.6* 31.5*  MCV 94.0 98.7  PLT 242 824    Basic Metabolic Panel:   Recent Labs Lab 07/08/17 1403 07/11/17 0942  NA 150* 142  K 4.4 4.1  CL 120* 114*  CO2 22 21*  GLUCOSE 219* 194*  BUN 48* 33*  CREATININE 1.32* 0.86  CALCIUM 7.8* 8.0*    Lipid Panel:     Component Value Date/Time   CHOL 115 06/27/2017 0500   TRIG 86 06/27/2017 0500   HDL 20 (L) 06/27/2017 0500   CHOLHDL 5.8 06/27/2017 0500   VLDL 17 06/27/2017 0500   LDLCALC 78 06/27/2017 0500   HgbA1c:  Lab Results  Component Value Date   HGBA1C 6.8 (H) 06/27/2017   Urine Drug Screen: No results found for: LABOPIA, COCAINSCRNUR, LABBENZ, AMPHETMU, THCU, LABBARB  Alcohol Level No results found for: [place] I have personally reviewed the radiological images below and agree with the radiology interpretations.  Ct Abdomen Pelvis Wo Contrast 06/27/2017 1. Hematoma within the proximal RIGHT anterior thigh is incompletely imaged. Hematoma likely spontaneous related to anticoagulation. Recommend clinical correlation with extent of hematoma in the RIGHT thigh.  2. No intraperitoneal or retroperitoneal hemorrhage.  3. Extensive diverticulosis without diverticulitis.  4.  Aortic Atherosclerosis (ICD10-I70.0).  5. Small bilateral pleural effusions.    Ct Angio  Head and neck W and Wo Contrast 06/26/2017 1. Emergent large vessel occlusion of the left M1 segment.  2. There is some reconstitution of anterior left MCA branch vessels from pial collaterals.  3. Dense calcifications at the carotid bifurcations bilaterally without significant stenoses relative to the more distal vessels.  4. Moderate narrowing of the proximal posterior cerebral arteries bilaterally, right greater than left.  5. Calcifications at great vessel origins along the arch without significant stenosis. 6. Hypoplastic left vertebral artery.  7. Multilevel cervical spinal spondylosis.  8. Emphysema.  9. Prior granulomatous disease in the right upper lobe.  Cerebral aneurysm and thrombectomy - Dr. Estanislado Pandy 06/26/2017 Status post endovascular complete revascularization of occluded left middle cerebral artery with 1 pass with Solitaire 4 mm x 40 mm FR retrieval device and a 6 [place] intermediary guide catheter achieving a TICI 3 reperfusion.  Mr Brain Wo Contrast 06/27/2017 Completed acute infarction affecting the majority of the inferior division left MCA distribution. This affects the left temporal lobe and posterior frontal lobe with patchy involvement in the parietal region.  Infarction of the caudate and putamen as well.  Petechial blood product deposition without focal hematoma.  Swelling but no shift at this time.  Ct Cerebral Perfusion W Contrast 06/26/2017 Left M1 emergent large vessel occlusion with associated acute left MCA territory infarct.  Core infarct is estimated at 49 mL.  The tissue at risk is estimated at  182 mL.   Ct Head Code Stroke W/o Cm 06/26/2017 1. Focal hyperdensity in left distal M1 may represent thrombus.  2. No vascular territory infarct identified at this time.  3. Small left frontal subdural hematoma is stable in size and attenuation from 2014.  4. ASPECTS is 10   06/29/2017 1. Evolving large LEFT MCA territory infarct (LEFT basal ganglia, LEFT  temporoparietal lobes) with petechial hemorrhage, no hemorrhagic conversion.  2. Acute small volume LEFT cerebrum extra-axial blood products.  3. Chronic changes include old small LEFT frontal subdural hematoma and small LEFT frontal encephalomalacia.    Transthoracic echocardiogram 06/28/2017 Study Conclusions - Left ventricle: The cavity size was normal. There was mild   concentric hypertrophy. Systolic function was normal. The    estimated ejection fraction was in the range of 50% to 55%.   Hypokinesis of the apical inferior and akinesis of the basal to   mid inferior myocardium. Doppler parameters are consistent with   indeterminate ventricular filling pressure. - Aortic valve: Valve mobility was restricted. There was mild   stenosis. There was mild regurgitation. Peak velocity (S): 300   cm/s. Mean gradient (S): 19 mm Hg. Valve area (VTI): 1.64 cm^2.   Valve area (Vmax): 1.49 cm^2. Valve area (Vmean): 1.39 cm^2.   Regurgitation pressure half-time: 723 ms. - Mitral valve: Transvalvular velocity was within the normal range.   There was no evidence for stenosis. There was no regurgitation. - Left atrium: The atrium was severely dilated. - Right ventricle: The cavity size was normal. Wall thickness was   normal. Systolic function was normal. - Tricuspid valve: There was mild regurgitation. - Pulmonary arteries: Systolic pressure was within the normal   range. PA peak pressure: 27 mm Hg (S)  Ct Head Wo Contrast 07/04/2017 IMPRESSION: 1. Unchanged extent of cytotoxic edema within the posterior left MCA territory and left basal ganglia with mass effect on the left lateral ventricle but no midline shift or hydrocephalus. 2. Decreased number of foci petechial hemorrhage. No hemorrhagic conversion/parenchymal hematoma.   Dg Chest Port 1 View 07/04/2017 IMPRESSION: 1. Retrocardiac opacity presumably on the basis of a left effusion and compressive atelectasis. A pneumonia is not entirely  excluded. 2. Mild unchanged vascular congestion is again seen with cardiomegaly and post CABG change.   LE venous doppler - There is no obvious evidence of deep or superficial vein thrombosis involving the right and left lower extremities. All clearly visualized vessels appear patent and compressible. There is no evidence of Baker's cysts bilaterally.  Dg Chest Port 1 View 07/05/2017 IMPRESSION: No change in left basilar changes.    PHYSICAL EXAM  Temp:  [97.8 F (36.6 C)-98.8 F (37.1 C)] 97.8 F (36.6 C) (07/27 1200) Pulse Rate:  [76-93] 76 (07/27 1024) Resp:  [16-21] 20 (07/27 1200) BP: (106-113)/(55-77) 106/58 (07/27 1200) SpO2:  [97 %-100 %] 97 % (07/27 1200) Weight:  [187 lb 6.3 oz (85 kg)] 187 lb 6.3 oz (85 kg) (07/27 1032)  General - frail elderly Caucasian male Ophthalmologic - Fundi not visualized due to noncooperation.  Cardiovascular - irregularly irregular heart rate and rhythm with RVR.  Extremities - right groin wound dress dry and clean.   Neuro -   awake alert,on nasal canula ,   eyes open, can visually track. Blinks to threat on the left but not on the right.  following only few occasional midline commands and not speaking ,  . PERRL. Right facial droop, tongue midline in mouth. Spontaneous movement of LUE and  LLE, at least 3/5 LUE and 2+/5 LLE. RUE and RLE mild withdraw on pain stimulation. DTR 1+ and right positive babinski. Sensation, coordination and gait not tested.    ASSESSMENT/PLAN Robert Keith is a 83 y.o. male with history of prostate cancer, CAD, history of GI bleed, DM2, essential tremor, hypertension, history of atrial fibrillation and subdural hematoma, s/p craniectomy and clot evacuation who presented with right facial droop, dense right hemiplegia global aphasia, and left gaze deviation. He recevied tPA at 1507 on 06/26/2017.  CTA Occluded left M1 -> IR thrombectomy TICI 3 reperfusion with Dr. Estanislado Pandy.  Stroke: left MCA infarct due to left M1  occlusion s/p thrombectomy due to afib not on AC  Resultant  aphasia and right hemiplegia  CT head:  Focal hyperdensity in left distal M1 may represent thrombus. Small left frontal subdural hematoma is stable in size and attenuation from 2014.   MRI head: left MCA infarct. Petechial blood product deposition without focal hematoma.  CT Angio Head: Emergent large vessel occlusion of the left M1 segment. Dense calcifications at the carotid bifurcations bilaterally without significant stenoses  2D Echo - EF 50-55%. Left atrium severely dilated. No cardiac source of emboli identified.  LDL 78  HgbA1c 6.8  SCDs for VTE prophylaxis Diet NPO time specified  No antithrombotic prior to admission, now on ASA 81mg . Pt deemed not candidate for anticoagulation per cardiology.  Patient counseled to be compliant with his antithrombotic medications  Ongoing aggressive stroke risk factor management  Therapy recommendations:  CIR  Disposition:   Transferred to stepdown  Right femoral pseudoaneurysm with massive thigh hematoma s/p IR due to PFA tear   VVS on board, appreciate recs  requiring surgical suturing and clot evacuation  Dress dry and clean  JP drain removed today  Afib not on [facility]  Not on AC at baseline due to previous SDH s/p crani and evacuation  Pt deemed not anticoagulation candidate per cardiology  On metoprolol for RVR  Rate currently 90-110  Cardiology on board and recs appreciated  Respiratory distress  CCM on board  Off BiPAP  LE venous doppler no DVT  PE unlikely at this time  Chest PT, NT suctioning and nebulization per CCM   AKI  Cre 0.90->1.99->2.93  Etiology not clear, could be due to dehydration, sepsis  On IVF and free water  BMP monitoring  Foley catheter inserted 07/07/17  Hypernatremia  Na 146->150->148  Continue 1/2NS and free water  BMP monitoring  Leukocytosis   WBC 11.1->9.3->7.4->13.1 ->  11.9->13.2->9.9->14.9  Suspicious for aspiration  On Fortaz  CXR stable     Hyperlipidemia  Home meds: pravastatin 40mg  PO daily  LDL 78, goal < 70  Statin resumed  Continue statin at discharge  Diabetes  HgbA1c 6.8, goal < 7.0  Controlled  Hyperglycemia improved  Resume home medications  SSI  Anemia, improved  Due to acute blood loss  Hb 9.0->8.2->7.8->7.8->8.7->8.7->9.3->9.0  Close monitoring  PRBC transfusion if needed  Other Stroke Risk Factors  Advanced age  Overweight, Body mass index is 27.67 kg/m., recommend weight loss, diet and exercise as appropriate   Family hx stroke (Mother)  CAD  Other Active Problems  Thrombocytopenia, resolved  hypokalemia, resolved  GI bleed history  Acute renal failure-new onset likely obstructive due to bladder outlet obstruction now resolved   Hospital day # 16 Plan   Patient is scheduled today for PEG tube. I have discussed with the patient's daughter at the bedside and she is agreeable.  Transferred to skilled nursing facility for rehabilitation next week..     This patient is critically ill due to large left MCA infarct s/p IR, right groin hematoma s/p evacuation, afib RVR, anemia, respiratory failure and at significant risk of neurological worsening, death form recurrent stroke, hemorrhagic transformation, shock, heart failure, aspiration pneumonia. This patient's care requires constant monitoring of vital signs, hemodynamics, respiratory and cardiac monitoring, review of multiple databases, neurological assessment, discussion with family, other specialists and medical decision making of high complexity. I spent 30 minutes of neurocritical care time in the care of this patient.   Robert Contras, MD   Stroke Neurology 07/12/2017 12:42 PM   To contact Stroke Continuity provider, please refer to [email or web address] After hours, contact General Neurology

## 2017-07-12 NOTE — Transfer of Care (Signed)
Immediate Anesthesia Transfer of Care Note  Patient: Robert Keith  Procedure(s) Performed: Procedure(s): ESOPHAGOGASTRODUODENOSCOPY (EGD) (N/A) PERCUTANEOUS ENDOSCOPIC GASTROSTOMY (PEG) PLACEMENT (N/A)  Patient Location: Endoscopy Unit  Anesthesia Type:MAC  Level of Consciousness: drowsy and patient cooperative  Airway & Oxygen Therapy: Patient Spontanous Breathing and Patient connected to nasal cannula oxygen  Post-op Assessment: Report given to RN, Post -op Vital signs reviewed and stable and Patient moving all extremities X 4  Post vital signs: Reviewed and stable  Last Vitals:  Vitals:   07/12/17 0955 07/12/17 1024  BP: 113/71 113/77  Pulse:  76  Resp:  20  Temp:  36.7 C    Last Pain:  Vitals:   07/12/17 1024  TempSrc: Oral  PainSc:          Complications: No apparent anesthesia complications

## 2017-07-13 ENCOUNTER — Inpatient Hospital Stay (HOSPITAL_COMMUNITY): Payer: Medicare Other

## 2017-07-13 LAB — CBC
HEMATOCRIT: 32.3 % — AB (ref 39.0–52.0)
Hemoglobin: 9.6 g/dL — ABNORMAL LOW (ref 13.0–17.0)
MCH: 29.1 pg (ref 26.0–34.0)
MCHC: 29.7 g/dL — AB (ref 30.0–36.0)
MCV: 97.9 fL (ref 78.0–100.0)
PLATELETS: 352 10*3/uL (ref 150–400)
RBC: 3.3 MIL/uL — ABNORMAL LOW (ref 4.22–5.81)
RDW: 18.5 % — AB (ref 11.5–15.5)
WBC: 9.5 10*3/uL (ref 4.0–10.5)

## 2017-07-13 LAB — GLUCOSE, CAPILLARY
GLUCOSE-CAPILLARY: 133 mg/dL — AB (ref 65–99)
GLUCOSE-CAPILLARY: 160 mg/dL — AB (ref 65–99)
GLUCOSE-CAPILLARY: 142 mg/dL — AB (ref 65–99)
Glucose-Capillary: 138 mg/dL — ABNORMAL HIGH (ref 65–99)
Glucose-Capillary: 144 mg/dL — ABNORMAL HIGH (ref 65–99)

## 2017-07-13 LAB — BASIC METABOLIC PANEL
Anion gap: 8 (ref 5–15)
BUN: 26 mg/dL — AB (ref 6–20)
CHLORIDE: 114 mmol/L — AB (ref 101–111)
CO2: 19 mmol/L — AB (ref 22–32)
Calcium: 8 mg/dL — ABNORMAL LOW (ref 8.9–10.3)
Creatinine, Ser: 0.7 mg/dL (ref 0.61–1.24)
GFR calc Af Amer: >60 mL/min (ref >60)
GFR calc non Af Amer: >60 mL/min (ref >60)
GLUCOSE: 140 mg/dL — AB (ref 65–99)
POTASSIUM: 4.7 mmol/L (ref 3.5–5.1)
SODIUM: 141 mmol/L (ref 135–145)

## 2017-07-13 MED ORDER — SODIUM CHLORIDE 0.9 % IV SOLN
250.0000 mL | INTRAVENOUS | Status: DC
  Administered 2017-07-13: 250 mL via INTRAVENOUS

## 2017-07-13 MED ORDER — HEPARIN SODIUM (PORCINE) 5000 UNIT/ML IJ SOLN
5000.0000 [IU] | Freq: Three times a day (TID) | INTRAMUSCULAR | Status: DC
  Administered 2017-07-13 – 2017-07-17 (×11): 5000 [IU] via SUBCUTANEOUS
  Filled 2017-07-13 (×11): qty 1

## 2017-07-13 MED ORDER — FREE WATER
300.0000 mL | Freq: Four times a day (QID) | Status: DC
  Administered 2017-07-13 – 2017-07-17 (×16): 300 mL

## 2017-07-13 NOTE — Progress Notes (Signed)
STROKE TEAM PROGRESS NOTE   SUBJECTIVE (INTERVAL HISTORY) His caregiver is at the bedside. Pt  Awake and more interactive today, no respiratory distress. PEG done yesterday and restarted TF and so far tolerating well.   OBJECTIVE Temp:  [97.8 F (36.6 C)-98.7 F (37.1 C)] 98.7 F (37.1 C) (07/28 0307) Pulse Rate:  [74-107] 82 (07/28 0307) Cardiac Rhythm: Atrial fibrillation (07/28 0700) Resp:  [15-21] 18 (07/28 0307) BP: (106-124)/(58-77) 107/65 (07/28 0307) SpO2:  [97 %-99 %] 99 % (07/28 0307) Weight:  [85 kg (187 lb 6.3 oz)-85.6 kg (188 lb 11.4 oz)] 85.6 kg (188 lb 11.4 oz) (07/28 0307)  CBC:   Recent Labs Lab 07/11/17 0942 07/13/17 0640  WBC 10.7* 9.5  HGB 9.6* 9.6*  HCT 31.5* 32.3*  MCV 98.7 97.9  PLT 309 568    Basic Metabolic Panel:   Recent Labs Lab 07/11/17 0942 07/13/17 0327  NA 142 141  K 4.1 4.7  CL 114* 114*  CO2 21* 19*  GLUCOSE 194* 140*  BUN 33* 26*  CREATININE 0.86 0.70  CALCIUM 8.0* 8.0*    Lipid Panel:     Component Value Date/Time   CHOL 115 06/27/2017 0500   TRIG 86 06/27/2017 0500   HDL 20 (L) 06/27/2017 0500   CHOLHDL 5.8 06/27/2017 0500   VLDL 17 06/27/2017 0500   LDLCALC 78 06/27/2017 0500   HgbA1c:  Lab Results  Component Value Date   HGBA1C 6.8 (H) 06/27/2017   Urine Drug Screen: No results found for: LABOPIA, COCAINSCRNUR, LABBENZ, AMPHETMU, THCU, LABBARB  Alcohol Level No results found for: [place] I have personally reviewed the radiological images below and agree with the radiology interpretations.  Ct Abdomen Pelvis Wo Contrast 06/27/2017 1. Hematoma within the proximal RIGHT anterior thigh is incompletely imaged. Hematoma likely spontaneous related to anticoagulation. Recommend clinical correlation with extent of hematoma in the RIGHT thigh.  2. No intraperitoneal or retroperitoneal hemorrhage.  3. Extensive diverticulosis without diverticulitis.  4.  Aortic Atherosclerosis (ICD10-I70.0).  5. Small bilateral  pleural effusions.    Ct Angio Head and neck W and Wo Contrast 06/26/2017 1. Emergent large vessel occlusion of the left M1 segment.  2. There is some reconstitution of anterior left MCA branch vessels from pial collaterals.  3. Dense calcifications at the carotid bifurcations bilaterally without significant stenoses relative to the more distal vessels.  4. Moderate narrowing of the proximal posterior cerebral arteries bilaterally, right greater than left.  5. Calcifications at great vessel origins along the arch without significant stenosis. 6. Hypoplastic left vertebral artery.  7. Multilevel cervical spinal spondylosis.  8. Emphysema.  9. Prior granulomatous disease in the right upper lobe.  Cerebral aneurysm and thrombectomy - Dr. Estanislado Pandy 06/26/2017 Status post endovascular complete revascularization of occluded left middle cerebral artery with 1 pass with Solitaire 4 mm x 40 mm FR retrieval device and a 6 [place] intermediary guide catheter achieving a TICI 3 reperfusion.  Mr Brain Wo Contrast 06/27/2017 Completed acute infarction affecting the majority of the inferior division left MCA distribution. This affects the left temporal lobe and posterior frontal lobe with patchy involvement in the parietal region.  Infarction of the caudate and putamen as well.  Petechial blood product deposition without focal hematoma.  Swelling but no shift at this time.  Ct Cerebral Perfusion W Contrast 06/26/2017 Left M1 emergent large vessel occlusion with associated acute left MCA territory infarct.  Core infarct is estimated at 49 mL.  The tissue at risk is estimated  at 182 mL.   Ct Head Code Stroke W/o Cm 06/26/2017 1. Focal hyperdensity in left distal M1 may represent thrombus.  2. No vascular territory infarct identified at this time.  3. Small left frontal subdural hematoma is stable in size and attenuation from 2014.  4. ASPECTS is 10   06/29/2017 1. Evolving large LEFT MCA territory  infarct (LEFT basal ganglia, LEFT temporoparietal lobes) with petechial hemorrhage, no hemorrhagic conversion.  2. Acute small volume LEFT cerebrum extra-axial blood products.  3. Chronic changes include old small LEFT frontal subdural hematoma and small LEFT frontal encephalomalacia.    Transthoracic echocardiogram 06/28/2017 Study Conclusions - Left ventricle: The cavity size was normal. There was mild   concentric hypertrophy. Systolic function was normal. The    estimated ejection fraction was in the range of 50% to 55%.   Hypokinesis of the apical inferior and akinesis of the basal to   mid inferior myocardium. Doppler parameters are consistent with   indeterminate ventricular filling pressure. - Aortic valve: Valve mobility was restricted. There was mild   stenosis. There was mild regurgitation. Peak velocity (S): 300   cm/s. Mean gradient (S): 19 mm Hg. Valve area (VTI): 1.64 cm^2.   Valve area (Vmax): 1.49 cm^2. Valve area (Vmean): 1.39 cm^2.   Regurgitation pressure half-time: 723 ms. - Mitral valve: Transvalvular velocity was within the normal range.   There was no evidence for stenosis. There was no regurgitation. - Left atrium: The atrium was severely dilated. - Right ventricle: The cavity size was normal. Wall thickness was   normal. Systolic function was normal. - Tricuspid valve: There was mild regurgitation. - Pulmonary arteries: Systolic pressure was within the normal   range. PA peak pressure: 27 mm Hg (S)  Ct Head Wo Contrast 07/04/2017 IMPRESSION: 1. Unchanged extent of cytotoxic edema within the posterior left MCA territory and left basal ganglia with mass effect on the left lateral ventricle but no midline shift or hydrocephalus. 2. Decreased number of foci petechial hemorrhage. No hemorrhagic conversion/parenchymal hematoma.   Dg Chest Port 1 View 07/04/2017 IMPRESSION: 1. Retrocardiac opacity presumably on the basis of a left effusion and compressive  atelectasis. A pneumonia is not entirely excluded. 2. Mild unchanged vascular congestion is again seen with cardiomegaly and post CABG change.   LE venous doppler - There is no obvious evidence of deep or superficial vein thrombosis involving the right and left lower extremities. All clearly visualized vessels appear patent and compressible. There is no evidence of Baker's cysts bilaterally.  Dg Chest Port 1 View 07/05/2017 IMPRESSION: No change in left basilar changes.   US renal  Mild bilateral hydronephrosis.   PHYSICAL EXAM  Temp:  [97.8 F (36.6 C)-98.7 F (37.1 C)] 98.7 F (37.1 C) (07/28 0307) Pulse Rate:  [74-107] 82 (07/28 0307) Resp:  [15-21] 18 (07/28 0307) BP: (106-124)/(58-77) 107/65 (07/28 0307) SpO2:  [97 %-99 %] 99 % (07/28 0307) Weight:  [85 kg (187 lb 6.3 oz)-85.6 kg (188 lb 11.4 oz)] 85.6 kg (188 lb 11.4 oz) (07/28 0307)  General - frail elderly Caucasian male Ophthalmologic - Fundi not visualized due to noncooperation.  Cardiovascular - irregularly irregular heart rate and rhythm with RVR.  Extremities - right groin wound dress dry and clean.   Neuro -   awake alert, on nasal canula , eyes open, can visually track. Blinks to threat on the left but not on the right.  Global aphasia except able to close eyes on command, intelligible words. PERRL. Right  facial droop, tongue midline in mouth. Spontaneous movement of LUE and LLE, at least 3/5 LUE and 2+/5 LLE. RUE 0/5 and RLE 2/5. DTR 1+ and right positive babinski. Sensation, coordination and gait not tested.   ASSESSMENT/PLAN Robert Keith is a 83 y.o. male with history of prostate cancer, CAD, history of GI bleed, DM2, essential tremor, hypertension, history of atrial fibrillation and subdural hematoma, s/p craniectomy and clot evacuation who presented with right facial droop, dense right hemiplegia global aphasia, and left gaze deviation. He recevied tPA at 1507 on 06/26/2017.  CTA Occluded left M1 -> IR  thrombectomy TICI 3 reperfusion with Dr. Estanislado Pandy.  Stroke: left MCA infarct due to left M1 occlusion s/p thrombectomy due to afib not on AC  Resultant  aphasia and right hemiplegia  CT head:  Focal hyperdensity in left distal M1 may represent thrombus. Small left frontal subdural hematoma is stable in size and attenuation from 2014.   MRI head: left MCA infarct. Petechial blood product deposition without focal hematoma.  CT Angio Head: Emergent large vessel occlusion of the left M1 segment. Dense calcifications at the carotid bifurcations bilaterally without significant stenoses  2D Echo - EF 50-55%. Left atrium severely dilated. No cardiac source of emboli identified.  LDL 78  HgbA1c 6.8  SCDs for VTE prophylaxis Diet NPO time specified  No antithrombotic prior to admission, now on ASA 81mg . Pt deemed not candidate for anticoagulation per cardiology.  Patient counseled to be compliant with his antithrombotic medications  Ongoing aggressive stroke risk factor management  Therapy recommendations:  SNF  Disposition:   Transferred to floor  Right femoral pseudoaneurysm with massive thigh hematoma s/p IR due to PFA tear   VVS on board, appreciate recs  requiring surgical suturing and clot evacuation  Dress dry and clean  JP drain removed   Afib not on [facility]  Not on AC at baseline due to previous SDH s/p crani and evacuation  Pt deemed not anticoagulation candidate per cardiology  On metoprolol for RVR  Rate currently 90-110  Cardiology on board and recs appreciated  Respiratory distress, resolved  CCM help appreciated  Off BiPAP  LE venous doppler no DVT  NT suction PRN  Stable currently  AKI, resolved  Cre 0.90->1.99->2.93->4.5->3.2->1.3->0.7  Due to urinary retention  Nephrology help appreciated  Foley catheter inserted 07/07/17  Hypernatremia  Na 146->150->148->142->141  Continue NS @ 30 and free water  BMP monitoring  On TF @  40  Leukocytosis, resolved  WBC 11.1->9.3->7.4->13.1 -> 11.9->13.2->9.9->14.9->11.4->10.7->9.5  Suspicious for aspiration  On Fortaz, now off  CXR stable  Hyperlipidemia  Home meds: pravastatin 40mg  PO daily  LDL 78, goal < 70  Statin resumed  Continue statin at discharge  Diabetes  HgbA1c 6.8, goal < 7.0  Controlled  Hyperglycemia improved  Resume home medications  SSI  Anemia, improved  Due to acute blood loss  Hb 9.0->8.2->7.8->7.8->8.7->8.7->9.3->9.0->9.6  On ASA  Other Stroke Risk Factors  Advanced age  Overweight, Body mass index is 27.87 kg/m., recommend weight loss, diet and exercise as appropriate   Family hx stroke (Mother)  CAD  Other Active Problems  Thrombocytopenia, resolved  hypokalemia, resolved  GI bleed history  Hospital day # 17  Rosalin Hawking, MD PhD Stroke Neurology 07/13/2017 11:34 AM   To contact Stroke Continuity provider, please refer to [email or web address] After hours, contact General Neurology

## 2017-07-13 NOTE — Progress Notes (Signed)
Patient is admitted to 5C21 from [facility]. Patient is AO X2 and family is at the bedside

## 2017-07-13 NOTE — Progress Notes (Signed)
Patient to transfer to 5C21 report given to receiving nurse [facility], all questions answered at this time.  Pt. VSS with no s/s of distress noted.  Patient stable at transfer.

## 2017-07-13 NOTE — Progress Notes (Signed)
Patient tolerating tube feedings without pain or discomfort.  We will sign off ofr n ow.  Tube needs to remain in place for 6 weeks before it can be removed safely  Robert Keith. [name], MD, [place] [phone] [facility] Surgery

## 2017-07-14 LAB — BASIC METABOLIC PANEL
Anion gap: 6 (ref 5–15)
BUN: 22 mg/dL — AB (ref 6–20)
CHLORIDE: 111 mmol/L (ref 101–111)
CO2: 22 mmol/L (ref 22–32)
Calcium: 7.9 mg/dL — ABNORMAL LOW (ref 8.9–10.3)
Creatinine, Ser: 0.74 mg/dL (ref 0.61–1.24)
GFR calc Af Amer: >60 mL/min (ref >60)
GFR calc non Af Amer: >60 mL/min (ref >60)
GLUCOSE: 137 mg/dL — AB (ref 65–99)
POTASSIUM: 3.9 mmol/L (ref 3.5–5.1)
Sodium: 139 mmol/L (ref 135–145)

## 2017-07-14 LAB — URINALYSIS, COMPLETE (UACMP) WITH MICROSCOPIC
Bilirubin Urine: NEGATIVE
Glucose, UA: 50 mg/dL — AB
Ketones, ur: NEGATIVE mg/dL
Nitrite: NEGATIVE
PROTEIN: 30 mg/dL — AB
Specific Gravity, Urine: 1.015 (ref 1.005–1.030)
pH: 6 (ref 5.0–8.0)

## 2017-07-14 LAB — GLUCOSE, CAPILLARY
GLUCOSE-CAPILLARY: 149 mg/dL — AB (ref 65–99)
GLUCOSE-CAPILLARY: 134 mg/dL — AB (ref 65–99)
GLUCOSE-CAPILLARY: 158 mg/dL — AB (ref 65–99)
GLUCOSE-CAPILLARY: 99 mg/dL (ref 65–99)
GLUCOSE-CAPILLARY: 141 mg/dL — AB (ref 65–99)
Glucose-Capillary: 146 mg/dL — ABNORMAL HIGH (ref 65–99)

## 2017-07-14 LAB — CBC
HCT: 30.5 % — ABNORMAL LOW (ref 39.0–52.0)
HEMOGLOBIN: 9.3 g/dL — AB (ref 13.0–17.0)
MCH: 29.5 pg (ref 26.0–34.0)
MCHC: 30.5 g/dL (ref 30.0–36.0)
MCV: 96.8 fL (ref 78.0–100.0)
Platelets: 339 10*3/uL (ref 150–400)
RBC: 3.15 MIL/uL — AB (ref 4.22–5.81)
RDW: 18.9 % — ABNORMAL HIGH (ref 11.5–15.5)
WBC: 11.1 10*3/uL — ABNORMAL HIGH (ref 4.0–10.5)

## 2017-07-14 MED ORDER — ASPIRIN 325 MG PO TABS
325.0000 mg | ORAL_TABLET | Freq: Every day | ORAL | Status: DC
  Administered 2017-07-15 – 2017-07-17 (×3): 325 mg via ORAL
  Filled 2017-07-14 (×3): qty 1

## 2017-07-14 NOTE — Progress Notes (Signed)
STROKE TEAM PROGRESS NOTE   SUBJECTIVE (INTERVAL HISTORY) No family is at the bedside. Pt  Awake and alert, tracking, no respiratory distress. Tolerating TF well. Mild leukocytosis today.   OBJECTIVE Temp:  [98.2 F (36.8 C)-99.2 F (37.3 C)] 99 F (37.2 C) (07/29 0800) Pulse Rate:  [74-97] 85 (07/29 0800) Cardiac Rhythm: Atrial fibrillation (07/29 0800) Resp:  [16-25] 16 (07/29 0800) BP: (110-127)/(58-73) 127/64 (07/29 0800) SpO2:  [95 %-98 %] 95 % (07/29 0800) Weight:  [86 kg (189 lb 9.5 oz)] 86 kg (189 lb 9.5 oz) (07/29 0500)  CBC:   Recent Labs Lab 07/13/17 0640 07/14/17 0356  WBC 9.5 11.1*  HGB 9.6* 9.3*  HCT 32.3* 30.5*  MCV 97.9 96.8  PLT 352 629    Basic Metabolic Panel:   Recent Labs Lab 07/13/17 0327 07/14/17 0356  NA 141 139  K 4.7 3.9  CL 114* 111  CO2 19* 22  GLUCOSE 140* 137*  BUN 26* 22*  CREATININE 0.70 0.74  CALCIUM 8.0* 7.9*    Lipid Panel:     Component Value Date/Time   CHOL 115 06/27/2017 0500   TRIG 86 06/27/2017 0500   HDL 20 (L) 06/27/2017 0500   CHOLHDL 5.8 06/27/2017 0500   VLDL 17 06/27/2017 0500   LDLCALC 78 06/27/2017 0500   HgbA1c:  Lab Results  Component Value Date   HGBA1C 6.8 (H) 06/27/2017   Urine Drug Screen: No results found for: LABOPIA, COCAINSCRNUR, LABBENZ, AMPHETMU, THCU, LABBARB  Alcohol Level No results found for: [place] I have personally reviewed the radiological images below and agree with the radiology interpretations.  Ct Abdomen Pelvis Wo Contrast 06/27/2017 1. Hematoma within the proximal RIGHT anterior thigh is incompletely imaged. Hematoma likely spontaneous related to anticoagulation. Recommend clinical correlation with extent of hematoma in the RIGHT thigh.  2. No intraperitoneal or retroperitoneal hemorrhage.  3. Extensive diverticulosis without diverticulitis.  4.  Aortic Atherosclerosis (ICD10-I70.0).  5. Small bilateral pleural effusions.    Ct Angio Head and neck W and Wo  Contrast 06/26/2017 1. Emergent large vessel occlusion of the left M1 segment.  2. There is some reconstitution of anterior left MCA branch vessels from pial collaterals.  3. Dense calcifications at the carotid bifurcations bilaterally without significant stenoses relative to the more distal vessels.  4. Moderate narrowing of the proximal posterior cerebral arteries bilaterally, right greater than left.  5. Calcifications at great vessel origins along the arch without significant stenosis. 6. Hypoplastic left vertebral artery.  7. Multilevel cervical spinal spondylosis.  8. Emphysema.  9. Prior granulomatous disease in the right upper lobe.  Cerebral angiogram and thrombectomy - Dr. Estanislado Pandy 06/26/2017 Status post endovascular complete revascularization of occluded left middle cerebral artery with 1 pass with Solitaire 4 mm x 40 mm FR retrieval device and a 6 [place] intermediary guide catheter achieving a TICI 3 reperfusion.  Mr Brain Wo Contrast 06/27/2017 Completed acute infarction affecting the majority of the inferior division left MCA distribution. This affects the left temporal lobe and posterior frontal lobe with patchy involvement in the parietal region.  Infarction of the caudate and putamen as well.  Petechial blood product deposition without focal hematoma.  Swelling but no shift at this time.  Ct Cerebral Perfusion W Contrast 06/26/2017 Left M1 emergent large vessel occlusion with associated acute left MCA territory infarct.  Core infarct is estimated at 49 mL.  The tissue at risk is estimated at 182 mL.   Ct Head Code Stroke W/o Cm  06/26/2017 1. Focal hyperdensity in left distal M1 may represent thrombus.  2. No vascular territory infarct identified at this time.  3. Small left frontal subdural hematoma is stable in size and attenuation from 2014.  4. ASPECTS is 10   06/29/2017 1. Evolving large LEFT MCA territory infarct (LEFT basal ganglia, LEFT temporoparietal lobes)  with petechial hemorrhage, no hemorrhagic conversion.  2. Acute small volume LEFT cerebrum extra-axial blood products.  3. Chronic changes include old small LEFT frontal subdural hematoma and small LEFT frontal encephalomalacia.    Transthoracic echocardiogram 06/28/2017 Study Conclusions - Left ventricle: The cavity size was normal. There was mild   concentric hypertrophy. Systolic function was normal. The    estimated ejection fraction was in the range of 50% to 55%.   Hypokinesis of the apical inferior and akinesis of the basal to   mid inferior myocardium. Doppler parameters are consistent with   indeterminate ventricular filling pressure. - Aortic valve: Valve mobility was restricted. There was mild   stenosis. There was mild regurgitation. Peak velocity (S): 300   cm/s. Mean gradient (S): 19 mm Hg. Valve area (VTI): 1.64 cm^2.   Valve area (Vmax): 1.49 cm^2. Valve area (Vmean): 1.39 cm^2.   Regurgitation pressure half-time: 723 ms. - Mitral valve: Transvalvular velocity was within the normal range.   There was no evidence for stenosis. There was no regurgitation. - Left atrium: The atrium was severely dilated. - Right ventricle: The cavity size was normal. Wall thickness was   normal. Systolic function was normal. - Tricuspid valve: There was mild regurgitation. - Pulmonary arteries: Systolic pressure was within the normal   range. PA peak pressure: 27 mm Hg (S)  Ct Head Wo Contrast 07/04/2017 IMPRESSION:  1. Unchanged extent of cytotoxic edema within the posterior left MCA territory and left basal ganglia with mass effect on the left lateral ventricle but no midline shift or hydrocephalus.  2. Decreased number of foci petechial hemorrhage. No hemorrhagic conversion/parenchymal hematoma.   Dg Chest Port 1 View 07/04/2017 IMPRESSION: 1. Retrocardiac opacity presumably on the basis of a left effusion and compressive atelectasis. A pneumonia is not entirely excluded.  2. Mild  unchanged vascular congestion is again seen with cardiomegaly and post CABG change.   LE venous doppler - There is no obvious evidence of deep or superficial vein thrombosis involving the right and left lower extremities. All clearly visualized vessels appear patent and compressible. There is no evidence of Baker's cysts bilaterally.  Dg Chest Port 1 View 07/05/2017 IMPRESSION: No change in left basilar changes.   US renal  Mild bilateral hydronephrosis.  CXR 07/13/17 Continued left lower lobe atelectasis or infiltrate with small left Effusion. Cardiomegaly, vascular congestion.   PHYSICAL EXAM  Temp:  [98.2 F (36.8 C)-99.2 F (37.3 C)] 99 F (37.2 C) (07/29 0800) Pulse Rate:  [74-97] 85 (07/29 0800) Resp:  [16-25] 16 (07/29 0800) BP: (110-127)/(58-73) 127/64 (07/29 0800) SpO2:  [95 %-98 %] 95 % (07/29 0800) Weight:  [86 kg (189 lb 9.5 oz)] 86 kg (189 lb 9.5 oz) (07/29 0500)  General - frail elderly Caucasian male Ophthalmologic - Fundi not visualized due to noncooperation.  Cardiovascular - irregularly irregular heart rate and rhythm with RVR.  Extremities - right groin wound dress dry and clean.   Neuro -   awake alert, on nasal canula , eyes open, can visually track. Blinks to threat on the left but not on the right.  Global aphasia except able to close eyes on command,  intelligible words. PERRL. Right facial droop, tongue midline in mouth. Spontaneous movement of LUE and LLE, at least 3/5 LUE and 2+/5 LLE. RUE 0/5 and RLE 2/5. DTR 1+ and right positive babinski. Sensation, coordination and gait not tested.   ASSESSMENT/PLAN Mr. ONAJE WARNE is a 83 y.o. male with history of prostate cancer, CAD, history of GI bleed, DM2, essential tremor, hypertension, history of atrial fibrillation and subdural hematoma, s/p craniectomy and clot evacuation who presented with right facial droop, dense right hemiplegia global aphasia, and left gaze deviation. He recevied tPA at 1507 on  06/26/2017.  CTA Occluded left M1 -> IR thrombectomy TICI 3 reperfusion with Dr. Estanislado Pandy.  Stroke: left MCA infarct due to left M1 occlusion s/p thrombectomy due to afib not on AC  Resultant  aphasia and right hemiplegia  CT head:  Focal hyperdensity in left distal M1 may represent thrombus. Small left frontal subdural hematoma is stable in size and attenuation from 2014.   MRI head: left MCA infarct. Petechial blood product deposition without focal hematoma.  CT Angio Head: Emergent large vessel occlusion of the left M1 segment. Dense calcifications at the carotid bifurcations bilaterally without significant stenoses  2D Echo - EF 50-55%. Left atrium severely dilated. No cardiac source of emboli identified.  LDL 78  HgbA1c 6.8  SCDs for VTE prophylaxis Diet NPO time specified  No antithrombotic prior to admission, now on ASA 325mg . Pt deemed not candidate for anticoagulation per cardiology.  Patient counseled to be compliant with his antithrombotic medications  Ongoing aggressive stroke risk factor management  Therapy recommendations:  SNF  Disposition:   Transferred to floor  Right femoral pseudoaneurysm with massive thigh hematoma s/p IR due to PFA tear   VVS on board, appreciate recs  Requiring surgical suturing and clot evacuation  Dressing dry and clean  JP drain removed   Afib not on [facility]  Not on AC at baseline due to previous SDH s/p crani and evacuation  Pt deemed not anticoagulation candidate per cardiology  On metoprolol for RVR  Rate currently 90-110  Cardiology on board and recs appreciated  Respiratory distress, resolved  CCM help appreciated  Off BiPAP  LE venous doppler no DVT  NT suction PRN  Stable currently  AKI, resolved  Cre 0.90->1.99->2.93->4.5->3.2->1.3->0.7->0.74  Due to urinary retention  Nephrology help appreciated  Foley catheter inserted 07/07/17  Hypernatremia  Na 146->150->148->142->141->139  Continue NS @ 30  and free water  BMP monitoring  On TF @ 40  Leukocytosis  WBC 11.1->9.3->7.4->13.1 -> 11.9->13.2->9.9->14.9->11.4->10.7->9.5-> 11.1 (temp 99 ax)  CXR stable  Continue to monitor  Hyperlipidemia  Home meds: pravastatin 40mg  PO daily  LDL 78, goal < 70  Statin resumed  Continue statin at discharge  Diabetes  HgbA1c 6.8, goal < 7.0  Controlled  Hyperglycemia under control now  Resumed home medications  SSI  Anemia, improved  Due to acute blood loss  Hb 9.0->8.2->7.8->7.8->8.7->8.7->9.3->9.0->9.6-> 9.3  On ASA   Other Stroke Risk Factors  Advanced age  Family hx stroke (Mother)  CAD  Other Active Problems  Thrombocytopenia, resolved  hypokalemia, resolved  GI bleed history   Hospital day # 18  Rosalin Hawking, MD PhD Stroke Neurology 07/14/2017 2:23 PM   To contact Stroke Continuity provider, please refer to [email or web address] After hours, contact General Neurology

## 2017-07-15 ENCOUNTER — Inpatient Hospital Stay (HOSPITAL_COMMUNITY): Payer: Medicare Other

## 2017-07-15 DIAGNOSIS — R509 Fever, unspecified: Secondary | ICD-10-CM

## 2017-07-15 LAB — BASIC METABOLIC PANEL
ANION GAP: 7 (ref 5–15)
BUN: 24 mg/dL — ABNORMAL HIGH (ref 6–20)
CALCIUM: 7.9 mg/dL — AB (ref 8.9–10.3)
CO2: 21 mmol/L — ABNORMAL LOW (ref 22–32)
CREATININE: 0.77 mg/dL (ref 0.61–1.24)
Chloride: 110 mmol/L (ref 101–111)
GLUCOSE: 133 mg/dL — AB (ref 65–99)
Potassium: 4.4 mmol/L (ref 3.5–5.1)
Sodium: 138 mmol/L (ref 135–145)

## 2017-07-15 LAB — CBC
HCT: 30.8 % — ABNORMAL LOW (ref 39.0–52.0)
HEMOGLOBIN: 9.4 g/dL — AB (ref 13.0–17.0)
MCH: 29.6 pg (ref 26.0–34.0)
MCHC: 30.5 g/dL (ref 30.0–36.0)
MCV: 96.9 fL (ref 78.0–100.0)
Platelets: 311 10*3/uL (ref 150–400)
RBC: 3.18 MIL/uL — AB (ref 4.22–5.81)
RDW: 18.6 % — ABNORMAL HIGH (ref 11.5–15.5)
WBC: 16.8 10*3/uL — AB (ref 4.0–10.5)

## 2017-07-15 LAB — URINALYSIS, COMPLETE (UACMP) WITH MICROSCOPIC
BILIRUBIN URINE: NEGATIVE
Glucose, UA: 150 mg/dL — AB
KETONES UR: NEGATIVE mg/dL
LEUKOCYTES UA: NEGATIVE
NITRITE: NEGATIVE
PH: 5 (ref 5.0–8.0)
Protein, ur: 30 mg/dL — AB
SPECIFIC GRAVITY, URINE: 1.016 (ref 1.005–1.030)
Squamous Epithelial / LPF: NONE SEEN

## 2017-07-15 LAB — GLUCOSE, CAPILLARY
GLUCOSE-CAPILLARY: 156 mg/dL — AB (ref 65–99)
GLUCOSE-CAPILLARY: 146 mg/dL — AB (ref 65–99)
GLUCOSE-CAPILLARY: 164 mg/dL — AB (ref 65–99)
Glucose-Capillary: 105 mg/dL — ABNORMAL HIGH (ref 65–99)
Glucose-Capillary: 155 mg/dL — ABNORMAL HIGH (ref 65–99)
Glucose-Capillary: 156 mg/dL — ABNORMAL HIGH (ref 65–99)
Glucose-Capillary: 193 mg/dL — ABNORMAL HIGH (ref 65–99)

## 2017-07-15 MED ORDER — PIPERACILLIN-TAZOBACTAM 3.375 G IVPB
3.3750 g | Freq: Three times a day (TID) | INTRAVENOUS | Status: DC
Start: 2017-07-15 — End: 2017-07-17
  Administered 2017-07-15 – 2017-07-17 (×7): 3.375 g via INTRAVENOUS
  Filled 2017-07-15 (×9): qty 50

## 2017-07-15 MED ORDER — SODIUM CHLORIDE 0.9 % IV SOLN
250.0000 mL | INTRAVENOUS | Status: DC
  Administered 2017-07-15: 250 mL via INTRAVENOUS

## 2017-07-15 NOTE — Progress Notes (Signed)
CSW met with patient's daughter and patient's caregiver at bedside to provide additional bed offers for SNF placement. CSW provided print out list of facility responses, so that patient's daughter would have contact information to complete research on which facilities would be preferable.   Patient's daughter also requested a print out of the referral that was sent to facilities. CSW explained that medical records from a current admission cannot be released until after the patient is discharged, and patient's daughter expressed that she had a right to obtain any medical information that she wanted at any time. CSW indicated that she would check in on hospital policy and get back to the patient's daughter with additional information.  CSW consulted with Floor Nursing Director and obtained information from Medical Records to clarify that records cannot be released, and the patient's daughter will need to contact the Medical Records office with questions and concerns. CSW will provide information to the patient's daughter.  CSW will continue to follow to assist in discharge planning to SNF.  Laveda Abbe, [place] Clinical Social Worker [phone]

## 2017-07-15 NOTE — Consult Note (Addendum)
[facility] Nurse wound consult note WOC consult requested for right groin dressing.  This post-op wound is currently being followed by the vascular team and they have ordered dry gauze packing, refer to consult note on 7/25. Please contact their team for further questions regarding plan of care for this location. Please re-consult if further assistance is needed.  Thank-you,  Julien Girt MSN, [place], [place], [place], [place]

## 2017-07-15 NOTE — Progress Notes (Signed)
STROKE TEAM PROGRESS NOTE   SUBJECTIVE (INTERVAL HISTORY) Two daughters are at the bedside. Pt eyes open, lethargic, not as awake and alert as before. Had low grade fever overnight. Right groin wound dressing dry and clean, no sign of infection. However, pt has leukocytosis today, WBC up to 16.8.   OBJECTIVE Temp:  [98.6 F (37 C)-100.7 F (38.2 C)] 99.8 F (37.7 C) (07/30 1429) Pulse Rate:  [71-114] 103 (07/30 1429) Cardiac Rhythm: Atrial fibrillation (07/30 0830) Resp:  [16-23] 20 (07/30 1429) BP: (108-129)/(44-67) 117/44 (07/30 1429) SpO2:  [95 %-98 %] 97 % (07/30 1429) Weight:  [191 lb 12.8 oz (87 kg)] 191 lb 12.8 oz (87 kg) (07/30 0437)  CBC:   Recent Labs Lab 07/14/17 0356 07/15/17 0919  WBC 11.1* 16.8*  HGB 9.3* 9.4*  HCT 30.5* 30.8*  MCV 96.8 96.9  PLT 339 093    Basic Metabolic Panel:   Recent Labs Lab 07/14/17 0356 07/15/17 0654  NA 139 138  K 3.9 4.4  CL 111 110  CO2 22 21*  GLUCOSE 137* 133*  BUN 22* 24*  CREATININE 0.74 0.77  CALCIUM 7.9* 7.9*    Lipid Panel:     Component Value Date/Time   CHOL 115 06/27/2017 0500   TRIG 86 06/27/2017 0500   HDL 20 (L) 06/27/2017 0500   CHOLHDL 5.8 06/27/2017 0500   VLDL 17 06/27/2017 0500   LDLCALC 78 06/27/2017 0500   HgbA1c:  Lab Results  Component Value Date   HGBA1C 6.8 (H) 06/27/2017   Urine Drug Screen: No results found for: LABOPIA, COCAINSCRNUR, LABBENZ, AMPHETMU, THCU, LABBARB  Alcohol Level No results found for: [place] I have personally reviewed the radiological images below and agree with the radiology interpretations.  Ct Abdomen Pelvis Wo Contrast 06/27/2017 1. Hematoma within the proximal RIGHT anterior thigh is incompletely imaged. Hematoma likely spontaneous related to anticoagulation. Recommend clinical correlation with extent of hematoma in the RIGHT thigh.  2. No intraperitoneal or retroperitoneal hemorrhage.  3. Extensive diverticulosis without diverticulitis.  4.  Aortic  Atherosclerosis (ICD10-I70.0).  5. Small bilateral pleural effusions.   Ct Angio Head and neck W and Wo Contrast 06/26/2017 1. Emergent large vessel occlusion of the left M1 segment.  2. There is some reconstitution of anterior left MCA branch vessels from pial collaterals.  3. Dense calcifications at the carotid bifurcations bilaterally without significant stenoses relative to the more distal vessels.  4. Moderate narrowing of the proximal posterior cerebral arteries bilaterally, right greater than left.  5. Calcifications at great vessel origins along the arch without significant stenosis. 6. Hypoplastic left vertebral artery.  7. Multilevel cervical spinal spondylosis.  8. Emphysema.  9. Prior granulomatous disease in the right upper lobe.  Cerebral angiogram and thrombectomy - Dr. Estanislado Pandy 06/26/2017 Status post endovascular complete revascularization of occluded left middle cerebral artery with 1 pass with Solitaire 4 mm x 40 mm FR retrieval device and a 6 [place] intermediary guide catheter achieving a TICI 3 reperfusion.  Mr Brain Wo Contrast 06/27/2017 Completed acute infarction affecting the majority of the inferior division left MCA distribution. This affects the left temporal lobe and posterior frontal lobe with patchy involvement in the parietal region.  Infarction of the caudate and putamen as well.  Petechial blood product deposition without focal hematoma.  Swelling but no shift at this time.  Ct Cerebral Perfusion W Contrast 06/26/2017 Left M1 emergent large vessel occlusion with associated acute left MCA territory infarct.  Core infarct is estimated at  49 mL.  The tissue at risk is estimated at 182 mL.   Ct Head Code Stroke W/o Cm 06/26/2017 1. Focal hyperdensity in left distal M1 may represent thrombus.  2. No vascular territory infarct identified at this time.  3. Small left frontal subdural hematoma is stable in size and attenuation from 2014.  4. ASPECTS is 10    06/29/2017 1. Evolving large LEFT MCA territory infarct (LEFT basal ganglia, LEFT temporoparietal lobes) with petechial hemorrhage, no hemorrhagic conversion.  2. Acute small volume LEFT cerebrum extra-axial blood products.  3. Chronic changes include old small LEFT frontal subdural hematoma and small LEFT frontal encephalomalacia.    Transthoracic echocardiogram 06/28/2017 Study Conclusions - Left ventricle: The cavity size was normal. There was mild   concentric hypertrophy. Systolic function was normal. The    estimated ejection fraction was in the range of 50% to 55%.   Hypokinesis of the apical inferior and akinesis of the basal to   mid inferior myocardium. Doppler parameters are consistent with   indeterminate ventricular filling pressure. - Aortic valve: Valve mobility was restricted. There was mild   stenosis. There was mild regurgitation. Peak velocity (S): 300   cm/s. Mean gradient (S): 19 mm Hg. Valve area (VTI): 1.64 cm^2.   Valve area (Vmax): 1.49 cm^2. Valve area (Vmean): 1.39 cm^2.   Regurgitation pressure half-time: 723 ms. - Mitral valve: Transvalvular velocity was within the normal range.   There was no evidence for stenosis. There was no regurgitation. - Left atrium: The atrium was severely dilated. - Right ventricle: The cavity size was normal. Wall thickness was   normal. Systolic function was normal. - Tricuspid valve: There was mild regurgitation. - Pulmonary arteries: Systolic pressure was within the normal   range. PA peak pressure: 27 mm Hg (S)  Ct Head Wo Contrast 07/04/2017 IMPRESSION:  1. Unchanged extent of cytotoxic edema within the posterior left MCA territory and left basal ganglia with mass effect on the left lateral ventricle but no midline shift or hydrocephalus.  2. Decreased number of foci petechial hemorrhage. No hemorrhagic conversion/parenchymal hematoma.   LE venous doppler - There is no obvious evidence of deep or superficial vein  thrombosis involving the right and left lower extremities. All clearly visualized vessels appear patent and compressible. There is no evidence of Baker's cysts bilaterally.  Dg Chest Port 1 View 07/05/2017 IMPRESSION: No change in left basilar changes.   US renal  Mild bilateral hydronephrosis.  CXR  07/13/2017 Continued left lower lobe atelectasis or infiltrate with small left Effusion. Cardiomegaly, vascular congestion.  07/15/2017 IMPRESSION: Stable left basilar opacity as described above.     PHYSICAL EXAM  Temp:  [98.6 F (37 C)-100.7 F (38.2 C)] 99.8 F (37.7 C) (07/30 1429) Pulse Rate:  [71-114] 103 (07/30 1429) Resp:  [16-23] 20 (07/30 1429) BP: (108-129)/(44-67) 117/44 (07/30 1429) SpO2:  [95 %-98 %] 97 % (07/30 1429) Weight:  [191 lb 12.8 oz (87 kg)] 191 lb 12.8 oz (87 kg) (07/30 0437)  General - frail elderly Caucasian male Ophthalmologic - Fundi not visualized due to noncooperation.  Cardiovascular - irregularly irregular heart rate and rhythm with RVR.  Extremities - right groin wound dress dry and clean.   Neuro -   awake alert, on nasal canula , eyes open, can visually track. Blinks to threat on the left but not on the right.  Global aphasia except able to close eyes on command, intelligible words. PERRL. Right facial droop, tongue midline in mouth. Spontaneous  movement of LUE and LLE, at least 3/5 LUE and 2+/5 LLE. RUE 0/5 and RLE 2/5. DTR 1+ and right positive babinski. Sensation, coordination and gait not tested.   ASSESSMENT/PLAN Robert Keith is a 83 y.o. male with history of prostate cancer, CAD, history of GI bleed, DM2, essential tremor, hypertension, history of atrial fibrillation and subdural hematoma, s/p craniectomy and clot evacuation who presented with right facial droop, dense right hemiplegia global aphasia, and left gaze deviation. He recevied tPA at 1507 on 06/26/2017.  CTA Occluded left M1 -> IR thrombectomy TICI 3 reperfusion with Dr.  Estanislado Pandy.  Stroke: left MCA infarct due to left M1 occlusion s/p thrombectomy due to afib not on AC  Resultant  aphasia and right hemiplegia  CT head:  Focal hyperdensity in left distal M1 may represent thrombus. Small left frontal subdural hematoma is stable in size and attenuation from 2014.   MRI head: left MCA infarct. Petechial blood product deposition without focal hematoma.  CT Angio Head: Emergent large vessel occlusion of the left M1 segment. Dense calcifications at the carotid bifurcations bilaterally without significant stenoses  2D Echo - EF 50-55%. Left atrium severely dilated. No cardiac source of emboli identified.  LDL 78  HgbA1c 6.8  SCDs for VTE prophylaxis Diet NPO time specified  No antithrombotic prior to admission, now on ASA 325mg . Pt deemed not candidate for anticoagulation per cardiology.  Patient counseled to be compliant with his antithrombotic medications  Ongoing aggressive stroke risk factor management  Therapy recommendations:  SNF  Disposition: pending  Leukocytosis with low grade fever  WBC 11.1 -> 16.8 (temp 100.7)  CXR stable  UA pending  Right groin wound no sign of infection  Blood culture pending  Empiric zosyn treatment  Right femoral pseudoaneurysm with massive thigh hematoma s/p IR due to PFA tear   VVS on board, appreciate recs  Requiring surgical suturing and clot evacuation  Dressing dry and clean  JP drain removed   Afib not on [facility]  Not on AC at baseline due to previous SDH s/p crani and evacuation  Pt deemed not anticoagulation candidate per cardiology  On metoprolol for RVR  Rate currently 90-110  Cardiology on board and recs appreciated  AKI, resolved  Cre 0.90->1.99->2.93->4.5->3.2->1.3->0.7->0.74->0.77  Due to urinary retention  Nephrology help appreciated  Foley catheter inserted 07/07/17  Hypernatremia  Na 146->150->148->142->141->139->138  increase NS to 50cc/h due to fever and  continue free water  BMP monitoring  On TF @ 40  Hyperlipidemia  Home meds: pravastatin 40mg  PO daily  LDL 78, goal < 70  Statin resumed  Continue statin at discharge  Diabetes  HgbA1c 6.8, goal < 7.0  Controlled  Hyperglycemia under control now  Resumed home medications  SSI  Anemia, improved  Due to acute blood loss  Hb 9.0->8.2->7.8->7.8->8.7->8.7->9.3->9.0->9.6-> 9.3->9.4  On ASA   Other Stroke Risk Factors  Advanced age  Family hx stroke (Mother)  CAD  Other Active Problems  Thrombocytopenia, resolved  hypokalemia, resolved  GI bleed history  Hospital day # 19  This patient is medically critical due to fever with leukocytosis, dysphagia s/p PEG, stroke and right groin hematoma needed evacuation. Patient is at high risk for medical and neurological worsening. This patient's care requires constant monitoring of vital signs, hemodynamics, respiratory and cardiac monitoring, review of multiple databases, neurological assessment, discussion with family, other specialists and medical decision making of high complexity.    Rosalin Hawking, MD PhD Stroke Neurology 07/15/2017 2:56 PM  To contact Stroke Continuity provider, please refer to [email or web address] After hours, contact General Neurology

## 2017-07-15 NOTE — Progress Notes (Signed)
  Speech Language Pathology Treatment: Dysphagia;Cognitive-Linquistic  Patient Details Name: Robert Keith MRN: [id number] DOB: 31-Dec-1932 Today's Date: 07/15/2017 Time: 5361-4431 SLP Time Calculation (min) (ACUTE ONLY): 22 min  Assessment / Plan / Recommendation Clinical Impression  Pt was seen for skilled co-tx to facilitate arousal and safe positioning to optimize swallowing and cognitive-linguistic abilities. Pt has had a fever today and has been more lethargic. SLP provided Max cues for initiation and sustained attention to self-care tasks. He followed commands to open/close his eyes and to raise his left hand (slightly) x1. Pt consumed two ice chips before he became too fatigued. The first one spilled anteriorly from his mouth with pt showing awareness but not making attempts to self-correct. The second one was suctioned from his mouth. SLP will continue to follow. Continue to recommend CIR at this time.    HPI HPI: Pt is an 83 y.o.malewith history of prostate cancer, CAD, history of GI bleed, DM2, essential tremor, hypertension, history of atrial fibrillation and subdural hematoma s/p craniectomy and clot evacuation,who presented with right facial droop, dense right hemiplegia global aphasia, and left gaze deviation. He recevied tPA and went to IR for thrombectomy. He was intubated 7/11-7/16. Pt did have a prior MBS in September 2016 with swallowing WFL.       SLP Plan  Continue with current plan of care       Recommendations  Diet recommendations: NPO Medication Administration: Via alternative means                Oral Care Recommendations: Oral care QID Follow up Recommendations: Inpatient Rehab SLP Visit Diagnosis: Dysphagia, oropharyngeal phase (R13.12);Aphasia (R47.01) Plan: Continue with current plan of care       GO                Germain Osgood 07/15/2017, 3:00 PM  Germain Osgood, M.A. CCC-SLP [phone]

## 2017-07-15 NOTE — Progress Notes (Signed)
Physical Therapy Treatment Patient Details Name: Robert Keith MRN: [id number] DOB: [birth year] Today's Date: 07/15/2017    History of Present Illness Pt is an 83 y.o. male who presented to the ED with global aphasia, Rt hemiplegia, and L gaze deviation. Pt with L M1 occlusion s/p tPA and mechanical thrombectomy with revascularization.  He was intubated 7/11-7/16. Pt had significant Rt thigh hematoma with Rt femoral pseudoaneurysm following sheath removal and underwent Rt femoral artery repair on 7/12. Pt transferred back to ICU on 07/04/17. PMHx: prostate cancer, CAD, history of GI bleed, DM2, essential tremor, hypertension, history of atrial fibrillation and subdural hematoma s/p craniectomy and clot evacuation.    PT Comments    Patient seen in conjunction with SLP therapist for EOB arousal attempts and cognitive/swallow by SLP. At this time, patient remains lethargic and demonstrates minimal ability to engage in session. Family present at bedside throughout session. OF NOTE: patient with elevated temperature today and increased fatigue per family. Will continue to see and progress as tolerated.    Follow Up Recommendations  CIR     Equipment Recommendations  Wheelchair (measurements PT);Wheelchair cushion (measurements PT);Other (comment)    Recommendations for Other Services Rehab consult     Precautions / Restrictions Precautions Precautions: Fall Precaution Comments: rt groin incision Restrictions Weight Bearing Restrictions: No    Mobility  Bed Mobility Overal bed mobility: Needs Assistance Bed Mobility: Supine to Sit;Sit to Supine     Supine to sit: +2 for physical assistance;Total assist Sit to supine: Total assist;+2 for physical assistance   General bed mobility comments: Total assist for all aspects of mobility at this time  Transfers                 General transfer comment: unable to tolerate OOb at this time  Ambulation/Gait             General  Gait Details: Unable at this time   Stairs            Wheelchair Mobility    Modified Rankin (Stroke Patients Only) Modified Rankin (Stroke Patients Only) Pre-Morbid Rankin Score: No symptoms Modified Rankin: Severe disability     Balance Overall balance assessment: Needs assistance Sitting-balance support: Feet supported;Bilateral upper extremity supported Sitting balance-Leahy Scale: Poor Sitting balance - Comments: Sat EOB for >10 minutes, lethargic and max assist for sitting balance                                     Cognition Arousal/Alertness: Lethargic Behavior During Therapy:  (some smiles and smirks) Overall Cognitive Status: Difficult to assess Area of Impairment: Attention;Following commands;Safety/judgement;Problem solving                   Current Attention Level: Sustained   Following Commands: Follows one step commands inconsistently;Follows one step commands with increased time Safety/Judgement: Decreased awareness of safety;Decreased awareness of deficits   Problem Solving: Slow processing;Decreased initiation;Difficulty sequencing;Requires verbal cues;Requires tactile cues General Comments: difficulty following commands at this time, increased cues. intermittent arousal with lethargy during session      Exercises Other Exercises Other Exercises: Tolerated EOB activity for arousal initiation in conjunction with SLP for swallow attempts. Patient limited with all function today.     General Comments        Pertinent Vitals/Pain Faces Pain Scale: No hurt Pain Location: L middle finger with ROM Pain Descriptors / Indicators: Grimacing  Home Living                      Prior Function            PT Goals (current goals can now be found in the care plan section) Acute Rehab PT Goals Patient Stated Goal: unable to state PT Goal Formulation: Patient unable to participate in goal setting Time For Goal  Achievement: 07/22/17 Potential to Achieve Goals: Poor Progress towards PT goals: Not progressing toward goals - comment    Frequency    Min 3X/week      PT Plan Current plan remains appropriate    Co-evaluation PT/OT/SLP Co-Evaluation/Treatment: Yes Reason for Co-Treatment: Complexity of the patient's impairments (multi-system involvement);Necessary to address cognition/behavior during functional activity     SLP goals addressed during session: Swallowing;Cognition    AM-PAC PT "6 Clicks" Daily Activity  Outcome Measure  Difficulty turning over in bed (including adjusting bedclothes, sheets and blankets)?: Total Difficulty moving from lying on back to sitting on the side of the bed? : Total Difficulty sitting down on and standing up from a chair with arms (e.g., wheelchair, bedside commode, etc,.)?: Total Help needed moving to and from a bed to chair (including a wheelchair)?: Total Help needed walking in hospital room?: Total Help needed climbing 3-5 steps with a railing? : Total 6 Click Score: 6    End of Session   Activity Tolerance: Patient limited by fatigue Patient left: in bed;with call bell/phone within reach;with family/visitor present Nurse Communication: Mobility status PT Visit Diagnosis: Muscle weakness (generalized) (M62.81);Hemiplegia and hemiparesis;Other abnormalities of gait and mobility (R26.89) Hemiplegia - Right/Left: Right Hemiplegia - dominant/non-dominant: Dominant Hemiplegia - caused by: Cerebral infarction     Time: 4158-3094 PT Time Calculation (min) (ACUTE ONLY): 18 min  Charges:  $Therapeutic Activity: 8-22 mins                    G Codes:       Alben Deeds, PT DPT  Board Certified Neurologic Specialist [phone]    Duncan Dull 07/15/2017, 1:32 PM

## 2017-07-15 NOTE — Progress Notes (Signed)
Pharmacy Antibiotic Note  Robert Keith is a 83 y.o. male admitted on 06/26/2017 as a code stroke,  Left MCA infarct. Today 07/15/17, pharmacy has been consulted for  Zosyn dosing for Fever of undetermined orgin.  Tc 100.7, Tmax 100.7 Leukocytosis , WBC elevated to 16.8K  Recent AKI this admit/resolved.  Scr peaked at 4.5, now SCr 0.77. CrCl 77 ml/min I/O = 3009/3050   Plan: Zosyn 3.375 g IV q8h (extended infusion) Monitor clinical progress, renal function, culture results.  Height: 5' 9"  (175.3 cm) Weight: 191 lb 12.8 oz (87 kg) IBW/kg (Calculated) : 70.7  Temp (24hrs), Avg:99.3 F (37.4 C), Min:98.5 F (36.9 C), Max:100.7 F (38.2 C)   Recent Labs Lab 07/08/17 1403 07/11/17 0942 07/13/17 0327 07/13/17 0640 07/14/17 0356 07/15/17 0654 07/15/17 0919  WBC  --  10.7*  --  9.5 11.1*  --  16.8*  CREATININE 1.32* 0.86 0.70  --  0.74 0.77  --     Estimated Creatinine Clearance: 77.7 mL/min (by C-G formula based on SCr of 0.77 mg/dL).    Allergies  Allergen Reactions  . Metformin And Related Diarrhea  . Tape Other (See Comments)    PLEASE USE COBAN WRAP; PATIENT'S SKIN IS THIN AND WILL TEAR EASILY!!  . Bupropion Other (See Comments)    Unknown  . Fenofibrate Micronized Other (See Comments)    Unknown  . Gemfibrozil Other (See Comments)    Unknown  . Niacin Other (See Comments)    Impotence   . Phenobarbital Other (See Comments)    Confusion   . Simvastatin Other (See Comments)    Myalgia    Antimicrobials this admission: Zosyn 7/30>> 7/15 Vanc>>>7/17 7/15 Ceftaz >>>7/23  Dose adjustments this admission: n/a  Microbiology results:  7/30 BCx x2: sent 7/30 Sputum Cx: sent  7/11 MRSA PCR Negative 7/14: BCx: CONS in 1/4, BCID staph aureus detected,   Met resist NOT detected    3/4 negative 7/15 Urine: negative  7/15 Respiratory: nml flora    Thank you for allowing pharmacy to be a part of this patient's care.  Nicole Cella, RPh Clinical Pharmacist Pager:  [phone]-4P [phone]-10P [phone] [place] [phone] 07/15/2017 10:33 AM

## 2017-07-16 ENCOUNTER — Encounter (HOSPITAL_COMMUNITY): Payer: Self-pay | Admitting: General Surgery

## 2017-07-16 LAB — GLUCOSE, CAPILLARY
GLUCOSE-CAPILLARY: 179 mg/dL — AB (ref 65–99)
GLUCOSE-CAPILLARY: 140 mg/dL — AB (ref 65–99)
Glucose-Capillary: 135 mg/dL — ABNORMAL HIGH (ref 65–99)
Glucose-Capillary: 132 mg/dL — ABNORMAL HIGH (ref 65–99)
Glucose-Capillary: 137 mg/dL — ABNORMAL HIGH (ref 65–99)
Glucose-Capillary: 108 mg/dL — ABNORMAL HIGH (ref 65–99)

## 2017-07-16 LAB — BASIC METABOLIC PANEL
ANION GAP: 6 (ref 5–15)
BUN: 24 mg/dL — ABNORMAL HIGH (ref 6–20)
CHLORIDE: 108 mmol/L (ref 101–111)
CO2: 22 mmol/L (ref 22–32)
Calcium: 7.7 mg/dL — ABNORMAL LOW (ref 8.9–10.3)
Creatinine, Ser: 0.79 mg/dL (ref 0.61–1.24)
GFR calc Af Amer: >60 mL/min (ref >60)
GLUCOSE: 151 mg/dL — AB (ref 65–99)
Potassium: 3.4 mmol/L — ABNORMAL LOW (ref 3.5–5.1)
Sodium: 136 mmol/L (ref 135–145)

## 2017-07-16 LAB — CBC
HEMATOCRIT: 28.5 % — AB (ref 39.0–52.0)
HEMOGLOBIN: 8.7 g/dL — AB (ref 13.0–17.0)
MCH: 29.7 pg (ref 26.0–34.0)
MCHC: 30.5 g/dL (ref 30.0–36.0)
MCV: 97.3 fL (ref 78.0–100.0)
PLATELETS: 269 10*3/uL (ref 150–400)
RBC: 2.93 MIL/uL — AB (ref 4.22–5.81)
RDW: 18.6 % — ABNORMAL HIGH (ref 11.5–15.5)
WBC: 13.1 10*3/uL — AB (ref 4.0–10.5)

## 2017-07-16 MED ORDER — SODIUM CHLORIDE 0.9 % IV SOLN
250.0000 mL | INTRAVENOUS | Status: DC
  Administered 2017-07-16: 250 mL via INTRAVENOUS

## 2017-07-16 NOTE — Progress Notes (Addendum)
Occupational Therapy Treatment Patient Details Name: Robert Keith MRN: [id number] DOB: 1933-07-25 Today's Date: 07/16/2017    History of present illness Pt is an 83 y.o. male who presented to the ED with global aphasia, Rt hemiplegia, and L gaze deviation. Pt with L M1 occlusion s/p tPA and mechanical thrombectomy with revascularization.  He was intubated 7/11-7/16. Pt had significant Rt thigh hematoma with Rt femoral pseudoaneurysm following sheath removal and underwent Rt femoral artery repair on 7/12. Pt transferred back to ICU on 07/04/17. PMHx: prostate cancer, CAD, history of GI bleed, DM2, essential tremor, hypertension, history of atrial fibrillation and subdural hematoma s/p craniectomy and clot evacuation.   OT comments  Pt progressing toward OT goals and updated care plan section as updated goals due this date. Pt was able to complete grooming tasks with min assist this session requiring assistance for initiation of task. He demonstrated improved ability to cross midline gaze to the R with multimodal cues with approximately 50% accuracy this session. Noted one finger sublux of R shoulder and facilitated weight bearing and appropriate positioning to address this. Note plan to D/C to SNF tomorrow and feel that this is an appropriate D/C recommendation as pt does fatigue during activity and will likely need longer duration of rehabilitation services.    Follow Up Recommendations  Supervision/Assistance - 24 hour;SNF    Equipment Recommendations  Other (comment) (TBD at next venue of care)    Recommendations for Other Services      Precautions / Restrictions Precautions Precautions: Fall Precaution Comments: rt groin incision Restrictions Weight Bearing Restrictions: No       Mobility Bed Mobility Overal bed mobility: Needs Assistance Bed Mobility: Supine to Sit;Sit to Supine Rolling: Max assist Sidelying to sit: Max assist;+2 for physical assistance   Sit to supine: Max  assist;+2 for physical assistance   General bed mobility comments: Max assist +2 for all aspects of bed mobility this session.   Transfers                      Balance Overall balance assessment: Needs assistance Sitting-balance support: Feet supported;Bilateral upper extremity supported Sitting balance-Leahy Scale: Poor Sitting balance - Comments: Fluctuating mod-max assist. Pt able to engage core to attempt to correct posture with max assist                                   ADL either performed or assessed with clinical judgement   ADL Overall ADL's : Needs assistance/impaired     Grooming: Wash/dry face;Minimal assistance Grooming Details (indicate cue type and reason): Hand over hand assist to initiate task. Once initiated pt able to wash face well.                                General ADL Comments: With hand over hand assist to initiate, pt was able to apply lotion to R UE to facilitate improved attention to R UE.      Vision   Additional Comments: Family sitting on R side to encourage attention to R visual field. Pt able to cross midline gaze with multimodal cues approximately 50% of the session.    Perception     Praxis      Cognition Arousal/Alertness: Lethargic Behavior During Therapy: Flat affect (smiles at times) Overall Cognitive Status: Difficult to assess Area of Impairment:  Attention;Following commands;Safety/judgement;Problem solving                   Current Attention Level: Sustained   Following Commands: Follows one step commands inconsistently;Follows one step commands with increased time Safety/Judgement: Decreased awareness of safety;Decreased awareness of deficits   Problem Solving: Slow processing;Decreased initiation;Difficulty sequencing;Requires verbal cues;Requires tactile cues General Comments: Requires significantly increased time to follow commands. Needs simple one-step commands this session  with multimodal cues.         Exercises Other Exercises Other Exercises: Facilitated weight bearing through R UE during functional reaching tasks.    Shoulder Instructions       General Comments      Pertinent Vitals/ Pain       Pain Assessment: Faces Pain Score: 0-No pain Faces Pain Scale: No hurt  Home Living                                          Prior Functioning/Environment              Frequency  Min 3X/week        Progress Toward Goals  OT Goals(current goals can now be found in the care plan section)  Progress towards OT goals:  (goals remain approrpiate; updated timeline)  Acute Rehab OT Goals Patient Stated Goal: unable to state OT Goal Formulation: Patient unable to participate in goal setting Time For Goal Achievement: 07/30/17 Potential to Achieve Goals: Fair ADL Goals Pt Will Perform Grooming: with mod assist;sitting Additional ADL Goal #1: Pt will complete bed mobility in preparation for ADL participation with overall moderate assistance.  Additional ADL Goal #2: Pt will attend to R visual field with no more than 1 VC during seated grooming tasks.  Additional ADL Goal #3: Pt will tolerate sitting at EOB during ADL participation with overall min assist for balance and stable vital signs.   Plan Discharge plan needs to be updated    Co-evaluation                 AM-PAC PT "6 Clicks" Daily Activity     Outcome Measure   Help from another person eating meals?: Total Help from another person taking care of personal grooming?: A Lot Help from another person toileting, which includes using toliet, bedpan, or urinal?: Total Help from another person bathing (including washing, rinsing, drying)?: Total Help from another person to put on and taking off regular upper body clothing?: Total Help from another person to put on and taking off regular lower body clothing?: Total 6 Click Score: 7    End of Session Equipment  Utilized During Treatment: Gait belt  OT Visit Diagnosis: Other abnormalities of gait and mobility (R26.89);Hemiplegia and hemiparesis;Cognitive communication deficit (R41.841);Unsteadiness on feet (R26.81) Symptoms and signs involving cognitive functions: Cerebral infarction Hemiplegia - Right/Left: Right Hemiplegia - dominant/non-dominant: Dominant Hemiplegia - caused by: Cerebral infarction   Activity Tolerance Patient tolerated treatment well   Patient Left in bed;with call bell/phone within reach;with family/visitor present   Nurse Communication Mobility status        Time: 0762-2633 OT Time Calculation (min): 28 min  Charges: OT General Charges $OT Visit: 1 Procedure OT Treatments $Self Care/Home Management : 23-37 mins  Norman Herrlich, MS OTR/L  Pager: [place] A Maliya Marich 07/16/2017, 5:46 PM

## 2017-07-16 NOTE — Care Management Note (Signed)
Case Management Note  Patient Details  Name: Robert Keith MRN: [id number] Date of Birth: Nov 01, 1933  Subjective/Objective:                    Action/Plan: Plan is for SNF when patient is medically ready. CM following.   Expected Discharge Date:                  Expected Discharge Plan:  IP Rehab Facility  In-House Referral:  Clinical Social Work  Discharge planning Services  CM Consult  Post Acute Care Choice:    Choice offered to:     DME Arranged:    DME Agency:     HH Arranged:    [place] Agency:     Status of Service:  In process, will continue to follow  If discussed at Long Length of Stay Meetings, dates discussed:    Additional Comments:  Pollie Friar, RN 07/16/2017, 1:52 PM

## 2017-07-16 NOTE — Progress Notes (Signed)
   Daily Progress Note   Assessment/Planning:   S/p R thigh hematoma evacuation, R PFA repair   R groin incision appears healed  Pt can follow up as needed   Subjective  - 4 Days Post-Op   No events overnight, no drainage, wound healed   Objective   Vitals:   07/15/17 1714 07/15/17 2101 07/16/17 0154 07/16/17 0605  BP: (!) 114/56 (!) 118/48 110/64 (!) 108/59  Pulse: (!) 102 71 (!) 103 81  Resp: 20 18 18 18   Temp: 99.3 F (37.4 C) 98.8 F (37.1 C) 97.9 F (36.6 C) 98 F (36.7 C)  TempSrc: Axillary Axillary Oral Axillary  SpO2: 99% 95% 97% 98%  Weight:      Height:         Intake/Output Summary (Last 24 hours) at 07/16/17 0800 Last data filed at 07/16/17 0200  Gross per 24 hour  Intake          1788.33 ml  Output             1000 ml  Net           788.33 ml    VASC R groin healed, R foot viable,     Laboratory   CBC CBC Latest Ref Rng & Units 07/16/2017 07/15/2017 07/14/2017  WBC 4.0 - 10.5 K/uL 13.1(H) 16.8(H) 11.1(H)  Hemoglobin 13.0 - 17.0 g/dL 8.7(L) 9.4(L) 9.3(L)  Hematocrit 39.0 - 52.0 % 28.5(L) 30.8(L) 30.5(L)  Platelets 150 - 400 K/uL 269 311 339    BMET    Component Value Date/Time   NA 136 07/16/2017 0156   NA 142 12/22/2014 1430   K 3.4 (L) 07/16/2017 0156   K 4.3 12/22/2014 1430   CL 108 07/16/2017 0156   CL 108 (H) 12/22/2014 1430   CO2 22 07/16/2017 0156   CO2 27 12/22/2014 1430   GLUCOSE 151 (H) 07/16/2017 0156   GLUCOSE 119 (H) 12/22/2014 1430   BUN 24 (H) 07/16/2017 0156   BUN 20 (H) 12/22/2014 1430   CREATININE 0.79 07/16/2017 0156   CREATININE 1.13 12/22/2014 1430   CALCIUM 7.7 (L) 07/16/2017 0156   CALCIUM 8.8 12/22/2014 1430   GFRNONAA >60 07/16/2017 0156   GFRNONAA >60 12/22/2014 1430   GFRAA >60 07/16/2017 0156   GFRAA >60 12/22/2014 [place], MD, FACS [facility]: [phone] Pager: [phone]  07/16/2017, 8:00 AM

## 2017-07-16 NOTE — Progress Notes (Signed)
Nutrition Follow-up  DOCUMENTATION CODES:   Not applicable  INTERVENTION:   -Recommend continuing current TF regimen   NUTRITION DIAGNOSIS:   Inadequate oral intake related to inability to eat as evidenced by NPO status.  Being addressed via TF  GOAL:   Patient will meet greater than or equal to 90% of their needs  Met  MONITOR:   TF tolerance, Weight trends, I & O's, Diet advancement  REASON FOR ASSESSMENT:   Consult Enteral/tube feeding initiation and management  ASSESSMENT:   83 year old male with past medical history of afib (not on NOAC 2/2 GI bleed), HTN, prior SDH in 2014 that required evacuation with residual small chronic SDH), DM, CAD, tremor, H. Pylori infection, and prostate cancer who presented 7/11 from home with witnessed onset of right facial droop, right hemiplegia and aphasia at 1330.    Osmolite 1.2 @ 60 ml/hr with Pro-Stat 30 mL BID with 300 mL free water every 6 hours via G-tube  Weight relatively stable since admission (up and down but overall stable). Weight of 189 pounds on admission, current wt of 191 pounds.   Labs: reviewed Meds: reviewed  Diet Order:  Diet NPO time specified  Skin:  Reviewed, no issues  Last BM:  7/27  Height:   Ht Readings from Last 1 Encounters:  07/12/17 _0  (1.753 m)    Weight:   Wt Readings from Last 1 Encounters:  07/15/17 191 lb 12.8 oz (87 kg)    Ideal Body Weight:  72.7 kg  BMI:  Body mass index is 28.32 kg/m.  Estimated Nutritional Needs:   Kcal:  1900-2100  Protein:  105-115 grams  Fluid:  1.9-2.1 L  EDUCATION NEEDS:   No education needs identified at this time  [place], [place], LDN [phone] Pager  [phone] Weekend/On-Call Pager

## 2017-07-16 NOTE — Progress Notes (Signed)
Pt turned and repositioned frequently throughout shift. Dressings noted changed, dry and intact. Will continue to monitor.

## 2017-07-16 NOTE — Progress Notes (Signed)
STROKE TEAM PROGRESS NOTE   SUBJECTIVE (INTERVAL HISTORY) Two daughters are at the bedside. Pt awake and alert and much improved from yesterday. Afebrile over night, leukocytosis improved. On zosyn. Plan for SNF tomorrow.  OBJECTIVE Temp:  [97.9 F (36.6 C)-99.1 F (37.3 C)] 98.1 F (36.7 C) (07/31 1447) Pulse Rate:  [71-103] 91 (07/31 1447) Cardiac Rhythm: Atrial fibrillation (07/31 1230) Resp:  [18-19] 19 (07/31 1447) BP: (106-118)/(48-64) 106/54 (07/31 1447) SpO2:  [95 %-98 %] 97 % (07/31 1447)  CBC:   Recent Labs Lab 07/15/17 0919 07/16/17 0156  WBC 16.8* 13.1*  HGB 9.4* 8.7*  HCT 30.8* 28.5*  MCV 96.9 97.3  PLT 311 160    Basic Metabolic Panel:   Recent Labs Lab 07/15/17 0654 07/16/17 0156  NA 138 136  K 4.4 3.4*  CL 110 108  CO2 21* 22  GLUCOSE 133* 151*  BUN 24* 24*  CREATININE 0.77 0.79  CALCIUM 7.9* 7.7*    Lipid Panel:     Component Value Date/Time   CHOL 115 06/27/2017 0500   TRIG 86 06/27/2017 0500   HDL 20 (L) 06/27/2017 0500   CHOLHDL 5.8 06/27/2017 0500   VLDL 17 06/27/2017 0500   LDLCALC 78 06/27/2017 0500   HgbA1c:  Lab Results  Component Value Date   HGBA1C 6.8 (H) 06/27/2017   Urine Drug Screen: No results found for: LABOPIA, COCAINSCRNUR, LABBENZ, AMPHETMU, THCU, LABBARB  Alcohol Level No results found for: [place] I have personally reviewed the radiological images below and agree with the radiology interpretations.  Ct Abdomen Pelvis Wo Contrast 06/27/2017 1. Hematoma within the proximal RIGHT anterior thigh is incompletely imaged. Hematoma likely spontaneous related to anticoagulation. Recommend clinical correlation with extent of hematoma in the RIGHT thigh.  2. No intraperitoneal or retroperitoneal hemorrhage.  3. Extensive diverticulosis without diverticulitis.  4.  Aortic Atherosclerosis (ICD10-I70.0).  5. Small bilateral pleural effusions.   Ct Angio Head and neck W and Wo Contrast 06/26/2017 1. Emergent large  vessel occlusion of the left M1 segment.  2. There is some reconstitution of anterior left MCA branch vessels from pial collaterals.  3. Dense calcifications at the carotid bifurcations bilaterally without significant stenoses relative to the more distal vessels.  4. Moderate narrowing of the proximal posterior cerebral arteries bilaterally, right greater than left.  5. Calcifications at great vessel origins along the arch without significant stenosis. 6. Hypoplastic left vertebral artery.  7. Multilevel cervical spinal spondylosis.  8. Emphysema.  9. Prior granulomatous disease in the right upper lobe.  Cerebral angiogram and thrombectomy - Dr. Estanislado Pandy 06/26/2017 Status post endovascular complete revascularization of occluded left middle cerebral artery with 1 pass with Solitaire 4 mm x 40 mm FR retrieval device and a 6 [place] intermediary guide catheter achieving a TICI 3 reperfusion.  Mr Brain Wo Contrast 06/27/2017 Completed acute infarction affecting the majority of the inferior division left MCA distribution. This affects the left temporal lobe and posterior frontal lobe with patchy involvement in the parietal region.  Infarction of the caudate and putamen as well.  Petechial blood product deposition without focal hematoma.  Swelling but no shift at this time.  Ct Cerebral Perfusion W Contrast 06/26/2017 Left M1 emergent large vessel occlusion with associated acute left MCA territory infarct.  Core infarct is estimated at 49 mL.  The tissue at risk is estimated at 182 mL.   Ct Head Code Stroke W/o Cm 06/26/2017 1. Focal hyperdensity in left distal M1 may represent thrombus.  2.  No vascular territory infarct identified at this time.  3. Small left frontal subdural hematoma is stable in size and attenuation from 2014.  4. ASPECTS is 10   06/29/2017 1. Evolving large LEFT MCA territory infarct (LEFT basal ganglia, LEFT temporoparietal lobes) with petechial hemorrhage, no  hemorrhagic conversion.  2. Acute small volume LEFT cerebrum extra-axial blood products.  3. Chronic changes include old small LEFT frontal subdural hematoma and small LEFT frontal encephalomalacia.    Transthoracic echocardiogram 06/28/2017 Study Conclusions - Left ventricle: The cavity size was normal. There was mild   concentric hypertrophy. Systolic function was normal. The    estimated ejection fraction was in the range of 50% to 55%.   Hypokinesis of the apical inferior and akinesis of the basal to   mid inferior myocardium. Doppler parameters are consistent with   indeterminate ventricular filling pressure. - Aortic valve: Valve mobility was restricted. There was mild   stenosis. There was mild regurgitation. Peak velocity (S): 300   cm/s. Mean gradient (S): 19 mm Hg. Valve area (VTI): 1.64 cm^2.   Valve area (Vmax): 1.49 cm^2. Valve area (Vmean): 1.39 cm^2.   Regurgitation pressure half-time: 723 ms. - Mitral valve: Transvalvular velocity was within the normal range.   There was no evidence for stenosis. There was no regurgitation. - Left atrium: The atrium was severely dilated. - Right ventricle: The cavity size was normal. Wall thickness was   normal. Systolic function was normal. - Tricuspid valve: There was mild regurgitation. - Pulmonary arteries: Systolic pressure was within the normal   range. PA peak pressure: 27 mm Hg (S)  Ct Head Wo Contrast 07/04/2017 IMPRESSION:  1. Unchanged extent of cytotoxic edema within the posterior left MCA territory and left basal ganglia with mass effect on the left lateral ventricle but no midline shift or hydrocephalus.  2. Decreased number of foci petechial hemorrhage. No hemorrhagic conversion/parenchymal hematoma.   LE venous doppler - There is no obvious evidence of deep or superficial vein thrombosis involving the right and left lower extremities. All clearly visualized vessels appear patent and compressible. There is no evidence of  Baker's cysts bilaterally.  Dg Chest Port 1 View 07/05/2017 IMPRESSION: No change in left basilar changes.   US renal  Mild bilateral hydronephrosis.  CXR  07/13/2017 Continued left lower lobe atelectasis or infiltrate with small left Effusion. Cardiomegaly, vascular congestion.  07/15/2017 IMPRESSION: Stable left basilar opacity as described above.     PHYSICAL EXAM  Temp:  [97.9 F (36.6 C)-99.1 F (37.3 C)] 98.1 F (36.7 C) (07/31 1447) Pulse Rate:  [71-103] 91 (07/31 1447) Resp:  [18-19] 19 (07/31 1447) BP: (106-118)/(48-64) 106/54 (07/31 1447) SpO2:  [95 %-98 %] 97 % (07/31 1447)  General - frail elderly Caucasian male Ophthalmologic - Fundi not visualized due to noncooperation.  Cardiovascular - irregularly irregular heart rate and rhythm with RVR.  Extremities - right groin wound dress dry and clean.   Neuro -   awake alert, on nasal canula , eyes open, can visually track. Blinks to threat on the left but not on the right.  Global aphasia except able to close eyes on command, intelligible words. PERRL. Right facial droop, tongue midline in mouth. Spontaneous movement of LUE and LLE, at least 3/5 LUE and 2+/5 LLE. RUE 0/5 and RLE 2/5. DTR 1+ and right positive babinski. Sensation, coordination and gait not tested.   ASSESSMENT/PLAN [name] is a 83 y.o. male with history of prostate cancer, CAD, history of  GI bleed, DM2, essential tremor, hypertension, history of atrial fibrillation and subdural hematoma, s/p craniectomy and clot evacuation who presented with right facial droop, dense right hemiplegia global aphasia, and left gaze deviation. He recevied tPA at 1507 on 06/26/2017.  CTA Occluded left M1 -> IR thrombectomy TICI 3 reperfusion with Dr. Estanislado Pandy.  Stroke: left MCA infarct due to left M1 occlusion s/p thrombectomy due to afib not on AC  Resultant  aphasia and right hemiplegia  CT head:  Focal hyperdensity in left distal M1 may represent thrombus.  Small left frontal subdural hematoma is stable in size and attenuation from 2014.   MRI head: left MCA infarct. Petechial blood product deposition without focal hematoma.  CT Angio Head: Emergent large vessel occlusion of the left M1 segment. Dense calcifications at the carotid bifurcations bilaterally without significant stenoses  2D Echo - EF 50-55%. Left atrium severely dilated. No cardiac source of emboli identified.  LDL 78  HgbA1c 6.8  SCDs for VTE prophylaxis Diet NPO time specified  No antithrombotic prior to admission, now on ASA 325mg . Pt deemed not candidate for anticoagulation per cardiology.  Patient counseled to be compliant with his antithrombotic medications  Ongoing aggressive stroke risk factor management  Therapy recommendations:  SNF  Disposition: pending  Leukocytosis with low grade fever - improving  WBC 11.1 -> 16.8 (temp 100.7) -> 13.1(afebrile)  CXR stable  UA WBC TNTC  Urine culture pending  Right groin wound no sign of infection  Blood culture NGTD  Empiric zosyn treatment  Right femoral pseudoaneurysm with massive thigh hematoma s/p IR due to PFA tear   VVS on board, appreciate recs  Requiring surgical suturing and clot evacuation  Dressing dry and clean  JP drain removed   Afib not on [facility]  Not on AC at baseline due to previous SDH s/p crani and evacuation  Pt deemed not anticoagulation candidate per cardiology  On metoprolol   Rate controlled  Cardiology on board and recs appreciated  AKI, resolved  Cre 0.90->1.99->2.93->4.5->3.2->1.3->0.7->0.74->0.77->0.79  Due to urinary retention  Nephrology help appreciated  Foley catheter inserted 07/07/17  Hypernatremia  Na 146->150->148->142->141->139->138->136  BMP monitoring  On TF @ 60, NS @ 20 and free water @ 50  Hyperlipidemia  Home meds: pravastatin 40mg  PO daily  LDL 78, goal < 70  Statin resumed  Continue statin at discharge  Diabetes  HgbA1c 6.8,  goal < 7.0  Controlled  Hyperglycemia under control now  Resumed home medications  SSI  Anemia, improved  Due to acute blood loss  Hb 9.0->8.2->7.8->7.8->8.7->8.7->9.3->9.0->9.6-> 9.3->9.4->8.7  On ASA   Other Stroke Risk Factors  Advanced age  Family hx stroke (Mother)  CAD  Other Active Problems  Thrombocytopenia, resolved  hypokalemia, resolved  GI bleed history  Hospital day # 20  Robert Hawking, MD PhD Stroke Neurology 07/16/2017 5:27 PM   To contact Stroke Continuity provider, please refer to [email or web address] After hours, contact General Neurology

## 2017-07-16 NOTE — Progress Notes (Signed)
CSW met with patient's daughter and caregiver at bedside earlier today to check in on facility preference. Patient's daughter and caregiver indicated that they were going to tour some facilities today, and wanted information on whether any additional facilities had responded to the referral request. CSW provided updated list of offers to patient's daughter.  Patient's daughter called and asked if CSW had heard back from [facility] or [facility]. CSW put in a call to [facility]; they are looking at the referral and will call back tomorrow on whether they can accept. CSW awaiting information from [facility].  CSW will update tomorrow with facility choice and continue to follow to facilitate discharge when ready.  Elizabeth Paisley, LCSW Clinical Social Worker [phone]  

## 2017-07-16 NOTE — Progress Notes (Signed)
  Speech Language Pathology Treatment: Dysphagia;Cognitive-Linquistic  Patient Details Name: Robert Keith MRN: [id number] DOB: 11-10-1933 Today's Date: 07/16/2017 Time: 2263-3354 SLP Time Calculation (min) (ACUTE ONLY): 27 min  Assessment / Plan / Recommendation Clinical Impression  Pt is more alert today, although he still needs [employer] cueing to follow one-step commands. He is making some attempts to communicate today and produced the word "okay". He does not respond to yes/no questions consistently. He consumed ice chips with improved automaticity, needing only Min cues for oral transfer. One immediate cough was observed after his first ice chip, with no other overt s/s of aspiration. Tomorrow will be one week since his last MBS and per family he may also be d/c to SNF - therefore, recommend repeat MBS on next date to better assess current oropharyngeal swallowing.    HPI HPI: Pt is an 83 y.o.malewith history of prostate cancer, CAD, history of GI bleed, DM2, essential tremor, hypertension, history of atrial fibrillation and subdural hematoma s/p craniectomy and clot evacuation,who presented with right facial droop, dense right hemiplegia global aphasia, and left gaze deviation. He recevied tPA and went to IR for thrombectomy. He was intubated 7/11-7/16. Pt did have a prior MBS in September 2016 with swallowing WFL.       SLP Plan  MBS       Recommendations  Diet recommendations: NPO Medication Administration: Via alternative means                Oral Care Recommendations: Oral care QID Follow up Recommendations: Inpatient Rehab SLP Visit Diagnosis: Dysphagia, oropharyngeal phase (R13.12);Aphasia (R47.01) Plan: MBS       GO                Germain Osgood 07/16/2017, 4:20 PM  Germain Osgood, M.A. CCC-SLP [phone]

## 2017-07-17 ENCOUNTER — Inpatient Hospital Stay (HOSPITAL_COMMUNITY): Payer: Medicare Other

## 2017-07-17 LAB — CBC
HEMATOCRIT: 29.8 % — AB (ref 39.0–52.0)
Hemoglobin: 9 g/dL — ABNORMAL LOW (ref 13.0–17.0)
MCH: 29.4 pg (ref 26.0–34.0)
MCHC: 30.2 g/dL (ref 30.0–36.0)
MCV: 97.4 fL (ref 78.0–100.0)
PLATELETS: 265 10*3/uL (ref 150–400)
RBC: 3.06 MIL/uL — ABNORMAL LOW (ref 4.22–5.81)
RDW: 18.1 % — AB (ref 11.5–15.5)
WBC: 7.6 10*3/uL (ref 4.0–10.5)

## 2017-07-17 LAB — BASIC METABOLIC PANEL
ANION GAP: 7 (ref 5–15)
BUN: 27 mg/dL — AB (ref 6–20)
CALCIUM: 7.6 mg/dL — AB (ref 8.9–10.3)
CO2: 23 mmol/L (ref 22–32)
Chloride: 107 mmol/L (ref 101–111)
Creatinine, Ser: 0.79 mg/dL (ref 0.61–1.24)
GFR calc Af Amer: >60 mL/min (ref >60)
GLUCOSE: 151 mg/dL — AB (ref 65–99)
Potassium: 3.8 mmol/L (ref 3.5–5.1)
Sodium: 137 mmol/L (ref 135–145)

## 2017-07-17 LAB — GLUCOSE, CAPILLARY
Glucose-Capillary: 122 mg/dL — ABNORMAL HIGH (ref 65–99)
Glucose-Capillary: 151 mg/dL — ABNORMAL HIGH (ref 65–99)
Glucose-Capillary: 171 mg/dL — ABNORMAL HIGH (ref 65–99)

## 2017-07-17 MED ORDER — NYSTATIN 100000 UNIT/ML MT SUSP
5.0000 mL | Freq: Four times a day (QID) | OROMUCOSAL | 0 refills | Status: DC
Start: 2017-07-17 — End: 2017-12-23

## 2017-07-17 MED ORDER — AMLODIPINE BESYLATE 5 MG PO TABS: 5.0000 mg | ORAL_TABLET | Freq: Every day | ORAL | 0 refills | Status: DC

## 2017-07-17 MED ORDER — PRAVASTATIN SODIUM 40 MG PO TABS: 40.0000 mg | ORAL_TABLET | Freq: Every day | ORAL | 0 refills | Status: AC

## 2017-07-17 MED ORDER — FREE WATER: 300.0000 mL | Freq: Four times a day (QID) | 0 refills | Status: DC

## 2017-07-17 MED ORDER — RESOURCE THICKENUP CLEAR PO POWD
ORAL | Status: DC | PRN
  Filled 2017-07-17: qty 125

## 2017-07-17 MED ORDER — GLUCERNA 1.5 CAL PO LIQD: 1000.0000 mL | ORAL | 0 refills | Status: DC

## 2017-07-17 MED ORDER — PIOGLITAZONE HCL 15 MG PO TABS: 15.0000 mg | ORAL_TABLET | Freq: Every day | ORAL | 0 refills | Status: DC

## 2017-07-17 MED ORDER — METOPROLOL TARTRATE 100 MG PO TABS
100.0000 mg | ORAL_TABLET | Freq: Two times a day (BID) | ORAL | 0 refills | Status: DC
Start: 2017-07-17 — End: 2017-12-11

## 2017-07-17 MED ORDER — LORATADINE 10 MG PO TABS
10.0000 mg | ORAL_TABLET | Freq: Every day | ORAL | 0 refills | Status: AC
Start: 2017-07-17 — End: ?

## 2017-07-17 MED ORDER — AMOXICILLIN-POT CLAVULANATE 875-125 MG PO TABS: 1.0000 | ORAL_TABLET | Freq: Two times a day (BID) | ORAL | 0 refills | Status: AC

## 2017-07-17 MED ORDER — FAMOTIDINE 40 MG PO TABS: 40.0000 mg | ORAL_TABLET | Freq: Two times a day (BID) | ORAL | 1 refills | Status: AC

## 2017-07-17 MED ORDER — ASPIRIN 325 MG PO TABS
325.0000 mg | ORAL_TABLET | Freq: Every day | ORAL | 0 refills | Status: DC
Start: 2017-07-18 — End: 2017-12-23

## 2017-07-17 MED ORDER — OSMOLITE 1.2 CAL PO LIQD
1000.0000 mL | ORAL | 0 refills | Status: DC
Start: 2017-07-17 — End: 2017-07-17

## 2017-07-17 MED ORDER — SENNOSIDES-DOCUSATE SODIUM 8.6-50 MG PO TABS: 1.0000 | ORAL_TABLET | Freq: Every evening | ORAL | 0 refills | Status: AC | PRN

## 2017-07-17 MED ORDER — GLIPIZIDE 10 MG PO TABS
10.0000 mg | ORAL_TABLET | Freq: Two times a day (BID) | ORAL | 0 refills | Status: DC
Start: 2017-07-17 — End: 2017-12-12

## 2017-07-17 MED ORDER — PRO-STAT SUGAR FREE PO LIQD
30.0000 mL | Freq: Two times a day (BID) | ORAL | 0 refills | Status: AC
Start: 2017-07-17 — End: ?

## 2017-07-17 MED ORDER — PROPRANOLOL HCL 40 MG PO TABS: 40.0000 mg | ORAL_TABLET | Freq: Two times a day (BID) | ORAL | 2 refills | Status: DC

## 2017-07-17 NOTE — Progress Notes (Signed)
Pt discharging at this time via [facility] transport to [facility]. Daughter took all personal belongings. Dressings changed. IV removed, dressing dry and intact. Peg tube in place. Foley patent. No noted distress. Report called in to nurse, Claiborne Billings.

## 2017-07-17 NOTE — Discharge Summary (Signed)
Stroke Discharge Summary  Patient ID: Robert Keith   MRN: [id number]      DOB: 07/14/1933  Date of Admission: 06/26/2017 Date of Discharge: 07/17/2017  Attending Physician:  Rosalin Hawking, MD, Stroke MD Consultant(s):    cardiology, pulmonary/intensive care, nephrology, rehabilitation medicine, vascular surgery and trauma, and wound care Patient's PCP:  Leonel Ramsay, MD  DISCHARGE DIAGNOSIS: Active Problems:   Acute CVA (cerebrovascular accident) ([place]) -  Left MCA infarct due to emergent large vessel occlusion of the left M1 segment, some reconstitution of anterior L MCA branch vessels from pial collateralls, s/p tPA and mechanical thrombectomy with TICI3 revascularization   Hematoma of thigh, right, initial encounter   Postoperative groin pseudoaneurysm ([place])   Hemorrhagic shock ([place])   Cytotoxic brain edema ([place])   Acute respiratory failure (HCC)   Chronic subdural hematoma (HCC)   Aphasia   Leukocytosis, resolved   Hypernatremia, resolved   AKI (acute kidney injury) ([place])   Past Medical History:  Diagnosis Date  . Anemia   . Cancer [facility])    prostate  . Chronic atrial fibrillation ([place])   . Coronary artery disease    a. s/p CABG [facility] 1996.  . Diabetes mellitus without complication ([place])   . Essential tremor   . GI bleed 09/2016  . H. pylori infection   . H/O craniotomy   . Hypertension   . Intracranial hemorrhage ([place])   . Prostate cancer ([place])   . RBBB   . SDH (subdural hematoma) (HCC)    Past Surgical History:  Procedure Laterality Date  . brain surgery for bleed    . cardiac bypass    . CORONARY ARTERY BYPASS GRAFT    . ESOPHAGOGASTRODUODENOSCOPY N/A 07/12/2017   Procedure: ESOPHAGOGASTRODUODENOSCOPY (EGD);  Surgeon: [name], MD;  Location: [place];  Service: General;  Laterality: N/A;  . ESOPHAGOGASTRODUODENOSCOPY (EGD) WITH PROPOFOL N/A 09/18/2016   Procedure: ESOPHAGOGASTRODUODENOSCOPY (EGD) WITH PROPOFOL;  Surgeon: [name], MD;   Location: [place] [facility];  Service: Endoscopy;  Laterality: N/A;  . FEMORAL ARTERY EXPLORATION Right 06/27/2017   Procedure: FEMORAL ARTERY EXPLORATION;  Surgeon: Conrad [place], MD;  Location: [place];  Service: Vascular;  Laterality: Right;  . IR PERCUTANEOUS ART THROMBECTOMY/INFUSION INTRACRANIAL INC DIAG ANGIO  06/26/2017  . PEG PLACEMENT N/A 07/12/2017   Procedure: PERCUTANEOUS ENDOSCOPIC GASTROSTOMY (PEG) PLACEMENT;  Surgeon: [name], MD;  Location: [place];  Service: General;  Laterality: N/A;  . RADIOLOGY WITH ANESTHESIA N/A 06/26/2017   Procedure: RADIOLOGY WITH ANESTHESIA CODE STROKE;  Surgeon: [name], MD;  Location: [place];  Service: Radiology;  Laterality: N/A;    Allergies as of 07/17/2017      Reactions   Metformin And Related Diarrhea   Tape Other (See Comments)   PLEASE USE COBAN WRAP; PATIENT'S SKIN IS THIN AND WILL TEAR EASILY!!   Bupropion Other (See Comments)   Unknown   Fenofibrate Micronized Other (See Comments)   Unknown   Gemfibrozil Other (See Comments)   Unknown   Niacin Other (See Comments)   Impotence   Phenobarbital Other (See Comments)   Confusion   Simvastatin Other (See Comments)   Myalgia      Medication List    STOP taking these medications   furosemide 20 MG tablet Commonly known as:  LASIX   KLOR-CON M20 20 MEQ tablet Generic drug:  potassium chloride SA   pantoprazole 40 MG tablet Commonly known as:  PROTONIX     TAKE  these medications   amLODipine 5 MG tablet Commonly known as:  NORVASC Place 1 tablet (5 mg total) into feeding tube daily. What changed:  how much to take  how to take this  when to take this   amoxicillin-clavulanate 875-125 MG tablet Commonly known as:  AUGMENTIN Place 1 tablet into feeding tube 2 (two) times daily.   aspirin 325 MG tablet Place 1 tablet (325 mg total) into feeding tube daily.   clotrimazole 1 % cream Commonly known as:  LOTRIMIN Apply 1 application topically 2 (two) times  daily.   famotidine 40 MG tablet Commonly known as:  PEPCID Place 1 tablet (40 mg total) into feeding tube 2 (two) times daily before a meal.   feeding supplement (GLUCERNA 1.5 CAL) Liqd Place 1,000 mLs into feeding tube continuous. 60cc/h   feeding supplement (PRO-STAT SUGAR FREE 64) Liqd Place 30 mLs into feeding tube 2 (two) times daily.   free water Soln Place 300 mLs into feeding tube every 6 (six) hours.   glipiZIDE 10 MG tablet Commonly known as:  GLUCOTROL Place 1 tablet (10 mg total) into feeding tube 2 (two) times daily with a meal. What changed:  how to take this  when to take this   loratadine 10 MG tablet Commonly known as:  CLARITIN Place 1 tablet (10 mg total) into feeding tube daily. What changed:  how to take this   MEGARED OMEGA-3 KRILL OIL PO Take 1 capsule by mouth daily.   metoprolol tartrate 100 MG tablet Commonly known as:  LOPRESSOR Place 1 tablet (100 mg total) into feeding tube 2 (two) times daily. What changed:  how to take this   nystatin 100000 UNIT/ML suspension Commonly known as:  MYCOSTATIN Use as directed 5 mLs (500,000 Units total) in the mouth or throat 4 (four) times daily.   pioglitazone 15 MG tablet Commonly known as:  ACTOS Place 1 tablet (15 mg total) into feeding tube daily with breakfast. What changed:  how to take this  when to take this   pravastatin 40 MG tablet Commonly known as:  PRAVACHOL Place 1 tablet (40 mg total) into feeding tube at bedtime. What changed:  how to take this   propranolol 40 MG tablet Commonly known as:  INDERAL Place 1 tablet (40 mg total) into feeding tube 2 (two) times daily. What changed:  how to take this   senna-docusate 8.6-50 MG tablet Commonly known as:  Senokot-S Place 1 tablet into feeding tube at bedtime as needed for mild constipation.       LABORATORY STUDIES CBC    Component Value Date/Time   WBC 7.6 07/17/2017 0423   RBC 3.06 (L) 07/17/2017 0423   HGB 9.0 (L)  07/17/2017 0423   HGB 15.2 12/22/2014 1430   HCT 29.8 (L) 07/17/2017 0423   HCT 46.1 12/22/2014 1430   PLT 265 07/17/2017 0423   PLT 194 12/22/2014 1430   MCV 97.4 07/17/2017 0423   MCV 96 12/22/2014 1430   MCH 29.4 07/17/2017 0423   MCHC 30.2 07/17/2017 0423   RDW 18.1 (H) 07/17/2017 0423   RDW 14.0 12/22/2014 1430   LYMPHSABS 0.7 06/27/2017 0314   LYMPHSABS 1.6 12/22/2014 1430   MONOABS 0.9 06/27/2017 0314   MONOABS 1.1 (H) 12/22/2014 1430   EOSABS 0.0 06/27/2017 0314   EOSABS 0.1 12/22/2014 1430   BASOSABS 0.0 06/27/2017 0314   BASOSABS 0.1 12/22/2014 1430   CMP    Component Value Date/Time   NA 137 07/17/2017  0423   NA 142 12/22/2014 1430   K 3.8 07/17/2017 0423   K 4.3 12/22/2014 1430   CL 107 07/17/2017 0423   CL 108 (H) 12/22/2014 1430   CO2 23 07/17/2017 0423   CO2 27 12/22/2014 1430   GLUCOSE 151 (H) 07/17/2017 0423   GLUCOSE 119 (H) 12/22/2014 1430   BUN 27 (H) 07/17/2017 0423   BUN 20 (H) 12/22/2014 1430   CREATININE 0.79 07/17/2017 0423   CREATININE 1.13 12/22/2014 1430   CALCIUM 7.6 (L) 07/17/2017 0423   CALCIUM 8.8 12/22/2014 1430   PROT 6.7 06/26/2017 1420   ALBUMIN 3.6 06/26/2017 1420   AST 34 06/26/2017 1420   ALT 33 06/26/2017 1420   ALKPHOS 88 06/26/2017 1420   BILITOT 0.9 06/26/2017 1420   GFRNONAA >60 07/17/2017 0423   GFRNONAA >60 12/22/2014 1430   GFRAA >60 07/17/2017 0423   GFRAA >60 12/22/2014 1430   COAGS Lab Results  Component Value Date   INR 1.59 06/27/2017   INR 1.67 06/27/2017   INR 1.13 06/26/2017   Lipid Panel    Component Value Date/Time   CHOL 115 06/27/2017 0500   TRIG 86 06/27/2017 0500   HDL 20 (L) 06/27/2017 0500   CHOLHDL 5.8 06/27/2017 0500   VLDL 17 06/27/2017 0500   LDLCALC 78 06/27/2017 0500   HgbA1C  Lab Results  Component Value Date   HGBA1C 6.8 (H) 06/27/2017   Urinalysis    Component Value Date/Time   COLORURINE YELLOW 07/15/2017 1752   APPEARANCEUR CLEAR 07/15/2017 1752   APPEARANCEUR Clear  12/22/2014 1430   LABSPEC 1.016 07/15/2017 1752   LABSPEC 1.017 12/22/2014 1430   PHURINE 5.0 07/15/2017 1752   GLUCOSEU 150 (A) 07/15/2017 1752   GLUCOSEU Negative 12/22/2014 1430   HGBUR MODERATE (A) 07/15/2017 1752   BILIRUBINUR NEGATIVE 07/15/2017 1752   BILIRUBINUR Negative 12/22/2014 1430   KETONESUR NEGATIVE 07/15/2017 1752   PROTEINUR 30 (A) 07/15/2017 1752   NITRITE NEGATIVE 07/15/2017 1752   LEUKOCYTESUR NEGATIVE 07/15/2017 1752   LEUKOCYTESUR Negative 12/22/2014 1430   Urine Drug Screen No results found for: LABOPIA, COCAINSCRNUR, LABBENZ, AMPHETMU, THCU, LABBARB  Alcohol Level No results found for: [facility]   SIGNIFICANT DIAGNOSTIC STUDIES Ct Abdomen Pelvis Wo Contrast 06/27/2017 1. Hematoma within the proximal RIGHT anterior thigh is incompletely imaged. Hematoma likely spontaneous related to anticoagulation. Recommend clinical correlation with extent of hematoma in the RIGHT thigh.  2. No intraperitoneal or retroperitoneal hemorrhage.  3. Extensive diverticulosis without diverticulitis.  4.  Aortic Atherosclerosis (ICD10-I70.0).  5. Small bilateral pleural effusions.   Ct Angio Head and neck W and Wo Contrast 06/26/2017 1. Emergent large vessel occlusion of the left M1 segment.  2. There is some reconstitution of anterior left MCA branch vessels from pial collaterals.  3. Dense calcifications at the carotid bifurcations bilaterally without significant stenoses relative to the more distal vessels.  4. Moderate narrowing of the proximal posterior cerebral arteries bilaterally, right greater than left.  5. Calcifications at great vessel origins along the arch without significant stenosis. 6. Hypoplastic left vertebral artery.  7. Multilevel cervical spinal spondylosis.  8. Emphysema.  9. Prior granulomatous disease in the right upper lobe.  Cerebral angiogram and thrombectomy - Dr. Estanislado Pandy 06/26/2017 Status post endovascular complete revascularization of occluded  left middle cerebral artery with 1 pass with Solitaire 4 mm x 40 mm FR retrieval device and a 6 [place] intermediary guide catheter achieving a TICI 3 reperfusion.  Mr Brain Wo Contrast 06/27/2017 Completed  acute infarction affecting the majority of the inferior division left MCA distribution. This affects the left temporal lobe and posterior frontal lobe with patchy involvement in the parietal region.  Infarction of the caudate and putamen as well.  Petechial blood product deposition without focal hematoma.  Swelling but no shift at this time.  Ct Cerebral Perfusion W Contrast 06/26/2017 Left M1 emergent large vessel occlusion with associated acute left MCA territory infarct.  Core infarct is estimated at 49 mL.  The tissue at risk is estimated at 182 mL.   Ct Head Code Stroke W/o Cm 06/26/2017 1. Focal hyperdensity in left distal M1 may represent thrombus.  2. No vascular territory infarct identified at this time.  3. Small left frontal subdural hematoma is stable in size and attenuation from 2014.  4. ASPECTS is 10   06/29/2017 1. Evolving large LEFT MCA territory infarct (LEFT basal ganglia, LEFT temporoparietal lobes) with petechial hemorrhage, no hemorrhagic conversion.  2. Acute small volume LEFT cerebrum extra-axial blood products.  3. Chronic changes include old small LEFT frontal subdural hematoma and small LEFT frontal encephalomalacia.    Transthoracic echocardiogram 06/28/2017 Study Conclusions - Left ventricle: The cavity size was normal. There was mild concentric hypertrophy. Systolic function was normal. The  estimated ejection fraction was in the range of 50% to 55%. Hypokinesis of the apical inferior and akinesis of the basal to mid inferior myocardium. Doppler parameters are consistent with indeterminate ventricular filling pressure. - Aortic valve: Valve mobility was restricted. There was mild stenosis. There was mild regurgitation. Peak velocity  (S): 300 cm/s. Mean gradient (S): 19 mm Hg. Valve area (VTI): 1.64 cm^2. Valve area (Vmax): 1.49 cm^2. Valve area (Vmean): 1.39 cm^2. Regurgitation pressure half-time: 723 ms. - Mitral valve: Transvalvular velocity was within the normal range. There was no evidence for stenosis. There was no regurgitation. - Left atrium: The atrium was severely dilated. - Right ventricle: The cavity size was normal. Wall thickness was normal. Systolic function was normal. - Tricuspid valve: There was mild regurgitation. - Pulmonary arteries: Systolic pressure was within the normal range. PA peak pressure: 27 mm Hg (S)  Ct Head Wo Contrast 07/04/2017 IMPRESSION:  1. Unchanged extent of cytotoxic edema within the posterior left MCA territory and left basal ganglia with mass effect on the left lateral ventricle but no midline shift or hydrocephalus.  2. Decreased number of foci petechial hemorrhage. No hemorrhagic conversion/parenchymal hematoma.   LE venous doppler - There is no obviousevidence of deep or superficial vein thrombosis involving the right and left lower extremities. All clearlyvisualized vessels appear patent and compressible. There is no evidence of Baker's cysts bilaterally.  Dg Chest Port 1 View 07/05/2017 IMPRESSION: No change in left basilar changes.   US renal  Mild bilateral hydronephrosis.  CXR  07/13/2017 Continued left lower lobe atelectasis or infiltrate with small left Effusion. Cardiomegaly, vascular congestion.  07/15/2017 IMPRESSION: Stable left basilar opacity as described above.      HISTORY OF PRESENT ILLNESS [name] is a 83 y.o. male with history of prostate cancer, CAD, DM2, essential tremor, hypertension, and subdural hematoma, s/p craniectomy and clot evacuation who presented with right facial droop, dense right hemiplegia global aphasia, and left gaze deviation. He recevied tPA at 1507 on 06/26/2017.  Mr. Tomei unable to provide  history due to his stroke. History is obtained from EMS as well as patient's health power of attorney and daughter whom I spoke to over the phone. Patient was at home in  his usual state of health until 1:30 PM when he had witnessed onset of fall with right facial droop, dense right hemiplegia global aphasia, and left gaze deviation. CT head with hyperdense left MCA and ASPECTS score of 10. Patient had a chronic small left frontal subdural hematoma which appear to be unchanged from prior scan from 2014. Patient had prior history of subdural hematoma requiring craniotomy in 2014. At baseline he was fairly functional, walking with a cane and managing his own affairs. Patient has history of atrial fibrillation but because of prior GI bleed has not been on anticoagulation as per the family. IV tPA was initially started but the infusion was held till I spoke to the patient's daughter over the phone as well as health power of attorney and explained the increased risk involved with IV TPA and since intervention would likely be delayed for about an hour since he had another patient on the table getting mechanical thrombectomy. Other alternatives including transfer to [facility], discussed with the family but it was clear that that would also involve a delay of an hour.  After careful discussion about increased risk of bleeding intracranially with IV tPA given prior history of subdural hematoma the daughter and health power of attorney agreed to give IV tPA. I also discussed the case with Dr. Demetra Shiner, vascular neurologist, for a second opinion and he agreed with my assessment and treatment plan.TPA infusion was then restarted after the above discussion.  Interventional team was informed about the case after the CT angiogram confirmed left M1 occlusion and CT perfusion showed favorable characteristics for revascularization.  The patient was revascularized in IR without complication.     HOSPITAL  COURSE Mr. JOHNOTHAN BASCOMB is a 83 y.o. male with history of prostate cancer, CAD, history of GI bleed, DM2, essential tremor, hypertension, history of atrial fibrillation and subdural hematoma, s/p craniectomy and clot evacuation who presented with right facial droop, dense right hemiplegia global aphasia, and left gaze deviation. He recevied tPA at 1507 on 06/26/2017.  CTA Occluded left M1 -> IR thrombectomy TICI 3 reperfusion with Dr. Estanislado Pandy.  Stroke: left MCA infarct due to left M1 occlusion s/p thrombectomy due to afib not on AC  Resultant  aphasia and right hemiplegia  CT head:  Focal hyperdensity in left distal M1 may represent thrombus. Small left frontal subdural hematoma is stable in size and attenuation from 2014.   MRI head: left MCA infarct. Petechial blood product deposition without focal hematoma.  CT Angio Head: Emergent large vessel occlusion of the left M1 segment. Dense calcifications at the carotid bifurcations bilaterally without significant stenoses  2D Echo - EF 50-55%. Left atrium severely dilated. No cardiac source of emboli identified.  LDL 78  HgbA1c 6.8  SCDs for VTE prophylaxis  Diet NPO time specified  No antithrombotic prior to admission, now on ASA 325mg . Pt deemed not candidate for anticoagulation per cardiology.  Patient counseled to be compliant with his antithrombotic medications  Ongoing aggressive stroke risk factor management  Therapy recommendations:  SNF ([place])  Disposition: pending  Right femoral pseudoaneurysm with massive thigh hematoma s/p IR due to PFA tear   VVS on board, appreciate recs  Requiring surgical suturing and clot evacuation  JP drain removed   Dressing dry and clean  Afib not on [facility]  Not on AC at baseline due to previous SDH s/p crani and evacuation  Pt deemed not anticoagulation candidate per cardiology  On metoprolol   Rate  controlled  Cardiology on board and recs  appreciated  Leukocytosis with low grade fever, resolved  WBC 11.1 -> 16.8 (temp 100.7) -> 13.1(afebrile)->7.6  CXR stable  UA WBC TNTC  Urine culture pending  Right groin wound no sign of infection  Blood culture NGTD  Empiric zosyn treatment: switch at discharge to Augmentin 875-125 BID per tube for 4 days   AKI, resolved  Cre 0.90->1.99->2.93->4.5->3.2->1.3->0.7->0.74->0.77->0.79  Due to urinary retention  Nephrology help appreciated  Foley catheter inserted 07/07/17  Hypernatremia  Na 146->150->148->142->141->139->138->136->137  On TF @ 60 and free water @ 50  Hyperlipidemia  Home meds: pravastatin 40mg  PO daily  LDL 78, goal < 70  Statin resumed  Continue statin at discharge  Diabetes  HgbA1c 6.8, goal < 7.0  Controlled  Hyperglycemia under control now  Resumed home medications  SSI  Anemia, improved  Due to acute blood loss  Hb 9.0->8.2->7.8->7.8->8.7->8.7->9.3->9.0->9.6-> 9.3->9.4->8.7->9.0  On ASA   Other Stroke Risk Factors  Advanced age  Family hx stroke (Mother)  CAD  Other Active Problems  Thrombocytopenia, resolved  hypokalemia, resolved  GI bleed history  DISCHARGE EXAM Blood pressure (!) 99/58, pulse 77, temperature 97.6 F (36.4 C), temperature source Oral, resp. rate 18, height 5\' 9"  (1.753 m), weight 192 lb 10.9 oz (87.4 kg), SpO2 98 %. General - frail elderly Caucasian male Ophthalmologic - Fundi not visualized due to noncooperation.  Cardiovascular - irregularly irregular heart rate and rhythm with RVR.  Extremities - right groin wound dress dry and clean.   Neuro -   awake alert, on nasal canula , eyes open, can visually track. Blinks to threat on the left but not on the right.  Global aphasia except able to close eyes on command, intelligible words. PERRL. Right facial droop, tongue midline in mouth. Spontaneous movement of LUE and LLE, at least 3/5 LUE and 2+/5 LLE. RUE 0/5 and RLE 2/5. DTR 1+  and right positive babinski. Sensation, coordination and gait not tested.  Discharge Diet   DIET - DYS 1 Room service appropriate? Yes; Fluid consistency: Nectar Thick liquids  DISCHARGE PLAN  Disposition: Discharge to SNF [facility])  aspirin 325 mg daily for secondary stroke prevention.  Ongoing risk factor control by Primary Care Physician at time of discharge  Follow-up Leonel Ramsay, MD in 2 weeks.  Follow-up with Dr. Antony Contras, Stroke Clinic in 6 weeks, office to schedule an appointment.  50 minutes were spent preparing discharge.  Rosalin Hawking, MD PhD Stroke Neurology 07/17/2017 3:35 PM

## 2017-07-17 NOTE — Care Management Note (Signed)
Case Management Note  Patient Details  Name: Robert Keith MRN: [id number] Date of Birth: November 30, 1933  Subjective/Objective:                    Action/Plan: Patient discharging to [facility] today. No further needs per CM.  Expected Discharge Date:  07/17/17               Expected Discharge Plan:  IP Rehab Facility  In-House Referral:  Clinical Social Work  Discharge planning Services  CM Consult  Post Acute Care Choice:    Choice offered to:     DME Arranged:    DME Agency:     HH Arranged:    [place] Agency:     Status of Service:  Completed, signed off  If discussed at [employer], dates discussed:    Additional Comments:  Pollie Friar, RN 07/17/2017, 2:05 PM

## 2017-07-17 NOTE — Progress Notes (Signed)
Discharge to: [place] Anticipated discharge date: 07/17/17 Family notified: Yes, at bedside Transportation by: [facility]  Report #: [phone], Room 104B  [place] signing off.  Laveda Abbe LCSW [phone]

## 2017-07-17 NOTE — Progress Notes (Signed)
Modified Barium Swallow Progress Note  Patient Details  Name: Robert Keith MRN: [id number] Date of Birth: 1933-07-16  Today's Date: 07/17/2017  Modified Barium Swallow completed.  Full report located under Chart Review in the Imaging Section.  Brief recommendations include the following:  Clinical Impression  Pt has a moderate oropharyngeal dysphagia with improved strength from previous MBS, resulting in overall reduced pharyngeal residuals. He still has a weak, prolonged oral phase that leads to oral residue. Thin liquids and larger boluses of nectar thick liquids are penetrated with subtle throat clearing before the swallow due to impaired timing. He has improved coordination with honey thick liquids but unfortunately an increased amount of oral residuals that are subsequently aspirated after the swallow. A cough response is elicited but is not effective, and he does not follow commands to cough volitionally. Pt is also impulsive with his intake, and when it is limited to spoonfuls he is able to better protect his airway with even nectar thick liquids. Recommend Dys 1 diet and nectar thick liquids by spoon only. Pt will need full supervision and assistance with careful feeding to maximize safety.    Swallow Evaluation Recommendations       SLP Diet Recommendations: Dysphagia 1 (Puree) solids;Nectar thick liquid   Liquid Administration via: Spoon   Medication Administration: Crushed with puree (or by alternative means)   Supervision: Staff to assist with self feeding;Full supervision/cueing for compensatory strategies   Compensations: Slow rate;Small sips/bites;Minimize environmental distractions;Lingual sweep for clearance of pocketing   Postural Changes: Remain semi-upright after after feeds/meals (Comment);Seated upright at 90 degrees   Oral Care Recommendations: Oral care BID   Other Recommendations: Have oral suction available    Germain Osgood 07/17/2017,11:40 AM   Germain Osgood, M.A. CCC-SLP [phone]

## 2017-07-17 NOTE — Progress Notes (Signed)
CSW spoke with patient's daughter over the phone to provide update on bed offers as well as discuss research that the daughter conducted yesterday on facilities. CSW updated patient's daughter that [facility] has offered a bed, and patient's daughter indicated that she would go and tour the facility this morning. Patient's daughter discussed that of the two facilities she had looked at yesterday, she liked [facility] the most.   Patient's daughter expressed that the nurse had been in this morning and saw that the patient had been moving better and would check in with physical therapy on possibly seeing if the patient could be a CIR candidate again after improving. CSW indicated she could check with PT on seeing the patient again after he returns from his procedure, but she should continue in the pursuit of SNF placement to still have a discharge venue in place.  Laveda Abbe, [place] Clinical Social Worker [phone]

## 2017-07-17 NOTE — Progress Notes (Signed)
Physical Therapy Treatment Patient Details Name: Robert Keith MRN: [id number] DOB: [birth year] Today's Date: 07/17/2017    History of Present Illness Pt is an 83 y.o. male who presented to the ED with global aphasia, Rt hemiplegia, and L gaze deviation. Pt with L M1 occlusion s/p tPA and mechanical thrombectomy with revascularization.  He was intubated 7/11-7/16. Pt had significant Rt thigh hematoma with Rt femoral pseudoaneurysm following sheath removal and underwent Rt femoral artery repair on 7/12. Pt transferred back to ICU on 07/04/17. PMHx: prostate cancer, CAD, history of GI bleed, DM2, essential tremor, hypertension, history of atrial fibrillation and subdural hematoma s/p craniectomy and clot evacuation.    PT Comments    Pt seen for mobility progression. He continues to require heavy physical assistance of two for bed mobility. He was able to progress from requiring max A to close min guard with bilateral UE supports to sit EOB x15 minutes. Pt would continue to benefit from skilled physical therapy services at this time while admitted and after d/c to address the below listed limitations in order to improve overall safety and independence with functional mobility.    Follow Up Recommendations  SNF     Equipment Recommendations  Wheelchair (measurements PT);Wheelchair cushion (measurements PT)    Recommendations for Other Services       Precautions / Restrictions Precautions Precautions: Fall Precaution Comments: rt groin incision Restrictions Weight Bearing Restrictions: No    Mobility  Bed Mobility Overal bed mobility: Needs Assistance Bed Mobility: Supine to Sit;Sit to Supine     Supine to sit: +2 for physical assistance;Total assist Sit to supine: Total assist;+2 for physical assistance   General bed mobility comments: total A x2 for all aspects  Transfers                    Ambulation/Gait                 Stairs            Wheelchair  Mobility    Modified Rankin (Stroke Patients Only) Modified Rankin (Stroke Patients Only) Pre-Morbid Rankin Score: No symptoms Modified Rankin: Severe disability     Balance Overall balance assessment: Needs assistance Sitting-balance support: Feet supported;Bilateral upper extremity supported Sitting balance-Leahy Scale: Poor Sitting balance - Comments: pt progressing from requiring max A to close min guard with bilateral UE supports. Pt tolerated sitting EOB x15 minutes Postural control: Posterior lean                                  Cognition Arousal/Alertness: Awake/alert Behavior During Therapy: Flat affect (smiling throughout) Overall Cognitive Status: Difficult to assess Area of Impairment: Attention;Following commands;Safety/judgement;Problem solving                   Current Attention Level: Sustained   Following Commands: Follows one step commands inconsistently;Follows one step commands with increased time Safety/Judgement: Decreased awareness of safety;Decreased awareness of deficits   Problem Solving: Slow processing;Decreased initiation;Difficulty sequencing;Requires verbal cues;Requires tactile cues        Exercises General Exercises - Lower Extremity Ankle Circles/Pumps: AROM;10 reps;Supine;Both Heel Slides: AAROM;10 reps;Both;Supine    General Comments        Pertinent Vitals/Pain Pain Assessment: Faces Faces Pain Scale: No hurt    Home Living  Prior Function            PT Goals (current goals can now be found in the care plan section) Acute Rehab PT Goals PT Goal Formulation: Patient unable to participate in goal setting Time For Goal Achievement: 07/22/17 Potential to Achieve Goals: Fair Progress towards PT goals: Progressing toward goals    Frequency    Min 3X/week      PT Plan Discharge plan needs to be updated    Co-evaluation              AM-PAC PT "6 Clicks" Daily  Activity  Outcome Measure  Difficulty turning over in bed (including adjusting bedclothes, sheets and blankets)?: Total Difficulty moving from lying on back to sitting on the side of the bed? : Total Difficulty sitting down on and standing up from a chair with arms (e.g., wheelchair, bedside commode, etc,.)?: Total Help needed moving to and from a bed to chair (including a wheelchair)?: Total Help needed walking in hospital room?: Total Help needed climbing 3-5 steps with a railing? : Total 6 Click Score: 6    End of Session   Activity Tolerance: Patient limited by fatigue Patient left: in bed;with call bell/phone within reach;with family/visitor present Nurse Communication: Mobility status PT Visit Diagnosis: Muscle weakness (generalized) (M62.81);Hemiplegia and hemiparesis;Other abnormalities of gait and mobility (R26.89) Hemiplegia - Right/Left: Right Hemiplegia - dominant/non-dominant: Dominant Hemiplegia - caused by: Cerebral infarction     Time: 3007-6226 PT Time Calculation (min) (ACUTE ONLY): 27 min  Charges:  $Therapeutic Activity: 23-37 mins                    G Codes:       [place], PT, [place] 07/17/2017, 2:41 PM

## 2017-07-18 LAB — URINE CULTURE: CULTURE: NO GROWTH

## 2017-07-20 LAB — CULTURE, BLOOD (ROUTINE X 2)
Culture: NO GROWTH
Culture: NO GROWTH
SPECIAL REQUESTS: ADEQUATE
Special Requests: ADEQUATE

## 2017-08-05 ENCOUNTER — Telehealth (HOSPITAL_COMMUNITY): Payer: Self-pay

## 2017-08-05 NOTE — Telephone Encounter (Signed)
Called to schedule f/u, left message for pt to return call. AW 

## 2017-08-06 ENCOUNTER — Other Ambulatory Visit (HOSPITAL_COMMUNITY): Payer: Self-pay | Admitting: Interventional Radiology

## 2017-08-06 ENCOUNTER — Telehealth: Payer: Self-pay | Admitting: Cardiovascular Disease

## 2017-08-06 DIAGNOSIS — I639 Cerebral infarction, unspecified: Secondary | ICD-10-CM

## 2017-08-06 NOTE — Telephone Encounter (Signed)
Daughter returning call re: scheduling over due fu  Patient admitted to [facility] in July for CVA and is now in SNF receiving therapy  Patient scheduled to see Gollan 10/18 at 2 pm but this may need to r.s in the future depending on patient progress with rehab.

## 2017-08-06 NOTE — Telephone Encounter (Signed)
Noted  

## 2017-08-28 ENCOUNTER — Ambulatory Visit (HOSPITAL_COMMUNITY): Payer: Medicare Other

## 2017-09-11 ENCOUNTER — Ambulatory Visit (HOSPITAL_COMMUNITY): Admission: RE | Admit: 2017-09-11 | Payer: Medicare Other | Source: Ambulatory Visit

## 2017-09-17 ENCOUNTER — Ambulatory Visit (INDEPENDENT_AMBULATORY_CARE_PROVIDER_SITE_OTHER): Payer: Medicare Other | Admitting: Neurology

## 2017-09-17 ENCOUNTER — Encounter: Payer: Self-pay | Admitting: Neurology

## 2017-09-17 VITALS — BP 98/65 | HR 63 | Wt 180.0 lb

## 2017-09-17 DIAGNOSIS — I729 Aneurysm of unspecified site: Secondary | ICD-10-CM | POA: Diagnosis not present

## 2017-09-17 DIAGNOSIS — IMO0002 Reserved for concepts with insufficient information to code with codable children: Secondary | ICD-10-CM

## 2017-09-17 DIAGNOSIS — I482 Chronic atrial fibrillation, unspecified: Secondary | ICD-10-CM

## 2017-09-17 DIAGNOSIS — I63412 Cerebral infarction due to embolism of left middle cerebral artery: Secondary | ICD-10-CM | POA: Diagnosis not present

## 2017-09-17 DIAGNOSIS — Z8679 Personal history of other diseases of the circulatory system: Secondary | ICD-10-CM | POA: Diagnosis not present

## 2017-09-17 DIAGNOSIS — G8191 Hemiplegia, unspecified affecting right dominant side: Secondary | ICD-10-CM

## 2017-09-17 NOTE — Progress Notes (Signed)
STROKE NEUROLOGY FOLLOW UP NOTE  NAME: Robert Keith DOB: [birth year]  REASON FOR VISIT: stroke follow up HISTORY FROM: daughter and chart  Today we had the pleasure of seeing Robert Keith in follow-up at our Neurology Clinic. Pt was accompanied by daughters.   History Summary Robert Keith a 83 y.o.malewith history of prostate cancer, CAD, history of GI bleed, DM2, essential tremor, hypertension, history of atrial fibrillation and subdural hematoma, s/p craniectomy and clot evacuationadmitted on 06/26/17 for right facial droop, dense right hemiplegia global aphasia, and left gaze deviation. He recevied tPA. CTA showed occluded left distal M1 s/pIR thrombectomy with TICI 3 reperfusion. CT head also showed small left frontal SDh but stable in size since 2014. MRI showed large left MCA infarct. EF 50-55% with left atrium severely dilated. LDL 78 and A1C 6.8. However, pt developed right femoral pseudoaneurysm with massive sized hematoma post IR due to PFA tear. VVS consulted and had surgical repair with clot evacuation followed by JP drain. Eventually healed on discharge. Patient deemed not an anticoagulation candidate per cardiology due to previous history of SDH s/p crani and evacuation. He was discharged to SNF on ASA 325mg .   Interval History During the interval time, the patient has been doing  better.  Still in SNF, clinically stable. However, still has expressive aphasia, left hemiplegia and left facial droop, came in today in a wheelchair. PT/OT has been stopped due to no significant improvement and lack of cooperation. Speech still following with him and passed swallow on soft diet but still has medication from PEG tube. Has not seen VVS for follow up yet. BP today was low in clinic 98/65. Currently on amlodipine 5 mg daily and metoprolol 100 mg twice a day.   REVIEW OF SYSTEMS: Full 14 system review of systems performed and notable only for those listed below and in HPI above, all  others are negative:  Constitutional:   Cardiovascular:  Ear/Nose/Throat:   Skin:  Eyes:  Eye discharge Respiratory:   Gastroitestinal:   Genitourinary:  Hematology/Lymphatic:   Endocrine:  Musculoskeletal:   Allergy/Immunology:   Neurological:  Tremor Psychiatric:  Depression Sleep:   The following represents the patient's updated allergies and side effects list: Allergies  Allergen Reactions  . Metformin And Related Diarrhea  . Tape Other (See Comments)    PLEASE USE COBAN WRAP; PATIENT'S SKIN IS THIN AND WILL TEAR EASILY!!  . Bupropion Other (See Comments)    Unknown  . Fenofibrate Micronized Other (See Comments)    Unknown  . Gemfibrozil Other (See Comments)    Unknown  . Niacin Other (See Comments)    Impotence   . Phenobarbital Other (See Comments)    Confusion   . Simvastatin Other (See Comments)    Myalgia    The neurologically relevant items on the patient's problem list were reviewed on today's visit.  Neurologic Examination  A problem focused neurological exam (12 or more points of the single system neurologic examination, vital signs counts as 1 point, cranial nerves count for 8 points) was performed.  Blood pressure 98/65, pulse 63, weight 180 lb (81.6 kg).  General - Well nourished, well developed, in no apparent distress.  Ophthalmologic - Fundi not visualized due to noncooperation.  Cardiovascular - irregularly irregular heart rate and rhythm.  Mental Status -  Awake, alert, smiling to provider, expressive aphasia, word salad, able to follow central and peripheral simple commands, not able to name or repeat. PERRL, EOMI, significant right facial droop,  tongue midline. LUE and LLE spontaneous movement, purposeful, at least 4/5, LUE 0/5 and LLE 2/5 proximal and 3/5 knee extension, 0/5 DF/PF. DTR 1+ and no babinski. Sensation, coordination not cooperative and gait not tested, in the wheelchair.  Functional score  mRS = 4   0 - No symptoms.    1 - No significant disability. Able to carry out all usual activities, despite some symptoms.   2 - Slight disability. Able to look after own affairs without assistance, but unable to carry out all previous activities.   3 - Moderate disability. Requires some help, but able to walk unassisted.   4 - Moderately severe disability. Unable to attend to own bodily needs without assistance, and unable to walk unassisted.   5 - Severe disability. Requires constant nursing care and attention, bedridden, incontinent.   6 - Dead.   NIH Stroke Scale   Level Of Consciousness 0=Alert; keenly responsive 1=Not alert, but arousable by minor stimulation 2=Not alert, requires repeated stimulation 3=Responds only with reflex movements 0  LOC Questions to Month and Age 62=Answers both questions correctly 1=Answers one question correctly 2=Answers neither question correctly 2  LOC Commands      -Open/Close eyes     -Open/close grip 0=Performs both tasks correctly 1=Performs one task correctly 2=Performs neighter task correctly 0  Best Gaze 0=Normal 1=Partial gaze palsy 2=Forced deviation, or total gaze paresis 0  Visual 0=No visual loss 1=Partial hemianopia 2=Complete hemianopia 3=Bilateral hemianopia (blind including cortical blindness) 0  Facial Palsy 0=Normal symmetrical movement 1=Minor paralysis (asymmetry) 2=Partial paralysis (lower face) 3=Complete paralysis (upper and lower face) 2  Motor  0=No drift, limb holds posture for full 10 seconds 1=Drift, limb holds posture, no drift to bed 2=Some antigravity effort, cannot maintain posture, drifts to bed 3=No effort against gravity, limb falls 4=No movement Right Arm 0     Leg 2    Left Arm 0     Leg 0  Limb Ataxia 0=Absent 1=Present in one limb 2=Present in two limbs 0  Sensory 0=Normal 1=Mild to moderate sensory loss 2=Severe to total sensory loss 0  Best Language 0=No aphasia, normal 1=Mild to moderate aphasia 2=Mute, global  aphasia 3=Mute, global aphasia 2  Dysarthria 0=Normal 1=Mild to moderate 2=Severe, unintelligible or mute/anarthric 2  Extinction/Neglect 0=No abnormality 1=Extinction to bilateral simultaneous stimulation 2=Profound neglect 0  Total   10     Data reviewed: I personally reviewed the images and agree with the radiology interpretations.  Ct Abdomen Pelvis Wo Contrast 06/27/2017 1. Hematoma within the proximal RIGHT anterior thighis incompletely imaged. Hematoma likely spontaneous related to anticoagulation. Recommend clinical correlation with extent of hematoma in the RIGHT thigh.  2. No intraperitoneal or retroperitoneal hemorrhage.  3. Extensive diverticulosis without diverticulitis.  4. Aortic Atherosclerosis (ICD10-I70.0).  5.Small bilateral pleural effusions.   Ct Angio Head and neck W and Wo Contrast 06/26/2017 1. Emergent large vessel occlusion of the left M1 segment.  2. There is some reconstitution of anterior left MCA branch vessels from pial collaterals.  3. Dense calcifications at the carotid bifurcations bilaterally without significant stenoses relative to the more distal vessels.  4. Moderate narrowing of the proximal posterior cerebral arteries bilaterally, right greater than left.  5. Calcifications at great vessel origins along the arch without significant stenosis. 6. Hypoplastic left vertebral artery.  7. Multilevel cervical spinal spondylosis.  8. Emphysema.  9. Prior granulomatous disease in the right upper lobe.  Cerebral angiogram and thrombectomy - Dr. Estanislado Pandy 06/26/2017 Status post endovascular  complete revascularization of occluded left middle cerebral arterywith 1 pass with Solitaire 4 mm x 40 mm FR retrieval device and a 6 [place] intermediary guide catheter achieving a TICI 3 reperfusion.  Mr Brain Wo Contrast 06/27/2017 Completed acute infarction affecting the majority of the inferior division left MCA distribution.This affects the left  temporal lobe and posterior frontal lobe with patchy involvement in the parietal region.  Infarction of the caudate and putamen as well.  Petechial blood product deposition without focal hematoma.  Swelling but no shift at this time.  Ct Cerebral Perfusion W Contrast 06/26/2017 Left M1 emergent large vessel occlusion with associated acute left MCA territory infarct. Core infarct is estimated at 49 mL.  The tissue at risk is estimated at 182 mL.   Ct Head Code Stroke W/o Cm 06/26/2017 1.Focal hyperdensity in left distal M1 may represent thrombus.  2. No vascular territory infarct identified at this time.  3. Small left frontal subdural hematoma is stable in size and attenuation from 2014.  4. ASPECTS is 10   06/29/2017 1.Evolving large LEFT MCA territory infarct (LEFT basal ganglia, LEFT temporoparietal lobes) with petechial hemorrhage, no hemorrhagic conversion.  2. Acute small volume LEFT cerebrum extra-axial blood products.  3. Chronic changes include old small LEFT frontal subdural hematoma and small LEFT frontal encephalomalacia.   Transthoracic echocardiogram 06/28/2017 Study Conclusions - Left ventricle: The cavity size was normal. There was mild concentric hypertrophy. Systolic function was normal. The  estimated ejection fraction was in the range of 50% to 55%. Hypokinesis of the apical inferior and akinesis of the basal to mid inferior myocardium. Doppler parameters are consistent with indeterminate ventricular filling pressure. - Aortic valve: Valve mobility was restricted. There was mild stenosis. There was mild regurgitation. Peak velocity (S): 300 cm/s. Mean gradient (S): 19 mm Hg. Valve area (VTI): 1.64 cm^2. Valve area (Vmax): 1.49 cm^2. Valve area (Vmean): 1.39 cm^2. Regurgitation pressure half-time: 723 ms. - Mitral valve: Transvalvular velocity was within the normal range. There was no evidence for stenosis. There was no  regurgitation. - Left atrium: The atrium was severely dilated. - Right ventricle: The cavity size was normal. Wall thickness was normal. Systolic function was normal. - Tricuspid valve: There was mild regurgitation. - Pulmonary arteries: Systolic pressure was within the normal range. PA peak pressure: 27 mm Hg (S)  Ct Head Wo Contrast 07/04/2017 IMPRESSION:  1. Unchanged extent of cytotoxic edema within the posterior left MCA territory and left basal ganglia with mass effect on the left lateral ventricle but no midline shift or hydrocephalus.  2. Decreased number of foci petechial hemorrhage. No hemorrhagic conversion/parenchymal hematoma.   LE venous doppler- There is no obviousevidence of deep or superficial vein thrombosis involving the right and left lower extremities. All clearlyvisualized vessels appear patent and compressible. There is no evidence of Baker's cysts bilaterally.  Dg Chest Port 1 View 07/05/2017 IMPRESSION: No change in left basilar changes.   US renal  Mild bilateral hydronephrosis.  Component     Latest Ref Rng & Units 06/27/2017  Cholesterol     0 - 200 mg/dL 115  Triglycerides     <150 mg/dL 86  HDL Cholesterol     >40 mg/dL 20 (L)  Total CHOL/HDL Ratio     RATIO 5.8  VLDL     0 - 40 mg/dL 17  LDL (calc)     0 - 99 mg/dL 78  Hemoglobin A1C     4.8 - 5.6 % 6.8 (H)  Mean  Plasma Glucose     mg/dL 148    Assessment: As you may recall, he is a 83 y.o. Caucasian male with PMH of prostate cancer, CAD, history of GI bleed, DM2, essential tremor, hypertension, history of atrial fibrillation not on AC due to SDH s/p craniectomy and clot evacuation 2014admitted on 06/26/17 for right facial droop, dense right hemiplegia global aphasia, and left gaze deviation. He recevied tPA.CTA showed occluded left distal M1 s/pIR thrombectomy with TICI 3 reperfusion. CT head also showed small left frontal SDh but stable in size since 2014. MRI showed large left  MCA infarct. EF 50-55% with left atrium severely dilated. LDL 78 and A1C 6.8. However, pt developed right femoral pseudoaneurysm with massive sized hematoma post IR due to PFA tear. VVS consulted and had surgical repair with clot evacuation. Patient deemed not an anticoagulation candidate per cardiology due to previous history of SDH s/p crani and evacuation. He was discharged to SNF on ASA 325mg . Still has expressive aphasia, left hemiplegia and left facial droop, wheelchair bound. PT/OT has been stopped due to no significant improvement and lack of cooperation. Passed swallow on soft diet but still has medication from PEG tube. Has not seen VVS for follow up yet. BP today was low in clinic 98/65.   Plan:  - continue ASA and pravastatin for stroke prevention - refer to VVS for right femoral pseudoaneurysm s/p repair follow up and right LE pain - continue speech therapy and remove PEG when appropriate - recommend continued aggressive PT/OT as pt right hemiparesis continues to improve over time - BP low in clinic, and recommend to decrease metoprolol to 50mg  twice a day and continue to monitoring. BP goal 120-140 - Follow up with your primary care physician for stroke risk factor modification. Recommend maintain blood pressure goal <130/80, diabetes with hemoglobin A1c goal below 7.0% and lipids with LDL cholesterol goal below 70 mg/dL.  - follow up in 3 months.   I spent more than 25 minutes of face to face time with the patient. Greater than 50% of time was spent in counseling and coordination of care.    Orders Placed This Encounter  Procedures  . Ambulatory referral to Vascular Surgery    Referral Priority:   Routine    Referral Type:   Surgical    Referral Reason:   Specialty Services Required    Requested Specialty:   Vascular Surgery    Number of Visits Requested:   1    Meds ordered this encounter  Medications  . KLOR-CON M20 20 MEQ tablet    Sig: TAKE 1 TABLET (20 MEQ TOTAL) BY  MOUTH DAILY AS NEEDED.    Refill:  3  . PROTONIX 40 MG PACK  . levofloxacin (LEVAQUIN) 500 MG tablet  . metoCLOPramide (REGLAN) 5 MG tablet    Sig: Place into feeding tube.     Patient Instructions  - continue ASA and pravastatin for stroke prevention - follow up with vascular surgery for right femoral pseudoaneurysm s/p repair and right LE pain. - ordered - continue speech therapy and remove PEG when appropriate - recommend continued aggressive PT/OT as pt right hemiparesis continues to improve over time - BP low in clinic, and recommend to decrease metoprolol to 50mg  twice a day and continue to monitoring. BP goal 120-140 - Follow up with your primary care physician for stroke risk factor modification. Recommend maintain blood pressure goal <130/80, diabetes with hemoglobin A1c goal below 7.0% and lipids with LDL cholesterol goal below  70 mg/dL.  - supportive care - follow up in 3 months.    Rosalin Hawking, MD PhD [facility] [address] [place], [place] [address] [phone]

## 2017-09-17 NOTE — Patient Instructions (Addendum)
-   continue ASA and pravastatin for stroke prevention - follow up with vascular surgery for right femoral pseudoaneurysm s/p repair and right LE pain. - ordered - continue speech therapy and remove PEG when appropriate - recommend continued aggressive PT/OT as pt right hemiparesis continues to improve over time - BP low in clinic, and recommend to decrease metoprolol to 50mg  twice a day and continue to monitoring. BP goal 120-140 - Follow up with your primary care physician for stroke risk factor modification. Recommend maintain blood pressure goal <130/80, diabetes with hemoglobin A1c goal below 7.0% and lipids with LDL cholesterol goal below 70 mg/dL.  - supportive care - follow up in 3 months.

## 2017-09-19 DIAGNOSIS — G8191 Hemiplegia, unspecified affecting right dominant side: Secondary | ICD-10-CM | POA: Insufficient documentation

## 2017-09-20 ENCOUNTER — Other Ambulatory Visit: Payer: Self-pay

## 2017-09-20 DIAGNOSIS — I724 Aneurysm of artery of lower extremity: Secondary | ICD-10-CM

## 2017-09-26 NOTE — Progress Notes (Signed)
    Postoperative Visit   History of Present Illness   Robert Keith is a 83 y.o. year old male who presents for postoperative follow-up for: R groin exploration and evacuation of thigh hematoma, repair of R PFA (06/27/17).  R groin has healed.  Pt is recovering from his CVA with significant residual neuro deficits.   Physical Examination   Vitals:   09/27/17 1314  BP: 95/62  Pulse: 76  Resp: 14  Temp: (!) 97 F (36.1 C)  SpO2: 98%  Weight: 173 lb (78.5 kg)  Height: 6' (1.829 m)   R groin: inc well healed  R groin duplex (09/27/2017): no residual PSA, patent femoral arteries and vein   Medical Decision Making   Robert Keith is a 83 y.o. year old male who presents s/p R groin exploration and evacuation of thigh hematoma, repair of R PFA for hemorrhagic shock from R groin cannulation for mech thrombectomy R MCA.  Given pt significant decrease in functional status, I would manage any underlying PAD with medical mgmt. Nothing more to offer from a vascular viewpoint. Thank you for allowing [place] to participate in this patient's care.   Adele Barthel, MD, FACS [facility] Office: [phone] Pager: [phone]

## 2017-09-27 ENCOUNTER — Ambulatory Visit (HOSPITAL_COMMUNITY)
Admission: RE | Admit: 2017-09-27 | Discharge: 2017-09-27 | Disposition: A | Discharge status: Home / Self Care | Payer: Medicare Other | Source: Ambulatory Visit | Attending: Vascular Surgery | Admitting: Vascular Surgery

## 2017-09-27 ENCOUNTER — Ambulatory Visit (INDEPENDENT_AMBULATORY_CARE_PROVIDER_SITE_OTHER): Payer: Self-pay | Admitting: Vascular Surgery

## 2017-09-27 ENCOUNTER — Encounter: Payer: Self-pay | Admitting: Vascular Surgery

## 2017-09-27 VITALS — BP 95/62 | HR 76 | Temp 97.0°F | Resp 14 | Ht 72.0 in | Wt 173.0 lb

## 2017-09-27 DIAGNOSIS — I724 Aneurysm of artery of lower extremity: Secondary | ICD-10-CM | POA: Insufficient documentation

## 2017-09-27 DIAGNOSIS — T81718A Complication of other artery following a procedure, not elsewhere classified, initial encounter: Secondary | ICD-10-CM

## 2017-10-03 ENCOUNTER — Ambulatory Visit: Payer: Medicare Other | Admitting: Cardiovascular Disease

## 2017-10-25 ENCOUNTER — Other Ambulatory Visit: Payer: Self-pay

## 2017-10-29 ENCOUNTER — Other Ambulatory Visit: Payer: Self-pay

## 2017-10-30 ENCOUNTER — Other Ambulatory Visit: Payer: Self-pay

## 2017-10-30 NOTE — Patient Outreach (Signed)
First/Second/Third attempt to obtain mRS. No answer. Left message for returned call. mRs = 7

## 2017-11-09 DIAGNOSIS — I2510 Atherosclerotic heart disease of native coronary artery without angina pectoris: Secondary | ICD-10-CM | POA: Insufficient documentation

## 2017-11-09 NOTE — Progress Notes (Deleted)
Cardiology Office Note  Date:  11/09/2017   ID:  Robert Keith, DOB [birth year], MRN [id number]  PCP:  Robert Ramsay, MD   No chief complaint on file.   HPI:  Robert Keith Is a very pleasant 83 year old gentleman with PUD, NIDDM, HTN, CAD s/p CABG in 1996 and hx of SDH while on aspirin,prostate cancer 11 year ,  recent admission to the hospital with upper GI bleed and acute blood loss anemia October 2017, who presents by referral from Robert Keith for evaluation of his atrial fibrillation  Review of previous EKG shows atrial fibrillation/flutter dating back to January 2016 He reports having a fall, presented to the hospital EKG documenting arrhythmia  follow-up studies done through [facility]12/2016: stress test: Showing atrial fibrillation  He reports that he was never told he was in atrial fibrillation in 2016, only recently at [facility]. In general is asymptomatic Family presents with him today and wonders if this could be contributing to his shortness of breath he has been taking HCTZ every other day for ankle swelling  History of subdural hematoma some time  before 2000 per the patient, requiring surgery  "drilled into brain to relieve pressure" At the time he was taking aspirin/Excedrin on a daily basis for migraine symptoms   he is followed by Neurology Robert Keith  last MRI in 2016 with residual blood   he reports that he takes propranolol every evening for Essential tremor  previously when he took propranolol in the morning he had a fall  Recent MRI: 2016 reviewed with him  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 without significant mass effect. Advanced cortical atrophy with prominent midbrain volume loss.  Early 09/2016 he developed abdominal pain and GI bleed.   diagnosed with duodenal stenosis.   had a ct and EGD  treated with PPI   Echo v10/3/17 - Akinesis of the distal inferior wall with overall preserved  LV systolic function; elevated LV filling pressure; calcified aortic valve with very mild AS; mild AI; mild MR; moderate LAE; moderately reduced RV function; mild TR with moderately elevated pulmonary pressure.   EKG on today's visit shows atrial fibrillation with rate 74 bpm, right bundle branch block, unable to exclude old inferior MI   PMH:   has a past medical history of Anemia, Cancer ([place]), Chronic atrial fibrillation ([place]), Coronary artery disease, Diabetes mellitus without complication ([place]), Essential tremor, GI bleed (09/2016), H. pylori infection, H/O craniotomy, Hypertension, Intracranial hemorrhage ([place]), Prostate cancer ([place]), RBBB, SDH (subdural hematoma) ([place]), and Stroke ([place]).  PSH:    Past Surgical History:  Procedure Laterality Date  . brain surgery for bleed    . cardiac bypass    . CORONARY ARTERY BYPASS GRAFT    . ESOPHAGOGASTRODUODENOSCOPY N/A 07/12/2017   Procedure: ESOPHAGOGASTRODUODENOSCOPY (EGD);  Surgeon: [name], MD;  Location: [facility];  Service: General;  Laterality: N/A;  . ESOPHAGOGASTRODUODENOSCOPY (EGD) WITH PROPOFOL N/A 09/18/2016   Procedure: ESOPHAGOGASTRODUODENOSCOPY (EGD) WITH PROPOFOL;  Surgeon: [name], MD;  Location: [facility];  Service: Endoscopy;  Laterality: N/A;  . FEMORAL ARTERY EXPLORATION Right 06/27/2017   Procedure: FEMORAL ARTERY EXPLORATION;  Surgeon: Conrad Pipestone, MD;  Location: [facility];  Service: Vascular;  Laterality: Right;  . IR PERCUTANEOUS ART THROMBECTOMY/INFUSION INTRACRANIAL INC DIAG ANGIO  06/26/2017  . PEG PLACEMENT N/A 07/12/2017   Procedure: PERCUTANEOUS ENDOSCOPIC GASTROSTOMY (PEG) PLACEMENT;  Surgeon: [name], MD;  Location: [facility];  Service: General;  Laterality: N/A;  .  RADIOLOGY WITH ANESTHESIA N/A 06/26/2017   Procedure: RADIOLOGY WITH ANESTHESIA CODE STROKE;  Surgeon: [name], MD;  Location: [facility];  Service: Radiology;  Laterality: N/A;    Current Outpatient  Medications  Medication Sig Dispense Refill  . acetaminophen (TYLENOL) 500 MG tablet Take 500 mg by mouth every 8 (eight) hours as needed.    . Amino Acids-Protein Hydrolys (FEEDING SUPPLEMENT, PRO-STAT SUGAR FREE 64,) LIQD Place 30 mLs into feeding tube 2 (two) times daily. 900 mL 0  . amLODipine (NORVASC) 5 MG tablet Place 1 tablet (5 mg total) into feeding tube daily. 30 tablet 0  . aspirin 325 MG tablet Place 1 tablet (325 mg total) into feeding tube daily. 30 tablet 0  . clotrimazole (LOTRIMIN) 1 % cream Apply 1 application topically 2 (two) times daily.   1  . famotidine (PEPCID) 40 MG tablet Place 1 tablet (40 mg total) into feeding tube 2 (two) times daily before a meal. 30 tablet 1  . glipiZIDE (GLUCOTROL) 10 MG tablet Place 1 tablet (10 mg total) into feeding tube 2 (two) times daily with a meal. 60 tablet 0  . KLOR-CON M20 20 MEQ tablet TAKE 1 TABLET (20 MEQ TOTAL) BY MOUTH DAILY AS NEEDED.  3  . levofloxacin (LEVAQUIN) 500 MG tablet     . loratadine (CLARITIN) 10 MG tablet Place 1 tablet (10 mg total) into feeding tube daily. 30 tablet 0  . MEGARED OMEGA-3 KRILL OIL PO 1 capsule by PEG Tube route daily.     . metoCLOPramide (REGLAN) 5 MG tablet Place into feeding tube.     . metoprolol tartrate (LOPRESSOR) 100 MG tablet Place 1 tablet (100 mg total) into feeding tube 2 (two) times daily. 60 tablet 0  . nystatin (MYCOSTATIN) 100000 UNIT/ML suspension Use as directed 5 mLs (500,000 Units total) in the mouth or throat 4 (four) times daily. 60 mL 0  . pioglitazone (ACTOS) 15 MG tablet Place 1 tablet (15 mg total) into feeding tube daily with breakfast. 30 tablet 0  . pravastatin (PRAVACHOL) 40 MG tablet Place 1 tablet (40 mg total) into feeding tube at bedtime. 30 tablet 0  . propranolol (INDERAL) 40 MG tablet Place 1 tablet (40 mg total) into feeding tube 2 (two) times daily. 60 tablet 2  . PROTONIX 40 MG PACK     . senna-docusate (SENOKOT-S) 8.6-50 MG tablet Place 1 tablet into feeding  tube at bedtime as needed for mild constipation. 30 tablet 0  . Water For Irrigation, Sterile (FREE WATER) SOLN Place 300 mLs into feeding tube every 6 (six) hours. 1000 mL 0   No current facility-administered medications for this visit.      Allergies:   Metformin and related; Tape; Bupropion; Fenofibrate micronized; Gemfibrozil; Niacin; Phenobarbital; and Simvastatin   Social History:  The patient  reports that he has quit smoking. he has never used smokeless tobacco. He reports that he does not drink alcohol or use drugs.   Family History:   family history includes Heart attack in his father; Stroke in his brother and mother.    Review of Systems: Review of Systems  Constitutional: Negative.   Respiratory: Positive for shortness of breath.   Cardiovascular: Positive for leg swelling.  Gastrointestinal: Negative.   Musculoskeletal: Negative.        Unsteady gait  Neurological: Negative.   Psychiatric/Behavioral: Negative.   All other systems reviewed and are negative.    PHYSICAL EXAM: VS:  There were no vitals taken for this  visit. , BMI There is no height or weight on file to calculate BMI. GEN: Well nourished, well developed, in no acute distress  HEENT: normal  Neck: no JVD, carotid bruits, or masses Cardiac. Irregularly irregular,no murmurs, rubs, or gallops,no edema  Respiratory:  clear to auscultation bilaterally, normal work of breathing GI: soft, nontender, nondistended, + BS MS: no deformity or atrophy  Skin: warm and dry, no rash Neuro:  Strength and sensation are intact Psych: euthymic mood, full affect    Recent Labs: 06/26/2017: ALT 33 07/05/2017: Magnesium 2.8 07/17/2017: BUN 27; Creatinine, Ser 0.79; Hemoglobin 9.0; Platelets 265; Potassium 3.8; Sodium 137    Lipid Panel Lab Results  Component Value Date   CHOL 115 06/27/2017   HDL 20 (L) 06/27/2017   LDLCALC 78 06/27/2017   TRIG 86 06/27/2017      Wt Readings from Last 3 Encounters:  09/27/17  173 lb (78.5 kg)  09/17/17 180 lb (81.6 kg)  07/17/17 192 lb 10.9 oz (87.4 kg)       ASSESSMENT AND PLAN:  Coronary artery disease due to lipid rich plaque s/p CABG 1996 - Plan: EKG 12-Lead Currently with no symptoms of angina. No further workup at this time. Continue current medication regimen.  Essential hypertension - Plan: EKG 12-Lead Blood pressure running low Recommended he decrease metoprolol down to 50 mg twice a day for any lightheadedness or dizziness  History of subdural hematoma Residual blood seen on MRI scan last year Long discussion concerning history of subdural hematoma, risk and benefit of anticoagulation Unclear if he is a candidate for aspirin, Plavix or both If he is a candidate, he possibly might benefit from a Watchman left atrial appendage closure device. Likely not a candidate for warfarin or NOAC given prior history  Chronic atrial fibrillation ([place]) First noted to have arrhythmia genera 2016, has been asymptomatic apart from leg edema, shortness of breath on exertion. Recent echocardiogram with elevated right heart pressures. Recommended he take Lasix with potassium 2 days per week. Suggested he stay on HCTZ every other day as he is doing currently  Type 2 diabetes mellitus with diabetic neuropathy, without long-term current use of insulin ([place]) Managed by primary care  GI bleed Recent GI bleed early October 2007, duodenal stricture He reports symptoms improved on PPI Discussed risk and benefit of anticoagulation   Total encounter time more than 45 minutes  Greater than 50% was spent in counseling and coordination of care with the patient   Disposition:   F/U  6 months   No orders of the defined types were placed in this encounter.    Signed, Esmond Plants, M.D., Ph.D. 11/09/2017  [place], [place]

## 2017-11-11 ENCOUNTER — Ambulatory Visit: Payer: Medicare Other | Admitting: Cardiovascular Disease

## 2017-12-03 LAB — COMPLETE METABOLIC PANEL WITHOUT GFR
ALT: 284
AST: 374
BUN: 70
Creat: 1.43

## 2017-12-04 ENCOUNTER — Inpatient Hospital Stay (HOSPITAL_COMMUNITY)
Admission: EM | Admit: 2017-12-04 | Discharge: 2017-12-12 | DRG: 689 | Disposition: A | Discharge status: Skilled Nursing Facility | Payer: Medicare Other | Attending: Internal Medicine | Admitting: Internal Medicine

## 2017-12-04 ENCOUNTER — Emergency Department (HOSPITAL_COMMUNITY): Payer: Medicare Other

## 2017-12-04 DIAGNOSIS — I69328 Other speech and language deficits following cerebral infarction: Secondary | ICD-10-CM

## 2017-12-04 DIAGNOSIS — I959 Hypotension, unspecified: Secondary | ICD-10-CM | POA: Diagnosis not present

## 2017-12-04 DIAGNOSIS — D539 Nutritional anemia, unspecified: Secondary | ICD-10-CM | POA: Diagnosis present

## 2017-12-04 DIAGNOSIS — R740 Nonspecific elevation of levels of transaminase and lactic acid dehydrogenase [LDH]: Secondary | ICD-10-CM

## 2017-12-04 DIAGNOSIS — D509 Iron deficiency anemia, unspecified: Secondary | ICD-10-CM | POA: Diagnosis present

## 2017-12-04 DIAGNOSIS — E871 Hypo-osmolality and hyponatremia: Secondary | ICD-10-CM | POA: Diagnosis present

## 2017-12-04 DIAGNOSIS — I2510 Atherosclerotic heart disease of native coronary artery without angina pectoris: Secondary | ICD-10-CM | POA: Diagnosis present

## 2017-12-04 DIAGNOSIS — I482 Chronic atrial fibrillation, unspecified: Secondary | ICD-10-CM | POA: Diagnosis present

## 2017-12-04 DIAGNOSIS — K7689 Other specified diseases of liver: Secondary | ICD-10-CM | POA: Diagnosis present

## 2017-12-04 DIAGNOSIS — Z951 Presence of aortocoronary bypass graft: Secondary | ICD-10-CM

## 2017-12-04 DIAGNOSIS — I69319 Unspecified symptoms and signs involving cognitive functions following cerebral infarction: Secondary | ICD-10-CM

## 2017-12-04 DIAGNOSIS — Z66 Do not resuscitate: Secondary | ICD-10-CM | POA: Diagnosis present

## 2017-12-04 DIAGNOSIS — N3090 Cystitis, unspecified without hematuria: Secondary | ICD-10-CM | POA: Diagnosis not present

## 2017-12-04 DIAGNOSIS — I69351 Hemiplegia and hemiparesis following cerebral infarction affecting right dominant side: Secondary | ICD-10-CM

## 2017-12-04 DIAGNOSIS — E1140 Type 2 diabetes mellitus with diabetic neuropathy, unspecified: Secondary | ICD-10-CM | POA: Diagnosis present

## 2017-12-04 DIAGNOSIS — I63412 Cerebral infarction due to embolism of left middle cerebral artery: Secondary | ICD-10-CM | POA: Diagnosis present

## 2017-12-04 DIAGNOSIS — G9341 Metabolic encephalopathy: Secondary | ICD-10-CM | POA: Diagnosis present

## 2017-12-04 DIAGNOSIS — G250 Essential tremor: Secondary | ICD-10-CM | POA: Diagnosis present

## 2017-12-04 DIAGNOSIS — R4701 Aphasia: Secondary | ICD-10-CM | POA: Diagnosis present

## 2017-12-04 DIAGNOSIS — I2583 Coronary atherosclerosis due to lipid rich plaque: Secondary | ICD-10-CM | POA: Diagnosis present

## 2017-12-04 DIAGNOSIS — B965 Pseudomonas (aeruginosa) (mallei) (pseudomallei) as the cause of diseases classified elsewhere: Secondary | ICD-10-CM | POA: Diagnosis present

## 2017-12-04 DIAGNOSIS — R1310 Dysphagia, unspecified: Secondary | ICD-10-CM | POA: Diagnosis present

## 2017-12-04 DIAGNOSIS — E870 Hyperosmolality and hypernatremia: Secondary | ICD-10-CM | POA: Diagnosis present

## 2017-12-04 DIAGNOSIS — E43 Unspecified severe protein-calorie malnutrition: Secondary | ICD-10-CM | POA: Diagnosis present

## 2017-12-04 DIAGNOSIS — Z91048 Other nonmedicinal substance allergy status: Secondary | ICD-10-CM

## 2017-12-04 DIAGNOSIS — Z87891 Personal history of nicotine dependence: Secondary | ICD-10-CM

## 2017-12-04 DIAGNOSIS — N179 Acute kidney failure, unspecified: Secondary | ICD-10-CM | POA: Diagnosis present

## 2017-12-04 DIAGNOSIS — R410 Disorientation, unspecified: Secondary | ICD-10-CM

## 2017-12-04 DIAGNOSIS — R7401 Elevation of levels of liver transaminase levels: Secondary | ICD-10-CM

## 2017-12-04 DIAGNOSIS — Z7984 Long term (current) use of oral hypoglycemic drugs: Secondary | ICD-10-CM

## 2017-12-04 DIAGNOSIS — K5900 Constipation, unspecified: Secondary | ICD-10-CM | POA: Diagnosis present

## 2017-12-04 DIAGNOSIS — Z888 Allergy status to other drugs, medicaments and biological substances status: Secondary | ICD-10-CM

## 2017-12-04 DIAGNOSIS — N390 Urinary tract infection, site not specified: Secondary | ICD-10-CM | POA: Diagnosis present

## 2017-12-04 DIAGNOSIS — Z8679 Personal history of other diseases of the circulatory system: Secondary | ICD-10-CM

## 2017-12-04 DIAGNOSIS — L89152 Pressure ulcer of sacral region, stage 2: Secondary | ICD-10-CM | POA: Diagnosis present

## 2017-12-04 DIAGNOSIS — E860 Dehydration: Secondary | ICD-10-CM | POA: Diagnosis present

## 2017-12-04 DIAGNOSIS — R748 Abnormal levels of other serum enzymes: Secondary | ICD-10-CM | POA: Diagnosis present

## 2017-12-04 DIAGNOSIS — E119 Type 2 diabetes mellitus without complications: Secondary | ICD-10-CM | POA: Diagnosis present

## 2017-12-04 DIAGNOSIS — Z8546 Personal history of malignant neoplasm of prostate: Secondary | ICD-10-CM

## 2017-12-04 DIAGNOSIS — Z931 Gastrostomy status: Secondary | ICD-10-CM

## 2017-12-04 DIAGNOSIS — R443 Hallucinations, unspecified: Secondary | ICD-10-CM | POA: Diagnosis present

## 2017-12-04 DIAGNOSIS — K9423 Gastrostomy malfunction: Secondary | ICD-10-CM

## 2017-12-04 DIAGNOSIS — G9340 Encephalopathy, unspecified: Secondary | ICD-10-CM | POA: Diagnosis present

## 2017-12-04 DIAGNOSIS — I10 Essential (primary) hypertension: Secondary | ICD-10-CM | POA: Diagnosis present

## 2017-12-04 LAB — CBC
HCT: 38.9 % — ABNORMAL LOW (ref 39.0–52.0)
Hemoglobin: 12.4 g/dL — ABNORMAL LOW (ref 13.0–17.0)
MCH: 31.5 pg (ref 26.0–34.0)
MCHC: 31.9 g/dL (ref 30.0–36.0)
MCV: 98.7 fL (ref 78.0–100.0)
PLATELETS: 255 10*3/uL (ref 150–400)
RBC: 3.94 MIL/uL — ABNORMAL LOW (ref 4.22–5.81)
RDW: 15.7 % — AB (ref 11.5–15.5)
WBC: 9.5 10*3/uL (ref 4.0–10.5)

## 2017-12-04 LAB — COMPREHENSIVE METABOLIC PANEL
ALK PHOS: 270 U/L — AB (ref 38–126)
ALT: 348 U/L — AB (ref 17–63)
AST: 442 U/L — AB (ref 15–41)
Albumin: 2.6 g/dL — ABNORMAL LOW (ref 3.5–5.0)
Anion gap: 7 (ref 5–15)
BILIRUBIN TOTAL: 0.7 mg/dL (ref 0.3–1.2)
BUN: 66 mg/dL — AB (ref 6–20)
CALCIUM: 8.9 mg/dL (ref 8.9–10.3)
CHLORIDE: 123 mmol/L — AB (ref 101–111)
CO2: 21 mmol/L — ABNORMAL LOW (ref 22–32)
CREATININE: 1.5 mg/dL — AB (ref 0.61–1.24)
GFR, EST AFRICAN AMERICAN: 48 mL/min — AB (ref >60)
GFR, EST NON AFRICAN AMERICAN: 42 mL/min — AB (ref >60)
Glucose, Bld: 138 mg/dL — ABNORMAL HIGH (ref 65–99)
Potassium: 3.6 mmol/L (ref 3.5–5.1)
Sodium: 151 mmol/L — ABNORMAL HIGH (ref 135–145)
TOTAL PROTEIN: 8.5 g/dL — AB (ref 6.5–8.1)

## 2017-12-04 LAB — URINALYSIS, ROUTINE W REFLEX MICROSCOPIC
Bilirubin Urine: NEGATIVE
Glucose, UA: NEGATIVE mg/dL
Ketones, ur: NEGATIVE mg/dL
Nitrite: NEGATIVE
PH: 6 (ref 5.0–8.0)
Protein, ur: 30 mg/dL — AB
SPECIFIC GRAVITY, URINE: 1.009 (ref 1.005–1.030)
SQUAMOUS EPITHELIAL / LPF: NONE SEEN

## 2017-12-04 LAB — LIPASE, BLOOD: Lipase: 22 U/L (ref 11–51)

## 2017-12-04 LAB — AMMONIA: AMMONIA: 16 umol/L (ref 9–35)

## 2017-12-04 LAB — APTT: aPTT: 32 seconds (ref 24–36)

## 2017-12-04 LAB — PROTIME-INR
INR: 1.24
Prothrombin Time: 15.5 seconds — ABNORMAL HIGH (ref 11.4–15.2)

## 2017-12-04 LAB — TROPONIN I: TROPONIN I: 0.03 ng/mL — AB (ref <0.03)

## 2017-12-04 MED ORDER — IOPAMIDOL (ISOVUE-300) INJECTION 61%
INTRAVENOUS | Status: AC
  Filled 2017-12-04: qty 30

## 2017-12-04 MED ORDER — DEXTROSE 5 % IV SOLN
1.0000 g | Freq: Once | INTRAVENOUS | Status: AC
  Administered 2017-12-04: 1 g via INTRAVENOUS
  Filled 2017-12-04: qty 10

## 2017-12-04 MED ORDER — IOPAMIDOL (ISOVUE-300) INJECTION 61%
INTRAVENOUS | Status: AC
  Administered 2017-12-04: 100 mL
  Filled 2017-12-04: qty 75

## 2017-12-04 MED ORDER — LIDOCAINE HCL 2 % EX GEL
1.0000 "application " | Freq: Once | CUTANEOUS | Status: AC
  Administered 2017-12-05: 1 via TOPICAL
  Filled 2017-12-04: qty 20

## 2017-12-04 MED ORDER — SODIUM CHLORIDE 0.9 % IV BOLUS (SEPSIS)
1000.0000 mL | Freq: Once | INTRAVENOUS | Status: AC
  Administered 2017-12-04: 1000 mL via INTRAVENOUS

## 2017-12-04 NOTE — ED Triage Notes (Signed)
Pt BIB [facility] from [facility]. 2-3 days of abdominal pain and poor intake. Facility gave 1.5 L of NS and did labs (at bedside). Elevated BUN, Creatinine, LFTs. Pt has hx of CVA with right sided deficits including aphasia.

## 2017-12-04 NOTE — ED Provider Notes (Signed)
[facility] EMERGENCY DEPARTMENT Provider Note   CSN: [id number] Arrival date & time: 12/04/17  2045     History   Chief Complaint Chief Complaint  Patient presents with  . Abdominal Pain    HPI Robert Keith is a 83 y.o. male.  HPI    Level V caveat due to altered mental status.   Robert Keith is a 83 y.o. male, with a history of prostate cancer, CAD, A. fib, DM, CVA, intracranial hemorrhage, presenting to the ED with decreased oral intake since 12/15. Accompanied by caregiver and POA, sherry. [name], stepson, also at the bedside.  Notes confusion and decreased ability to communicate with incomprehensible speech.  Reaches out for unseen objects. Usually has chronically loose stools due to the PEG tube supplement. Stool more firm over the past week, but decreased in amount over past week. Usually has daily BM, but no BM since 12/15. CVA June 26, 2017 causing right side weakness and aphasia. PEG tube in place, but patient has almost exclusively PO intake. Does note a possible indication of abdominal pain with coughing.  Abdominal xray noted a "blockage" 2-3 weeks ago. States they gave the patient "some kind of medicine for it."  No noted fever, vomiting, abdominal distention, hematochezia/melena, productive cough, recent falls or trauma, or wounds.   Past Medical History:  Diagnosis Date  . Anemia   . Cancer [facility])    prostate  . Chronic atrial fibrillation ([place])   . Coronary artery disease    a. s/p CABG [facility] 1996.  . Diabetes mellitus without complication ([place])   . Essential tremor   . GI bleed 09/2016  . H. pylori infection   . H/O craniotomy   . Hypertension   . Intracranial hemorrhage ([place])   . Prostate cancer ([place])   . RBBB   . SDH (subdural hematoma) ([place])   . Stroke [facility])     Patient Active Problem List   Diagnosis Date Noted  . Acute lower UTI 12/05/2017  . Acute encephalopathy 12/05/2017  . Subcapsular hematoma of liver 12/05/2017  .  UTI (urinary tract infection) 12/05/2017  . Abnormal transaminases 12/05/2017  . CAD (coronary artery disease), native coronary artery 11/09/2017  . Right hemiplegia ([place]) 09/19/2017  . Pleural effusion   . Leukocytosis   . Hypernatremia   . AKI (acute kidney injury) ([place])   . Acute respiratory failure ([place])   . Aphasia   . Facial droop   . Chronic subdural hematoma ([place]) 06/27/2017  . Hematoma of thigh, right, initial encounter 06/27/2017  . Cytotoxic brain edema ([place]) 06/27/2017  . Pseudoaneurysm of femoral artery following procedure ([place]) 06/27/2017  . Hemorrhagic shock ([place])   . Cerebrovascular accident (CVA) due to embolism of left middle cerebral artery ([place]) 06/26/2017  . Acute ischemic stroke ([place]) 06/26/2017  . Duodenal ulcer disease   . Acute blood loss anemia 09/17/2016  . Coronary artery disease due to lipid rich plaque s/p CABG 1996 09/17/2016  . Chronic atrial fibrillation ([place]) 09/17/2016  . History of subdural hematoma 09/17/2016  . Peptic ulcer disease 09/17/2016  . Type 2 diabetes mellitus with diabetic neuropathy, without long-term current use of insulin ([place]) 09/17/2016  . Essential hypertension 09/17/2016    Past Surgical History:  Procedure Laterality Date  . brain surgery for bleed    . cardiac bypass    . CORONARY ARTERY BYPASS GRAFT    . ESOPHAGOGASTRODUODENOSCOPY N/A 07/12/2017   Procedure: ESOPHAGOGASTRODUODENOSCOPY (EGD);  Surgeon: [name], MD;  Location: [facility] ENDOSCOPY;  Service: General;  Laterality: N/A;  . ESOPHAGOGASTRODUODENOSCOPY (EGD) WITH PROPOFOL N/A 09/18/2016   Procedure: ESOPHAGOGASTRODUODENOSCOPY (EGD) WITH PROPOFOL;  Surgeon: [name], MD;  Location: [facility] ENDOSCOPY;  Service: Endoscopy;  Laterality: N/A;  . FEMORAL ARTERY EXPLORATION Right 06/27/2017   Procedure: FEMORAL ARTERY EXPLORATION;  Surgeon: Conrad Kerrick, MD;  Location: [place];  Service: Vascular;  Laterality: Right;  . IR PERCUTANEOUS ART THROMBECTOMY/INFUSION  INTRACRANIAL INC DIAG ANGIO  06/26/2017  . PEG PLACEMENT N/A 07/12/2017   Procedure: PERCUTANEOUS ENDOSCOPIC GASTROSTOMY (PEG) PLACEMENT;  Surgeon: [name], MD;  Location: [place];  Service: General;  Laterality: N/A;  . RADIOLOGY WITH ANESTHESIA N/A 06/26/2017   Procedure: RADIOLOGY WITH ANESTHESIA CODE STROKE;  Surgeon: [name], MD;  Location: [place];  Service: Radiology;  Laterality: N/A;       Home Medications    Prior to Admission medications   Medication Sig Start Date End Date Taking? Authorizing Provider  acetaminophen (TYLENOL) 500 MG tablet Take 500 mg by mouth every 8 (eight) hours as needed.    [provider]  Amino Acids-Protein Hydrolys (FEEDING SUPPLEMENT, PRO-STAT SUGAR FREE 64,) LIQD Place 30 mLs into feeding tube 2 (two) times daily. 07/17/17   Patteson, Arlan Organ, NP  amLODipine (NORVASC) 5 MG tablet Place 1 tablet (5 mg total) into feeding tube daily. 07/18/17   Patteson, Arlan Organ, NP  aspirin 325 MG tablet Place 1 tablet (325 mg total) into feeding tube daily. 07/18/17   Patteson, Arlan Organ, NP  clotrimazole (LOTRIMIN) 1 % cream Apply 1 application topically 2 (two) times daily.  06/20/17   [provider]  famotidine (PEPCID) 40 MG tablet Place 1 tablet (40 mg total) into feeding tube 2 (two) times daily before a meal. 07/17/17 07/17/18  Patteson, Arlan Organ, NP  glipiZIDE (GLUCOTROL) 10 MG tablet Place 1 tablet (10 mg total) into feeding tube 2 (two) times daily with a meal. 07/17/17   Patteson, Samuel A, NP  KLOR-CON M20 20 MEQ tablet TAKE 1 TABLET (20 MEQ TOTAL) BY MOUTH DAILY AS NEEDED. 06/11/17   [provider]  levofloxacin (LEVAQUIN) 500 MG tablet  08/19/17   [provider]  loratadine (CLARITIN) 10 MG tablet Place 1 tablet (10 mg total) into feeding tube daily. 07/17/17   Patteson, Arlan Organ, NP  MEGARED OMEGA-3 KRILL OIL PO 1 capsule by PEG Tube route daily.     [provider]  metoCLOPramide (REGLAN) 5 MG tablet Place  into feeding tube.  08/29/17   [provider]  metoprolol tartrate (LOPRESSOR) 100 MG tablet Place 1 tablet (100 mg total) into feeding tube 2 (two) times daily. 07/17/17   Patteson, Arlan Organ, NP  nystatin (MYCOSTATIN) 100000 UNIT/ML suspension Use as directed 5 mLs (500,000 Units total) in the mouth or throat 4 (four) times daily. 07/17/17   Patteson, Arlan Organ, NP  pioglitazone (ACTOS) 15 MG tablet Place 1 tablet (15 mg total) into feeding tube daily with breakfast. 07/18/17   Patteson, Arlan Organ, NP  pravastatin (PRAVACHOL) 40 MG tablet Place 1 tablet (40 mg total) into feeding tube at bedtime. 07/17/17   Patteson, Arlan Organ, NP  propranolol (INDERAL) 40 MG tablet Place 1 tablet (40 mg total) into feeding tube 2 (two) times daily. 07/17/17   Charm Rings, NP  PROTONIX 40 MG PACK  08/23/17   [provider]  senna-docusate (SENOKOT-S) 8.6-50 MG tablet Place 1 tablet into feeding tube at bedtime as needed for  mild constipation. 07/17/17   Patteson, Arlan Organ, NP  Water For Irrigation, Sterile (FREE WATER) SOLN Place 300 mLs into feeding tube every 6 (six) hours. 07/17/17   Patteson, Arlan Organ, NP    Family History Family History  Problem Relation Age of Onset  . Stroke Mother   . Heart attack Father   . Stroke Brother     Social History Social History   Tobacco Use  . Smoking status: Former Research scientist (life sciences)  . Smokeless tobacco: Never Used  Substance Use Topics  . Alcohol use: No  . Drug use: No     Allergies   Metformin and related; Tape; Bupropion; Fenofibrate micronized; Gemfibrozil; Niacin; Phenobarbital; and Simvastatin   Review of Systems Review of Systems  Unable to perform ROS: Mental status change  Constitutional: Positive for appetite change. Negative for fever.  Gastrointestinal: Positive for constipation. Negative for abdominal distention.  Psychiatric/Behavioral: Positive for confusion.     Physical Exam Updated Vital Signs BP 122/87 (BP Location: Right Arm)    Pulse 99   Temp (!) 97.5 F (36.4 C) (Oral)   Resp 19   SpO2 97%   Physical Exam  Constitutional: He appears well-developed and well-nourished. No distress.  HENT:  Head: Normocephalic and atraumatic.  Eyes: Conjunctivae are normal.  Neck: Neck supple.  Cardiovascular: Normal rate, regular rhythm, normal heart sounds and intact distal pulses.  Pulmonary/Chest: Effort normal and breath sounds normal. No respiratory distress.  Abdominal: Soft. Bowel sounds are normal. He exhibits no distension. There is generalized tenderness (questionable). There is no guarding.  Patient grunts with palpation of the abdomen with no noted area of specific point tenderness. No noted distention, erythema, or ecchymosis.  Genitourinary: Testes normal.  Genitourinary Comments: Purulent appearing discharge noted from urethra.  No noted lesions or wounds.  No erythema, edema, or other signs of external infection.  Stage I-II pressure ulcer to the central coccyx region.  Musculoskeletal: He exhibits no edema.  Lymphadenopathy:    He has no cervical adenopathy.  Neurological: He is alert.  Speech is incomprehensible.  Skin: Skin is warm and dry. Capillary refill takes less than 2 seconds. He is not diaphoretic.  Psychiatric: He has a normal mood and affect. His behavior is normal.  Nursing note and vitals reviewed.    ED Treatments / Results  Labs (all labs ordered are listed, but only abnormal results are displayed) Labs Reviewed  COMPREHENSIVE METABOLIC PANEL - Abnormal; Notable for the following components:      Result Value   Sodium 151 (*)    Chloride 123 (*)    CO2 21 (*)    Glucose, Bld 138 (*)    BUN 66 (*)    Creatinine, Ser 1.50 (*)    Total Protein 8.5 (*)    Albumin 2.6 (*)    AST 442 (*)    ALT 348 (*)    Alkaline Phosphatase 270 (*)    GFR calc non Af Amer 42 (*)    GFR calc Af Amer 48 (*)    All other components within normal limits  CBC - Abnormal; Notable for the following  components:   RBC 3.94 (*)    Hemoglobin 12.4 (*)    HCT 38.9 (*)    RDW 15.7 (*)    All other components within normal limits  URINALYSIS, ROUTINE W REFLEX MICROSCOPIC - Abnormal; Notable for the following components:   APPearance CLOUDY (*)    Hgb urine dipstick MODERATE (*)    Protein,  ur 30 (*)    Leukocytes, UA LARGE (*)    Bacteria, UA MANY (*)    Non Squamous Epithelial 0-5 (*)    All other components within normal limits  TROPONIN I - Abnormal; Notable for the following components:   Troponin I 0.03 (*)    All other components within normal limits  PROTIME-INR - Abnormal; Notable for the following components:   Prothrombin Time 15.5 (*)    All other components within normal limits  URINE CULTURE  LIPASE, BLOOD  AMMONIA  APTT  HEPATITIS PANEL, ACUTE  GC/CHLAMYDIA PROBE AMP ([place]) NOT AT [facility]    12/03/17 @0300  12/04/18 @0347   ALT 284 341  AST 374 463  Alk Phos 277 288  BUN 70.0 78.2  Creatinine 1.43 1.45    EKG  EKG Interpretation  Date/Time:  Wednesday December 04 2017 20:47:46 EST Ventricular Rate:  93 PR Interval:    QRS Duration: 145 QT Interval:  371 QTC Calculation: 462 R Axis:   88 Text Interpretation:  Atrial fibrillation Ventricular premature complex Right bundle branch block Inferior infarct, old New t wave inversions in lead V2, V3.  No STEMI Confirmed by Antony Blackbird [phone]) on 12/04/2017 11:02:07 PM       Radiology Ct Head Wo Contrast  Result Date: 12/05/2017 CLINICAL DATA:  Altered level of consciousness. EXAM: CT HEAD WITHOUT CONTRAST TECHNIQUE: Contiguous axial images were obtained from the base of the skull through the vertex without intravenous contrast. COMPARISON:  Head CT 07/04/2017 FINDINGS: Brain: Sequela of remote left MCA distribution infarct with large area of encephalomalacia. Mild ex vacuo dilatation of the left lateral ventricle. Chronic 6 mm left frontal subdural fluid collection, unchanged in thickness, slight  interval increase in density from prior exam likely related to recent IV contrast administration. No CT findings of acute ischemia. Advanced cerebral atrophy. No midline shift. Vascular: Increased density of intracranial vasculature likely secondary to recent IV contrast administration for abdominal CT. Skull: Prior left frontal craniotomy.  No fracture or focal lesion. Sinuses/Orbits: Minimal chronic mucosal thickening of right maxillary sinus. No sinus fluid levels. Bilateral cataract resection. Other: None. IMPRESSION: 1. Large remote left MCA infarct with encephalomalacia. Advanced cerebral atrophy. 2. Stable chronic left frontal subdural collection measuring 6 mm. 3. Increased density of intracranial vasculature likely secondary to IV contrast administration for abdominal CT 1-1/2 hours prior. 4. No evidence of acute abnormality. Electronically Signed   By: Jeb Levering M.D.   On: 12/05/2017 00:14   Ct Abdomen Pelvis W Contrast  Result Date: 12/04/2017 CLINICAL DATA:  Abdominal pain. History of gastrointestinal bleeding. History of prostate carcinoma EXAM: CT ABDOMEN AND PELVIS WITH CONTRAST TECHNIQUE: Multidetector CT imaging of the abdomen and pelvis was performed using the standard protocol following bolus administration of intravenous contrast. CONTRAST:  151m ISOVUE-300 IOPAMIDOL (ISOVUE-300) INJECTION 61% COMPARISON:  June 27, 2017 FINDINGS: Lower chest: There is atelectatic change in the lung bases with small pleural effusions bilaterally. There are foci of coronary artery calcification. Hepatobiliary: No intrahepatic lesions are evident. There is a fluid collection along the subcapsular aspect of the liver posteriorly toward the right measuring 6.2 x 2.3 x 1.8 cm, an age uncertain subcapsular liver hematoma. No other perihepatic fluid evident. Gallbladder appears borderline distended without gallbladder wall thickening. There is no appreciable biliary duct dilatation. Pancreas: There is no  pancreatic mass or inflammatory focus. Spleen: No splenic lesions are evident. Adrenals/Urinary Tract: Adrenals bilaterally appear normal. There are parapelvic cysts in each kidney. There is  a cyst in the periphery of the right kidney measuring 1.3 x 1.1 cm. There is no appreciable hydronephrosis on either side. There is no renal or ureteral calculus on either side. Urinary bladder wall is mildly thickened. Stomach/Bowel: The rectum is distended with stool. There is no perirectal stranding or rectal wall thickening. There is also fairly extensive stool throughout the mid distal sigmoid colon without inflammatory changes. There is no bowel obstruction. No free air or portal venous air. A gastrostomy catheter tip is positioned within the stomach. There is no abnormal fluid along the course of this catheter. Vascular/Lymphatic: There is atherosclerotic calcification throughout the aorta and iliac arteries. There is calcification in the major mesenteric arterial vessels. There is no major mesenteric arterial obstruction. No adenopathy is appreciable abdomen or pelvis. Reproductive: Prostate is absent.  There is no pelvic mass evident. Other: Appendix appears unremarkable. No abscess or ascites evident in the abdomen or pelvis. There is postoperative change along the anterior pelvic wall. There is fat in both inguinal rings. There is a calcified structure within the left inguinal ring, a likely small calcified hematoma. No bowel extends into this area. Musculoskeletal: There is degenerative change throughout the lower thoracic and lumbar spine. No blastic or lytic bone lesions are identified. There is no intramuscular lesion. The proximal thigh muscles are somewhat atrophic on both sides. No abdominal wall lesion evident. Patient is status post median sternotomy. IMPRESSION: 1. Rectum is rather markedly distended with stool. No perirectal wall thickening or perirectal stranding evident. 2. Mild thickening in the urinary  bladder wall. Suspect cystitis. No renal or ureteral calculi. No appreciable hydronephrosis. There are parapelvic cysts bilaterally. 3.  Prostate absent. 4. Age uncertain small subcapsular hematoma along the posterior rightward aspect of the liver. 5. Gallbladder mildly distended without gallbladder wall thickening. 6. Aortoiliac atherosclerosis. Calcification in major mesenteric arterial vessels. Multiple foci of coronary artery calcification noted. 7.  Gastrostomy catheter tip positioned within the stomach. 8. Left inguinal hernia containing a small calcified hematoma. No bowel extends into this hernia. Aortic Atherosclerosis (ICD10-I70.0). Electronically Signed   By: Lowella Grip III M.D.   On: 12/04/2017 22:46   Dg Chest Portable 1 View  Result Date: 12/04/2017 CLINICAL DATA:  Abdominal pain. Poor intake. History of stroke and GI bleed. EXAM: PORTABLE CHEST 1 VIEW COMPARISON:  Chest radiograph July 15, 2017 FINDINGS: Cardiac silhouette is mildly enlarged. Status post median sternotomy for CABG. Mildly calcified aortic knob. Improved aeration LEFT lung base without pleural effusion for focal consolidation. No pneumothorax. Osteopenia. Surgical clips project in LEFT abdomen. IMPRESSION: Mild cardiomegaly, status post CABG.  No acute pulmonary process. Electronically Signed   By: Elon Alas M.D.   On: 12/04/2017 21:43    Procedures Fecal disimpaction Date/Time: 12/05/2017 12:20 AM Performed by: Lorayne Bender, PA-C Authorized by: Lorayne Bender, PA-C  Consent: Verbal consent obtained. Risks and benefits: risks, benefits and alternatives were discussed Consent given by: guardian Patient identity confirmed: arm band and provided demographic data Local anesthesia used: yes  Anesthesia: Local anesthesia used: yes Local Anesthetic: topical anesthetic (Topical 2% lidocaine gel)  Sedation: Patient sedated: no  Patient tolerance: Patient tolerated the procedure well with no immediate  complications Comments: Disimpaction using digital technique.  Large amount of soft, thick stool palpated in the proximal rectal vault.  Was able to remove some of the stool, which allowed a minimal amount of flow no further stool, however, a large amount of impaction still exists.    (including critical  care time)  Medications Ordered in ED Medications  iopamidol (ISOVUE-300) 61 % injection (not administered)  lidocaine (XYLOCAINE) 2 % jelly 1 application (not administered)  iopamidol (ISOVUE-300) 61 % injection (100 mLs  Contrast Given 12/04/17 2221)  cefTRIAXone (ROCEPHIN) 1 g in dextrose 5 % 50 mL IVPB (1 g Intravenous New Bag/Given 12/04/17 2326)  sodium chloride 0.9 % bolus 1,000 mL (1,000 mLs Intravenous New Bag/Given 12/04/17 2329)     Initial Impression / Assessment and Plan / ED Course  I have reviewed the triage vital signs and the nursing notes.  Pertinent labs & imaging results that were available during my care of the patient were reviewed by me and considered in my medical decision making (see chart for details).  Clinical Course as of Dec 05 44  Wed Dec 04, 2017  2304 Noted to also be 12.4 on outpatient lab results drawn yesterday. Hemoglobin: (!) 12.4 [SJ]  Thu Dec 05, 2017  0037 Spoke with Dr. Myna Hidalgo, hospitalist.  Agrees to admit the patient.  [SJ]    Clinical Course User Index [SJ] Carisma Troupe C, PA-C    Patient presents with altered mental status, acute delirium in the setting of purulent UTI and transaminitis without evidence of obstruction.   Admission for further treatment.   Fecal impaction noted in rectal vault on CT without surrounding edema to the colon.  Attempted disimpaction with both digital technique and enema.  Findings and plan of care discussed with Antony Blackbird, MD. Dr. Sherry Ruffing personally evaluated and examined this patient.  Vitals:   12/04/17 2154 12/04/17 2157 12/04/17 2200 12/04/17 2323  BP:  95/83 112/65 113/74  Pulse:  91 96 90    Resp:  15 19 20   Temp: 100.1 F (37.8 C)     TempSrc: Rectal     SpO2:  98% 98% 99%     Final Clinical Impressions(s) / ED Diagnoses   Final diagnoses:  Cystitis  Acute delirium  Transaminitis    ED Discharge Orders        Ordered    COMPLETE METABOLIC PANEL WITH GFR    Comments:  This order was created through External Result Entry    12/04/17 2218       Layla Maw 12/05/17 [id number]    Tegeler, Gwenyth Allegra, MD 12/05/17 [phone]

## 2017-12-05 ENCOUNTER — Encounter (HOSPITAL_COMMUNITY): Payer: Self-pay | Admitting: Family Medicine

## 2017-12-05 DIAGNOSIS — G9341 Metabolic encephalopathy: Secondary | ICD-10-CM | POA: Diagnosis present

## 2017-12-05 DIAGNOSIS — I10 Essential (primary) hypertension: Secondary | ICD-10-CM

## 2017-12-05 DIAGNOSIS — R4701 Aphasia: Secondary | ICD-10-CM | POA: Diagnosis present

## 2017-12-05 DIAGNOSIS — E871 Hypo-osmolality and hyponatremia: Secondary | ICD-10-CM | POA: Diagnosis present

## 2017-12-05 DIAGNOSIS — R443 Hallucinations, unspecified: Secondary | ICD-10-CM | POA: Diagnosis present

## 2017-12-05 DIAGNOSIS — B965 Pseudomonas (aeruginosa) (mallei) (pseudomallei) as the cause of diseases classified elsewhere: Secondary | ICD-10-CM | POA: Diagnosis present

## 2017-12-05 DIAGNOSIS — I2510 Atherosclerotic heart disease of native coronary artery without angina pectoris: Secondary | ICD-10-CM | POA: Diagnosis present

## 2017-12-05 DIAGNOSIS — E119 Type 2 diabetes mellitus without complications: Secondary | ICD-10-CM | POA: Diagnosis present

## 2017-12-05 DIAGNOSIS — I2583 Coronary atherosclerosis due to lipid rich plaque: Secondary | ICD-10-CM | POA: Diagnosis present

## 2017-12-05 DIAGNOSIS — N3001 Acute cystitis with hematuria: Secondary | ICD-10-CM | POA: Diagnosis not present

## 2017-12-05 DIAGNOSIS — I69351 Hemiplegia and hemiparesis following cerebral infarction affecting right dominant side: Secondary | ICD-10-CM | POA: Diagnosis not present

## 2017-12-05 DIAGNOSIS — Z8679 Personal history of other diseases of the circulatory system: Secondary | ICD-10-CM

## 2017-12-05 DIAGNOSIS — E1140 Type 2 diabetes mellitus with diabetic neuropathy, unspecified: Secondary | ICD-10-CM

## 2017-12-05 DIAGNOSIS — N3090 Cystitis, unspecified without hematuria: Secondary | ICD-10-CM | POA: Diagnosis present

## 2017-12-05 DIAGNOSIS — K5900 Constipation, unspecified: Secondary | ICD-10-CM | POA: Diagnosis present

## 2017-12-05 DIAGNOSIS — E43 Unspecified severe protein-calorie malnutrition: Secondary | ICD-10-CM | POA: Diagnosis present

## 2017-12-05 DIAGNOSIS — K7689 Other specified diseases of liver: Secondary | ICD-10-CM | POA: Diagnosis present

## 2017-12-05 DIAGNOSIS — G9340 Encephalopathy, unspecified: Secondary | ICD-10-CM | POA: Diagnosis present

## 2017-12-05 DIAGNOSIS — D539 Nutritional anemia, unspecified: Secondary | ICD-10-CM | POA: Diagnosis present

## 2017-12-05 DIAGNOSIS — I482 Chronic atrial fibrillation: Secondary | ICD-10-CM

## 2017-12-05 DIAGNOSIS — I69319 Unspecified symptoms and signs involving cognitive functions following cerebral infarction: Secondary | ICD-10-CM | POA: Diagnosis not present

## 2017-12-05 DIAGNOSIS — N390 Urinary tract infection, site not specified: Secondary | ICD-10-CM | POA: Diagnosis not present

## 2017-12-05 DIAGNOSIS — L89152 Pressure ulcer of sacral region, stage 2: Secondary | ICD-10-CM | POA: Diagnosis present

## 2017-12-05 DIAGNOSIS — R740 Nonspecific elevation of levels of transaminase and lactic acid dehydrogenase [LDH]: Secondary | ICD-10-CM

## 2017-12-05 DIAGNOSIS — E870 Hyperosmolality and hypernatremia: Secondary | ICD-10-CM | POA: Diagnosis not present

## 2017-12-05 DIAGNOSIS — K9423 Gastrostomy malfunction: Secondary | ICD-10-CM | POA: Diagnosis not present

## 2017-12-05 DIAGNOSIS — N179 Acute kidney failure, unspecified: Secondary | ICD-10-CM | POA: Diagnosis present

## 2017-12-05 DIAGNOSIS — R748 Abnormal levels of other serum enzymes: Secondary | ICD-10-CM | POA: Diagnosis not present

## 2017-12-05 DIAGNOSIS — I69328 Other speech and language deficits following cerebral infarction: Secondary | ICD-10-CM | POA: Diagnosis not present

## 2017-12-05 DIAGNOSIS — I959 Hypotension, unspecified: Secondary | ICD-10-CM | POA: Diagnosis not present

## 2017-12-05 DIAGNOSIS — I63412 Cerebral infarction due to embolism of left middle cerebral artery: Secondary | ICD-10-CM | POA: Diagnosis not present

## 2017-12-05 DIAGNOSIS — R7401 Elevation of levels of liver transaminase levels: Secondary | ICD-10-CM

## 2017-12-05 DIAGNOSIS — E860 Dehydration: Secondary | ICD-10-CM | POA: Diagnosis present

## 2017-12-05 LAB — COMPREHENSIVE METABOLIC PANEL
ALT: 269 U/L — ABNORMAL HIGH (ref 17–63)
ANION GAP: 9 (ref 5–15)
AST: 251 U/L — ABNORMAL HIGH (ref 15–41)
Albumin: 2.4 g/dL — ABNORMAL LOW (ref 3.5–5.0)
Alkaline Phosphatase: 229 U/L — ABNORMAL HIGH (ref 38–126)
BUN: 57 mg/dL — ABNORMAL HIGH (ref 6–20)
CHLORIDE: 129 mmol/L — AB (ref 101–111)
CO2: 17 mmol/L — AB (ref 22–32)
Calcium: 8.7 mg/dL — ABNORMAL LOW (ref 8.9–10.3)
Creatinine, Ser: 1.47 mg/dL — ABNORMAL HIGH (ref 0.61–1.24)
GFR calc non Af Amer: 43 mL/min — ABNORMAL LOW (ref >60)
GFR, EST AFRICAN AMERICAN: 49 mL/min — AB (ref >60)
Glucose, Bld: 153 mg/dL — ABNORMAL HIGH (ref 65–99)
POTASSIUM: 3.4 mmol/L — AB (ref 3.5–5.1)
SODIUM: 155 mmol/L — AB (ref 135–145)
Total Bilirubin: 0.8 mg/dL (ref 0.3–1.2)
Total Protein: 7.1 g/dL (ref 6.5–8.1)

## 2017-12-05 LAB — BASIC METABOLIC PANEL
ANION GAP: 9 (ref 5–15)
BUN: 54 mg/dL — ABNORMAL HIGH (ref 6–20)
BUN: 54 mg/dL — AB (ref 6–20)
CALCIUM: 8.9 mg/dL (ref 8.9–10.3)
CALCIUM: 8.7 mg/dL — AB (ref 8.9–10.3)
CO2: 20 mmol/L — ABNORMAL LOW (ref 22–32)
CO2: 17 mmol/L — AB (ref 22–32)
CREATININE: 1.49 mg/dL — AB (ref 0.61–1.24)
CREATININE: 1.49 mg/dL — AB (ref 0.61–1.24)
Chloride: 128 mmol/L — ABNORMAL HIGH (ref 101–111)
Chloride: >130 mmol/L (ref 101–111)
GFR calc Af Amer: 49 mL/min — ABNORMAL LOW (ref >60)
GFR calc non Af Amer: 42 mL/min — ABNORMAL LOW (ref >60)
GFR, EST AFRICAN AMERICAN: 49 mL/min — AB (ref >60)
GFR, EST NON AFRICAN AMERICAN: 42 mL/min — AB (ref >60)
Glucose, Bld: 144 mg/dL — ABNORMAL HIGH (ref 65–99)
Glucose, Bld: 131 mg/dL — ABNORMAL HIGH (ref 65–99)
Potassium: 3.5 mmol/L (ref 3.5–5.1)
Potassium: 4.5 mmol/L (ref 3.5–5.1)
SODIUM: 156 mmol/L — AB (ref 135–145)
Sodium: 157 mmol/L — ABNORMAL HIGH (ref 135–145)

## 2017-12-05 LAB — MRSA PCR SCREENING: MRSA BY PCR: POSITIVE — AB

## 2017-12-05 LAB — GLUCOSE, CAPILLARY
GLUCOSE-CAPILLARY: 84 mg/dL (ref 65–99)
Glucose-Capillary: 154 mg/dL — ABNORMAL HIGH (ref 65–99)
Glucose-Capillary: 112 mg/dL — ABNORMAL HIGH (ref 65–99)
Glucose-Capillary: 199 mg/dL — ABNORMAL HIGH (ref 65–99)
Glucose-Capillary: 136 mg/dL — ABNORMAL HIGH (ref 65–99)
Glucose-Capillary: 148 mg/dL — ABNORMAL HIGH (ref 65–99)

## 2017-12-05 LAB — TROPONIN I: Troponin I: <0.03 ng/mL (ref <0.03)

## 2017-12-05 MED ORDER — DEXTROSE 5 % IV SOLN
INTRAVENOUS | Status: DC
  Administered 2017-12-05 – 2017-12-09 (×7): via INTRAVENOUS

## 2017-12-05 MED ORDER — PRAVASTATIN SODIUM 40 MG PO TABS
40.0000 mg | ORAL_TABLET | Freq: Every day | ORAL | Status: DC
  Administered 2017-12-05: 40 mg
  Filled 2017-12-05: qty 1

## 2017-12-05 MED ORDER — FREE WATER
400.0000 mL | Status: DC
  Administered 2017-12-05 – 2017-12-11 (×34): 400 mL

## 2017-12-05 MED ORDER — ONDANSETRON HCL 4 MG PO TABS: 4.0000 mg | ORAL_TABLET | Freq: Four times a day (QID) | ORAL | Status: DC | PRN

## 2017-12-05 MED ORDER — DEXTROSE 5 % IV SOLN
1.0000 g | INTRAVENOUS | Status: DC
  Administered 2017-12-05 – 2017-12-06 (×2): 1 g via INTRAVENOUS
  Filled 2017-12-05 (×2): qty 10

## 2017-12-05 MED ORDER — FAMOTIDINE 20 MG PO TABS
20.0000 mg | ORAL_TABLET | Freq: Every day | ORAL | Status: DC
  Administered 2017-12-05 – 2017-12-12 (×8): 20 mg
  Filled 2017-12-05 (×8): qty 1

## 2017-12-05 MED ORDER — PRO-STAT SUGAR FREE PO LIQD
30.0000 mL | Freq: Two times a day (BID) | ORAL | Status: DC
  Administered 2017-12-05 – 2017-12-12 (×15): 30 mL
  Filled 2017-12-05 (×16): qty 30

## 2017-12-05 MED ORDER — FLEET ENEMA 7-19 GM/118ML RE ENEM: 1.0000 | ENEMA | Freq: Once | RECTAL | Status: DC | PRN

## 2017-12-05 MED ORDER — HYDROCODONE-ACETAMINOPHEN 5-325 MG PO TABS: 1.0000 | ORAL_TABLET | ORAL | Status: DC | PRN

## 2017-12-05 MED ORDER — SODIUM CHLORIDE 0.9 % IV BOLUS (SEPSIS)
1000.0000 mL | Freq: Once | INTRAVENOUS | Status: AC
  Administered 2017-12-05: 1000 mL via INTRAVENOUS

## 2017-12-05 MED ORDER — LORATADINE 10 MG PO TABS
10.0000 mg | ORAL_TABLET | Freq: Every day | ORAL | Status: DC
  Administered 2017-12-05 – 2017-12-12 (×8): 10 mg
  Filled 2017-12-05 (×8): qty 1

## 2017-12-05 MED ORDER — METOPROLOL TARTRATE 50 MG PO TABS
100.0000 mg | ORAL_TABLET | Freq: Two times a day (BID) | ORAL | Status: DC
  Administered 2017-12-05 – 2017-12-07 (×4): 100 mg
  Filled 2017-12-05 (×7): qty 2

## 2017-12-05 MED ORDER — HEPARIN SODIUM (PORCINE) 5000 UNIT/ML IJ SOLN: 5000.0000 [IU] | Freq: Three times a day (TID) | INTRAMUSCULAR | Status: DC

## 2017-12-05 MED ORDER — PROPRANOLOL HCL 40 MG PO TABS
40.0000 mg | ORAL_TABLET | Freq: Two times a day (BID) | ORAL | Status: DC
  Administered 2017-12-05 – 2017-12-07 (×6): 40 mg
  Filled 2017-12-05 (×7): qty 1

## 2017-12-05 MED ORDER — MINERAL OIL RE ENEM
1.0000 | ENEMA | Freq: Once | RECTAL | Status: AC
  Administered 2017-12-08: 1 via RECTAL
  Filled 2017-12-05: qty 1

## 2017-12-05 MED ORDER — INSULIN ASPART 100 UNIT/ML ~~LOC~~ SOLN
0.0000 [IU] | SUBCUTANEOUS | Status: DC
  Administered 2017-12-05: 2 [IU] via SUBCUTANEOUS
  Administered 2017-12-05 (×2): 1 [IU] via SUBCUTANEOUS
  Administered 2017-12-05 – 2017-12-06 (×3): 2 [IU] via SUBCUTANEOUS
  Administered 2017-12-06: 1 [IU] via SUBCUTANEOUS
  Administered 2017-12-06 – 2017-12-07 (×2): 2 [IU] via SUBCUTANEOUS
  Administered 2017-12-07: 1 [IU] via SUBCUTANEOUS
  Administered 2017-12-07 (×3): 2 [IU] via SUBCUTANEOUS
  Administered 2017-12-08: 1 [IU] via SUBCUTANEOUS
  Administered 2017-12-08 (×2): 2 [IU] via SUBCUTANEOUS
  Administered 2017-12-08: 1 [IU] via SUBCUTANEOUS
  Administered 2017-12-08: 3 [IU] via SUBCUTANEOUS
  Administered 2017-12-09 (×2): 2 [IU] via SUBCUTANEOUS
  Administered 2017-12-09: 1 [IU] via SUBCUTANEOUS
  Administered 2017-12-09 – 2017-12-10 (×3): 2 [IU] via SUBCUTANEOUS
  Administered 2017-12-10: 1 [IU] via SUBCUTANEOUS
  Administered 2017-12-10 (×2): 2 [IU] via SUBCUTANEOUS
  Administered 2017-12-10: 3 [IU] via SUBCUTANEOUS
  Administered 2017-12-11 (×2): 2 [IU] via SUBCUTANEOUS
  Administered 2017-12-11 (×2): 1 [IU] via SUBCUTANEOUS
  Administered 2017-12-12 (×2): 2 [IU] via SUBCUTANEOUS

## 2017-12-05 MED ORDER — AMLODIPINE BESYLATE 5 MG PO TABS
5.0000 mg | ORAL_TABLET | Freq: Every day | ORAL | Status: DC
  Administered 2017-12-05 – 2017-12-07 (×3): 5 mg
  Filled 2017-12-05 (×4): qty 1

## 2017-12-05 MED ORDER — ACETAMINOPHEN 650 MG RE SUPP: 650.0000 mg | Freq: Four times a day (QID) | RECTAL | Status: DC | PRN

## 2017-12-05 MED ORDER — ONDANSETRON HCL 4 MG/2ML IJ SOLN: 4.0000 mg | Freq: Four times a day (QID) | INTRAMUSCULAR | Status: DC | PRN

## 2017-12-05 MED ORDER — BISACODYL 10 MG RE SUPP: 10.0000 mg | Freq: Every day | RECTAL | Status: DC | PRN

## 2017-12-05 MED ORDER — GLYCERIN (LAXATIVE) 2.1 G RE SUPP
1.0000 | Freq: Once | RECTAL | Status: AC
  Administered 2017-12-05: 1 via RECTAL
  Filled 2017-12-05: qty 1

## 2017-12-05 MED ORDER — POLYETHYLENE GLYCOL 3350 17 G PO PACK: 17.0000 g | PACK | Freq: Every day | ORAL | Status: DC | PRN

## 2017-12-05 MED ORDER — SENNOSIDES-DOCUSATE SODIUM 8.6-50 MG PO TABS
1.0000 | ORAL_TABLET | Freq: Every day | ORAL | Status: DC
  Administered 2017-12-06 – 2017-12-11 (×6): 1
  Filled 2017-12-05 (×6): qty 1

## 2017-12-05 MED ORDER — POTASSIUM CHLORIDE 20 MEQ/15ML (10%) PO SOLN
20.0000 meq | Freq: Every day | ORAL | Status: DC
  Administered 2017-12-05 – 2017-12-12 (×8): 20 meq
  Filled 2017-12-05 (×8): qty 15

## 2017-12-05 MED ORDER — METOCLOPRAMIDE HCL 5 MG PO TABS
5.0000 mg | ORAL_TABLET | Freq: Three times a day (TID) | ORAL | Status: DC
  Administered 2017-12-05 – 2017-12-12 (×21): 5 mg
  Filled 2017-12-05 (×21): qty 1

## 2017-12-05 MED ORDER — ACETAMINOPHEN 325 MG PO TABS
650.0000 mg | ORAL_TABLET | Freq: Four times a day (QID) | ORAL | Status: DC | PRN
  Administered 2017-12-06 – 2017-12-11 (×3): 650 mg via ORAL
  Filled 2017-12-05 (×3): qty 2

## 2017-12-05 NOTE — H&P (Signed)
History and Physical    SEBASTHIAN STAILEY XVQ:[id number] DOB: 11/27/1933 DOA: 12/04/2017  PCP: Leonel Ramsay, MD   Patient coming from: SNF  Chief Complaint: Increased confusion, poor appetite, fever, constipation   HPI: Robert Keith is a 83 y.o. male with medical history significant for prostate cancer status post resection, coronary artery disease status post remote CABG, hypertension, diabetes mellitus, chronic subdural hematoma, history of large left MCA infarct with considerable residual deficit, history of intracranial hemorrhage, and chronic atrial fibrillation without anticoagulation, now presenting to the emergency department from his SNF for evaluation of apparent confusion, hallucinations, and poor appetite.  Over the past 2-3 days, the patient has been noted to not be eating and speaking very little if at all.  He has seem to be having pain in his abdomen.  He seems to be confused, apparently reaching for objects that are unseen to others.  There was no fall or trauma reported.  There was acute worsening today and EMS was called.  The patient was given 1.5 L normal saline at his facility.  He was found to be febrile on EMS arrival and treated with 1 g of acetaminophen.  ED Course: Upon arrival to the ED, patient is found to chest x-ray is notable for mild cardiomegaly, but no acute cardiopulmonary disease.  Noncontrast head CT is negative for acute intracranial abnormality, but notable for a large remote left MCA infarct and stable left frontal subdural collection.  CT of the abdomen and pelvis demonstrates a rectum distended with stool, mild thickening of the urinary bladder suggestive of cystitis, have a temperature of 37.8 degree C, saturating well on room air, and with vitals otherwise stable.  EKG features atrial fibrillation with PVC and RBBB.  No hydronephrosis, and subcapsular hematoma along the posterior right liver.  Chemistry panel features a sodium of 151, BUN 66, creatinine  1.50, up from 0.79 on prior chemistry panels.  LFTs are elevated with alkaline phosphatase of 270, AST 447, and ALT 348.  LFTs were previously normal.  Ammonia is normal.  CBC features a slight normocytic anemia with hemoglobin of 12.4, up from priors in the 9 range.  Urinalysis was suggestive of infection and sample was sent for culture.  Patient was treated with 1 L of normal saline, disimpacted manually, and given a gram of Rocephin in the ED.  He remained hemodynamically stable and in no apparent respiratory distress, and will be admitted to the medical-surgical unit for ongoing evaluation and management of acute encephalopathy, UTI, dehydration, acute kidney injury, and subcapsular liver hematoma.  Review of Systems:  All other systems reviewed and apart from HPI, are negative.  Past Medical History:  Diagnosis Date  . Anemia   . Cancer [facility])    prostate  . Chronic atrial fibrillation ([place])   . Coronary artery disease    a. s/p CABG [facility] 1996.  . Diabetes mellitus without complication ([place])   . Essential tremor   . GI bleed 09/2016  . H. pylori infection   . H/O craniotomy   . Hypertension   . Intracranial hemorrhage ([place])   . Prostate cancer ([place])   . RBBB   . SDH (subdural hematoma) ([place])   . Stroke [facility])     Past Surgical History:  Procedure Laterality Date  . brain surgery for bleed    . cardiac bypass    . CORONARY ARTERY BYPASS GRAFT    . ESOPHAGOGASTRODUODENOSCOPY N/A 07/12/2017   Procedure: ESOPHAGOGASTRODUODENOSCOPY (EGD);  Surgeon: Hulen Skains,  Jeneen Rinks, MD;  Location: [facility];  Service: General;  Laterality: N/A;  . ESOPHAGOGASTRODUODENOSCOPY (EGD) WITH PROPOFOL N/A 09/18/2016   Procedure: ESOPHAGOGASTRODUODENOSCOPY (EGD) WITH PROPOFOL;  Surgeon: [name], MD;  [facility];  Service: Endoscopy;  Laterality: N/A;  . FEMORAL ARTERY EXPLORATION Right 06/27/2017   Procedure: FEMORAL ARTERY EXPLORATION;  Surgeon: Conrad Foster Brook, MD;  Location: [facility];   Service: Vascular;  Laterality: Right;  . IR PERCUTANEOUS ART THROMBECTOMY/INFUSION INTRACRANIAL INC DIAG ANGIO  06/26/2017  . PEG PLACEMENT N/A 07/12/2017   Procedure: PERCUTANEOUS ENDOSCOPIC GASTROSTOMY (PEG) PLACEMENT;  Surgeon: [name], MD;  Location: [facility];  Service: General;  Laterality: N/A;  . RADIOLOGY WITH ANESTHESIA N/A 06/26/2017   Procedure: RADIOLOGY WITH ANESTHESIA CODE STROKE;  Surgeon: [name], MD;  Location: [facility];  Service: Radiology;  Laterality: N/A;     reports that he has quit smoking. he has never used smokeless tobacco. He reports that he does not drink alcohol or use drugs.  Allergies  Allergen Reactions  . Metformin And Related Diarrhea  . Tape Other (See Comments)    PLEASE USE COBAN WRAP; PATIENT'S SKIN IS THIN AND WILL TEAR EASILY!!  . Bupropion Other (See Comments)    Unknown  . Fenofibrate Micronized Other (See Comments)    Unknown  . Gemfibrozil Other (See Comments)    Unknown  . Niacin Other (See Comments)    Impotence   . Phenobarbital Other (See Comments)    Confusion   . Simvastatin Other (See Comments)    Myalgia    Family History  Problem Relation Age of Onset  . Stroke Mother   . Heart attack Father   . Stroke Brother      Prior to Admission medications   Medication Sig Start Date End Date Taking? Authorizing Provider  acetaminophen (TYLENOL) 500 MG tablet Take 500 mg by mouth every 8 (eight) hours as needed.    [provider]  Amino Acids-Protein Hydrolys (FEEDING SUPPLEMENT, PRO-STAT SUGAR FREE 64,) LIQD Place 30 mLs into feeding tube 2 (two) times daily. 07/17/17   Patteson, Arlan Organ, NP  amLODipine (NORVASC) 5 MG tablet Place 1 tablet (5 mg total) into feeding tube daily. 07/18/17   Patteson, Arlan Organ, NP  aspirin 325 MG tablet Place 1 tablet (325 mg total) into feeding tube daily. 07/18/17   Patteson, Arlan Organ, NP  clotrimazole (LOTRIMIN) 1 % cream Apply 1 application topically 2 (two) times daily.   06/20/17   [provider]  famotidine (PEPCID) 40 MG tablet Place 1 tablet (40 mg total) into feeding tube 2 (two) times daily before a meal. 07/17/17 07/17/18  Patteson, Arlan Organ, NP  glipiZIDE (GLUCOTROL) 10 MG tablet Place 1 tablet (10 mg total) into feeding tube 2 (two) times daily with a meal. 07/17/17   Patteson, Samuel A, NP  KLOR-CON M20 20 MEQ tablet TAKE 1 TABLET (20 MEQ TOTAL) BY MOUTH DAILY AS NEEDED. 06/11/17   [provider]  levofloxacin (LEVAQUIN) 500 MG tablet  08/19/17   [provider]  loratadine (CLARITIN) 10 MG tablet Place 1 tablet (10 mg total) into feeding tube daily. 07/17/17   Patteson, Arlan Organ, NP  MEGARED OMEGA-3 KRILL OIL PO 1 capsule by PEG Tube route daily.     [provider]  metoCLOPramide (REGLAN) 5 MG tablet Place into feeding tube.  08/29/17   [provider]  metoprolol tartrate (LOPRESSOR) 100 MG tablet Place 1 tablet (100 mg total) into feeding tube  2 (two) times daily. 07/17/17   Patteson, Arlan Organ, NP  nystatin (MYCOSTATIN) 100000 UNIT/ML suspension Use as directed 5 mLs (500,000 Units total) in the mouth or throat 4 (four) times daily. 07/17/17   Patteson, Arlan Organ, NP  pioglitazone (ACTOS) 15 MG tablet Place 1 tablet (15 mg total) into feeding tube daily with breakfast. 07/18/17   Patteson, Arlan Organ, NP  pravastatin (PRAVACHOL) 40 MG tablet Place 1 tablet (40 mg total) into feeding tube at bedtime. 07/17/17   Patteson, Arlan Organ, NP  propranolol (INDERAL) 40 MG tablet Place 1 tablet (40 mg total) into feeding tube 2 (two) times daily. 07/17/17   Charm Rings, NP  PROTONIX 40 MG PACK  08/23/17   [provider]  senna-docusate (SENOKOT-S) 8.6-50 MG tablet Place 1 tablet into feeding tube at bedtime as needed for mild constipation. 07/17/17   Patteson, Arlan Organ, NP  Water For Irrigation, Sterile (FREE WATER) SOLN Place 300 mLs into feeding tube every 6 (six) hours. 07/17/17   Charm Rings, NP    Physical Exam: Vitals:    12/04/17 2200 12/04/17 2323 12/05/17 0130 12/05/17 0245  BP: 112/65 113/74 113/67 120/66  Pulse: 96 90 97 (!) 102  Resp: 19 20  20   Temp:    98.6 F (37 C)  TempSrc:    Axillary  SpO2: 98% 99% 97% 98%      Constitutional: NAD, calm, chronically-ill in appearance.  Eyes: PERTLA, lids and conjunctivae normal ENMT: Mucous membranes are dry. Posterior pharynx clear of any exudate or lesions.   Neck: normal, supple, no masses, no thyromegaly Respiratory: clear to auscultation bilaterally, no wheezing, no crackles. Normal respiratory effort.  Cardiovascular: S1 & S2 heard, regular rate and rhythm. No significant JVD. Abdomen: No distension, no masses palpated. Bowel sounds active.  Musculoskeletal: no clubbing / cyanosis. No joint deformity upper and lower extremities.  Skin: no significant rashes, lesions, ulcers. Warm, dry, well-perfused. Neurologic: Makes eye-contact. No verbal response.   Psychiatric: Alert. Not able to respond verbally. Appears calm.     Labs on Admission: I have personally reviewed following labs and imaging studies  CBC: Recent Labs  Lab 12/04/17 2106  WBC 9.5  HGB 12.4*  HCT 38.9*  MCV 98.7  PLT 767   Basic Metabolic Panel: Recent Labs  Lab 12/03/17 0300 12/04/17 2106  NA  --  151*  K  --  3.6  CL  --  123*  CO2  --  21*  GLUCOSE  --  138*  BUN 70.0 66*  CREATININE 1.43 1.50*  CALCIUM  --  8.9   GFR: CrCl cannot be calculated (Unknown ideal weight.). Liver Function Tests: Recent Labs  Lab 12/03/17 0300 12/04/17 2106  AST 374 442*  ALT 284 348*  ALKPHOS  --  270*  BILITOT  --  0.7  PROT  --  8.5*  ALBUMIN  --  2.6*   Recent Labs  Lab 12/04/17 2106  LIPASE 22   Recent Labs  Lab 12/04/17 2254  AMMONIA 16   Coagulation Profile: Recent Labs  Lab 12/04/17 2304  INR 1.24   Cardiac Enzymes: Recent Labs  Lab 12/04/17 2055  TROPONINI 0.03*   BNP (last 3 results) No results for input(s): PROBNP in the last 8760  hours. HbA1C: No results for input(s): HGBA1C in the last 72 hours. CBG: Recent Labs  Lab 12/05/17 0327  GLUCAP 154*   Lipid Profile: No results for input(s): CHOL, HDL, LDLCALC, TRIG, CHOLHDL, LDLDIRECT in  the last 72 hours. Thyroid Function Tests: No results for input(s): TSH, T4TOTAL, FREET4, T3FREE, THYROIDAB in the last 72 hours. Anemia Panel: No results for input(s): VITAMINB12, FOLATE, FERRITIN, TIBC, IRON, RETICCTPCT in the last 72 hours. Urine analysis:    Component Value Date/Time   COLORURINE YELLOW 12/04/2017 2148   APPEARANCEUR CLOUDY (A) 12/04/2017 2148   APPEARANCEUR Clear 12/22/2014 1430   LABSPEC 1.009 12/04/2017 2148   LABSPEC 1.017 12/22/2014 1430   PHURINE 6.0 12/04/2017 2148   GLUCOSEU NEGATIVE 12/04/2017 2148   GLUCOSEU Negative 12/22/2014 1430   HGBUR MODERATE (A) 12/04/2017 2148   BILIRUBINUR NEGATIVE 12/04/2017 2148   BILIRUBINUR Negative 12/22/2014 [place] 12/04/2017 2148   PROTEINUR 30 (A) 12/04/2017 2148   NITRITE NEGATIVE 12/04/2017 2148   LEUKOCYTESUR LARGE (A) 12/04/2017 2148   LEUKOCYTESUR Negative 12/22/2014 1430   Sepsis Labs: @LABRCNTIP (procalcitonin:4,lacticidven:4) )No results found for this or any previous visit (from the past 240 hour(s)).   Radiological Exams on Admission: Ct Head Wo Contrast  Result Date: 12/05/2017 CLINICAL DATA:  Altered level of consciousness. EXAM: CT HEAD WITHOUT CONTRAST TECHNIQUE: Contiguous axial images were obtained from the base of the skull through the vertex without intravenous contrast. COMPARISON:  Head CT 07/04/2017 FINDINGS: Brain: Sequela of remote left MCA distribution infarct with large area of encephalomalacia. Mild ex vacuo dilatation of the left lateral ventricle. Chronic 6 mm left frontal subdural fluid collection, unchanged in thickness, slight interval increase in density from prior exam likely related to recent IV contrast administration. No CT findings of acute ischemia.  Advanced cerebral atrophy. No midline shift. Vascular: Increased density of intracranial vasculature likely secondary to recent IV contrast administration for abdominal CT. Skull: Prior left frontal craniotomy.  No fracture or focal lesion. Sinuses/Orbits: Minimal chronic mucosal thickening of right maxillary sinus. No sinus fluid levels. Bilateral cataract resection. Other: None. IMPRESSION: 1. Large remote left MCA infarct with encephalomalacia. Advanced cerebral atrophy. 2. Stable chronic left frontal subdural collection measuring 6 mm. 3. Increased density of intracranial vasculature likely secondary to IV contrast administration for abdominal CT 1-1/2 hours prior. 4. No evidence of acute abnormality. Electronically Signed   By: Jeb Levering M.D.   On: 12/05/2017 00:14   Ct Abdomen Pelvis W Contrast  Result Date: 12/04/2017 CLINICAL DATA:  Abdominal pain. History of gastrointestinal bleeding. History of prostate carcinoma EXAM: CT ABDOMEN AND PELVIS WITH CONTRAST TECHNIQUE: Multidetector CT imaging of the abdomen and pelvis was performed using the standard protocol following bolus administration of intravenous contrast. CONTRAST:  147mL ISOVUE-300 IOPAMIDOL (ISOVUE-300) INJECTION 61% COMPARISON:  June 27, 2017 FINDINGS: Lower chest: There is atelectatic change in the lung bases with small pleural effusions bilaterally. There are foci of coronary artery calcification. Hepatobiliary: No intrahepatic lesions are evident. There is a fluid collection along the subcapsular aspect of the liver posteriorly toward the right measuring 6.2 x 2.3 x 1.8 cm, an age uncertain subcapsular liver hematoma. No other perihepatic fluid evident. Gallbladder appears borderline distended without gallbladder wall thickening. There is no appreciable biliary duct dilatation. Pancreas: There is no pancreatic mass or inflammatory focus. Spleen: No splenic lesions are evident. Adrenals/Urinary Tract: Adrenals bilaterally appear  normal. There are parapelvic cysts in each kidney. There is a cyst in the periphery of the right kidney measuring 1.3 x 1.1 cm. There is no appreciable hydronephrosis on either side. There is no renal or ureteral calculus on either side. Urinary bladder wall is mildly thickened. Stomach/Bowel: The rectum is distended with stool.  There is no perirectal stranding or rectal wall thickening. There is also fairly extensive stool throughout the mid distal sigmoid colon without inflammatory changes. There is no bowel obstruction. No free air or portal venous air. A gastrostomy catheter tip is positioned within the stomach. There is no abnormal fluid along the course of this catheter. Vascular/Lymphatic: There is atherosclerotic calcification throughout the aorta and iliac arteries. There is calcification in the major mesenteric arterial vessels. There is no major mesenteric arterial obstruction. No adenopathy is appreciable abdomen or pelvis. Reproductive: Prostate is absent.  There is no pelvic mass evident. Other: Appendix appears unremarkable. No abscess or ascites evident in the abdomen or pelvis. There is postoperative change along the anterior pelvic wall. There is fat in both inguinal rings. There is a calcified structure within the left inguinal ring, a likely small calcified hematoma. No bowel extends into this area. Musculoskeletal: There is degenerative change throughout the lower thoracic and lumbar spine. No blastic or lytic bone lesions are identified. There is no intramuscular lesion. The proximal thigh muscles are somewhat atrophic on both sides. No abdominal wall lesion evident. Patient is status post median sternotomy. IMPRESSION: 1. Rectum is rather markedly distended with stool. No perirectal wall thickening or perirectal stranding evident. 2. Mild thickening in the urinary bladder wall. Suspect cystitis. No renal or ureteral calculi. No appreciable hydronephrosis. There are parapelvic cysts  bilaterally. 3.  Prostate absent. 4. Age uncertain small subcapsular hematoma along the posterior rightward aspect of the liver. 5. Gallbladder mildly distended without gallbladder wall thickening. 6. Aortoiliac atherosclerosis. Calcification in major mesenteric arterial vessels. Multiple foci of coronary artery calcification noted. 7.  Gastrostomy catheter tip positioned within the stomach. 8. Left inguinal hernia containing a small calcified hematoma. No bowel extends into this hernia. Aortic Atherosclerosis (ICD10-I70.0). Electronically Signed   By: Lowella Grip III M.D.   On: 12/04/2017 22:46   Dg Chest Portable 1 View  Result Date: 12/04/2017 CLINICAL DATA:  Abdominal pain. Poor intake. History of stroke and GI bleed. EXAM: PORTABLE CHEST 1 VIEW COMPARISON:  Chest radiograph July 15, 2017 FINDINGS: Cardiac silhouette is mildly enlarged. Status post median sternotomy for CABG. Mildly calcified aortic knob. Improved aeration LEFT lung base without pleural effusion for focal consolidation. No pneumothorax. Osteopenia. Surgical clips project in LEFT abdomen. IMPRESSION: Mild cardiomegaly, status post CABG.  No acute pulmonary process. Electronically Signed   By: Elon Alas M.D.   On: 12/04/2017 21:43    EKG: Independently reviewed. Atrial fibrillation, PVC, RBBB.   Assessment/Plan  1. Acute encephalopathy  - Pt presents from SNF with apparent confusion and hallucinations, not eating or speaking  - He was febrile on EMS arrival and treated with 1 g APAP PTA - No acute findings on CT head; LFT's elevated and discussed below, but ammonia wnl  - Likely secondary to UTI, cultured and started on abx   - Constipation and dehydration could also be contributing; he was disimpacted and fluid-resuscitated in ED   2. UTI  - Pt presents with increased confusion and apparent hallucinations  - Was febrile when EMS picked him up and was treated with 1 g APAP prior to arrival  - No leukocytosis  present but urine was purulent and sent for culture   - He was treated with empiric Rocephin in ED  - Plan to continue Rocephin pending culture data and clinical course    3. AKI; hypernatremia  - SCr is 1.50 on admission, up from priors in 0.8 range  -  Serum sodium is elevated to 151 in setting of dehydration  - He was given 1.5 L NS at his facility prior to transfer and another 2 liters NS on arrival here  - Increase free-water flushes volume and frequency, renally-dose medications, avoid nephrotoxins, repeat chem panel in am   4. Subcapsular liver hematoma; elevated LFT's  - Pt not verbally responsive, but seemed to be in pain with palpation of abdomen  - LFT's noted to be elevated, previously normal  - CT abd/pelvis reveals subcapsular hematoma about the posterior right liver  - He has remained hemodynamically stable and Hgb is higher than priors  - No trauma hx  - Manage conservatively for now, hold ASA, repeat CBC and CMP in am   5. Chronic atrial fibrillation  - In rate-controlled a fib on admission - CHADS-VASc is 76 (age x2, CVA x2, HTN, DM) - No AC in light of hx of hemorrhagic CVA, subdural hematoma, and subcapsular liver hematoma  - Continue metoprolol as tolerated    6. Hx of CVA; chronic subdural hematoma - Pt has hx of large left MCA infarct with motor, cognitive, and speech deficits  - Also has a stable left frontal subdural collection  - No acute findings on CT head  - Hold ASA for now as above, continue statin  7. Type II DM  - A1c was 6.8% in July 2018  - Managed with glipizide at the SNF, held on admission  - Check CBG's and use SSI as needed  8. Hypertension  - BP at goal  - Continue Lopressor and Norvasc     DVT prophylaxis: SCD's  Code Status: DNR Family Communication: Discussed with patient Disposition Plan: Admit to med-surg Consults called: None Admission status: Inpatient    Vianne Bulls, MD [facility] Pager [phone]  If  7PM-7AM, please contact night-coverage [email or web address] Password [facility]  12/05/2017, 3:48 AM

## 2017-12-05 NOTE — Progress Notes (Signed)
Pt admitted from ED, alert with incomprehensible speech, accompanied by family, pt cleaned up and settled in bed with call light within pt's reach, admit doc paged to clarify pt's code status as he came up with a yellow DNR form from the facility. Will continue to monitor. [place]-Asidi, Joia Doyle Efe

## 2017-12-05 NOTE — Progress Notes (Signed)
[facility] hospitalist update note  Reviewed H&P by Dr. Myna Hidalgo.  Agree with it as written.  Imaging did show very significant constipation of the rectum.  Enema ordered.  Patient's very mild elevation of troponin likely medication of acute illness from infection, mild dehydration and acute kidney injury. Reviewed EKG, no stemi.  Hypernatremia Starting D5 drip Serial neuroexams Serial NS checks  Elwin Mocha MD

## 2017-12-05 NOTE — Progress Notes (Signed)
CRITICAL VALUE ALERT  Critical Value:  Chloride greater than 130   Date & Time Notied:  12/05/17 at 21:25   Provider Notified: [facility]  Orders Received/Actions taken: no orders received.

## 2017-12-05 NOTE — ED Notes (Signed)
Applied small condom catheter to pt, due to incontinence. Applied leg strap and bed bag also.

## 2017-12-05 NOTE — Progress Notes (Signed)
IV in L AC occluded. R hand occluded.  I looked SWAT Looked. Unsuccessful. IV team consult placed.

## 2017-12-06 LAB — BASIC METABOLIC PANEL
ANION GAP: 6 (ref 5–15)
BUN: 51 mg/dL — AB (ref 6–20)
BUN: 53 mg/dL — ABNORMAL HIGH (ref 6–20)
BUN: 54 mg/dL — ABNORMAL HIGH (ref 6–20)
CALCIUM: 8.7 mg/dL — AB (ref 8.9–10.3)
CO2: 19 mmol/L — AB (ref 22–32)
CO2: 20 mmol/L — AB (ref 22–32)
CO2: 19 mmol/L — ABNORMAL LOW (ref 22–32)
Calcium: 8.8 mg/dL — ABNORMAL LOW (ref 8.9–10.3)
Calcium: 8.7 mg/dL — ABNORMAL LOW (ref 8.9–10.3)
Chloride: 127 mmol/L — ABNORMAL HIGH (ref 101–111)
Chloride: >130 mmol/L (ref 101–111)
Creatinine, Ser: 1.56 mg/dL — ABNORMAL HIGH (ref 0.61–1.24)
Creatinine, Ser: 1.66 mg/dL — ABNORMAL HIGH (ref 0.61–1.24)
Creatinine, Ser: 1.73 mg/dL — ABNORMAL HIGH (ref 0.61–1.24)
GFR calc non Af Amer: 35 mL/min — ABNORMAL LOW (ref >60)
GFR calc non Af Amer: 37 mL/min — ABNORMAL LOW (ref >60)
GFR, EST AFRICAN AMERICAN: 46 mL/min — AB (ref >60)
GFR, EST AFRICAN AMERICAN: 41 mL/min — AB (ref >60)
GFR, EST AFRICAN AMERICAN: 43 mL/min — AB (ref >60)
GFR, EST NON AFRICAN AMERICAN: 40 mL/min — AB (ref >60)
GLUCOSE: 165 mg/dL — AB (ref 65–99)
Glucose, Bld: 173 mg/dL — ABNORMAL HIGH (ref 65–99)
Glucose, Bld: 130 mg/dL — ABNORMAL HIGH (ref 65–99)
POTASSIUM: 3.7 mmol/L (ref 3.5–5.1)
POTASSIUM: 3.5 mmol/L (ref 3.5–5.1)
POTASSIUM: 4 mmol/L (ref 3.5–5.1)
SODIUM: 152 mmol/L — AB (ref 135–145)
SODIUM: 154 mmol/L — AB (ref 135–145)
SODIUM: 156 mmol/L — AB (ref 135–145)

## 2017-12-06 LAB — HEPATITIS PANEL, ACUTE
HCV Ab: 0.2 s/co ratio (ref 0.0–0.9)
HEP B S AG: NEGATIVE
Hep A IgM: NEGATIVE
Hep B C IgM: NEGATIVE

## 2017-12-06 LAB — HEPATIC FUNCTION PANEL
ALK PHOS: 253 U/L — AB (ref 38–126)
ALT: 294 U/L — ABNORMAL HIGH (ref 17–63)
AST: 306 U/L — ABNORMAL HIGH (ref 15–41)
Albumin: 2.5 g/dL — ABNORMAL LOW (ref 3.5–5.0)
BILIRUBIN INDIRECT: 0.4 mg/dL (ref 0.3–0.9)
Bilirubin, Direct: 0.3 mg/dL (ref 0.1–0.5)
TOTAL PROTEIN: 7.6 g/dL (ref 6.5–8.1)
Total Bilirubin: 0.7 mg/dL (ref 0.3–1.2)

## 2017-12-06 LAB — CBC WITH DIFFERENTIAL/PLATELET
BASOS ABS: 0 10*3/uL (ref 0.0–0.1)
BASOS PCT: 0 %
EOS ABS: 0.4 10*3/uL (ref 0.0–0.7)
Eosinophils Relative: 4 %
HEMATOCRIT: 38.6 % — AB (ref 39.0–52.0)
HEMOGLOBIN: 11.8 g/dL — AB (ref 13.0–17.0)
Lymphocytes Relative: 21 %
Lymphs Abs: 1.9 10*3/uL (ref 0.7–4.0)
MCH: 30.9 pg (ref 26.0–34.0)
MCHC: 30.6 g/dL (ref 30.0–36.0)
MCV: 101 fL — ABNORMAL HIGH (ref 78.0–100.0)
Monocytes Absolute: 0.8 10*3/uL (ref 0.1–1.0)
Monocytes Relative: 8 %
NEUTROS ABS: 6.1 10*3/uL (ref 1.7–7.7)
NEUTROS PCT: 67 %
Platelets: 243 10*3/uL (ref 150–400)
RBC: 3.82 MIL/uL — AB (ref 4.22–5.81)
RDW: 16.5 % — ABNORMAL HIGH (ref 11.5–15.5)
WBC: 9.2 10*3/uL (ref 4.0–10.5)

## 2017-12-06 LAB — GLUCOSE, CAPILLARY
GLUCOSE-CAPILLARY: 162 mg/dL — AB (ref 65–99)
GLUCOSE-CAPILLARY: 164 mg/dL — AB (ref 65–99)
GLUCOSE-CAPILLARY: 169 mg/dL — AB (ref 65–99)
GLUCOSE-CAPILLARY: 108 mg/dL — AB (ref 65–99)
Glucose-Capillary: 153 mg/dL — ABNORMAL HIGH (ref 65–99)
Glucose-Capillary: 176 mg/dL — ABNORMAL HIGH (ref 65–99)

## 2017-12-06 NOTE — Progress Notes (Signed)
CRITICAL VALUE ALERT  Critical Value:  Chloride greater than 130   Date & Time Notied:  12/06/2017  Provider Notified: [facility]  Orders Received/Actions taken: no orders received

## 2017-12-06 NOTE — Progress Notes (Signed)
Paged [facility] with critical lab. Chloride greater than 130

## 2017-12-06 NOTE — Progress Notes (Signed)
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 Over the past 2-3 days prior to admission. Patient has not been eating and drinking much. According to his caregiver, patient has also lost about 15 pounds in the last 2-3 weeks.  Reason for Visit: Acute metabolic encephalopathy  Consultants: None  Procedures: None  Antibiotics: Ceftriaxone  Subjective/Interval History: Patient sleeping. He grimaces on painful stimuli. Does not wake up.  ROS: Unable to do  Objective:  Vital Signs  Vitals:   12/06/17 0056 12/06/17 0524 12/06/17 0923 12/06/17 1414  BP: 113/62 (!) 115/56 (!) 104/56 106/67  Pulse: 88 72 70 84  Resp: 20 20 20 20   Temp: 99.4 F (37.4 C) 98.8 F (37.1 C) 98.4 F (36.9 C) 97.7 F (36.5 C)  TempSrc: Axillary Axillary Axillary Oral  SpO2: 96% 98% 98% 94%    Intake/Output Summary (Last 24 hours) at 12/06/2017 1503 Last data filed at 12/06/2017 0600 Gross per 24 hour  Intake 1850 ml  Output 1550 ml  Net 300 ml   There were no vitals filed for this visit.  General appearance: Asleep. He grimaces on painful stimuli. Head: Normocephalic, without obvious abnormality, atraumatic Resp: clear to auscultation bilaterally Cardio: regular rate and rhythm, S1, S2 normal, no murmur, click, rub or gallop GI: soft, non-tender; bowel sounds normal; no masses,  no organomegaly Extremities: extremities  normal, atraumatic, no cyanosis or edema Neurologic: Patient has right hemiparesis.  Lab Results:  Data Reviewed: I have personally reviewed following labs and imaging studies  CBC: Recent Labs  Lab 12/04/17 2106 12/06/17 0735  WBC 9.5 9.2  NEUTROABS  --  6.1  HGB 12.4* 11.8*  HCT 38.9* 38.6*  MCV 98.7 101.0*  PLT 255 742    Basic Metabolic Panel: Recent Labs  Lab 12/05/17 0459 12/05/17 0835 12/05/17 1841 12/06/17 0000 12/06/17 0735  NA 155* 157* 156* 156* 154*  K 3.4* 3.5 4.5 3.5 4.0  CL 129* 128* >130* >130* >130*  CO2 17* 20* 17* 20* 19*  GLUCOSE 153* 144* 131* 165* 173*  BUN 57* 54* 54* 54* 53*  CREATININE 1.47* 1.49* 1.49* 1.73* 1.66*  CALCIUM 8.7* 8.9 8.7* 8.7* 8.8*    GFR: CrCl cannot be calculated (Unknown ideal weight.).  Liver Function Tests: Recent Labs  Lab 12/03/17 0300 12/04/17 2106 12/05/17 0459 12/06/17 0735  AST 374 442* 251* 306*  ALT 284 348* 269* 294*  ALKPHOS  --  270* 229* 253*  BILITOT  --  0.7 0.8 0.7  PROT  --  8.5* 7.1 7.6  ALBUMIN  --  2.6* 2.4* 2.5*    Recent Labs  Lab 12/04/17 2106  LIPASE 22   Recent Labs  Lab 12/04/17 2254  AMMONIA 16    Coagulation Profile: Recent Labs  Lab 12/04/17 2304  INR 1.24    Cardiac Enzymes: Recent Labs  Lab 12/04/17 2055 12/05/17 0459  TROPONINI 0.03* <0.03    CBG: Recent Labs  Lab 12/05/17 2043 12/05/17 2334 12/06/17 0338 12/06/17 0735 12/06/17 1127  GLUCAP 136* 148* 162* 164*  169*     Recent Results (from the past 240 hour(s))  Urine culture     Status: Abnormal (Preliminary result)   Collection Time: 12/04/17 10:59 PM  Result Value Ref Range Status   Specimen Description URINE, RANDOM  Final   Special Requests NONE  Final   Culture >=100,000 COLONIES/mL PSEUDOMONAS AERUGINOSA (A)  Final   Report Status PENDING  Incomplete  MRSA PCR Screening     Status: Abnormal   Collection Time: 12/05/17  3:07 AM  Result Value Ref Range Status   MRSA by PCR POSITIVE  (A) NEGATIVE Final    Comment:        The GeneXpert MRSA Assay (FDA approved for NASAL specimens only), is one component of a comprehensive MRSA colonization surveillance program. It is not intended to diagnose MRSA infection nor to guide or monitor treatment for MRSA infections. RESULT CALLED TO, READ BACK BY AND VERIFIED WITH: [place] RN AT [phone] 12/05/17 BY A.DAVIS       Radiology Studies: Ct Head Wo Contrast  Result Date: 12/05/2017 CLINICAL DATA:  Altered level of consciousness. EXAM: CT HEAD WITHOUT CONTRAST TECHNIQUE: Contiguous axial images were obtained from the base of the skull through the vertex without intravenous contrast. COMPARISON:  Head CT 07/04/2017 FINDINGS: Brain: Sequela of remote left MCA distribution infarct with large area of encephalomalacia. Mild ex vacuo dilatation of the left lateral ventricle. Chronic 6 mm left frontal subdural fluid collection, unchanged in thickness, slight interval increase in density from prior exam likely related to recent IV contrast administration. No CT findings of acute ischemia. Advanced cerebral atrophy. No midline shift. Vascular: Increased density of intracranial vasculature likely secondary to recent IV contrast administration for abdominal CT. Skull: Prior left frontal craniotomy.  No fracture or focal lesion. Sinuses/Orbits: Minimal chronic mucosal thickening of right maxillary sinus. No sinus fluid levels. Bilateral cataract resection. Other: None. IMPRESSION: 1. Large remote left MCA infarct with encephalomalacia. Advanced cerebral atrophy. 2. Stable chronic left frontal subdural collection measuring 6 mm. 3. Increased density of intracranial vasculature likely secondary to IV contrast administration for abdominal CT 1-1/2 hours prior. 4. No evidence of acute abnormality. Electronically Signed   By: Jeb Levering M.D.   On: 12/05/2017 00:14   Ct Abdomen Pelvis W Contrast  Result Date: 12/04/2017 CLINICAL DATA:  Abdominal  pain. History of gastrointestinal bleeding. History of prostate carcinoma EXAM: CT ABDOMEN AND PELVIS WITH CONTRAST TECHNIQUE: Multidetector CT imaging of the abdomen and pelvis was performed using the standard protocol following bolus administration of intravenous contrast. CONTRAST:  165mL ISOVUE-300 IOPAMIDOL (ISOVUE-300) INJECTION 61% COMPARISON:  June 27, 2017 FINDINGS: Lower chest: There is atelectatic change in the lung bases with small pleural effusions bilaterally. There are foci of coronary artery calcification. Hepatobiliary: No intrahepatic lesions are evident. There is a fluid collection along the subcapsular aspect of the liver posteriorly toward the right measuring 6.2 x 2.3 x 1.8 cm, an age uncertain subcapsular liver hematoma. No other perihepatic fluid evident. Gallbladder appears borderline distended without gallbladder wall thickening. There is no appreciable biliary duct dilatation. Pancreas: There is no pancreatic mass or inflammatory focus. Spleen: No splenic lesions are evident. Adrenals/Urinary Tract: Adrenals bilaterally appear normal. There are parapelvic cysts in each kidney. There is a cyst in the periphery of the right kidney measuring 1.3 x 1.1 cm. There is no appreciable hydronephrosis on either side. There is no renal or ureteral calculus on either side. Urinary bladder wall is mildly thickened. Stomach/Bowel: The rectum is distended  with stool. There is no perirectal stranding or rectal wall thickening. There is also fairly extensive stool throughout the mid distal sigmoid colon without inflammatory changes. There is no bowel obstruction. No free air or portal venous air. A gastrostomy catheter tip is positioned within the stomach. There is no abnormal fluid along the course of this catheter. Vascular/Lymphatic: There is atherosclerotic calcification throughout the aorta and iliac arteries. There is calcification in the major mesenteric arterial vessels. There is no major mesenteric  arterial obstruction. No adenopathy is appreciable abdomen or pelvis. Reproductive: Prostate is absent.  There is no pelvic mass evident. Other: Appendix appears unremarkable. No abscess or ascites evident in the abdomen or pelvis. There is postoperative change along the anterior pelvic wall. There is fat in both inguinal rings. There is a calcified structure within the left inguinal ring, a likely small calcified hematoma. No bowel extends into this area. Musculoskeletal: There is degenerative change throughout the lower thoracic and lumbar spine. No blastic or lytic bone lesions are identified. There is no intramuscular lesion. The proximal thigh muscles are somewhat atrophic on both sides. No abdominal wall lesion evident. Patient is status post median sternotomy. IMPRESSION: 1. Rectum is rather markedly distended with stool. No perirectal wall thickening or perirectal stranding evident. 2. Mild thickening in the urinary bladder wall. Suspect cystitis. No renal or ureteral calculi. No appreciable hydronephrosis. There are parapelvic cysts bilaterally. 3.  Prostate absent. 4. Age uncertain small subcapsular hematoma along the posterior rightward aspect of the liver. 5. Gallbladder mildly distended without gallbladder wall thickening. 6. Aortoiliac atherosclerosis. Calcification in major mesenteric arterial vessels. Multiple foci of coronary artery calcification noted. 7.  Gastrostomy catheter tip positioned within the stomach. 8. Left inguinal hernia containing a small calcified hematoma. No bowel extends into this hernia. Aortic Atherosclerosis (ICD10-I70.0). Electronically Signed   By: Lowella Grip III M.D.   On: 12/04/2017 22:46   Dg Chest Portable 1 View  Result Date: 12/04/2017 CLINICAL DATA:  Abdominal pain. Poor intake. History of stroke and GI bleed. EXAM: PORTABLE CHEST 1 VIEW COMPARISON:  Chest radiograph July 15, 2017 FINDINGS: Cardiac silhouette is mildly enlarged. Status post median  sternotomy for CABG. Mildly calcified aortic knob. Improved aeration LEFT lung base without pleural effusion for focal consolidation. No pneumothorax. Osteopenia. Surgical clips project in LEFT abdomen. IMPRESSION: Mild cardiomegaly, status post CABG.  No acute pulmonary process. Electronically Signed   By: Elon Alas M.D.   On: 12/04/2017 21:43     Medications:  Scheduled: . amLODipine  5 mg Per Tube Daily  . famotidine  20 mg Per Tube Daily  . feeding supplement (PRO-STAT SUGAR FREE 64)  30 mL Per Tube BID  . free water  400 mL Per Tube Q4H  . insulin aspart  0-9 Units Subcutaneous Q4H  . loratadine  10 mg Per Tube Daily  . metoCLOPramide  5 mg Per Tube TID AC  . metoprolol tartrate  100 mg Per Tube BID  . mineral oil  1 enema Rectal Once  . potassium chloride  20 mEq Per Tube Daily  . pravastatin  40 mg Per Tube QHS  . propranolol  40 mg Per Tube BID  . senna-docusate  1 tablet Per Tube QHS   Continuous: . cefTRIAXone (ROCEPHIN)  IV Stopped (12/05/17 2345)  . dextrose 75 mL/hr at 12/06/17 3220   URK:[id number] **OR** acetaminophen, bisacodyl, HYDROcodone-acetaminophen, ondansetron **OR** ondansetron (ZOFRAN) IV, polyethylene glycol, sodium phosphate  Assessment/Plan:  Principal Problem:   Acute lower UTI  Active Problems:   Coronary artery disease due to lipid rich plaque s/p CABG 1996   Chronic atrial fibrillation (HCC)   History of subdural hematoma   Type 2 diabetes mellitus with diabetic neuropathy, without long-term current use of insulin (HCC)   Essential hypertension   Cerebrovascular accident (CVA) due to embolism of left middle cerebral artery (HCC)   Hypernatremia   AKI (acute kidney injury) ([place])   Acute encephalopathy   Subcapsular hematoma of liver   UTI (urinary tract infection)   Abnormal transaminases   Transaminitis    Acute metabolic encephalopathy. Likely multifactorial including hyponatremia, renal failure, UTI. CT head did not show any  new findings. Constipation could also be contributing. Continue to correct electrolyte abnormalities.  Urinary tract infection. Urine culture positive for Pseudomonas. Final sensitivities are pending. Continue ceftriaxone for now.  Acute kidney injury with hypernatremia This is most likely secondary to poor oral intake over the last many days. Patient does live in a skilled nursing facility. He does have a PEG tube although normally eats by mouth. Continue IV fluids. Await improvement in sodium level. Monitor urine output.  Subcapsular liver hematoma with elevated LFTs. Hemodynamically stable. Monitor LFTs closely. The hematoma is small and age is uncertain.  Chronic atrial fibrillation Rate control. He is not on anticoagulation due to history of hemorrhagic stroke, subdural hematoma. Continue metoprolol.  History of diabetes mellitus type 2. HbA1c was 6.8 in July. SSI. Holding his oral agents.  History of essential hypertension. Monitor blood pressures closely. Continue home medications.  Constipation. Rectal stool noted on CT scan. He was disimpacted in the emergency department. Needs good bowel regimen on discharge.  History of stroke. He's had a large left MCA stroke resulting in right hemiparesis. It appears that he had significant dysphagia as he still has a PEG tube. Although this is not being used for feeding purposes anymore. Continue to monitor for now.  Microcytic anemia. Check anemia panel. No evidence for overt bleeding.  DVT Prophylaxis: SCDs    Code Status: DNR  Family Communication: Discussed with the caregiver at bedside  Disposition Plan: Management as outlined above.    LOS: 1 day   [place] [facility] Pager [phone] 12/06/2017, 3:03 PM  If 7PM-7AM, please contact night-coverage at [email or web address], password [facility]

## 2017-12-06 NOTE — Progress Notes (Signed)
Patient breathing with mouth open, and appears to be sleeping, however, his eyes are wide open. Caretaker at side of bed is concerned. She reports she has not see this before.  Vital signs appear normal. A foot massage did cause patient to close his eyes. At times he reaches up into the air with his L arm as if he is trying to grab something. Will continue to monitor patient and give emotional support to caregiver who is at bedside.

## 2017-12-06 NOTE — Progress Notes (Signed)
CRITICAL VALUE ALERT  Critical Value: Chloride  >130  Date & Time Notied:  12/06/17 @ 9290  Provider Notified: Dr. [place] Pink text paged  Orders Received/Actions taken: Awaiting call back/orders

## 2017-12-07 ENCOUNTER — Other Ambulatory Visit: Payer: Self-pay

## 2017-12-07 LAB — GLUCOSE, CAPILLARY
GLUCOSE-CAPILLARY: 131 mg/dL — AB (ref 65–99)
GLUCOSE-CAPILLARY: 180 mg/dL — AB (ref 65–99)
GLUCOSE-CAPILLARY: 154 mg/dL — AB (ref 65–99)
Glucose-Capillary: 102 mg/dL — ABNORMAL HIGH (ref 65–99)
Glucose-Capillary: 158 mg/dL — ABNORMAL HIGH (ref 65–99)

## 2017-12-07 LAB — CBC
HEMATOCRIT: 37 % — AB (ref 39.0–52.0)
Hemoglobin: 11.6 g/dL — ABNORMAL LOW (ref 13.0–17.0)
MCH: 30.9 pg (ref 26.0–34.0)
MCHC: 31.4 g/dL (ref 30.0–36.0)
MCV: 98.7 fL (ref 78.0–100.0)
Platelets: 250 10*3/uL (ref 150–400)
RBC: 3.75 MIL/uL — ABNORMAL LOW (ref 4.22–5.81)
RDW: 15.9 % — AB (ref 11.5–15.5)
WBC: 8.3 10*3/uL (ref 4.0–10.5)

## 2017-12-07 LAB — COMPREHENSIVE METABOLIC PANEL
ALBUMIN: 2.3 g/dL — AB (ref 3.5–5.0)
ALK PHOS: 244 U/L — AB (ref 38–126)
ALT: 240 U/L — AB (ref 17–63)
AST: 200 U/L — AB (ref 15–41)
Anion gap: 6 (ref 5–15)
BILIRUBIN TOTAL: 0.8 mg/dL (ref 0.3–1.2)
BUN: 47 mg/dL — AB (ref 6–20)
CALCIUM: 8.8 mg/dL — AB (ref 8.9–10.3)
CO2: 20 mmol/L — ABNORMAL LOW (ref 22–32)
Chloride: 124 mmol/L — ABNORMAL HIGH (ref 101–111)
Creatinine, Ser: 1.46 mg/dL — ABNORMAL HIGH (ref 0.61–1.24)
GFR calc Af Amer: 50 mL/min — ABNORMAL LOW (ref >60)
GFR calc non Af Amer: 43 mL/min — ABNORMAL LOW (ref >60)
GLUCOSE: 121 mg/dL — AB (ref 65–99)
Potassium: 3.5 mmol/L (ref 3.5–5.1)
Sodium: 150 mmol/L — ABNORMAL HIGH (ref 135–145)
TOTAL PROTEIN: 7.3 g/dL (ref 6.5–8.1)

## 2017-12-07 LAB — URINE CULTURE: Culture: >=100000 — AB

## 2017-12-07 LAB — RETICULOCYTES
RBC.: 3.75 MIL/uL — AB (ref 4.22–5.81)
RETIC COUNT ABSOLUTE: 22.5 10*3/uL (ref 19.0–186.0)
Retic Ct Pct: 0.6 % (ref 0.4–3.1)

## 2017-12-07 LAB — IRON AND TIBC
Iron: 61 ug/dL (ref 45–182)
Saturation Ratios: 34 % (ref 17.9–39.5)
TIBC: 182 ug/dL — ABNORMAL LOW (ref 250–450)
UIBC: 121 ug/dL

## 2017-12-07 LAB — FOLATE: FOLATE: 9.8 ng/mL (ref >5.9)

## 2017-12-07 LAB — VITAMIN B12: VITAMIN B 12: 430 pg/mL (ref 180–914)

## 2017-12-07 LAB — FERRITIN: Ferritin: 849 ng/mL — ABNORMAL HIGH (ref 24–336)

## 2017-12-07 MED ORDER — CEFTRIAXONE SODIUM 1 G IJ SOLR: 1.0000 g | INTRAMUSCULAR | Status: DC

## 2017-12-07 MED ORDER — ENSURE ENLIVE PO LIQD
1.0000 | Freq: Three times a day (TID) | ORAL | Status: DC
  Administered 2017-12-07 – 2017-12-12 (×15): 237 mL

## 2017-12-07 MED ORDER — DEXTROSE 5 % IV SOLN
1.0000 g | Freq: Two times a day (BID) | INTRAVENOUS | Status: DC
  Administered 2017-12-07 – 2017-12-10 (×7): 1 g via INTRAVENOUS
  Filled 2017-12-07 (×7): qty 1

## 2017-12-07 NOTE — Progress Notes (Signed)
[facility] PROGRESS NOTE  Robert Keith HQP:[id number] DOB: [birth year] DOA: 12/04/2017  PCP: Leonel Ramsay, MD  Brief History/Interval Summary: 83 year old Caucasian male with a past medical history of prostate cancer status post resection, currently, artery disease status post CABG in the past, hypertension, diabetes type 2, chronic subdural hematoma, previous history of large left MCA stroke with right-sided weakness, history of intracranial hemorrhage, chronic atrial fibrillation, not on anticoagulation, presented from a skilled nursing facility due to worsening confusion, hallucinations and poor appetite. Over the past 2-3 days prior to admission. Patient has not been eating and drinking much. According to his caregiver, patient has also lost about 15 pounds in the last 2-3 weeks.  Reason for Visit: Acute metabolic encephalopathy  Consultants: None  Procedures: None  Antibiotics: Ceftriaxone will be changed to ceftazidime  Subjective/Interval History: Patient noted to be awake this morning.  His caregiver is at the bedside.  He is smiling.  Does not appear to be in any discomfort.  Still confused.  ROS: Unable to do  Objective:  Vital Signs  Vitals:   12/06/17 2058 12/07/17 0111 12/07/17 0406 12/07/17 0913  BP: 98/72 105/69 (!) 151/115 115/71  Pulse: 98 87 95 94  Resp: 18 18 18 17   Temp: 98.9 F (37.2 C) 98.8 F (37.1 C) 98.7 F (37.1 C) 97.9 F (36.6 C)  TempSrc: Axillary Axillary Axillary Axillary  SpO2: 99% 100% 99% 98%    Intake/Output Summary (Last 24 hours) at 12/07/2017 0945 Last data filed at 12/06/2017 2059 Gross per 24 hour  Intake -  Output 1500 ml  Net -1500 ml   There were no vitals filed for this visit.  General appearance: Awake alert.  Distracted. Resp: Clear to auscultation bilaterally.  No wheezing rales or rhonchi.  Diminished air entry at the bases Cardio: S1-S2 is normal regular.  No S3-S4.  No rubs murmurs or bruit GI:  Abdomen is soft.  Nontender nondistended.  Bowel sounds are present.  No masses organomegaly Extremities: No edema Neurologic: Patient has old right hemiparesis.  Lab Results:  Data Reviewed: I have personally reviewed following labs and imaging studies  CBC: Recent Labs  Lab 12/04/17 2106 12/06/17 0735 12/07/17 0356  WBC 9.5 9.2 8.3  NEUTROABS  --  6.1  --   HGB 12.4* 11.8* 11.6*  HCT 38.9* 38.6* 37.0*  MCV 98.7 101.0* 98.7  PLT 255 243 599    Basic Metabolic Panel: Recent Labs  Lab 12/05/17 1841 12/06/17 0000 12/06/17 0735 12/06/17 1737 12/07/17 0356  NA 156* 156* 154* 152* 150*  K 4.5 3.5 4.0 3.7 3.5  CL >130* >130* >130* 127* 124*  CO2 17* 20* 19* 19* 20*  GLUCOSE 131* 165* 173* 130* 121*  BUN 54* 54* 53* 51* 47*  CREATININE 1.49* 1.73* 1.66* 1.56* 1.46*  CALCIUM 8.7* 8.7* 8.8* 8.7* 8.8*    GFR: CrCl cannot be calculated (Unknown ideal weight.).  Liver Function Tests: Recent Labs  Lab 12/03/17 0300 12/04/17 2106 12/05/17 0459 12/06/17 0735 12/07/17 0356  AST 374 442* 251* 306* 200*  ALT 284 348* 269* 294* 240*  ALKPHOS  --  270* 229* 253* 244*  BILITOT  --  0.7 0.8 0.7 0.8  PROT  --  8.5* 7.1 7.6 7.3  ALBUMIN  --  2.6* 2.4* 2.5* 2.3*    Recent Labs  Lab 12/04/17 2106  LIPASE 22   Recent Labs  Lab 12/04/17 2254  AMMONIA 16    Coagulation Profile: Recent Labs  Lab  12/04/17 2304  INR 1.24    Cardiac Enzymes: Recent Labs  Lab 12/04/17 2055 12/05/17 0459  TROPONINI 0.03* <0.03    CBG: Recent Labs  Lab 12/06/17 1615 12/06/17 2043 12/06/17 2354 12/07/17 0409 12/07/17 0829  GLUCAP 108* 176* 153* 102* 131*     Recent Results (from the past 240 hour(s))  Urine culture     Status: Abnormal   Collection Time: 12/04/17 10:59 PM  Result Value Ref Range Status   Specimen Description URINE, RANDOM  Final   Special Requests NONE  Final   Culture >=100,000 COLONIES/mL PSEUDOMONAS AERUGINOSA (A)  Final   Report Status 12/07/2017  FINAL  Final   Organism ID, Bacteria PSEUDOMONAS AERUGINOSA (A)  Final      Susceptibility   Pseudomonas aeruginosa - MIC*    CEFTAZIDIME 4 SENSITIVE Sensitive     CIPROFLOXACIN <=0.25 SENSITIVE Sensitive     GENTAMICIN <=1 SENSITIVE Sensitive     IMIPENEM 1 SENSITIVE Sensitive     PIP/TAZO 16 SENSITIVE Sensitive     CEFEPIME 2 SENSITIVE Sensitive     * >=100,000 COLONIES/mL PSEUDOMONAS AERUGINOSA  MRSA PCR Screening     Status: Abnormal   Collection Time: 12/05/17  3:07 AM  Result Value Ref Range Status   MRSA by PCR POSITIVE (A) NEGATIVE Final    Comment:        The GeneXpert MRSA Assay (FDA approved for NASAL specimens only), is one component of a comprehensive MRSA colonization surveillance program. It is not intended to diagnose MRSA infection nor to guide or monitor treatment for MRSA infections. RESULT CALLED TO, READ BACK BY AND VERIFIED WITH: [place] RN AT [phone] 12/05/17 BY A.DAVIS       Radiology Studies: No results found.   Medications:  Scheduled: . amLODipine  5 mg Per Tube Daily  . famotidine  20 mg Per Tube Daily  . feeding supplement (PRO-STAT SUGAR FREE 64)  30 mL Per Tube BID  . free water  400 mL Per Tube Q4H  . insulin aspart  0-9 Units Subcutaneous Q4H  . loratadine  10 mg Per Tube Daily  . metoCLOPramide  5 mg Per Tube TID AC  . metoprolol tartrate  100 mg Per Tube BID  . mineral oil  1 enema Rectal Once  . potassium chloride  20 mEq Per Tube Daily  . propranolol  40 mg Per Tube BID  . senna-docusate  1 tablet Per Tube QHS   Continuous: . cefTRIAXone (ROCEPHIN)  IV Stopped (12/06/17 2357)  . dextrose 75 mL/hr at 12/07/17 9629   BMW:[id number] **OR** acetaminophen, bisacodyl, HYDROcodone-acetaminophen, ondansetron **OR** ondansetron (ZOFRAN) IV, polyethylene glycol, sodium phosphate  Assessment/Plan:  Principal Problem:   Acute lower UTI Active Problems:   Coronary artery disease due to lipid rich plaque s/p CABG 1996   Chronic  atrial fibrillation (HCC)   History of subdural hematoma   Type 2 diabetes mellitus with diabetic neuropathy, without long-term current use of insulin (HCC)   Essential hypertension   Cerebrovascular accident (CVA) due to embolism of left middle cerebral artery (HCC)   Hypernatremia   AKI (acute kidney injury) ([place])   Acute encephalopathy   Subcapsular hematoma of liver   UTI (urinary tract infection)   Abnormal transaminases   Transaminitis    Acute metabolic encephalopathy. Likely multifactorial including hyponatremia, renal failure, UTI. CT head did not show any new findings. Constipation could have also contributed.  Continue to correct electrolyte abnormalities.  His mental status is noted to  be better today compared to yesterday.  Continue to monitor.    Urinary tract infection. Urine culture positive for Pseudomonas.  Change to ceftazidime.  When he is ready to take orally will be transitioned to Cipro.  Alternatively this can also be given through his PEG tube.  Acute kidney injury with hypernatremia This is most likely secondary to poor oral intake over the last many days. Patient does live in a skilled nursing facility. He does have a PEG tube although normally eats by mouth.  Renal function is slowly improving.  Continue to monitor urine output.  Sodium level is also improving.  Continue D5 infusion.  He is also getting free water through his PEG tube.    Subcapsular liver hematoma with elevated LFTs. Hemodynamically stable.  LFTs are slowly improving.  The hematoma is small and age is uncertain.  Chronic atrial fibrillation Rate control. He is not on anticoagulation due to history of hemorrhagic stroke, subdural hematoma. Continue metoprolol.  History of diabetes mellitus type 2. HbA1c was 6.8 in July. SSI. Holding his oral agents.  Well controlled.  History of essential hypertension. Blood pressures are reasonably well controlled. Continue home  medications.  Constipation. Rectal stool noted on CT scan. He was disimpacted in the emergency department. Needs good bowel regimen on discharge.  History of stroke. He's had a large left MCA stroke resulting in right hemiparesis. It appears that he had significant dysphagia as he still has a PEG tube. Although this is not being used for feeding purposes anymore. Continue to monitor for now.  Macrocytic anemia. Hemoglobin is stable.  Vitamin B12 level 430.  Folate 9.8.  Ferritin 849.  DVT Prophylaxis: SCDs    Code Status: DNR  Family Communication: Discussed with the caregiver at bedside  Disposition Plan: Continue management as outlined above.  Await for further improvement.  He will return to skilled nursing facility when ready for discharge.    LOS: 2 days   [place] [facility] Pager [phone] 12/07/2017, 9:45 AM  If 7PM-7AM, please contact night-coverage at [email or web address], password [facility]

## 2017-12-07 NOTE — Progress Notes (Signed)
Pharmacy Antibiotic Note  Robert Keith is a 83 y.o. male admitted on 12/04/2017 with a  UTI.  Pharmacy has been consulted for ceftazidime dosing. He presented with increased confusion and a fever. His urine culture grew pseudomonas that is pan-sensitive. Scr is 1.4, WBC 8.3, and currently afebrile.  Plan: Start ceftazidime 1g Q12h Determine LOT, monitor renal function  Temp (24hrs), Avg:98.4 F (36.9 C), Min:97.7 F (36.5 C), Max:98.9 F (37.2 C)  Recent Labs  Lab 12/04/17 2106  12/05/17 1841 12/06/17 0000 12/06/17 0735 12/06/17 1737 12/07/17 0356  WBC 9.5  --   --   --  9.2  --  8.3  CREATININE 1.50*   < > 1.49* 1.73* 1.66* 1.56* 1.46*   < > = values in this interval not displayed.    Estimated Creatinine Clearance: 41.5 mL/min (A) (by C-G formula based on SCr of 1.46 mg/dL (H)).    Antimicrobials this admission: Ceftriaxone 12/20>>12/22 Ceftazidime 12/22>>  Microbiology results: 12/19 UCx: Pseudomonas (pan-sensitive) 12/20  MRSA PCR: positive   Patterson Hammersmith PharmD PGY1 Pharmacy Practice Resident 12/07/2017 12:52 PM Pager: [phone] Phone: [phone]

## 2017-12-08 DIAGNOSIS — I959 Hypotension, unspecified: Secondary | ICD-10-CM

## 2017-12-08 LAB — GLUCOSE, CAPILLARY
GLUCOSE-CAPILLARY: 120 mg/dL — AB (ref 65–99)
GLUCOSE-CAPILLARY: 213 mg/dL — AB (ref 65–99)
Glucose-Capillary: 126 mg/dL — ABNORMAL HIGH (ref 65–99)
Glucose-Capillary: 134 mg/dL — ABNORMAL HIGH (ref 65–99)
Glucose-Capillary: 170 mg/dL — ABNORMAL HIGH (ref 65–99)
Glucose-Capillary: 173 mg/dL — ABNORMAL HIGH (ref 65–99)

## 2017-12-08 LAB — CBC
HCT: 37.6 % — ABNORMAL LOW (ref 39.0–52.0)
Hemoglobin: 11.9 g/dL — ABNORMAL LOW (ref 13.0–17.0)
MCH: 30.7 pg (ref 26.0–34.0)
MCHC: 31.6 g/dL (ref 30.0–36.0)
MCV: 97.2 fL (ref 78.0–100.0)
PLATELETS: 197 10*3/uL (ref 150–400)
RBC: 3.87 MIL/uL — ABNORMAL LOW (ref 4.22–5.81)
RDW: 15 % (ref 11.5–15.5)
WBC: 8.4 10*3/uL (ref 4.0–10.5)

## 2017-12-08 LAB — COMPREHENSIVE METABOLIC PANEL
ALT: 258 U/L — ABNORMAL HIGH (ref 17–63)
AST: 302 U/L — AB (ref 15–41)
Albumin: 2.3 g/dL — ABNORMAL LOW (ref 3.5–5.0)
Alkaline Phosphatase: 245 U/L — ABNORMAL HIGH (ref 38–126)
Anion gap: 8 (ref 5–15)
BUN: 41 mg/dL — ABNORMAL HIGH (ref 6–20)
CALCIUM: 8.3 mg/dL — AB (ref 8.9–10.3)
CO2: 19 mmol/L — AB (ref 22–32)
CREATININE: 1.12 mg/dL (ref 0.61–1.24)
Chloride: 114 mmol/L — ABNORMAL HIGH (ref 101–111)
GFR, EST NON AFRICAN AMERICAN: 59 mL/min — AB (ref >60)
GLUCOSE: 140 mg/dL — AB (ref 65–99)
Potassium: 3.3 mmol/L — ABNORMAL LOW (ref 3.5–5.1)
Sodium: 141 mmol/L (ref 135–145)
TOTAL PROTEIN: 6.4 g/dL — AB (ref 6.5–8.1)
Total Bilirubin: 0.9 mg/dL (ref 0.3–1.2)

## 2017-12-08 MED ORDER — METOPROLOL TARTRATE 50 MG PO TABS: 50.0000 mg | ORAL_TABLET | Freq: Two times a day (BID) | ORAL | Status: DC

## 2017-12-08 MED ORDER — POLYETHYLENE GLYCOL 3350 17 G PO PACK
17.0000 g | PACK | Freq: Two times a day (BID) | ORAL | Status: DC
  Administered 2017-12-08 – 2017-12-10 (×5): 17 g
  Filled 2017-12-08 (×7): qty 1

## 2017-12-08 MED ORDER — DOCUSATE SODIUM 100 MG PO CAPS
100.0000 mg | ORAL_CAPSULE | Freq: Two times a day (BID) | ORAL | Status: DC
  Administered 2017-12-08 – 2017-12-11 (×6): 100 mg via ORAL
  Filled 2017-12-08 (×6): qty 1

## 2017-12-08 MED ORDER — CHLORHEXIDINE GLUCONATE CLOTH 2 % EX PADS
6.0000 | MEDICATED_PAD | Freq: Every day | CUTANEOUS | Status: AC
  Administered 2017-12-08 – 2017-12-12 (×5): 6 via TOPICAL

## 2017-12-08 MED ORDER — MUPIROCIN 2 % EX OINT
1.0000 "application " | TOPICAL_OINTMENT | Freq: Two times a day (BID) | CUTANEOUS | Status: AC
  Administered 2017-12-08 – 2017-12-12 (×10): 1 via NASAL
  Filled 2017-12-08: qty 22

## 2017-12-08 MED ORDER — AMLODIPINE BESYLATE 5 MG PO TABS: 5.0000 mg | ORAL_TABLET | Freq: Every day | ORAL | Status: DC

## 2017-12-08 MED ORDER — POTASSIUM CHLORIDE 20 MEQ/15ML (10%) PO SOLN
20.0000 meq | Freq: Once | ORAL | Status: AC
  Administered 2017-12-08: 20 meq via ORAL
  Filled 2017-12-08: qty 15

## 2017-12-08 NOTE — Progress Notes (Signed)
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 Over the past 2-3 days prior to admission. Patient has not been eating and drinking much. According to his caregiver, patient has also lost about 15 pounds in the last 2-3 weeks.   Reason for Visit: Acute metabolic encephalopathy  Consultants: None  Procedures: None  Antibiotics: Ceftriaxone changed to ceftazidime on 12/22  Subjective/Interval History: Patient asleep this morning.  Easily arousable.  Remains confused.  His daughter in law is at the bedside.    ROS: Unable to do  Objective:  Vital Signs  Vitals:   12/07/17 2059 12/07/17 2152 12/08/17 0046 12/08/17 0500  BP: (!) 91/55 (!) 91/55 (!) 88/53 (!) 100/57  Pulse: 92  90 84  Resp: 20  20 18   Temp: 98 F (36.7 C)  98.6 F (37 C) 98.8 F (37.1 C)  TempSrc: Oral  Oral Axillary  SpO2: 100%  96% 98%  Weight:    77.4 kg (170 lb 10.2 oz)  Height:        Intake/Output Summary (Last 24 hours) at 12/08/2017 1026 Last data filed at 12/08/2017 0600 Gross per 24 hour  Intake 4850 ml  Output 3775 ml  Net 1075 ml   Filed Weights   12/07/17 1200 12/08/17 0500  Weight: 75.3 kg (166 lb 0.1 oz) 77.4 kg (170 lb 10.2 oz)    General appearance: Asleep but arousable.  In no distress. Resp: Lungs remain clear to auscultation bilaterally.   Cardio: S1-S2 remains normal regular.  No S3-S4  GI: Abdomen remains  soft.  Nontender nondistended. Bowel sounds are present.  No masses organomegaly Extremities: No edema Neurologic: Patient has old right hemiparesis.  Lab Results:  Data Reviewed: I have personally reviewed following labs and imaging studies  CBC: Recent Labs  Lab 12/04/17 2106 12/06/17 0735 12/07/17 0356 12/08/17 0644  WBC 9.5 9.2 8.3 8.4  NEUTROABS  --  6.1  --   --   HGB 12.4* 11.8* 11.6* 11.9*  HCT 38.9* 38.6* 37.0* 37.6*  MCV 98.7 101.0* 98.7 97.2  PLT 255 243 250 528    Basic Metabolic Panel: Recent Labs  Lab 12/06/17 0000 12/06/17 0735 12/06/17 1737 12/07/17 0356 12/08/17 0644  NA 156* 154* 152* 150* 141  K 3.5 4.0 3.7 3.5 3.3*  CL >130* >130* 127* 124* 114*  CO2 20* 19* 19* 20* 19*  GLUCOSE 165* 173* 130* 121* 140*  BUN 54* 53* 51* 47* 41*  CREATININE 1.73* 1.66* 1.56* 1.46* 1.12  CALCIUM 8.7* 8.8* 8.7* 8.8* 8.3*    GFR: Estimated Creatinine Clearance: 55.7 mL/min (by C-G formula based on SCr of 1.12 mg/dL).  Liver Function Tests: Recent Labs  Lab 12/04/17 2106 12/05/17 0459 12/06/17 0735 12/07/17 0356 12/08/17 0644  AST 442* 251* 306* 200* 302*  ALT 348* 269* 294* 240* 258*  ALKPHOS 270* 229* 253* 244* 245*  BILITOT 0.7 0.8 0.7 0.8 0.9  PROT 8.5* 7.1 7.6 7.3 6.4*  ALBUMIN 2.6* 2.4* 2.5* 2.3* 2.3*    Recent Labs  Lab 12/04/17 2106  LIPASE 22   Recent Labs  Lab 12/04/17 2254  AMMONIA 16    Coagulation Profile: Recent Labs  Lab 12/04/17 2304  INR 1.24    Cardiac Enzymes: Recent Labs  Lab 12/04/17 2055 12/05/17 0459  TROPONINI 0.03* <0.03    CBG: Recent Labs  Lab 12/07/17 1659 12/07/17 2005 12/08/17 0049 12/08/17 0420 12/08/17 0745  GLUCAP 154* 158* 173* 126* 120*     Recent Results (from the past 240 hour(s))  Urine culture     Status: Abnormal   Collection Time: 12/04/17 10:59 PM  Result Value Ref Range Status   Specimen Description URINE, RANDOM  Final   Special Requests NONE  Final   Culture >=100,000  COLONIES/mL PSEUDOMONAS AERUGINOSA (A)  Final   Report Status 12/07/2017 FINAL  Final   Organism ID, Bacteria PSEUDOMONAS AERUGINOSA (A)  Final      Susceptibility   Pseudomonas aeruginosa - MIC*    CEFTAZIDIME 4 SENSITIVE Sensitive     CIPROFLOXACIN <=0.25 SENSITIVE Sensitive     GENTAMICIN <=1 SENSITIVE Sensitive     IMIPENEM 1 SENSITIVE Sensitive     PIP/TAZO 16 SENSITIVE Sensitive     CEFEPIME 2 SENSITIVE Sensitive     * >=100,000 COLONIES/mL PSEUDOMONAS AERUGINOSA  MRSA PCR Screening     Status: Abnormal   Collection Time: 12/05/17  3:07 AM  Result Value Ref Range Status   MRSA by PCR POSITIVE (A) NEGATIVE Final    Comment:        The GeneXpert MRSA Assay (FDA approved for NASAL specimens only), is one component of a comprehensive MRSA colonization surveillance program. It is not intended to diagnose MRSA infection nor to guide or monitor treatment for MRSA infections. RESULT CALLED TO, READ BACK BY AND VERIFIED WITH: [place] RN AT [phone] 12/05/17 BY A.DAVIS       Radiology Studies: No results found.   Medications:  Scheduled: . [START ON 12/09/2017] amLODipine  5 mg Per Tube Daily  . Chlorhexidine Gluconate Cloth  6 each Topical Q0600  . docusate sodium  100 mg Oral BID  . famotidine  20 mg Per Tube Daily  . feeding supplement (ENSURE ENLIVE)  1 Bottle Per Tube TID BM  . feeding supplement (PRO-STAT SUGAR FREE 64)  30 mL Per Tube BID  . free water  400 mL Per Tube Q4H  . insulin aspart  0-9 Units Subcutaneous Q4H  . loratadine  10 mg Per Tube Daily  . metoCLOPramide  5 mg Per Tube TID AC  . [START ON 12/09/2017] metoprolol tartrate  50 mg Per Tube BID  . mupirocin ointment  1 application Nasal BID  . polyethylene glycol  17 g Per Tube BID  . potassium chloride  20 mEq Per Tube Daily  . senna-docusate  1 tablet Per Tube QHS   Continuous: . cefTAZidime (FORTAZ)  IV 1 g (12/07/17 2152)  . dextrose 75 mL/hr at 12/08/17 0702   VCB:SWHQPRFFMBWGY **OR**  acetaminophen, bisacodyl, HYDROcodone-acetaminophen, ondansetron **OR** ondansetron (ZOFRAN) IV, sodium phosphate  Assessment/Plan:  Principal Problem:   Acute lower UTI Active Problems:   Coronary artery disease due to lipid rich plaque s/p CABG 1996   Chronic atrial fibrillation (HCC)   History of subdural hematoma   Type 2 diabetes mellitus with diabetic neuropathy, without long-term current use of insulin (HCC)   Essential hypertension   Cerebrovascular accident (CVA) due to embolism of left middle cerebral artery ([place])   Hypernatremia   AKI (acute kidney injury) ([place])  Acute encephalopathy   Subcapsular hematoma of liver   UTI (urinary tract infection)   Abnormal transaminases   Transaminitis    Acute metabolic encephalopathy. Likely multifactorial including hyponatremia, renal failure, UTI. CT head did not show any new findings. Constipation could have also contributed.  Electrolyte abnormalities have improved significantly.  Mental status much improved.  Continue to monitor.    Urinary tract infection with Pseudomonas Urine culture positive for Pseudomonas.  Sensitivities reviewed.  Patient was initially on ceftriaxone and has been changed over to ceftazidime.  Continue this for another 24 hours and then transition to Cipro which can be given through his PEG tube.   Acute kidney injury with hypernatremia This is most likely secondary to poor oral intake over the last many days. Patient does live in a skilled nursing facility. He does have a PEG tube although normally eats by mouth.  Patient was given IV fluids initially in the form of normal saline and then D5 infusion.  Renal function and sodium level normal today.  Stop IV fluids.  Monitor urine output.  Replace potassium.  Subcapsular liver hematoma with elevated LFTs. Hemodynamically stable.  LFTs remain elevated but stable.  The hematoma is small and age is uncertain.  Check hepatitis panel.  Chronic atrial  fibrillation Rate is controlled.  He is not on anticoagulation due to history of hemorrhagic stroke, subdural hematoma. Continue metoprolol.  History of diabetes mellitus type 2. HbA1c was 6.8 in July. SSI. Holding his oral agents.  Well controlled.  History of essential hypertension. Blood pressure is noted to be low overnight.  Patient's medications were reviewed.  He is noted to be on propranolol and metoprolol.  Propranolol will be discontinued.  His amlodipine and metoprolol will be held today.  We will resume at a lower dose tomorrow if blood pressure stabilizes.    Constipation. Rectal stool noted on CT scan. He was disimpacted in the emergency department. Needs good bowel regimen on discharge.  Still has evidence for some impaction and constipation.  Enemas will be ordered.  Increase dose of MiraLAX.  History of stroke. He's had a large left MCA stroke resulting in right hemiparesis. It appears that he had significant dysphagia as he still has a PEG tube. Although this is not being used for feeding purposes anymore. Continue to monitor for now.  Macrocytic anemia. Hemoglobin is stable.  Vitamin B12 level 430.  Folate 9.8.  Ferritin 849.  DVT Prophylaxis: SCDs    Code Status: DNR  Family Communication: Discussed with his daughter-in-law who was at the bedside. Disposition Plan: Plan as above.  Await further improvement.  He will return to skilled nursing facility when ready for discharge.    LOS: 3 days   [facility] Pager [phone] 12/08/2017, 10:26 AM  If 7PM-7AM, please contact night-coverage at [email or web address], password [facility]

## 2017-12-08 NOTE — Plan of Care (Signed)
  Education: Knowledge of General Education information will improve 12/08/2017 0313 - Progressing by Marcos Eke, RN

## 2017-12-09 ENCOUNTER — Inpatient Hospital Stay (HOSPITAL_COMMUNITY): Payer: Medicare Other

## 2017-12-09 LAB — BASIC METABOLIC PANEL
ANION GAP: 9 (ref 5–15)
BUN: 39 mg/dL — ABNORMAL HIGH (ref 6–20)
CALCIUM: 8.6 mg/dL — AB (ref 8.9–10.3)
CO2: 21 mmol/L — ABNORMAL LOW (ref 22–32)
Chloride: 111 mmol/L (ref 101–111)
Creatinine, Ser: 1.1 mg/dL (ref 0.61–1.24)
GFR calc non Af Amer: >60 mL/min (ref >60)
GLUCOSE: 196 mg/dL — AB (ref 65–99)
POTASSIUM: 3.6 mmol/L (ref 3.5–5.1)
SODIUM: 141 mmol/L (ref 135–145)

## 2017-12-09 LAB — CBC
HCT: 34.7 % — ABNORMAL LOW (ref 39.0–52.0)
Hemoglobin: 11 g/dL — ABNORMAL LOW (ref 13.0–17.0)
MCH: 30.6 pg (ref 26.0–34.0)
MCHC: 31.7 g/dL (ref 30.0–36.0)
MCV: 96.7 fL (ref 78.0–100.0)
PLATELETS: 247 10*3/uL (ref 150–400)
RBC: 3.59 MIL/uL — AB (ref 4.22–5.81)
RDW: 14.7 % (ref 11.5–15.5)
WBC: 7.7 10*3/uL (ref 4.0–10.5)

## 2017-12-09 LAB — GLUCOSE, CAPILLARY
GLUCOSE-CAPILLARY: 138 mg/dL — AB (ref 65–99)
GLUCOSE-CAPILLARY: 161 mg/dL — AB (ref 65–99)
Glucose-Capillary: 189 mg/dL — ABNORMAL HIGH (ref 65–99)
Glucose-Capillary: 194 mg/dL — ABNORMAL HIGH (ref 65–99)

## 2017-12-09 MED ORDER — METOPROLOL TARTRATE 25 MG PO TABS
25.0000 mg | ORAL_TABLET | Freq: Two times a day (BID) | ORAL | Status: DC
  Administered 2017-12-09 – 2017-12-12 (×7): 25 mg
  Filled 2017-12-09 (×7): qty 1

## 2017-12-09 MED ORDER — SODIUM CHLORIDE 0.9 % IV BOLUS (SEPSIS)
500.0000 mL | Freq: Once | INTRAVENOUS | Status: AC
  Administered 2017-12-09: 500 mL via INTRAVENOUS

## 2017-12-09 MED ORDER — IOPAMIDOL (ISOVUE-300) INJECTION 61%
INTRAVENOUS | Status: AC
  Administered 2017-12-09: 30 mL via GASTROSTOMY
  Filled 2017-12-09: qty 50

## 2017-12-09 NOTE — Progress Notes (Signed)
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 Over the past 2-3 days prior to admission. Patient has not been eating and drinking much. According to his caregiver, patient has also lost about 15 pounds in the last 2-3 weeks.   Reason for Visit: Acute metabolic encephalopathy  Consultants: None  Procedures: None  Antibiotics: Ceftriaxone changed to ceftazidime on 12/22  Subjective/Interval History: Again asleep this morning.  Not really easily arousable.  Caregiver is at the bedside.    ROS: Unable to do  Objective:  Vital Signs  Vitals:   12/08/17 1820 12/08/17 2121 12/09/17 0210 12/09/17 0600  BP: (!) 90/52 98/65 (!) 93/52 (!) 94/55  Pulse:  99 (!) 106 61  Resp:  20 20 18   Temp:  98.6 F (37 C) 98.3 F (36.8 C) 98.6 F (37 C)  TempSrc:  Axillary Axillary Axillary  SpO2:  98% 98% 100%  Weight:      Height:        Intake/Output Summary (Last 24 hours) at 12/09/2017 1038 Last data filed at 12/09/2017 0600 Gross per 24 hour  Intake 722.08 ml  Output 1680 ml  Net -957.92 ml   Filed Weights   12/07/17 1200 12/08/17 0500  Weight: 75.3 kg (166 lb 0.1 oz) 77.4 kg (170 lb 10.2 oz)    General appearance: Fast asleep today.  Does not appear to be in any distress. Resp: Lungs remain clear to auscultation bilaterally. Cardio: S1-S2 is normal regular GI: Abdomen remains soft.  Nontender nondistended.   Bowel sounds are present.  No masses or organomegaly.   Extremities: No edema Neurologic: Patient has old right hemiparesis.  Lab Results:  Data Reviewed: I have personally reviewed following labs and imaging studies  CBC: Recent Labs  Lab 12/04/17 2106 12/06/17 0735 12/07/17 0356 12/08/17 0644 12/09/17 0451  WBC 9.5 9.2 8.3 8.4 7.7  NEUTROABS  --  6.1  --   --   --   HGB 12.4* 11.8* 11.6* 11.9* 11.0*  HCT 38.9* 38.6* 37.0* 37.6* 34.7*  MCV 98.7 101.0* 98.7 97.2 96.7  PLT 255 243 250 197 811    Basic Metabolic Panel: Recent Labs  Lab 12/06/17 0735 12/06/17 1737 12/07/17 0356 12/08/17 0644 12/09/17 0451  NA 154* 152* 150* 141 141  K 4.0 3.7 3.5 3.3* 3.6  CL >130* 127* 124* 114* 111  CO2 19* 19* 20* 19* 21*  GLUCOSE 173* 130* 121* 140* 196*  BUN 53* 51* 47* 41* 39*  CREATININE 1.66* 1.56* 1.46* 1.12 1.10  CALCIUM 8.8* 8.7* 8.8* 8.3* 8.6*    GFR: Estimated Creatinine Clearance: 56.7 mL/min (by C-G formula based on SCr of 1.1 mg/dL).  Liver Function Tests: Recent Labs  Lab 12/04/17 2106 12/05/17 0459 12/06/17 0735 12/07/17 0356 12/08/17 0644  AST 442* 251* 306* 200* 302*  ALT 348* 269* 294* 240* 258*  ALKPHOS 270* 229* 253* 244* 245*  BILITOT 0.7 0.8 0.7 0.8 0.9  PROT 8.5* 7.1 7.6 7.3 6.4*  ALBUMIN 2.6* 2.4* 2.5* 2.3* 2.3*    Recent Labs  Lab 12/04/17 2106  LIPASE 22   Recent Labs  Lab 12/04/17 2254  AMMONIA 16    Coagulation Profile: Recent Labs  Lab 12/04/17 2304  INR 1.24    Cardiac Enzymes: Recent Labs  Lab 12/04/17 2055 12/05/17 0459  TROPONINI 0.03* <0.03    CBG: Recent Labs  Lab 12/08/17 1148 12/08/17 1721 12/08/17 2018 12/09/17 0013 12/09/17 0405  GLUCAP 213* 170* 134* 194* 189*     Recent Results (from the past 240 hour(s))  Urine culture     Status: Abnormal   Collection Time: 12/04/17 10:59 PM  Result Value Ref Range Status   Specimen Description URINE, RANDOM  Final   Special Requests NONE  Final    Culture >=100,000 COLONIES/mL PSEUDOMONAS AERUGINOSA (A)  Final   Report Status 12/07/2017 FINAL  Final   Organism ID, Bacteria PSEUDOMONAS AERUGINOSA (A)  Final      Susceptibility   Pseudomonas aeruginosa - MIC*    CEFTAZIDIME 4 SENSITIVE Sensitive     CIPROFLOXACIN <=0.25 SENSITIVE Sensitive     GENTAMICIN <=1 SENSITIVE Sensitive     IMIPENEM 1 SENSITIVE Sensitive     PIP/TAZO 16 SENSITIVE Sensitive     CEFEPIME 2 SENSITIVE Sensitive     * >=100,000 COLONIES/mL PSEUDOMONAS AERUGINOSA  MRSA PCR Screening     Status: Abnormal   Collection Time: 12/05/17  3:07 AM  Result Value Ref Range Status   MRSA by PCR POSITIVE (A) NEGATIVE Final    Comment:        The GeneXpert MRSA Assay (FDA approved for NASAL specimens only), is one component of a comprehensive MRSA colonization surveillance program. It is not intended to diagnose MRSA infection nor to guide or monitor treatment for MRSA infections. RESULT CALLED TO, READ BACK BY AND VERIFIED WITH: [place] RN AT [phone] 12/05/17 BY A.DAVIS       Radiology Studies: No results found.   Medications:  Scheduled: . Chlorhexidine Gluconate Cloth  6 each Topical Q0600  . docusate sodium  100 mg Oral BID  . famotidine  20 mg Per Tube Daily  . feeding supplement (ENSURE ENLIVE)  1 Bottle Per Tube TID BM  . feeding supplement (PRO-STAT SUGAR FREE 64)  30 mL Per Tube BID  . free water  400 mL Per Tube Q4H  . insulin aspart  0-9 Units Subcutaneous Q4H  . loratadine  10 mg Per Tube Daily  . metoCLOPramide  5 mg Per Tube TID AC  . metoprolol tartrate  25 mg Per Tube BID  . mupirocin ointment  1 application Nasal BID  . polyethylene glycol  17 g Per Tube BID  . potassium chloride  20 mEq Per Tube Daily  . senna-docusate  1 tablet Per Tube QHS   Continuous: . cefTAZidime (FORTAZ)  IV Stopped (12/08/17 2310)  . dextrose 10 mL/hr at 12/09/17 2595  . sodium chloride     GLO:VFIEPPIRJJOAC **OR** acetaminophen, bisacodyl,  HYDROcodone-acetaminophen, ondansetron **OR** ondansetron (ZOFRAN) IV, sodium phosphate  Assessment/Plan:  Principal Problem:   Acute lower UTI Active Problems:   Coronary artery disease due to lipid rich plaque s/p CABG 1996   Chronic atrial fibrillation (HCC)   History of subdural hematoma   Type 2 diabetes mellitus with diabetic neuropathy, without long-term current use of insulin (HCC)   Essential hypertension   Cerebrovascular accident (CVA) due to embolism of left middle cerebral artery (HCC)   Hypernatremia   AKI (acute kidney injury) ([place])   Acute encephalopathy   Subcapsular hematoma  of liver   UTI (urinary tract infection)   Abnormal transaminases   Transaminitis    Acute metabolic encephalopathy. Likely multifactorial including hyponatremia, renal failure, UTI. CT head did not show any new findings. Constipation could have also contributed.  Electrolyte abnormalities have improved significantly.  Mental status has been improving.  He is forced to sleep this morning.  Oral intake remains poor per family.  If despite improvement with metabolic derangements and infection patient does not get back to his baseline we may have to consider more palliative approach.  This was discussed with the family member at bedside.      Urinary tract infection with Pseudomonas Urine culture positive for Pseudomonas.  Sensitivities reviewed.  Patient was initially on ceftriaxone and was changed over to ceftazidime.  Will consider changing to ciprofloxacin when he shows some more improvement in mental status.   Acute kidney injury with hypernatremia This is most likely secondary to poor oral intake over the last many days. Patient does live in a skilled nursing facility. He does have a PEG tube although normally eats by mouth.  Patient was given IV fluids initially in the form of normal saline and then D5 infusion.  Renal function back to baseline.  Monitor urine output.  Potassium level has  improved as well.  Subcapsular liver hematoma with elevated LFTs. Hemodynamically stable.  LFTs remain elevated but stable.  The hematoma is small and age is uncertain.  Hepatitis panel is negative.  Monitor LFTs periodically.  Hypotension in the setting of essential hypertension Blood pressure and has been noted to be low over the past 24 hours.  His antihypertensive medication doses were adjusted.  Metoprolol and propranolol.  Propranolol was discontinued.  The dose of metoprolol was reduced.  Will discontinue amlodipine today.  Blood pressure remains borderline low but better.  Continue to monitor for now.  Heart rate is normal.  Chronic atrial fibrillation Rate is controlled.  He is not on anticoagulation due to history of hemorrhagic stroke, subdural hematoma. Continue metoprolol.  History of diabetes mellitus type 2. HbA1c was 6.8 in July. SSI. Holding his oral agents.  Well controlled.  Constipation. Rectal stool noted on CT scan. He was disimpacted in the emergency department. Needs good bowel regimen on discharge.  Still has evidence for some impaction and constipation.  Enemas will be ordered.  Increased dose of MiraLAX.  History of stroke. He's had a large left MCA stroke resulting in right hemiparesis. It appears that he had significant dysphagia as he still has a PEG tube. Although this is not being used for feeding purposes anymore. Continue to monitor for now.  Macrocytic anemia. Hemoglobin is stable.  Vitamin B12 level 430.  Folate 9.8.  Ferritin 849.  DVT Prophylaxis: SCDs    Code Status: DNR  Family Communication: Discussed with caregiver who was at the bedside. Disposition Plan: Management as outlined above.  He will go to skilled nursing facility when improved.  Wait for further improvement.     LOS: 4 days   [place] [place] [facility] Pager [phone] 12/09/2017, 10:38 AM  If 7PM-7AM, please contact night-coverage at [email or web address], password [facility]

## 2017-12-09 NOTE — Progress Notes (Signed)
G tube dislodged. PEG placed 07/12/2017. Able to get foley back in place. Contrast Xr shows foley in good position -ok to use foley for tube feeds

## 2017-12-09 NOTE — Progress Notes (Signed)
Patient accidentally pulled out his peg tube while being changed and turned. Nurse notified MD, orders received to insert a foley to keep stoma opened. Foley inserted, pt tolerated well, X-ray for placement completed. MD/surgical team will follow/up.

## 2017-12-10 DIAGNOSIS — K9423 Gastrostomy malfunction: Secondary | ICD-10-CM

## 2017-12-10 LAB — GLUCOSE, CAPILLARY
GLUCOSE-CAPILLARY: 139 mg/dL — AB (ref 65–99)
GLUCOSE-CAPILLARY: 165 mg/dL — AB (ref 65–99)
GLUCOSE-CAPILLARY: 177 mg/dL — AB (ref 65–99)
Glucose-Capillary: 142 mg/dL — ABNORMAL HIGH (ref 65–99)
Glucose-Capillary: 130 mg/dL — ABNORMAL HIGH (ref 65–99)
Glucose-Capillary: 158 mg/dL — ABNORMAL HIGH (ref 65–99)

## 2017-12-10 MED ORDER — FLEET ENEMA 7-19 GM/118ML RE ENEM
1.0000 | ENEMA | Freq: Once | RECTAL | Status: AC
  Administered 2017-12-10: 1 via RECTAL
  Filled 2017-12-10: qty 1

## 2017-12-10 MED ORDER — CIPROFLOXACIN HCL 500 MG PO TABS
500.0000 mg | ORAL_TABLET | Freq: Two times a day (BID) | ORAL | Status: DC
  Administered 2017-12-10 – 2017-12-12 (×4): 500 mg via ORAL
  Filled 2017-12-10 (×5): qty 1

## 2017-12-10 MED ORDER — CIPROFLOXACIN IN D5W 400 MG/200ML IV SOLN: 400.0000 mg | Freq: Once | INTRAVENOUS | Status: DC

## 2017-12-10 NOTE — Evaluation (Signed)
Clinical/Bedside Swallow Evaluation Patient Details  Name: Robert Keith MRN: [id number] Date of Birth: 11-Jun-1933  Today's Date: 12/10/2017 Time: SLP Start Time (ACUTE ONLY): 42 SLP Stop Time (ACUTE ONLY): 1600 SLP Time Calculation (min) (ACUTE ONLY): 36 min  Past Medical History:  Past Medical History:  Diagnosis Date  . Anemia   . Cancer [facility])    prostate  . Chronic atrial fibrillation ([place])   . Coronary artery disease    a. s/p CABG [facility] 1996.  . Diabetes mellitus without complication ([place])   . Essential tremor   . GI bleed 09/2016  . H. pylori infection   . H/O craniotomy   . Hypertension   . Intracranial hemorrhage ([place])   . Prostate cancer ([place])   . RBBB   . SDH (subdural hematoma) ([place])   . Stroke [facility])    Past Surgical History:  Past Surgical History:  Procedure Laterality Date  . brain surgery for bleed    . cardiac bypass    . CORONARY ARTERY BYPASS GRAFT    . ESOPHAGOGASTRODUODENOSCOPY N/A 07/12/2017   Procedure: ESOPHAGOGASTRODUODENOSCOPY (EGD);  Surgeon: [name], MD;  Location: [place];  Service: General;  Laterality: N/A;  . ESOPHAGOGASTRODUODENOSCOPY (EGD) WITH PROPOFOL N/A 09/18/2016   Procedure: ESOPHAGOGASTRODUODENOSCOPY (EGD) WITH PROPOFOL;  Surgeon: [name], MD;  Location: [place] [facility];  Service: Endoscopy;  Laterality: N/A;  . FEMORAL ARTERY EXPLORATION Right 06/27/2017   Procedure: FEMORAL ARTERY EXPLORATION;  Surgeon: Conrad Giltner, MD;  Location: [place];  Service: Vascular;  Laterality: Right;  . IR PERCUTANEOUS ART THROMBECTOMY/INFUSION INTRACRANIAL INC DIAG ANGIO  06/26/2017  . PEG PLACEMENT N/A 07/12/2017   Procedure: PERCUTANEOUS ENDOSCOPIC GASTROSTOMY (PEG) PLACEMENT;  Surgeon: [name], MD;  Location: [place];  Service: General;  Laterality: N/A;  . RADIOLOGY WITH ANESTHESIA N/A 06/26/2017   Procedure: RADIOLOGY WITH ANESTHESIA CODE STROKE;  Surgeon: [name], MD;  Location: [place];  Service: Radiology;   Laterality: N/A;   HPI:  83 yo male adm to [facility] with decreased po intake, hallucinations, AMS from SNF.  Pt with acute lower UTI.  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  Swallow eval ordered. Pt on dys3/thin currently.     Assessment / Plan / Recommendation Clinical Impression  Suspect chronic oral dysphagia/motor planning deficits due to pt's prior right MCA CVA.  Suspected delayed oral transiting with impaired ability for pt to masticate solids noted.  Use of puree however effectively cleared oral solid residuals. Pt also wincing some during intake - ? gustatory change with his CVA.  No indications of airway compromise with po provided and pt recent CXR was negative.  Recommend continue diet with strict precautions.  SLP will follow up to assure tolerance and for pt/family education.  Advsied RN/nurse tech to findings and importance of following solids with puree to aid oral clearance.    SLP Visit Diagnosis: Dysphagia, oral phase (R13.11)    Aspiration Risk  Mild aspiration risk    Diet Recommendation Dysphagia 3 (Mech soft);Thin liquid   Liquid Administration via: Cup;Straw Medication Administration: Whole meds with puree Supervision: Full supervision/cueing for compensatory strategies;Staff to assist with self feeding Compensations: Minimize environmental distractions;Slow rate;Small sips/bites;Other (Comment)(follow solid with puree, check for oral residuals) Postural Changes: Remain upright for at least 30 minutes after po intake;Seated upright at 90 degrees    Other  Recommendations Oral Care Recommendations: Oral  care prior to ice chip/H20   Follow up Recommendations        Frequency and Duration min 2x/week  1 week       Prognosis Prognosis for Safe Diet Advancement: Fair Barriers to Reach Goals: Time post onset      Swallow Study   General Date of Onset:  12/10/17 HPI: 83 yo male adm to [facility] with decreased po intake, hallucinations, AMS from SNF.  Pt with acute lower UTI.  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  Swallow eval ordered. Pt on dys3/thin currently.   Type of Study: Bedside Swallow Evaluation Diet Prior to this Study: Dysphagia 3 (soft);Thin liquids Temperature Spikes Noted: No Respiratory Status: Room air History of Recent Intubation: No Behavior/Cognition: Alert;Doesn't follow directions;Other (Comment)(pt follows some directions, has aphasia and suspect motor planning deficits) Oral Cavity Assessment: Dry Oral Care Completed by SLP: No Oral Cavity - Dentition: Adequate natural dentition Vision: Impaired for self-feeding Self-Feeding Abilities: Total assist Patient Positioning: Upright in bed Baseline Vocal Quality: Low vocal intensity;Breathy Volitional Cough: Weak Volitional Swallow: Unable to elicit    Oral/Motor/Sensory Function Overall Oral Motor/Sensory Function: Generalized oral weakness(difficulty following some directions, pt with mild lingual deviation to right upon protrusion, right facial asymmetry) Facial ROM: Reduced right Facial Symmetry: Abnormal symmetry right Facial Strength: Reduced right Lingual ROM: Reduced right Lingual Symmetry: Abnormal symmetry right Lingual Strength: Reduced Velum: Other (comment)(did not view with phonation)   Ice Chips Ice chips: Not tested   Thin Liquid Thin Liquid: Within functional limits Presentation: Straw    Nectar Thick Nectar Thick Liquid: Not tested   Honey Thick Honey Thick Liquid: Not tested   Puree Puree: Impaired Presentation: Spoon Oral Phase Impairments: Reduced lingual movement/coordination Oral Phase Functional Implications: Prolonged oral transit Other Comments: suspect prolonged oral transit but no indications of airway compromise   Solid   GO    Solid: Impaired Oral Phase Impairments: Reduced lingual movement/coordination;Impaired mastication Oral Phase Functional Implications: Oral residue;Other (comment);Prolonged oral transit(mid tongue residuals - clearing with puree bolus)        Macario Golds 12/10/2017,4:22 PM  Luanna Salk, [facility] SLP [phone]

## 2017-12-10 NOTE — Plan of Care (Addendum)
Message sent to Traid MD on call.   3w 28 Patient has visible blood coming from penis, condom cath intact and no visible trauma.  Not receiving any anticoags.  Spoke with [facility] and will continue to monitor.    Hg in 11s, Hx of microscopic hematuria.

## 2017-12-10 NOTE — Progress Notes (Signed)
[facility] PROGRESS NOTE  Robert Keith ASN:[id number] DOB: [birth year] DOA: 12/04/2017  PCP: Leonel Ramsay, MD  Brief History/Interval Summary: 83 year old Caucasian male with a past medical history of prostate cancer status post resection, currently, artery disease status post CABG in the past, hypertension, diabetes type 2, chronic subdural hematoma, previous history of large left MCA stroke with right-sided weakness, history of intracranial hemorrhage, chronic atrial fibrillation, not on anticoagulation, presented from a skilled nursing facility due to worsening confusion, hallucinations and poor appetite. Over the past 2-3 days prior to admission. Patient has not been eating and drinking much. According to his caregiver, patient has also lost about 15 pounds in the last 2-3 weeks.  Patient was started on antibiotics.  Urine culture positive for Pseudomonas.  Mental status slowly improved.  Patient then pulled out his PEG tube yesterday.  Reason for Visit: Acute metabolic encephalopathy  Consultants: None  Procedures: None  Antibiotics: Ceftriaxone changed to ceftazidime on 12/22 To be transitioned to oral ciprofloxacin today.  Subjective/Interval History: Patient awake alert today.  In no distress.  Pleasantly confused.  Caregiver is at the bedside.    Objective:  Vital Signs  Vitals:   12/09/17 2128 12/10/17 0115 12/10/17 0422 12/10/17 1022  BP: 114/61 (!) 95/48 112/63 108/78  Pulse: (!) 109 81 (!) 57 (!) 114  Resp: 18 18 18 18   Temp: 97.9 F (36.6 C) 97.9 F (36.6 C) 97.8 F (36.6 C) 97.7 F (36.5 C)  TempSrc: Oral Oral Oral Oral  SpO2: 97% 92% 99% 97%  Weight:   79.6 kg (175 lb 7.8 oz)   Height:        Intake/Output Summary (Last 24 hours) at 12/10/2017 1033 Last data filed at 12/10/2017 0300 Gross per 24 hour  Intake 270.83 ml  Output 500 ml  Net -229.17 ml   Filed Weights   12/07/17 1200 12/08/17 0500 12/10/17 0422  Weight: 75.3 kg (166 lb 0.1  oz) 77.4 kg (170 lb 10.2 oz) 79.6 kg (175 lb 7.8 oz)    General appearance: Awake alert.  In no distress. Resp: Clear to auscultation bilaterally. Cardio: S1-S2 is normal regular GI: Abdomen is soft.  Foley catheter is noted which was placed after he put out his PEG tube.  Nontender nondistended.  Bowel sounds present. Extremities: No edema Neurologic: Patient has old right hemiparesis.  Lab Results:  Data Reviewed: I have personally reviewed following labs and imaging studies  CBC: Recent Labs  Lab 12/04/17 2106 12/06/17 0735 12/07/17 0356 12/08/17 0644 12/09/17 0451  WBC 9.5 9.2 8.3 8.4 7.7  NEUTROABS  --  6.1  --   --   --   HGB 12.4* 11.8* 11.6* 11.9* 11.0*  HCT 38.9* 38.6* 37.0* 37.6* 34.7*  MCV 98.7 101.0* 98.7 97.2 96.7  PLT 255 243 250 197 193    Basic Metabolic Panel: Recent Labs  Lab 12/06/17 0735 12/06/17 1737 12/07/17 0356 12/08/17 0644 12/09/17 0451  NA 154* 152* 150* 141 141  K 4.0 3.7 3.5 3.3* 3.6  CL >130* 127* 124* 114* 111  CO2 19* 19* 20* 19* 21*  GLUCOSE 173* 130* 121* 140* 196*  BUN 53* 51* 47* 41* 39*  CREATININE 1.66* 1.56* 1.46* 1.12 1.10  CALCIUM 8.8* 8.7* 8.8* 8.3* 8.6*    GFR: Estimated Creatinine Clearance: 56.8 mL/min (by C-G formula based on SCr of 1.1 mg/dL).  Liver Function Tests: Recent Labs  Lab 12/04/17 2106 12/05/17 0459 12/06/17 0735 12/07/17 0356 12/08/17 0644  AST 442*  251* 306* 200* 302*  ALT 348* 269* 294* 240* 258*  ALKPHOS 270* 229* 253* 244* 245*  BILITOT 0.7 0.8 0.7 0.8 0.9  PROT 8.5* 7.1 7.6 7.3 6.4*  ALBUMIN 2.6* 2.4* 2.5* 2.3* 2.3*    Recent Labs  Lab 12/04/17 2106  LIPASE 22   Recent Labs  Lab 12/04/17 2254  AMMONIA 16    Coagulation Profile: Recent Labs  Lab 12/04/17 2304  INR 1.24    Cardiac Enzymes: Recent Labs  Lab 12/04/17 2055 12/05/17 0459  TROPONINI 0.03* <0.03    CBG: Recent Labs  Lab 12/09/17 0405 12/09/17 1720 12/09/17 2353 12/10/17 0419 12/10/17 0813    GLUCAP 189* 138* 161* 165* 142*     Recent Results (from the past 240 hour(s))  Urine culture     Status: Abnormal   Collection Time: 12/04/17 10:59 PM  Result Value Ref Range Status   Specimen Description URINE, RANDOM  Final   Special Requests NONE  Final   Culture >=100,000 COLONIES/mL PSEUDOMONAS AERUGINOSA (A)  Final   Report Status 12/07/2017 FINAL  Final   Organism ID, Bacteria PSEUDOMONAS AERUGINOSA (A)  Final      Susceptibility   Pseudomonas aeruginosa - MIC*    CEFTAZIDIME 4 SENSITIVE Sensitive     CIPROFLOXACIN <=0.25 SENSITIVE Sensitive     GENTAMICIN <=1 SENSITIVE Sensitive     IMIPENEM 1 SENSITIVE Sensitive     PIP/TAZO 16 SENSITIVE Sensitive     CEFEPIME 2 SENSITIVE Sensitive     * >=100,000 COLONIES/mL PSEUDOMONAS AERUGINOSA  MRSA PCR Screening     Status: Abnormal   Collection Time: 12/05/17  3:07 AM  Result Value Ref Range Status   MRSA by PCR POSITIVE (A) NEGATIVE Final    Comment:        The GeneXpert MRSA Assay (FDA approved for NASAL specimens only), is one component of a comprehensive MRSA colonization surveillance program. It is not intended to diagnose MRSA infection nor to guide or monitor treatment for MRSA infections. RESULT CALLED TO, READ BACK BY AND VERIFIED WITH: Walker RN AT 4287 12/05/17 BY A.DAVIS       Radiology Studies: Dg Abdomen Peg Tube Location  Result Date: 12/09/2017 CLINICAL DATA:  83 year old who inadvertently removed indwelling feeding gastrostomy tube. A Foley catheter was placed into the tract and was injected with contrast to confirm gastric positioning. EXAM: ABDOMEN - 1 VIEW COMPARISON:  CT abdomen pelvis 12/04/2017 and earlier. Abdominal x-ray 06/28/2017. FINDINGS: The Foley catheter with its inflated balloon is appropriately position within the body of the stomach. The contrast material is present within the decompressed stomach. There is no evidence of contrast extravasation. Bowel gas pattern unremarkable  without evidence of obstruction or significant ileus. Moderate stool burden in the rectum with expected colonic stool burden elsewhere throughout the colon. IMPRESSION: 1. Foley catheter tip appropriately position in the body of the stomach. Contrast is present within the decompressed stomach. No evidence of contrast extravasation. 2. No acute abdominal abnormality. Moderate rectal colonic stool burden with expected stool burden elsewhere throughout the colon. Electronically Signed   By: Evangeline Dakin M.D.   On: 12/09/2017 16:45     Medications:  Scheduled: . Chlorhexidine Gluconate Cloth  6 each Topical Q0600  . docusate sodium  100 mg Oral BID  . famotidine  20 mg Per Tube Daily  . feeding supplement (ENSURE ENLIVE)  1 Bottle Per Tube TID BM  . feeding supplement (PRO-STAT SUGAR FREE 64)  30 mL Per Tube BID  .  free water  400 mL Per Tube Q4H  . insulin aspart  0-9 Units Subcutaneous Q4H  . loratadine  10 mg Per Tube Daily  . metoCLOPramide  5 mg Per Tube TID AC  . metoprolol tartrate  25 mg Per Tube BID  . mupirocin ointment  1 application Nasal BID  . polyethylene glycol  17 g Per Tube BID  . potassium chloride  20 mEq Per Tube Daily  . senna-docusate  1 tablet Per Tube QHS   Continuous: . dextrose 10 mL/hr at 12/09/17 7564   PPI:[id number] **OR** acetaminophen, bisacodyl, HYDROcodone-acetaminophen, ondansetron **OR** ondansetron (ZOFRAN) IV, sodium phosphate  Assessment/Plan:  Principal Problem:   Acute lower UTI Active Problems:   Coronary artery disease due to lipid rich plaque s/p CABG 1996   Chronic atrial fibrillation (HCC)   History of subdural hematoma   Type 2 diabetes mellitus with diabetic neuropathy, without long-term current use of insulin (HCC)   Essential hypertension   Cerebrovascular accident (CVA) due to embolism of left middle cerebral artery (HCC)   Hypernatremia   AKI (acute kidney injury) ([place])   Acute encephalopathy   Subcapsular hematoma of  liver   UTI (urinary tract infection)   Abnormal transaminases   Transaminitis    Acute metabolic encephalopathy. Likely multifactorial including hyponatremia, renal failure, UTI. CT head did not show any new findings. Constipation could have also contributed.  Electrolyte abnormalities have improved significantly.  Mental status has improved.  He could be close to his baseline.  He did eat his breakfast this morning.  Had all his medications by mouth this morning per nursing staff.  Continue current management.  Would recommend palliative medicine consult at skilled nursing facility.     Urinary tract infection with Pseudomonas Urine culture positive for Pseudomonas.  Sensitivities reviewed.  Patient was initially on ceftriaxone and was changed over to ceftazidime.  Change to oral ciprofloxacin today considering improvement in mental status and improved oral intake.    Acute kidney injury with hypernatremia Acute renal failure was most likely secondary to poor oral intake as well as active infection.  Renal function and sodium level have improved with IV fluids.  Back to baseline.    Subcapsular liver hematoma with elevated LFTs. Hemodynamically stable.  LFTs remain elevated but stable.  The hematoma is small and age is uncertain.  Hepatitis panel is negative.  Monitor LFTs periodically.  Hypotension in the setting of essential hypertension Blood pressure has been noted to be low over the last 2-3 days.  He has been asymptomatic.  His antihypertensive medication doses were adjusted.  He was noted to be on both metoprolol and propranolol.  Propranolol was discontinued. The dose of metoprolol was reduced.  Amlodipine was also discontinued.  Blood pressure has improved.  Continue to monitor.    Chronic atrial fibrillation Rate is controlled.  He is not on anticoagulation due to history of hemorrhagic stroke, subdural hematoma. Continue metoprolol.  History of diabetes mellitus type 2. HbA1c  was 6.8 in July. SSI. Holding his oral agents.  Well controlled.  Constipation. Rectal stool noted on CT scan. He was disimpacted in the emergency department. Needs good bowel regimen on discharge.  Increased dose of MiraLAX.  Enema as needed.  History of stroke/dysphagia He's had a large left MCA stroke resulting in right hemiparesis. It appears that he had significant dysphagia as he still has a PEG tube. Although this is not being used for feeding purposes anymore.  Unfortunately patient pulled out his PEG  tube yesterday.  He has never done this before.  Foley catheter was immediately placed.  General surgery consulted to replace the tube.  Continue to monitor for now.  Macrocytic anemia. Hemoglobin is stable.  Vitamin B12 level 430.  Folate 9.8.  Ferritin 849.  DVT Prophylaxis: SCDs    Code Status: DNR  Family Communication: Discussed with his caregiver. Disposition Plan: Admit as outlined above.  Change to oral antibiotics today.  Wait for replacement PEG tube.  Hopefully he can go back to his skilled nursing facility in 1-2 days.     LOS: 5 days   [facility] Pager [phone] 12/10/2017, 10:33 AM  If 7PM-7AM, please contact night-coverage at [email or web address], password [facility]

## 2017-12-10 NOTE — Progress Notes (Signed)
Patient given enema with positive results

## 2017-12-10 NOTE — Progress Notes (Signed)
Pharmacy Antibiotic Note  Robert Keith is a 83 y.o. male admitted on 12/04/2017 with a  UTI.  He presented with increased confusion and a fever. His urine culture grew pseudomonas that is pan-sensitive. Today (12/25) he was transitioned from IV ceftazidime to PO ciprofloxacin. WBC wnl, afebrile, and renal function has improved since admit.  Plan: Ciprofloxacin 500mg  PO Q12hrs Determine LOT, monitor renal function  Temp (24hrs), Avg:98 F (36.7 C), Min:97.7 F (36.5 C), Max:98.9 F (37.2 C)  Recent Labs  Lab 12/04/17 2106  12/06/17 0735 12/06/17 1737 12/07/17 0356 12/08/17 0644 12/09/17 0451  WBC 9.5  --  9.2  --  8.3 8.4 7.7  CREATININE 1.50*   < > 1.66* 1.56* 1.46* 1.12 1.10   < > = values in this interval not displayed.    Estimated Creatinine Clearance: 56.8 mL/min (by C-G formula based on SCr of 1.1 mg/dL).    Antimicrobials this admission: Ceftriaxone 12/20>>12/22 Ceftazidime 12/22>>12/25 Ciprofloxacin 12/25>>   Microbiology results: 12/19 UCx: Pseudomonas (pan-sensitive) 12/20  MRSA PCR: positive   Patterson Hammersmith PharmD PGY1 Pharmacy Practice Resident 12/10/2017 10:53 AM Pager: [phone] Phone: [phone]

## 2017-12-11 LAB — GLUCOSE, CAPILLARY
GLUCOSE-CAPILLARY: 152 mg/dL — AB (ref 65–99)
GLUCOSE-CAPILLARY: 162 mg/dL — AB (ref 65–99)
GLUCOSE-CAPILLARY: 111 mg/dL — AB (ref 65–99)
Glucose-Capillary: 148 mg/dL — ABNORMAL HIGH (ref 65–99)
Glucose-Capillary: 162 mg/dL — ABNORMAL HIGH (ref 65–99)
Glucose-Capillary: 133 mg/dL — ABNORMAL HIGH (ref 65–99)

## 2017-12-11 LAB — BASIC METABOLIC PANEL
Anion gap: 9 (ref 5–15)
Anion gap: 4 — ABNORMAL LOW (ref 5–15)
BUN: 28 mg/dL — AB (ref 6–20)
BUN: 28 mg/dL — AB (ref 6–20)
CHLORIDE: 119 mmol/L — AB (ref 101–111)
CO2: 23 mmol/L (ref 22–32)
CO2: 25 mmol/L (ref 22–32)
Calcium: 9.1 mg/dL (ref 8.9–10.3)
Calcium: 8.7 mg/dL — ABNORMAL LOW (ref 8.9–10.3)
Chloride: 120 mmol/L — ABNORMAL HIGH (ref 101–111)
Creatinine, Ser: 1.14 mg/dL (ref 0.61–1.24)
Creatinine, Ser: 1.1 mg/dL (ref 0.61–1.24)
GFR calc Af Amer: >60 mL/min (ref >60)
GFR calc Af Amer: >60 mL/min (ref >60)
GFR calc non Af Amer: >60 mL/min (ref >60)
GFR, EST NON AFRICAN AMERICAN: 58 mL/min — AB (ref >60)
GLUCOSE: 179 mg/dL — AB (ref 65–99)
GLUCOSE: 144 mg/dL — AB (ref 65–99)
POTASSIUM: 3.9 mmol/L (ref 3.5–5.1)
POTASSIUM: 3.9 mmol/L (ref 3.5–5.1)
Sodium: 152 mmol/L — ABNORMAL HIGH (ref 135–145)
Sodium: 148 mmol/L — ABNORMAL HIGH (ref 135–145)

## 2017-12-11 LAB — CBC
HCT: 37.1 % — ABNORMAL LOW (ref 39.0–52.0)
HEMOGLOBIN: 11.4 g/dL — AB (ref 13.0–17.0)
MCH: 30.7 pg (ref 26.0–34.0)
MCHC: 30.7 g/dL (ref 30.0–36.0)
MCV: 100 fL (ref 78.0–100.0)
Platelets: 244 10*3/uL (ref 150–400)
RBC: 3.71 MIL/uL — AB (ref 4.22–5.81)
RDW: 15.4 % (ref 11.5–15.5)
WBC: 7.1 10*3/uL (ref 4.0–10.5)

## 2017-12-11 MED ORDER — FREE WATER: 600.0000 mL | 0 refills | Status: DC

## 2017-12-11 MED ORDER — BISACODYL 10 MG RE SUPP: 10.0000 mg | Freq: Every day | RECTAL | 0 refills | Status: AC | PRN

## 2017-12-11 MED ORDER — FREE WATER
600.0000 mL | Status: DC
  Administered 2017-12-11 – 2017-12-12 (×6): 600 mL

## 2017-12-11 MED ORDER — METOPROLOL TARTRATE 100 MG PO TABS: 50.0000 mg | ORAL_TABLET | Freq: Two times a day (BID) | ORAL | Status: AC

## 2017-12-11 MED ORDER — FLEET ENEMA 7-19 GM/118ML RE ENEM: 1.0000 | ENEMA | Freq: Once | RECTAL | 0 refills | Status: AC | PRN

## 2017-12-11 MED ORDER — POLYETHYLENE GLYCOL 3350 17 G PO PACK: 17.0000 g | PACK | Freq: Two times a day (BID) | ORAL | 0 refills | Status: AC

## 2017-12-11 MED ORDER — DOCUSATE SODIUM 100 MG PO CAPS: 100.0000 mg | ORAL_CAPSULE | Freq: Two times a day (BID) | ORAL | 0 refills | Status: AC

## 2017-12-11 NOTE — Social Work (Signed)
CSW received call from admissions staff at [facility] confirming that patient is a resident in their long term care unit.  CSW will continue to follow as patient will return to nursing facility when ready.  CSW will assist with disposition.  Elissa Hefty, LCSW Clinical Social Worker [phone]

## 2017-12-11 NOTE — Progress Notes (Signed)
  Speech Language Pathology Treatment: Dysphagia  Patient Details Name: Robert Keith MRN: [id number] DOB: 1933/05/10 Today's Date: 12/11/2017 Time: 7001-7494 SLP Time Calculation (min) (ACUTE ONLY): 15 min  Assessment / Plan / Recommendation Clinical Impression  SLP visit to assure po tolerance, educate caregiver to recommendations and establish premorbid swallow function.  HCPOA in room with pt and reports pt with oral holding of solids since he has become weak.  He reportedly stopped eating approximately 3 weeks ago and has lost 15 pounds in a short time frame.  HCPOA reports pt has h/o recurrent oral candidiasis - and questions this impact on his po intake.  SLP also questions gustatory impact of candidiasis and stroke.  Today pt NPO for potential replacement of PEG.  Reviewed with pt and HCPOA dysphagia compensation strategies to mitigate aspiration risk including importance of following every bolus of solids with puree to aid oral clearance.  HCPOA reported understanding to information.   Of note, pt asleep upon entrance to room and he appeared to have some apnea.  Upon tactile stimulation, pt did awaken and apnea seized.    HCPOA reports pt has sleep apnea since his CVA and she would hold her hand over his mouth to assure he was breathing in the middle of the night.   Provided HCPOA with cup of water and oral swabs to moisten pt's mouth for comfort given his severe xerostomia from open mouth breathing/sleeping pattern.    Will follow up briefly to assure tolerance of diet and continued education.  Thanks for this consult.    HPI HPI: 83 yo male adm to [facility] with decreased po intake, hallucinations, AMS from SNF.  Pt with acute lower UTI.  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  Swallow eval ordered. Pt on dys3/thin currently.        SLP Plan  Continue with current plan of care        Recommendations  Diet recommendations: Dysphagia 3 (mechanical soft);Thin liquid Medication Administration: Whole meds with puree Supervision: Staff to assist with self feeding Compensations: Minimize environmental distractions;Slow rate;Small sips/bites;Other (Comment)(follow solid with puree, check for oral residuals) Postural Changes and/or Swallow Maneuvers: Seated upright 90 degrees;Upright 30-60 min after meal                Oral Care Recommendations: Oral care prior to ice chip/H20 SLP Visit Diagnosis: Dysphagia, oral phase (R13.11) Plan: Continue with current plan of care       GO                Macario Golds 12/11/2017, 9:07 AM  Luanna Salk, [facility] SLP [phone]

## 2017-12-11 NOTE — Progress Notes (Signed)
Subjective/Chief Complaint: No acute events, using foley g tube without issue   Objective: Vital signs in last 24 hours: Temp:  [97.6 F (36.4 C)-98.9 F (37.2 C)] 98.9 F (37.2 C) (12/26 0400) Pulse Rate:  [52-114] 102 (12/26 0400) Resp:  [18-20] 20 (12/26 0400) BP: (92-131)/(55-80) 92/60 (12/26 0400) SpO2:  [95 %-99 %] 95 % (12/26 0400) Weight:  [78.3 kg (172 lb 9.9 oz)] 78.3 kg (172 lb 9.9 oz) (12/26 0400) Last BM Date: 12/10/17  Intake/Output from previous day: 12/25 0701 - 12/26 0700 In: 400 [NG/GT:400] Out: 1100 [Urine:1100] Intake/Output this shift: No intake/output data recorded.  General appearance: chronically ill, nonverbal Resp: unlabored GI: soft, non-tender; bowel sounds normal; no masses,  no organomegaly, tube site c/d/i, tube capped currently  Lab Results:  Recent Labs    12/09/17 0451 12/11/17 0501  WBC 7.7 7.1  HGB 11.0* 11.4*  HCT 34.7* 37.1*  PLT 247 244   BMET Recent Labs    12/09/17 0451 12/11/17 0501  NA 141 152*  K 3.6 3.9  CL 111 120*  CO2 21* 23  GLUCOSE 196* 179*  BUN 39* 28*  CREATININE 1.10 1.14  CALCIUM 8.6* 9.1   PT/INR No results for input(s): LABPROT, INR in the last 72 hours. ABG No results for input(s): PHART, HCO3 in the last 72 hours.  Invalid input(s): PCO2, PO2  Studies/Results: Dg Abdomen Peg Tube Location  Result Date: 12/09/2017 CLINICAL DATA:  83 year old who inadvertently removed indwelling feeding gastrostomy tube. A Foley catheter was placed into the tract and was injected with contrast to confirm gastric positioning. EXAM: ABDOMEN - 1 VIEW COMPARISON:  CT abdomen pelvis 12/04/2017 and earlier. Abdominal x-ray 06/28/2017. FINDINGS: The Foley catheter with its inflated balloon is appropriately position within the body of the stomach. The contrast material is present within the decompressed stomach. There is no evidence of contrast extravasation. Bowel gas pattern unremarkable without evidence of  obstruction or significant ileus. Moderate stool burden in the rectum with expected colonic stool burden elsewhere throughout the colon. IMPRESSION: 1. Foley catheter tip appropriately position in the body of the stomach. Contrast is present within the decompressed stomach. No evidence of contrast extravasation. 2. No acute abdominal abnormality. Moderate rectal colonic stool burden with expected stool burden elsewhere throughout the colon. Electronically Signed   By: Evangeline Dakin M.D.   On: 12/09/2017 16:45    Anti-infectives: Anti-infectives (From admission, onward)   Start     Dose/Rate Route Frequency Ordered Stop   12/10/17 2215  ciprofloxacin (CIPRO) IVPB 400 mg  Status:  Discontinued    Comments:  In place of tonights po dose. Thanks!   400 mg 200 mL/hr over 60 Minutes Intravenous  Once 12/10/17 2209 12/10/17 2211   12/10/17 2000  ciprofloxacin (CIPRO) tablet 500 mg     500 mg Oral 2 times daily 12/10/17 1045     12/07/17 1300  cefTAZidime (FORTAZ) 1 g in dextrose 5 % 50 mL IVPB  Status:  Discontinued     1 g 100 mL/hr over 30 Minutes Intravenous Every 12 hours 12/07/17 1246 12/10/17 1032   12/07/17 1230  cefTRIAXone (ROCEPHIN) 1 g in dextrose 5 % 50 mL IVPB  Status:  Discontinued     1 g 100 mL/hr over 30 Minutes Intravenous Every 24 hours 12/07/17 1228 12/07/17 1232   12/05/17 2300  cefTRIAXone (ROCEPHIN) 1 g in dextrose 5 % 50 mL IVPB  Status:  Discontinued     1 g 100 mL/hr over  30 Minutes Intravenous Every 24 hours 12/05/17 0348 12/07/17 1227   12/04/17 2315  cefTRIAXone (ROCEPHIN) 1 g in dextrose 5 % 50 mL IVPB     1 g 100 mL/hr over 30 Minutes Intravenous  Once 12/04/17 2312 12/05/17 0159      Assessment/Plan: s/p * No surgery found * S/p bedside replacement of PEG with foley tube. This can be used indefinitely. Can be exchanged in radiology for balloon gastrostomy tube if desired.   Surgery signing off. Please call with questions.   LOS: 6 days    Robert Keith 12/11/2017

## 2017-12-11 NOTE — Progress Notes (Signed)
Initial Nutrition Assessment  DOCUMENTATION CODES:   Severe malnutrition in context of chronic illness  INTERVENTION:  Free water 662mL Q4H per MD Continue Ensure Enlive po TID, each supplement provides 350 kcal and 20 grams of protein Continue Pro-Stat 67mL BID per MD  If patient is to undergoing longterm tubefeeding and not expected to meet his needs, recommend Jevity 1.2 480mL (2 - 221mL bottles TID), Pro-Stat 4mL TID (or appropriate protein modular)  Regimen provides 2010 calories, 124 gm protein, 1117mL free water  Recommend an additional 230mL free water every 6 hours, provides total 2035mL free water  If patient is eating ~50% but not felt to be meeting calorie needs, recommend 272mL Jevity 1.2 BID supplementally, TID if needed.  NUTRITION DIAGNOSIS:   Severe Malnutrition related to chronic illness as evidenced by severe muscle depletion, severe fat depletion, percent weight loss.  GOAL:   Patient will meet greater than or equal to 90% of their needs  MONITOR:   PO intake, I & O's, Labs, Weight trends, Supplement acceptance  REASON FOR ASSESSMENT:   Consult Assessment of nutrition requirement/status  ASSESSMENT:   Robert Keith is an 83 yo male with PMH of Prostate Cancer s/p resection, CAD s/p CABG, HTN, DM, hx large L MCA with considerable residual deficit, hx intracranial hemorrhage presents to ED from SNF with confusion, hallucinations, poor appetite, has not been eating for approximately 2-3 weeks and has lost 15 pounds. Also reports some abdominal pain. Had a PEG placed 07/12/2017 but also eats PO, cleared for NDD3 per speech pathology today.   Spoke with patient and his HCPOA at bedside. Patient arouses briefly for RD but does not say much She reports they haven't used his PEG tube in several months. States they did use it for a few months after his stroke but that he has been eating PO, has not needed it in a while. States recently he hasn't been eating and has lost  15 pounds in 4 weeks (an 8% severe weight loss for timeframe.)  Also of note, patient is bedbound/wheelchar bound, unable to walk. Normally eats well at [facility] but does not give specific history. Discussed with Dr. [place] Pink -> does not want to start tubefeeding during stay. Was eating well Per RN Dan for Lunch, HCPOA was feeding him.   Labs reviewed: Na 152 CBG 148, 162, 139 Elevated LFTs  Medications reviewed and include:  Colace, Insulin, Reglan, Miralax, Senokot-S 20K+ daily Meal Completion: 10-20% D5 at 47mL/hr --> 41 calories   Intake/Output Summary (Last 24 hours) at 12/11/2017 1500 Last data filed at 12/11/2017 1201 Gross per 24 hour  Intake 1600 ml  Output 1100 ml  Net 500 ml    NUTRITION - FOCUSED PHYSICAL EXAM:    Most Recent Value  Orbital Region  Severe depletion  Upper Arm Region  Moderate depletion  Thoracic and Lumbar Region  Moderate depletion  Buccal Region  Severe depletion  Temple Region  Severe depletion  Clavicle Bone Region  Severe depletion  Clavicle and Acromion Bone Region  Severe depletion  Scapular Bone Region  Severe depletion  Dorsal Hand  Severe depletion  Patellar Region  Severe depletion  Anterior Thigh Region  Severe depletion  Posterior Calf Region  Severe depletion  Edema (RD Assessment)  None  Hair  Reviewed  Eyes  Reviewed  Mouth  Reviewed  Skin  Reviewed  Nails  Reviewed       Diet Order:  DIET DYS 3 Room service appropriate? Yes; Fluid consistency: Thin  EDUCATION NEEDS:   Not appropriate for education at this time  Skin:  Skin Assessment: Skin Integrity Issues: Skin Integrity Issues:: Stage I Stage I: Buttocks  Last BM:  12/10/2017 (type 7)  Height:   Ht Readings from Last 1 Encounters:  12/07/17 6' (1.829 m)    Weight:   Wt Readings from Last 1 Encounters:  12/11/17 172 lb 9.9 oz (78.3 kg)    Ideal Body Weight:  80.9 kg  BMI:  Body mass index is 23.41 kg/m.  Estimated Nutritional Needs:   Kcal:   1800-2000 calories (MSJ x1.2-1.3)  Protein:  117-139 grams (1.5-1.7g/kg)  Fluid:  1.8-2L  Satira Anis. Otis Portal, MS, RD LDN Inpatient Clinical Dietitian Pager [phone]

## 2017-12-11 NOTE — Progress Notes (Signed)
[facility] PROGRESS NOTE  Robert Keith YPP:[id number] DOB: Aug 26, 1933 DOA: 12/04/2017  PCP: Leonel Ramsay, MD  Brief History/Interval Summary: 83 year old Caucasian male with a past medical history of prostate cancer status post resection, currently, artery disease status post CABG in the past, hypertension, diabetes type 2, chronic subdural hematoma, previous history of large left MCA stroke with right-sided weakness, history of intracranial hemorrhage, chronic atrial fibrillation, not on anticoagulation, presented from a skilled nursing facility due to worsening confusion, hallucinations and poor appetite. Over the past 2-3 days prior to admission. Patient has not been eating and drinking much. According to his caregiver, patient has also lost about 15 pounds in the last 2-3 weeks.  Patient was started on antibiotics.  Urine culture positive for Pseudomonas.  Mental status slowly improved.  Patient then pulled out his PEG tube yesterday.  Reason for Visit: Acute metabolic encephalopathy  Consultants: None  Procedures: None  Antibiotics: Ceftriaxone changed to ceftazidime on 12/22 Transitioned to oral ciprofloxacin 12/25  Subjective/Interval History: Remains pleasantly confused.   Objective:  Vital Signs  Vitals:   12/11/17 0100 12/11/17 0400 12/11/17 1006 12/11/17 1300  BP: 131/75 92/60 117/65 97/61  Pulse: 100 (!) 102 (!) 116 88  Resp: 20 20 19 19   Temp: 98.8 F (37.1 C) 98.9 F (37.2 C) 98.5 F (36.9 C) 98.6 F (37 C)  TempSrc: Oral Axillary Axillary Axillary  SpO2: 99% 95% 99% 99%  Weight:  78.3 kg (172 lb 9.9 oz)    Height:        Intake/Output Summary (Last 24 hours) at 12/11/2017 1501 Last data filed at 12/11/2017 1201 Gross per 24 hour  Intake 1600 ml  Output 1100 ml  Net 500 ml   Filed Weights   12/08/17 0500 12/10/17 0422 12/11/17 0400  Weight: 77.4 kg (170 lb 10.2 oz) 79.6 kg (175 lb 7.8 oz) 78.3 kg (172 lb 9.9 oz)    General  appearance: Patient awake alert.  In no distress Resp: Clear to auscultation bilaterally Cardio: S1-S2 is normal and regular. GI: Abdomen remains soft.  Foley catheter is noted which was placed after he put out his PEG tube.  Nontender nondistended.  Bowel sounds present. Extremities: No edema Neurologic: Patient has old right hemiparesis.  Lab Results:  Data Reviewed: I have personally reviewed following labs and imaging studies  CBC: Recent Labs  Lab 12/06/17 0735 12/07/17 0356 12/08/17 0644 12/09/17 0451 12/11/17 0501  WBC 9.2 8.3 8.4 7.7 7.1  NEUTROABS 6.1  --   --   --   --   HGB 11.8* 11.6* 11.9* 11.0* 11.4*  HCT 38.6* 37.0* 37.6* 34.7* 37.1*  MCV 101.0* 98.7 97.2 96.7 100.0  PLT 243 250 197 247 458    Basic Metabolic Panel: Recent Labs  Lab 12/06/17 1737 12/07/17 0356 12/08/17 0644 12/09/17 0451 12/11/17 0501  NA 152* 150* 141 141 152*  K 3.7 3.5 3.3* 3.6 3.9  CL 127* 124* 114* 111 120*  CO2 19* 20* 19* 21* 23  GLUCOSE 130* 121* 140* 196* 179*  BUN 51* 47* 41* 39* 28*  CREATININE 1.56* 1.46* 1.12 1.10 1.14  CALCIUM 8.7* 8.8* 8.3* 8.6* 9.1    GFR: Estimated Creatinine Clearance: 54.8 mL/min (by C-G formula based on SCr of 1.14 mg/dL).  Liver Function Tests: Recent Labs  Lab 12/04/17 2106 12/05/17 0459 12/06/17 0735 12/07/17 0356 12/08/17 0644  AST 442* 251* 306* 200* 302*  ALT 348* 269* 294* 240* 258*  ALKPHOS 270* 229* 253* 244*  245*  BILITOT 0.7 0.8 0.7 0.8 0.9  PROT 8.5* 7.1 7.6 7.3 6.4*  ALBUMIN 2.6* 2.4* 2.5* 2.3* 2.3*    Recent Labs  Lab 12/04/17 2106  LIPASE 22   Recent Labs  Lab 12/04/17 2254  AMMONIA 16    Coagulation Profile: Recent Labs  Lab 12/04/17 2304  INR 1.24    Cardiac Enzymes: Recent Labs  Lab 12/04/17 2055 12/05/17 0459  TROPONINI 0.03* <0.03    CBG: Recent Labs  Lab 12/10/17 1948 12/11/17 0000 12/11/17 0405 12/11/17 0746 12/11/17 1211  GLUCAP 158* 139* 162* 148* 162*     Recent Results  (from the past 240 hour(s))  Urine culture     Status: Abnormal   Collection Time: 12/04/17 10:59 PM  Result Value Ref Range Status   Specimen Description URINE, RANDOM  Final   Special Requests NONE  Final   Culture >=100,000 COLONIES/mL PSEUDOMONAS AERUGINOSA (A)  Final   Report Status 12/07/2017 FINAL  Final   Organism ID, Bacteria PSEUDOMONAS AERUGINOSA (A)  Final      Susceptibility   Pseudomonas aeruginosa - MIC*    CEFTAZIDIME 4 SENSITIVE Sensitive     CIPROFLOXACIN <=0.25 SENSITIVE Sensitive     GENTAMICIN <=1 SENSITIVE Sensitive     IMIPENEM 1 SENSITIVE Sensitive     PIP/TAZO 16 SENSITIVE Sensitive     CEFEPIME 2 SENSITIVE Sensitive     * >=100,000 COLONIES/mL PSEUDOMONAS AERUGINOSA  MRSA PCR Screening     Status: Abnormal   Collection Time: 12/05/17  3:07 AM  Result Value Ref Range Status   MRSA by PCR POSITIVE (A) NEGATIVE Final    Comment:        The GeneXpert MRSA Assay (FDA approved for NASAL specimens only), is one component of a comprehensive MRSA colonization surveillance program. It is not intended to diagnose MRSA infection nor to guide or monitor treatment for MRSA infections. RESULT CALLED TO, READ BACK BY AND VERIFIED WITH: [place]on RN AT 3329 12/05/17 BY A.DAVIS       Radiology Studies: Dg Abdomen Peg Tube Location  Result Date: 12/09/2017 CLINICAL DATA:  83 year old who inadvertently removed indwelling feeding gastrostomy tube. A Foley catheter was placed into the tract and was injected with contrast to confirm gastric positioning. EXAM: ABDOMEN - 1 VIEW COMPARISON:  CT abdomen pelvis 12/04/2017 and earlier. Abdominal x-ray 06/28/2017. FINDINGS: The Foley catheter with its inflated balloon is appropriately position within the body of the stomach. The contrast material is present within the decompressed stomach. There is no evidence of contrast extravasation. Bowel gas pattern unremarkable without evidence of obstruction or significant ileus. Moderate  stool burden in the rectum with expected colonic stool burden elsewhere throughout the colon. IMPRESSION: 1. Foley catheter tip appropriately position in the body of the stomach. Contrast is present within the decompressed stomach. No evidence of contrast extravasation. 2. No acute abdominal abnormality. Moderate rectal colonic stool burden with expected stool burden elsewhere throughout the colon. Electronically Signed   By: Evangeline Dakin M.D.   On: 12/09/2017 16:45     Medications:  Scheduled: . Chlorhexidine Gluconate Cloth  6 each Topical Q0600  . ciprofloxacin  500 mg Oral BID  . docusate sodium  100 mg Oral BID  . famotidine  20 mg Per Tube Daily  . feeding supplement (ENSURE ENLIVE)  1 Bottle Per Tube TID BM  . feeding supplement (PRO-STAT SUGAR FREE 64)  30 mL Per Tube BID  . free water  600 mL Per Tube Q4H  .  insulin aspart  0-9 Units Subcutaneous Q4H  . loratadine  10 mg Per Tube Daily  . metoCLOPramide  5 mg Per Tube TID AC  . metoprolol tartrate  25 mg Per Tube BID  . mupirocin ointment  1 application Nasal BID  . polyethylene glycol  17 g Per Tube BID  . potassium chloride  20 mEq Per Tube Daily  . senna-docusate  1 tablet Per Tube QHS   Continuous: . dextrose 10 mL/hr at 12/09/17 1610   RUE:[id number] **OR** acetaminophen, bisacodyl, HYDROcodone-acetaminophen, ondansetron **OR** ondansetron (ZOFRAN) IV, sodium phosphate  Assessment/Plan:  Principal Problem:   Acute lower UTI Active Problems:   Coronary artery disease due to lipid rich plaque s/p CABG 1996   Chronic atrial fibrillation (HCC)   History of subdural hematoma   Type 2 diabetes mellitus with diabetic neuropathy, without long-term current use of insulin (HCC)   Essential hypertension   Cerebrovascular accident (CVA) due to embolism of left middle cerebral artery (HCC)   Hypernatremia   AKI (acute kidney injury) ([place])   Acute encephalopathy   Subcapsular hematoma of liver   UTI (urinary tract  infection)   Abnormal transaminases   Transaminitis    Acute metabolic encephalopathy. Likely multifactorial including hypernatremia, renal failure, UTI. CT head did not show any new findings. Constipation could have also contributed.  Electrolyte abnormalities have improved significantly.  Mental status has improved.  He is likely at his baseline per his caregiver.  Would recommend palliative medicine consult at skilled nursing facility.     Urinary tract infection with Pseudomonas Urine culture positive for Pseudomonas.  Sensitivities reviewed.  Patient was initially on ceftriaxone and was changed over to ceftazidime.  Change to oral ciprofloxacin.    Acute kidney injury with hypernatremia Acute renal failure was most likely secondary to poor oral intake as well as active infection.  Renal function and sodium level improved with IV fluids.  Unfortunately sodium level noted to be high this morning.  He will be given higher amounts of free water through his tube.  Recheck labs later today and tomorrow.  Subcapsular liver hematoma with elevated LFTs. Hemodynamically stable.  LFTs remain elevated but stable.  The hematoma is small and age is uncertain.  Hepatitis panel is negative.  Monitor LFTs periodically.  Hypotension in the setting of essential hypertension Blood pressure has been noted to be low.  He has been asymptomatic.  His antihypertensive medication doses were adjusted.  He was noted to be on both metoprolol and propranolol.  Propranolol was discontinued. The dose of metoprolol was reduced.  Amlodipine was discontinued.  Blood pressure has improved.  Continue to monitor.    Chronic atrial fibrillation Rate is controlled.  He is not on anticoagulation due to history of hemorrhagic stroke, subdural hematoma. Continue metoprolol.  History of diabetes mellitus type 2. HbA1c was 6.8 in July. SSI. Holding his oral agents.  Well controlled.  Constipation. Rectal stool noted on CT scan.  He was disimpacted in the emergency department. Needs good bowel regimen on discharge.  Increased dose of MiraLAX.  Enema as needed.  History of stroke/dysphagia/pulled out PEG tube He's had a large left MCA stroke resulting in right hemiparesis. It appears that he had significant dysphagia as he still has a PEG tube. Although this is not being used for feeding purposes anymore.  Unfortunately patient pulled out his PEG tube 12/24.  He has never done this before.  Foley catheter was immediately placed.  General surgery consulted.  They mentioned  that the Foley can be used indefinitely for feeding purposes and medication administration.  Discussed this with the patient caregiver as well as his daughter.  They are okay with leaving the foley catheter in for now.  If in the future there are issues with the Foley then interventional radiology will need to replace with PEG tube.    Macrocytic anemia. Hemoglobin is stable.  Vitamin B12 level 430.  Folate 9.8.  Ferritin 849.  DVT Prophylaxis: SCDs    Code Status: DNR  Family Communication: Discussed with his caregiver. Disposition Plan: Management as outlined above.  Anticipate discharge back to skilled nursing facility tomorrow if sodium level remains stable.     LOS: 6 days   [facility] Pager [phone] 12/11/2017, 3:01 PM  If 7PM-7AM, please contact night-coverage at [email or web address], password [facility]

## 2017-12-12 DIAGNOSIS — E43 Unspecified severe protein-calorie malnutrition: Secondary | ICD-10-CM

## 2017-12-12 LAB — BASIC METABOLIC PANEL
ANION GAP: 7 (ref 5–15)
BUN: 30 mg/dL — ABNORMAL HIGH (ref 6–20)
CHLORIDE: 115 mmol/L — AB (ref 101–111)
CO2: 27 mmol/L (ref 22–32)
Calcium: 9 mg/dL (ref 8.9–10.3)
Creatinine, Ser: 1.15 mg/dL (ref 0.61–1.24)
GFR calc non Af Amer: 57 mL/min — ABNORMAL LOW (ref >60)
Glucose, Bld: 145 mg/dL — ABNORMAL HIGH (ref 65–99)
POTASSIUM: 3.6 mmol/L (ref 3.5–5.1)
Sodium: 149 mmol/L — ABNORMAL HIGH (ref 135–145)

## 2017-12-12 LAB — GLUCOSE, CAPILLARY
Glucose-Capillary: 152 mg/dL — ABNORMAL HIGH (ref 65–99)
Glucose-Capillary: 129 mg/dL — ABNORMAL HIGH (ref 65–99)
Glucose-Capillary: 189 mg/dL — ABNORMAL HIGH (ref 65–99)
Glucose-Capillary: 196 mg/dL — ABNORMAL HIGH (ref 65–99)

## 2017-12-12 MED ORDER — FREE WATER: 800.0000 mL | Freq: Four times a day (QID) | 0 refills | Status: AC

## 2017-12-12 MED ORDER — CIPROFLOXACIN HCL 500 MG PO TABS: 500.0000 mg | ORAL_TABLET | Freq: Two times a day (BID) | ORAL | Status: AC

## 2017-12-12 NOTE — Care Management Note (Signed)
Case Management Note  Patient Details  Name: Robert Keith MRN: [id number] Date of Birth: [birth year]  Subjective/Objective:                    Action/Plan: Pt returning to [facility] today. No further needs per CM.   Expected Discharge Date:  12/12/17               Expected Discharge Plan:  Skilled Nursing Facility  In-House Referral:  Clinical Social Work  Discharge planning Services     Post Acute Care Choice:    Choice offered to:     DME Arranged:    DME Agency:     HH Arranged:    [place] Agency:     Status of Service:  Completed, signed off  If discussed at [employer], dates discussed:    Additional Comments:  Pollie Friar, RN 12/12/2017, 3:50 PM

## 2017-12-12 NOTE — NC FL2 (Signed)
[place] LEVEL OF CARE SCREENING TOOL     IDENTIFICATION  Patient Name: Robert Keith Birthdate: 08-Aug-1933 Sex: male Admission Date (Current Location): 12/04/2017  [facility] and [place] Number:  Herbalist and Address:  [facility] [place], [place] [address], [place], [place] [address]      Provider Number: [id number]  Attending Physician Name and Address:  Bonnielee Haff, MD  Relative Name and Phone Number:       Current Level of Care: Hospital Recommended Level of Care: [place] Prior Approval Number:    Date Approved/Denied:   PASRR Number:    Discharge Plan: SNF    Current Diagnoses: Patient Active Problem List   Diagnosis Date Noted  . Protein-calorie malnutrition, severe 12/12/2017  . Acute lower UTI 12/05/2017  . Acute encephalopathy 12/05/2017  . Subcapsular hematoma of liver 12/05/2017  . UTI (urinary tract infection) 12/05/2017  . Abnormal transaminases 12/05/2017  . Transaminitis   . CAD (coronary artery disease), native coronary artery 11/09/2017  . Right hemiplegia ([place]) 09/19/2017  . Pleural effusion   . Leukocytosis   . Hypernatremia   . AKI (acute kidney injury) ([place])   . Acute respiratory failure ([place])   . Aphasia   . Facial droop   . Chronic subdural hematoma ([place]) 06/27/2017  . Hematoma of thigh, right, initial encounter 06/27/2017  . Cytotoxic brain edema ([place]) 06/27/2017  . Pseudoaneurysm of femoral artery following procedure ([place]) 06/27/2017  . Hemorrhagic shock ([place])   . Cerebrovascular accident (CVA) due to embolism of left middle cerebral artery ([place]) 06/26/2017  . Acute ischemic stroke ([place]) 06/26/2017  . Duodenal ulcer disease   . Acute blood loss anemia 09/17/2016  . Coronary artery disease due to lipid rich plaque s/p CABG 1996 09/17/2016  . Chronic atrial fibrillation ([place]) 09/17/2016  . History of subdural hematoma 09/17/2016  . Peptic ulcer disease 09/17/2016  . Type 2  diabetes mellitus with diabetic neuropathy, without long-term current use of insulin ([place]) 09/17/2016  . Essential hypertension 09/17/2016    Orientation RESPIRATION BLADDER Height & Weight     Self  Normal External catheter Weight: 172 lb 9.9 oz (78.3 kg) Height:  6' (182.9 cm)  BEHAVIORAL SYMPTOMS/MOOD NEUROLOGICAL BOWEL NUTRITION STATUS      Incontinent Diet(Dysphagia 3 (mechanical soft);Thin liquid)  AMBULATORY STATUS COMMUNICATION OF NEEDS Skin   Extensive Assist Verbally(incomprehensible) PU Stage and Appropriate Care(buttocks)                       Personal Care Assistance Level of Assistance  Bathing, Feeding, Dressing Bathing Assistance: Maximum assistance Feeding assistance: Maximum assistance Dressing Assistance: Maximum assistance     Functional Limitations Info  Sight, Hearing, Speech Sight Info: Adequate Hearing Info: Adequate Speech Info: Impaired(incomprehensible)    SPECIAL CARE FACTORS FREQUENCY  PT (By licensed PT), OT (By licensed OT)     PT Frequency: 5x week OT Frequency: 5x week            Contractures Contractures Info: Not present    Additional Factors Info  Code Status, Allergies Code Status Info: DNR Allergies Info: METFORMIN AND RELATED, TAPE, BUPROPION, FENOFIBRATE MICRONIZED, GEMFIBROZIL, NIACIN, PHENOBARBITAL, SIMVASTATIN           Current Medications (12/12/2017):  This is the current hospital active medication list Current Facility-Administered Medications  Medication Dose Route Frequency Provider Last Rate Last Dose  . acetaminophen (TYLENOL) tablet 650 mg  650 mg Oral Q6H  PRN Vianne Bulls, MD   650 mg at 12/11/17 2153   Or  . acetaminophen (TYLENOL) suppository 650 mg  650 mg Rectal Q6H PRN Opyd, Ilene Qua, MD      . bisacodyl (DULCOLAX) suppository 10 mg  10 mg Rectal Daily PRN Opyd, Ilene Qua, MD      . ciprofloxacin (CIPRO) tablet 500 mg  500 mg Oral BID Bonnielee Haff, MD   500 mg at 12/12/17 1022  . dextrose 5  % solution   Intravenous Continuous Bonnielee Haff, MD 10 mL/hr at 12/09/17 [id number]    . docusate sodium (COLACE) capsule 100 mg  100 mg Oral BID Bonnielee Haff, MD   100 mg at 12/11/17 1040  . famotidine (PEPCID) tablet 20 mg  20 mg Per Tube Daily Opyd, Ilene Qua, MD   20 mg at 12/12/17 1022  . feeding supplement (ENSURE ENLIVE) (ENSURE ENLIVE) liquid 237 mL  1 Bottle Per Tube TID BM Bonnielee Haff, MD   237 mL at 12/12/17 1022  . feeding supplement (PRO-STAT SUGAR FREE 64) liquid 30 mL  30 mL Per Tube BID Opyd, Ilene Qua, MD   30 mL at 12/12/17 1022  . free water 600 mL  600 mL Per Tube Q4H Bonnielee Haff, MD   600 mL at 12/12/17 1022  . HYDROcodone-acetaminophen (NORCO/VICODIN) 5-325 MG per tablet 1-2 tablet  1-2 tablet Per Tube Q4H PRN Opyd, Ilene Qua, MD      . insulin aspart (novoLOG) injection 0-9 Units  0-9 Units Subcutaneous Q4H Opyd, Ilene Qua, MD   2 Units at 12/12/17 0042  . loratadine (CLARITIN) tablet 10 mg  10 mg Per Tube Daily Opyd, Ilene Qua, MD   10 mg at 12/12/17 1022  . metoCLOPramide (REGLAN) tablet 5 mg  5 mg Per Tube TID AC Opyd, Ilene Qua, MD   5 mg at 12/12/17 1022  . metoprolol tartrate (LOPRESSOR) tablet 25 mg  25 mg Per Tube BID Bonnielee Haff, MD   25 mg at 12/12/17 1022  . ondansetron (ZOFRAN) tablet 4 mg  4 mg Oral Q6H PRN Opyd, Ilene Qua, MD       Or  . ondansetron (ZOFRAN) injection 4 mg  4 mg Intravenous Q6H PRN Opyd, Ilene Qua, MD      . polyethylene glycol (MIRALAX / GLYCOLAX) packet 17 g  17 g Per Tube BID Bonnielee Haff, MD   17 g at 12/10/17 2341  . potassium chloride 20 MEQ/15ML (10%) solution 20 mEq  20 mEq Per Tube Daily Opyd, Ilene Qua, MD   20 mEq at 12/12/17 1022  . senna-docusate (Senokot-S) tablet 1 tablet  1 tablet Per Tube QHS Vianne Bulls, MD   1 tablet at 12/11/17 2154  . sodium phosphate (FLEET) 7-19 GM/118ML enema 1 enema  1 enema Rectal Once PRN Opyd, Ilene Qua, MD         Discharge Medications: Please see discharge summary for a list  of discharge medications.  Relevant Imaging Results:  Relevant Lab Results:   Additional Information SS# [id number]  [place], [place]

## 2017-12-12 NOTE — Discharge Summary (Signed)
[facility]  Physician Discharge Summary   Patient ID: Robert Keith MRN: [id number] DOB/AGE: 1933/02/21 83 y.o.  Admit date: 12/04/2017 Discharge date: 12/12/2017  PCP: Leonel Ramsay, MD  DISCHARGE DIAGNOSES:  Principal Problem:   Acute lower UTI Active Problems:   Coronary artery disease due to lipid rich plaque s/p CABG 1996   Chronic atrial fibrillation ([place])   History of subdural hematoma   Type 2 diabetes mellitus with diabetic neuropathy, without long-term current use of insulin (HCC)   Essential hypertension   Cerebrovascular accident (CVA) due to embolism of left middle cerebral artery (HCC)   Hypernatremia   AKI (acute kidney injury) ([place])   Acute encephalopathy   Subcapsular hematoma of liver   UTI (urinary tract infection)   Abnormal transaminases   Transaminitis   Protein-calorie malnutrition, severe   RECOMMENDATIONS FOR OUTPATIENT FOLLOW UP: 1. CBC and basic metabolic panel in 5 days. 2. Free water through the feeding tube: 800 mL every 6 hours. 3. Palliative medicine consult is recommended at SNF for goals of care 4. Speech therapy to continue to work with patient both for swallowing assessment as well as for speech  DISCHARGE CONDITION: fair  Diet recommendation: Dysphagia 3 diet with thin liquids  Filed Weights   12/08/17 0500 12/10/17 0422 12/11/17 0400  Weight: 77.4 kg (170 lb 10.2 oz) 79.6 kg (175 lb 7.8 oz) 78.3 kg (172 lb 9.9 oz)    INITIAL HISTORY: 83 year old Caucasian male with a past medical history of prostate cancer status post resection, currently, artery disease status post CABG in the past, hypertension, diabetes type 2, chronic subdural hematoma, previous history of large left MCA stroke with right-sided weakness, history of intracranial hemorrhage, chronic atrial fibrillation, not on anticoagulation, presented from a skilled nursing facility due to worsening confusion, hallucinations and poor appetite. Over the past 2-3  days prior to admission. Patient has not been eating and drinking much. According to his caregiver, patient has also lost about 15 pounds in the last 2-3 weeks.  Patient was started on antibiotics.  Urine culture positive for Pseudomonas.  Mental status slowly improved.  Patient then pulled out his PEG tube.  Foley catheter was placed.  Seen by general surgery who have mentioned that it is okay to use this particular tube for feeding and medication administration indefinitely.  Consultations:  General surgery  Procedures: None   HOSPITAL COURSE:    Acute metabolic encephalopathy. Likely multifactorial including hypernatremia, renal failure, UTI. CT head did not show any new findings. Constipation could have also contributed.  Electrolyte abnormalities have improved significantly.  Mental status has improved.  He is likely at his baseline per his caregiver.  Would recommend palliative medicine consult at skilled nursing facility.     Urinary tract infection with Pseudomonas Urine culture positive for Pseudomonas.  Sensitivities reviewed.  Patient was initially on ceftriaxone and was changed over to ceftazidime.    Now on oral ciprofloxacin.    Acute kidney injury with hypernatremia Acute renal failure was most likely secondary to poor oral intake as well as active infection.  Renal function and sodium level improved with IV fluids.  Patient was also given free water through feeding tube which also has helped.  This will need to be continued at the skilled nursing facility.  Would recommend checking labs every few days.  Subcapsular liver hematoma with elevated LFTs. Hemodynamically stable.  LFTs remain elevated but stable.  The hematoma is small and age is uncertain.  Hepatitis panel is  negative.  Monitor LFTs periodically.  Hypotension in the setting of essential hypertension Blood pressure has been noted to be low.  He has been asymptomatic.  His antihypertensive medication doses were  adjusted.  He was noted to be on both metoprolol and propranolol.  Propranolol was discontinued. The dose of metoprolol was reduced.  Amlodipine was discontinued.  Blood pressure has improved.     Chronic atrial fibrillation Rate is controlled.  He is not on anticoagulation due to history of hemorrhagic stroke, subdural hematoma. Continue metoprolol.  History of diabetes mellitus type 2. HbA1c was 6.8 in July. SSI. Holding his oral agents to avoid hypoglycemia.    Constipation. Rectal stool noted on CT scan. He was disimpacted in the emergency department. Needs good bowel regimen.  Will be discharged on MiraLAX, Senokot and Colace.  Can be given enemas as needed.  History of stroke/dysphagia/pulled out PEG tube He's had a large left MCA stroke resulting in right hemiparesis. It appears that he had significant dysphagia as he still has a PEG tube. Although this is not being used for feeding purposes anymore.  Unfortunately patient pulled out his PEG tube 12/24.  He has never done this before.  Foley catheter was immediately placed.  General surgery consulted.  They mentioned that the Foley can be used indefinitely for feeding purposes and medication administration.  Discussed this with the patient caregiver as well as his daughter.  They are okay with leaving the foley catheter in for now.  If in the future there are issues with the Foley then interventional radiology will need to replace with PEG tube.    According to nursing staff there has been no issue giving him medicines nutrition through the current tube.  Continue to provide him nutrition by mouth.  He can be given additional supplements through his feeding tube.  Palliative medicine consultation is recommended.  Macrocytic anemia. Hemoglobin is stable.  Vitamin B12 level 430.  Folate 9.8.  Ferritin 849.  Patient has stable for the most part.  He can be discharged back to his skilled nursing facility today.   PERTINENT LABS:  The  results of significant diagnostics from this hospitalization (including imaging, microbiology, ancillary and laboratory) are listed below for reference.    Microbiology: Recent Results (from the past 240 hour(s))  Urine culture     Status: Abnormal   Collection Time: 12/04/17 10:59 PM  Result Value Ref Range Status   Specimen Description URINE, RANDOM  Final   Special Requests NONE  Final   Culture >=100,000 COLONIES/mL PSEUDOMONAS AERUGINOSA (A)  Final   Report Status 12/07/2017 FINAL  Final   Organism ID, Bacteria PSEUDOMONAS AERUGINOSA (A)  Final      Susceptibility   Pseudomonas aeruginosa - MIC*    CEFTAZIDIME 4 SENSITIVE Sensitive     CIPROFLOXACIN <=0.25 SENSITIVE Sensitive     GENTAMICIN <=1 SENSITIVE Sensitive     IMIPENEM 1 SENSITIVE Sensitive     PIP/TAZO 16 SENSITIVE Sensitive     CEFEPIME 2 SENSITIVE Sensitive     * >=100,000 COLONIES/mL PSEUDOMONAS AERUGINOSA  MRSA PCR Screening     Status: Abnormal   Collection Time: 12/05/17  3:07 AM  Result Value Ref Range Status   MRSA by PCR POSITIVE (A) NEGATIVE Final    Comment:        The GeneXpert MRSA Assay (FDA approved for NASAL specimens only), is one component of a comprehensive MRSA colonization surveillance program. It is not intended to diagnose MRSA infection nor  to guide or monitor treatment for MRSA infections. RESULT CALLED TO, READ BACK BY AND VERIFIED WITH: [place] RN AT 9381 12/05/17 BY A.DAVIS      Labs: Basic Metabolic Panel: Recent Labs  Lab 12/08/17 0644 12/09/17 0451 12/11/17 0501 12/11/17 1609 12/12/17 0321  NA 141 141 152* 148* 149*  K 3.3* 3.6 3.9 3.9 3.6  CL 114* 111 120* 119* 115*  CO2 19* 21* 23 25 27   GLUCOSE 140* 196* 179* 144* 145*  BUN 41* 39* 28* 28* 30*  CREATININE 1.12 1.10 1.14 1.10 1.15  CALCIUM 8.3* 8.6* 9.1 8.7* 9.0   Liver Function Tests: Recent Labs  Lab 12/06/17 0735 12/07/17 0356 12/08/17 0644  AST 306* 200* 302*  ALT 294* 240* 258*  ALKPHOS 253* 244*  245*  BILITOT 0.7 0.8 0.9  PROT 7.6 7.3 6.4*  ALBUMIN 2.5* 2.3* 2.3*   CBC: Recent Labs  Lab 12/06/17 0735 12/07/17 0356 12/08/17 0644 12/09/17 0451 12/11/17 0501  WBC 9.2 8.3 8.4 7.7 7.1  NEUTROABS 6.1  --   --   --   --   HGB 11.8* 11.6* 11.9* 11.0* 11.4*  HCT 38.6* 37.0* 37.6* 34.7* 37.1*  MCV 101.0* 98.7 97.2 96.7 100.0  PLT 243 250 197 247 244    CBG: Recent Labs  Lab 12/11/17 0746 12/11/17 1211 12/11/17 1601 12/11/17 1919 12/12/17 0754  GLUCAP 148* 162* 133* 111* 152*     IMAGING STUDIES Ct Head Wo Contrast  Result Date: 12/05/2017 CLINICAL DATA:  Altered level of consciousness. EXAM: CT HEAD WITHOUT CONTRAST TECHNIQUE: Contiguous axial images were obtained from the base of the skull through the vertex without intravenous contrast. COMPARISON:  Head CT 07/04/2017 FINDINGS: Brain: Sequela of remote left MCA distribution infarct with large area of encephalomalacia. Mild ex vacuo dilatation of the left lateral ventricle. Chronic 6 mm left frontal subdural fluid collection, unchanged in thickness, slight interval increase in density from prior exam likely related to recent IV contrast administration. No CT findings of acute ischemia. Advanced cerebral atrophy. No midline shift. Vascular: Increased density of intracranial vasculature likely secondary to recent IV contrast administration for abdominal CT. Skull: Prior left frontal craniotomy.  No fracture or focal lesion. Sinuses/Orbits: Minimal chronic mucosal thickening of right maxillary sinus. No sinus fluid levels. Bilateral cataract resection. Other: None. IMPRESSION: 1. Large remote left MCA infarct with encephalomalacia. Advanced cerebral atrophy. 2. Stable chronic left frontal subdural collection measuring 6 mm. 3. Increased density of intracranial vasculature likely secondary to IV contrast administration for abdominal CT 1-1/2 hours prior. 4. No evidence of acute abnormality. Electronically Signed   By: Jeb Levering M.D.   On: 12/05/2017 00:14   Ct Abdomen Pelvis W Contrast  Result Date: 12/04/2017 CLINICAL DATA:  Abdominal pain. History of gastrointestinal bleeding. History of prostate carcinoma EXAM: CT ABDOMEN AND PELVIS WITH CONTRAST TECHNIQUE: Multidetector CT imaging of the abdomen and pelvis was performed using the standard protocol following bolus administration of intravenous contrast. CONTRAST:  168mL ISOVUE-300 IOPAMIDOL (ISOVUE-300) INJECTION 61% COMPARISON:  June 27, 2017 FINDINGS: Lower chest: There is atelectatic change in the lung bases with small pleural effusions bilaterally. There are foci of coronary artery calcification. Hepatobiliary: No intrahepatic lesions are evident. There is a fluid collection along the subcapsular aspect of the liver posteriorly toward the right measuring 6.2 x 2.3 x 1.8 cm, an age uncertain subcapsular liver hematoma. No other perihepatic fluid evident. Gallbladder appears borderline distended without gallbladder wall thickening. There is no appreciable biliary duct dilatation.  Pancreas: There is no pancreatic mass or inflammatory focus. Spleen: No splenic lesions are evident. Adrenals/Urinary Tract: Adrenals bilaterally appear normal. There are parapelvic cysts in each kidney. There is a cyst in the periphery of the right kidney measuring 1.3 x 1.1 cm. There is no appreciable hydronephrosis on either side. There is no renal or ureteral calculus on either side. Urinary bladder wall is mildly thickened. Stomach/Bowel: The rectum is distended with stool. There is no perirectal stranding or rectal wall thickening. There is also fairly extensive stool throughout the mid distal sigmoid colon without inflammatory changes. There is no bowel obstruction. No free air or portal venous air. A gastrostomy catheter tip is positioned within the stomach. There is no abnormal fluid along the course of this catheter. Vascular/Lymphatic: There is atherosclerotic calcification throughout  the aorta and iliac arteries. There is calcification in the major mesenteric arterial vessels. There is no major mesenteric arterial obstruction. No adenopathy is appreciable abdomen or pelvis. Reproductive: Prostate is absent.  There is no pelvic mass evident. Other: Appendix appears unremarkable. No abscess or ascites evident in the abdomen or pelvis. There is postoperative change along the anterior pelvic wall. There is fat in both inguinal rings. There is a calcified structure within the left inguinal ring, a likely small calcified hematoma. No bowel extends into this area. Musculoskeletal: There is degenerative change throughout the lower thoracic and lumbar spine. No blastic or lytic bone lesions are identified. There is no intramuscular lesion. The proximal thigh muscles are somewhat atrophic on both sides. No abdominal wall lesion evident. Patient is status post median sternotomy. IMPRESSION: 1. Rectum is rather markedly distended with stool. No perirectal wall thickening or perirectal stranding evident. 2. Mild thickening in the urinary bladder wall. Suspect cystitis. No renal or ureteral calculi. No appreciable hydronephrosis. There are parapelvic cysts bilaterally. 3.  Prostate absent. 4. Age uncertain small subcapsular hematoma along the posterior rightward aspect of the liver. 5. Gallbladder mildly distended without gallbladder wall thickening. 6. Aortoiliac atherosclerosis. Calcification in major mesenteric arterial vessels. Multiple foci of coronary artery calcification noted. 7.  Gastrostomy catheter tip positioned within the stomach. 8. Left inguinal hernia containing a small calcified hematoma. No bowel extends into this hernia. Aortic Atherosclerosis (ICD10-I70.0). Electronically Signed   By: Lowella Grip III M.D.   On: 12/04/2017 22:46   Dg Abdomen Peg Tube Location  Result Date: 12/09/2017 CLINICAL DATA:  83 year old who inadvertently removed indwelling feeding gastrostomy tube. A  Foley catheter was placed into the tract and was injected with contrast to confirm gastric positioning. EXAM: ABDOMEN - 1 VIEW COMPARISON:  CT abdomen pelvis 12/04/2017 and earlier. Abdominal x-ray 06/28/2017. FINDINGS: The Foley catheter with its inflated balloon is appropriately position within the body of the stomach. The contrast material is present within the decompressed stomach. There is no evidence of contrast extravasation. Bowel gas pattern unremarkable without evidence of obstruction or significant ileus. Moderate stool burden in the rectum with expected colonic stool burden elsewhere throughout the colon. IMPRESSION: 1. Foley catheter tip appropriately position in the body of the stomach. Contrast is present within the decompressed stomach. No evidence of contrast extravasation. 2. No acute abdominal abnormality. Moderate rectal colonic stool burden with expected stool burden elsewhere throughout the colon. Electronically Signed   By: Evangeline Dakin M.D.   On: 12/09/2017 16:45   Dg Chest Portable 1 View  Result Date: 12/04/2017 CLINICAL DATA:  Abdominal pain. Poor intake. History of stroke and GI bleed. EXAM: PORTABLE CHEST 1 VIEW COMPARISON:  Chest radiograph July 15, 2017 FINDINGS: Cardiac silhouette is mildly enlarged. Status post median sternotomy for CABG. Mildly calcified aortic knob. Improved aeration LEFT lung base without pleural effusion for focal consolidation. No pneumothorax. Osteopenia. Surgical clips project in LEFT abdomen. IMPRESSION: Mild cardiomegaly, status post CABG.  No acute pulmonary process. Electronically Signed   By: Elon Alas M.D.   On: 12/04/2017 21:43    DISCHARGE EXAMINATION: Vitals:   12/11/17 2055 12/12/17 0105 12/12/17 0400 12/12/17 1002  BP: (!) 104/50 (!) 93/45 104/85 (!) 127/58  Pulse: (!) 103 77 97 86  Resp: 20 18 18 18   Temp: 98.9 F (37.2 C) 98.4 F (36.9 C) 98.7 F (37.1 C) 98.6 F (37 C)  TempSrc: Axillary Oral Axillary Axillary    SpO2: 98% 99% 99% 96%  Weight:      Height:       General appearance: alert, distracted and no distress Resp: clear to auscultation bilaterally Cardio: regular rate and rhythm, S1, S2 normal, no murmur, click, rub or gallop GI: soft, non-tender; bowel sounds normal; no masses,  no organomegaly.  Foley catheter noted in the abdominal stoma.  DISPOSITION: Skilled nursing facility  Discharge Instructions    Call MD for:  extreme fatigue   Complete by:  As directed    Call MD for:  persistant dizziness or light-headedness   Complete by:  As directed    Call MD for:  persistant nausea and vomiting   Complete by:  As directed    Call MD for:  severe uncontrolled pain   Complete by:  As directed    Call MD for:  temperature >100.4   Complete by:  As directed    Discharge instructions   Complete by:  As directed    Please review instructions on the discharge summary.  You were cared for by a hospitalist during your hospital stay. If you have any questions about your discharge medications or the care you received while you were in the hospital after you are discharged, you can call the unit and asked to speak with the hospitalist on call if the hospitalist that took care of you is not available. Once you are discharged, your primary care physician will handle any further medical issues. Please note that NO REFILLS for any discharge medications will be authorized once you are discharged, as it is imperative that you return to your primary care physician (or establish a relationship with a primary care physician if you do not have one) for your aftercare needs so that they can reassess your need for medications and monitor your lab values. If you do not have a primary care physician, you can call [phone] for a physician referral.   Increase activity slowly   Complete by:  As directed         Allergies as of 12/12/2017      Reactions   Metformin And Related Diarrhea   Tape Other (See  Comments)   PLEASE USE COBAN WRAP; PATIENT'S SKIN IS THIN AND WILL TEAR EASILY!!   Bupropion Other (See Comments)   Unknown   Fenofibrate Micronized Other (See Comments)   Unknown   Gemfibrozil Other (See Comments)   Unknown   Niacin Other (See Comments)   Impotence   Phenobarbital Other (See Comments)   Confusion   Simvastatin Other (See Comments)   Myalgia      Medication List    STOP taking these medications   amLODipine 5 MG tablet Commonly known as:  NORVASC  glipiZIDE 10 MG tablet Commonly known as:  GLUCOTROL   pioglitazone 15 MG tablet Commonly known as:  ACTOS   propranolol 40 MG tablet Commonly known as:  INDERAL     TAKE these medications   acetaminophen 500 MG tablet Commonly known as:  TYLENOL Take 500 mg by mouth every 8 (eight) hours as needed.   ASPERCREME LIDOCAINE EX Apply 1 application topically 3 (three) times daily. To Right hand   aspirin 325 MG tablet Place 1 tablet (325 mg total) into feeding tube daily.   bisacodyl 10 MG suppository Commonly known as:  DULCOLAX Place 1 suppository (10 mg total) rectally daily as needed for moderate constipation.   ciprofloxacin 500 MG tablet Commonly known as:  CIPRO Take 1 tablet (500 mg total) by mouth 2 (two) times daily for 5 days. For 5 more days   Cranberry 450 MG Tabs Take 450 mg by mouth every morning.   docusate sodium 100 MG capsule Commonly known as:  COLACE Take 1 capsule (100 mg total) by mouth 2 (two) times daily.   famotidine 40 MG tablet Commonly known as:  PEPCID Place 1 tablet (40 mg total) into feeding tube 2 (two) times daily before a meal.   feeding supplement (BOOST BREEZE) Liqd 240 mLs by PEG Tube route every 4 (four) hours. 60 ML free water flush before and 60 ML after each Bolus   feeding supplement (PRO-STAT SUGAR FREE 64) Liqd Place 30 mLs into feeding tube 2 (two) times daily.   free water Soln Place 800 mLs into feeding tube every 6 (six) hours. What changed:   how much to take   KLOR-CON M20 20 MEQ tablet Generic drug:  potassium chloride SA TAKE 1 TABLET (20 MEQ TOTAL) BY MOUTH DAILY   loratadine 10 MG tablet Commonly known as:  CLARITIN Place 1 tablet (10 mg total) into feeding tube daily.   metoCLOPramide 5 MG tablet Commonly known as:  REGLAN Place 5 mg into feeding tube 2 (two) times daily.   metoprolol tartrate 100 MG tablet Commonly known as:  LOPRESSOR Place 0.5 tablets (50 mg total) into feeding tube 2 (two) times daily.   mirtazapine 15 MG tablet Commonly known as:  REMERON Take 15 mg by mouth at bedtime.   NON FORMULARY Take 240 mLs by mouth 3 (three) times daily. If meal intake is less than 50% give 286ml of MED PASS by mouth and document meal intake   nystatin 100000 UNIT/ML suspension Commonly known as:  MYCOSTATIN Use as directed 5 mLs (500,000 Units total) in the mouth or throat 4 (four) times daily. What changed:    how much to take  when to take this  additional instructions   omeprazole 40 MG capsule Commonly known as:  PRILOSEC Take 40 mg by mouth daily.   polyethylene glycol packet Commonly known as:  MIRALAX / GLYCOLAX Place 17 g into feeding tube 2 (two) times daily.   pravastatin 40 MG tablet Commonly known as:  PRAVACHOL Place 1 tablet (40 mg total) into feeding tube at bedtime.   senna-docusate 8.6-50 MG tablet Commonly known as:  Senokot-S Place 1 tablet into feeding tube at bedtime as needed for mild constipation.   sodium phosphate 7-19 GM/118ML Enem Place 133 mLs (1 enema total) rectally once as needed for severe constipation.         Contact information for follow-up providers    Leonel Ramsay, MD. Schedule an appointment as soon as possible for a visit in  1 week(s).   Specialty:  Infectious Diseases Contact information: [place] [address] [phone]            Contact information for after-discharge care    Destination    [facility] .   Service:  Skilled Nursing Contact information: [place] [phone]                  TOTAL DISCHARGE TIME: 60 mins  Bonnielee Haff  [facility] Pager [phone]  12/12/2017, 11:35 AM

## 2017-12-12 NOTE — Social Work (Signed)
Clinical Social Worker facilitated patient discharge including contacting patient family and facility to confirm patient discharge plans.  Clinical information faxed to facility and family agreeable with plan.  CSW arranged ambulance transport via [facility] to [place] RN to call [phone] with report prior to discharge.  Pt friend Venida Jarvis is aware and will meet pt at [facility] this evening. Her # is [phone]  Clinical Social Worker will sign off for now as social work intervention is no longer needed. Please consult [place] again if new need arises.  Alexander Mt, [place] Social Worker

## 2017-12-12 NOTE — Progress Notes (Addendum)
[facility] just arrived and picked pt up for transport to [place], pt returning pt to facility. Will attempt to call report to give updates on pt.  Remaining belongings sent with pt/[facility].

## 2017-12-12 NOTE — Progress Notes (Signed)
At this time, attempted to call [facility] at number [phone] provided by SW. Recording reached stated this number in not in service. Attempted several times without success.

## 2017-12-12 NOTE — Discharge Instructions (Signed)
Urinary Tract Infection, Adult °A urinary tract infection (UTI) is an infection of any part of the urinary tract. The urinary tract includes the: °· Kidneys. °· Ureters. °· Bladder. °· Urethra. ° °These organs make, store, and get rid of pee (urine) in the body. °Follow these instructions at home: °· Take over-the-counter and prescription medicines only as told by your doctor. °· If you were prescribed an antibiotic medicine, take it as told by your doctor. Do not stop taking the antibiotic even if you start to feel better. °· Avoid the following drinks: °? Alcohol. °? Caffeine. °? Tea. °? Carbonated drinks. °· Drink enough fluid to keep your pee clear or pale yellow. °· Keep all follow-up visits as told by your doctor. This is important. °· Make sure to: °? Empty your bladder often and completely. Do not to hold pee for long periods of time. °? Empty your bladder before and after sex. °? Wipe from front to back after a bowel movement if you are male. Use each tissue one time when you wipe. °Contact a doctor if: °· You have back pain. °· You have a fever. °· You feel sick to your stomach (nauseous). °· You throw up (vomit). °· Your symptoms do not get better after 3 days. °· Your symptoms go away and then come back. °Get help right away if: °· You have very bad back pain. °· You have very bad lower belly (abdominal) pain. °· You are throwing up and cannot keep down any medicines or water. °This information is not intended to replace advice given to you by your health care provider. Make sure you discuss any questions you have with your health care provider. °Document Released: 05/21/2008 Document Revised: 05/10/2016 Document Reviewed: 10/24/2015 °Elsevier Interactive Patient Education © 2018 [employer] ° °

## 2017-12-21 NOTE — Progress Notes (Signed)
Cardiology Office Note  Date:  12/23/2017   ID:  Robert Keith, DOB [birth year], MRN [id number]  PCP:  Leonel Ramsay, MD   Chief Complaint  Patient presents with  . other    OD 6 month f/u no complaints today. Meds reviewed verbally with pt.    HPI:  [name] Is a very pleasant 84 year old gentleman with PUD,  NIDDM,  HTN,  CAD s/p CABG in 1996   hx of SDH while on aspirin, prostate cancer 11 year ,    admission to the hospital with upper GI bleed and acute blood loss anemia October 2017, Previously evaluated for watchman device, was felt to be a high risk candidate  who presents for follow-up of his atrial fibrillation, recent stroke  Stroke 06/2017 Right side hemiparesis Cerebrovascular accident (CVA) due to embolism of left middle cerebral artery ([place]) Essentially wheelchair bound using a Hoyer lift  UTI/sepsis 11/2017 Urine culture positive for Pseudomonas.  Patient then pulled out his PEG tube. Foley catheter was placed D/c from the hospital  12/12/2017  Lives at [facility], [place] Daughter presents with him today Lost significant weight since the stroke, especially over the past few weeks in the setting of infection  Weight at nursing home 165.2 BP down today.  He denies any orthostasis symptoms as he is not standing  He is minimally communicative but able to respond when he wants to Daughter provides most of the history  EKG personally reviewed by myself on todays visit Shows atrial fibrillation with ventricular rate 94 bpm right bundle branch block, PVCs  Other past medical history reviewed [facility]12/2016: stress test: Showing atrial fibrillation  History of subdural hematoma some time  before 2000 per the patient, requiring surgery  "drilled into brain to relieve pressure" At the time he was taking aspirin/Excedrin on a daily basis for migraine symptoms  Previously followed by Neurology Dr. 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 without significant mass effect. Advanced cortical atrophy with prominent midbrain volume loss.  Early 09/2016 he developed abdominal pain and GI bleed.   diagnosed with duodenal stenosis.   had a ct and EGD  treated with PPI   Echo v10/3/17 - Akinesis of the distal inferior wall with overall preserved LV systolic function; elevated LV filling pressure; calcified aortic valve with very mild AS; mild AI; mild MR; moderate LAE; moderately reduced RV function; mild TR with moderately elevated pulmonary pressure.   PMH:   has a past medical history of Anemia, Cancer ([place]), Chronic atrial fibrillation ([place]), Coronary artery disease, Diabetes mellitus without complication ([place]), Essential tremor, GI bleed (09/2016), H. pylori infection, H/O craniotomy, Hypertension, Intracranial hemorrhage ([place]), Prostate cancer ([place]), RBBB, SDH (subdural hematoma) ([place]), and Stroke ([place]).  PSH:    Past Surgical History:  Procedure Laterality Date  . brain surgery for bleed    . cardiac bypass    . CORONARY ARTERY BYPASS GRAFT    . ESOPHAGOGASTRODUODENOSCOPY N/A 07/12/2017   Procedure: ESOPHAGOGASTRODUODENOSCOPY (EGD);  Surgeon: [name], MD;  Location: [facility];  Service: General;  Laterality: N/A;  . ESOPHAGOGASTRODUODENOSCOPY (EGD) WITH PROPOFOL N/A 09/18/2016   Procedure: ESOPHAGOGASTRODUODENOSCOPY (EGD) WITH PROPOFOL;  Surgeon: [name], MD;  Location:  [facility];  Service: Endoscopy;  Laterality: N/A;  . FEMORAL ARTERY EXPLORATION Right 06/27/2017   Procedure: FEMORAL ARTERY EXPLORATION;  Surgeon: [name], MD;  Location: [facility];  Service: Vascular;  Laterality: Right;  . IR PERCUTANEOUS ART  THROMBECTOMY/INFUSION INTRACRANIAL INC DIAG ANGIO  06/26/2017  . PEG PLACEMENT N/A 07/12/2017   Procedure: PERCUTANEOUS ENDOSCOPIC GASTROSTOMY (PEG) PLACEMENT;  Surgeon: [name], MD;  Location: [facility];  Service: General;  Laterality: N/A;  . RADIOLOGY WITH ANESTHESIA N/A 06/26/2017   Procedure: RADIOLOGY WITH ANESTHESIA CODE STROKE;  Surgeon: [name], MD;  Location: [facility];  Service: Radiology;  Laterality: N/A;    Current Outpatient Medications  Medication Sig Dispense Refill  . acetaminophen (TYLENOL) 500 MG tablet Take 500 mg by mouth every 8 (eight) hours as needed.    . Amino Acids-Protein Hydrolys (FEEDING SUPPLEMENT, PRO-STAT SUGAR FREE 64,) LIQD Place 30 mLs into feeding tube 2 (two) times daily. 900 mL 0  . ASPERCREME LIDOCAINE EX Apply 1 application topically 3 (three) times daily. To Right hand    . aspirin EC 81 MG tablet Take 81 mg by mouth daily.    . bisacodyl (DULCOLAX) 10 MG suppository Place 1 suppository (10 mg total) rectally daily as needed for moderate constipation. 12 suppository 0  . Cranberry 450 MG TABS Take 450 mg by mouth every morning.    . docusate sodium (COLACE) 100 MG capsule Take 1 capsule (100 mg total) by mouth 2 (two) times daily. 10 capsule 0  . famotidine (PEPCID) 40 MG tablet Place 1 tablet (40 mg total) into feeding tube 2 (two) times daily before a meal. 30 tablet 1  . glipiZIDE (GLUCOTROL) 10 MG tablet Take 10 mg by mouth 2 (two) times daily before a meal.     . KLOR-CON M20 20 MEQ tablet TAKE 1 TABLET (20 MEQ TOTAL) BY MOUTH DAILY  3  . loratadine (CLARITIN) 10 MG tablet Place 1 tablet (10 mg total) into feeding tube daily. 30 tablet 0  . metoCLOPramide (REGLAN) 5 MG tablet Place 5 mg into feeding tube 2 (two) times daily.     . metoprolol tartrate (LOPRESSOR) 100 MG tablet Place 0.5 tablets (50 mg total) into feeding tube 2 (two) times daily.    . mirtazapine (REMERON) 15 MG tablet Take 15 mg by mouth at bedtime.    . Nutritional  Supplements (FEEDING SUPPLEMENT, BOOST BREEZE,) LIQD 240 mLs by PEG Tube route every 4 (four) hours. 60 ML free water flush before and 60 ML after each Bolus    . omeprazole (PRILOSEC) 40 MG capsule Take 40 mg by mouth daily.    . pioglitazone (ACTOS) 15 MG tablet Take 15 mg by mouth daily.     . polyethylene glycol (MIRALAX / GLYCOLAX) packet Place 17 g into feeding tube 2 (two) times daily. 14 each 0  . pravastatin (PRAVACHOL) 40 MG tablet Place 1 tablet (40 mg total) into feeding tube at bedtime. 30 tablet 0  . senna-docusate (SENOKOT-S) 8.6-50 MG tablet Place 1 tablet into feeding tube at bedtime as needed for mild constipation. 30 tablet 0  . sertraline (ZOLOFT) 25 MG tablet Take 25 mg by mouth daily.    . sodium phosphate (FLEET) 7-19 GM/118ML ENEM Place 133 mLs (1 enema total) rectally once as needed for severe constipation.  0  . Water For Irrigation, Sterile (FREE WATER) SOLN Place 800 mLs into feeding tube every 6 (six) hours. 1000 mL 0   No current facility-administered medications  for this visit.      Allergies:   Metformin and related; Tape; Bupropion; Fenofibrate micronized; Gemfibrozil; Niacin; Phenobarbital; and Simvastatin   Social History:  The patient  reports that he has quit smoking. he has never used smokeless tobacco. He reports that he does not drink alcohol or use drugs.   Family History:   family history includes Heart attack in his father; Stroke in his brother and mother.    Review of Systems: Review of Systems  Constitutional: Negative.   Respiratory: Positive for shortness of breath.   Cardiovascular: Positive for leg swelling.  Gastrointestinal: Negative.   Musculoskeletal: Negative.        Unsteady gait  Neurological: Negative.   Psychiatric/Behavioral: Negative.   All other systems reviewed and are negative.    PHYSICAL EXAM: VS:  BP 98/64 (BP Location: Left Arm, Patient Position: Sitting, Cuff Size: Normal)   Pulse 90   Ht 5\' 9"  (1.753 m)   Wt  162 lb 6 oz (73.7 kg)   BMI 23.98 kg/m  , BMI Body mass index is 23.98 kg/m. GEN: Presenting in a wheelchair , in no acute distress  , sling in place right arm HEENT: Right facial droop Neck: no JVD, carotid bruits, or masses Cardiac. Irregularly irregular,no murmurs, rubs, or gallops,no edema  Respiratory:  clear to auscultation bilaterally, normal work of breathing GI: soft, nontender, nondistended, + BS MS: no deformity, he does have muscle atrophy arms and legs Skin: warm and dry, no rash Neuro:  Strength and sensation are intact Psych: Alert, minimally communicative but able to talk and answer questions    Recent Labs: 07/05/2017: Magnesium 2.8 12/08/2017: ALT 258 12/11/2017: Hemoglobin 11.4; Platelets 244 12/12/2017: BUN 30; Creatinine, Ser 1.15; Potassium 3.6; Sodium 149    Lipid Panel Lab Results  Component Value Date   CHOL 115 06/27/2017   HDL 20 (L) 06/27/2017   LDLCALC 78 06/27/2017   TRIG 86 06/27/2017      Wt Readings from Last 3 Encounters:  12/23/17 162 lb 6 oz (73.7 kg)  12/11/17 172 lb 9.9 oz (78.3 kg)  09/27/17 173 lb (78.5 kg)       ASSESSMENT AND PLAN:  Coronary artery disease due to lipid rich plaque s/p CABG 1996 - Plan: EKG 12-Lead Currently with no symptoms of angina. No further workup at this time. Continue current medication regimen.  Stable  Essential hypertension - Plan: EKG 12-Lead Blood pressure running low Blood pressure continues to run low Asymptomatic as he is essentially wheelchair-bound We will continue current dose of metoprolol 50 twice daily  History of subdural hematoma Residual blood seen on MRI scan 2016 Stroke July 2018  not a candidate for warfarin or NOAC given prior history of GI bleeding and subdural hematoma  Chronic atrial fibrillation ([place]) First noted 2016, asymptomatic On metoprolol for rate control Not a candidate for watchman  Type 2 diabetes mellitus with diabetic neuropathy, without long-term  current use of insulin ([place]) Managed by primary care Eating less, dramatic weight loss  GI bleed GI bleed early October 2007, duodenal stricture  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    Signed, Esmond Plants, M.D., Ph.D. 12/23/2017  [place]  [place], [place]

## 2017-12-23 ENCOUNTER — Encounter: Payer: Self-pay | Admitting: Cardiovascular Disease

## 2017-12-23 ENCOUNTER — Ambulatory Visit (INDEPENDENT_AMBULATORY_CARE_PROVIDER_SITE_OTHER): Payer: Medicare Other | Admitting: Cardiovascular Disease

## 2017-12-23 ENCOUNTER — Telehealth: Payer: Self-pay | Admitting: Neurology

## 2017-12-23 VITALS — BP 98/64 | HR 90 | Ht 69.0 in | Wt 162.4 lb

## 2017-12-23 DIAGNOSIS — I25118 Atherosclerotic heart disease of native coronary artery with other forms of angina pectoris: Secondary | ICD-10-CM | POA: Diagnosis not present

## 2017-12-23 DIAGNOSIS — I6203 Nontraumatic chronic subdural hemorrhage: Secondary | ICD-10-CM | POA: Diagnosis not present

## 2017-12-23 DIAGNOSIS — I63412 Cerebral infarction due to embolism of left middle cerebral artery: Secondary | ICD-10-CM | POA: Diagnosis not present

## 2017-12-23 DIAGNOSIS — I482 Chronic atrial fibrillation, unspecified: Secondary | ICD-10-CM

## 2017-12-23 DIAGNOSIS — E43 Unspecified severe protein-calorie malnutrition: Secondary | ICD-10-CM | POA: Diagnosis not present

## 2017-12-23 DIAGNOSIS — E1140 Type 2 diabetes mellitus with diabetic neuropathy, unspecified: Secondary | ICD-10-CM | POA: Diagnosis not present

## 2017-12-23 NOTE — Patient Instructions (Addendum)

## 2017-12-23 NOTE — Telephone Encounter (Signed)
Pt'sPOA-Sherry called said he has an appt with the infectious disease Dr today. She said he is not eating or drinking, said he is not doing good. She is was wanting to c/a the appt on Wednesday, she said they were not aware of that appt and thought Dr Erlinda Hong did not want to see him again so he has been seeing his other neurologist. She kept going back and forth to c/a the appt then keep it. She finally decided to keep it and said if she feels after the appt today with infectious disease dr it needs to be c/a she will call back   [facility]

## 2017-12-25 ENCOUNTER — Ambulatory Visit: Payer: Medicare Other | Admitting: Neurology

## 2018-02-14 DEATH — deceased

## 2018-10-28 IMAGING — DX DG CHEST 1V PORT
1 series · 1 of 1 positions shown · non-contrast
Comparison: None.

CLINICAL DATA: ET tube placement

EXAM:
PORTABLE CHEST 1 VIEW

[chest ap]
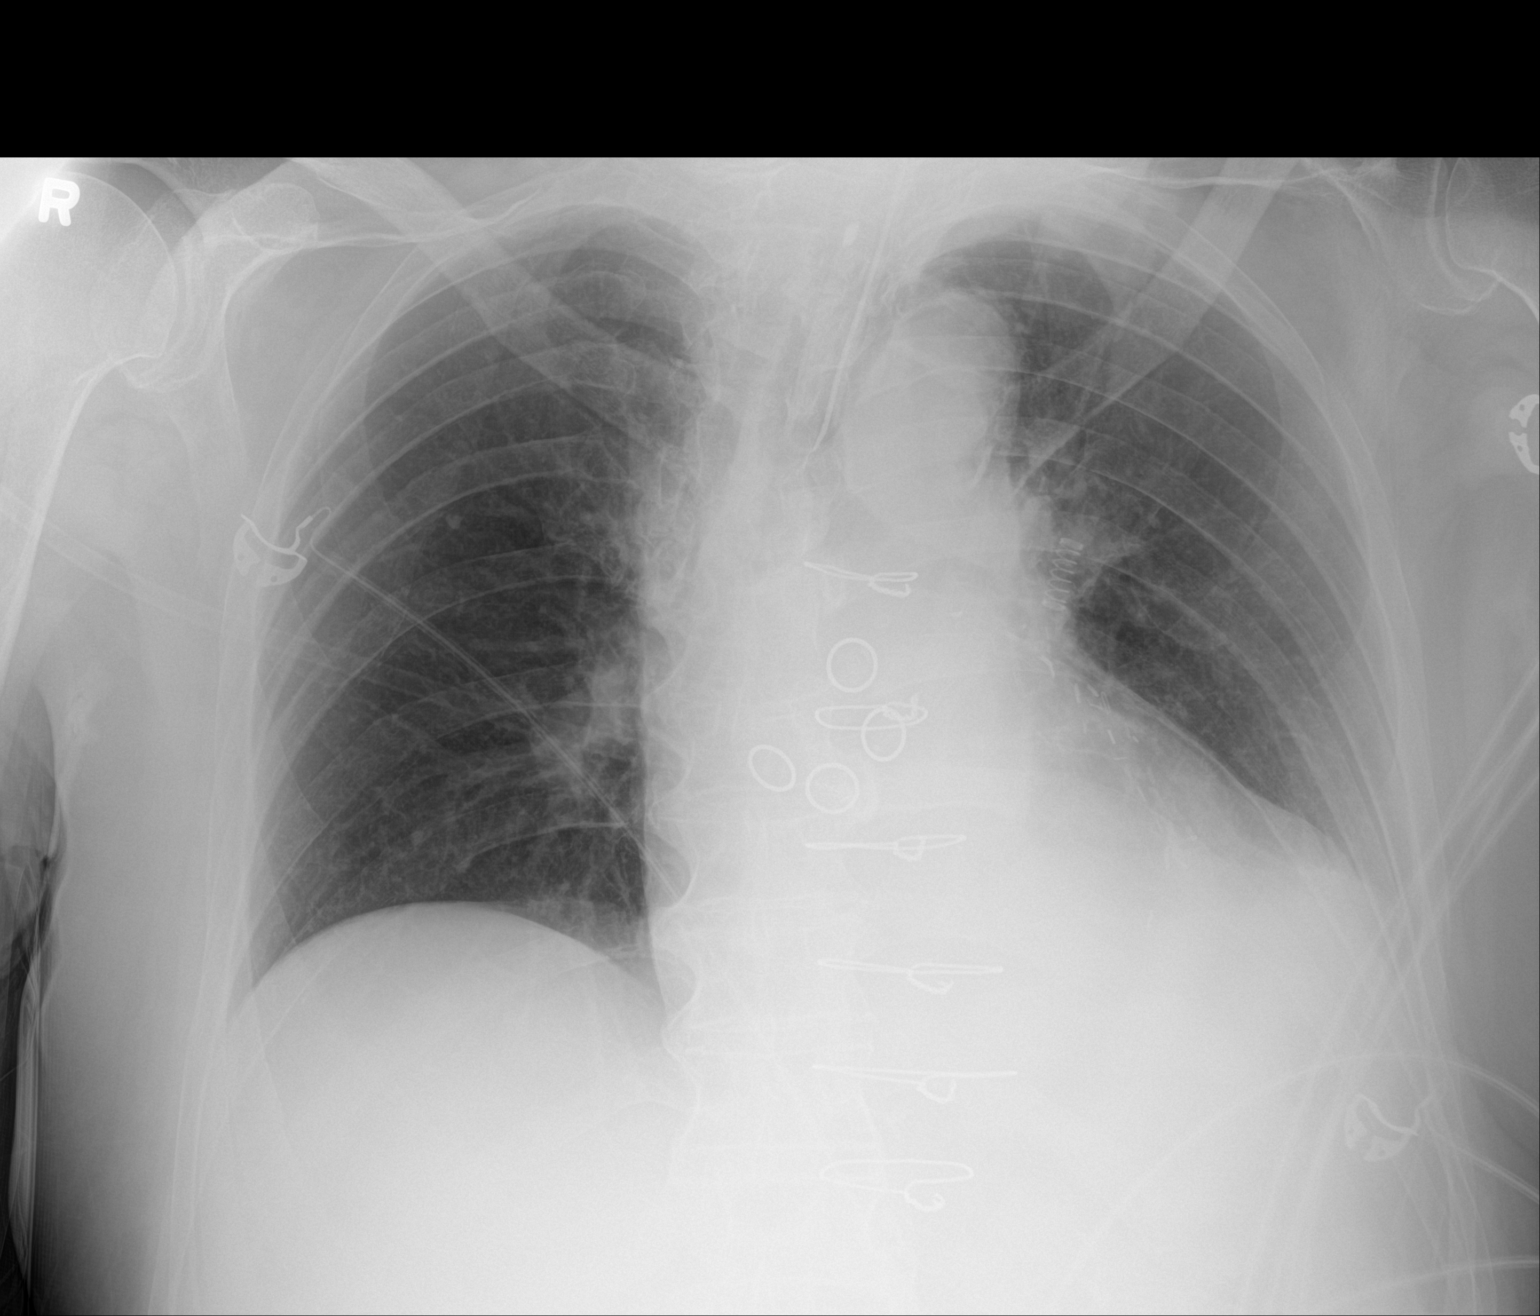

[1 of 1 positions shown; findings below may reference images not displayed]

FINDINGS: Endotracheal tube is 3 cm above the carina. Prior CABG.
Cardiomegaly. Left lower lobe atelectasis or infiltrate. Right lung
is clear. No effusions. No acute bony abnormality.
IMPRESSION: Endotracheal tube 3 cm above the carina.

Left lower lobe atelectasis or infiltrate.

## 2018-10-29 IMAGING — MR MR HEAD W/O CM
9 of 10 series · 35 of 48 positions shown · non-contrast
Comparison: CT examinations done yesterday.

CLINICAL DATA: Acute onset of right facial droop and right-sided
weakness beginning yesterday. Left middle cerebral artery occlusion.

EXAM:
MRI HEAD WITHOUT CONTRAST
TECHNIQUE: Multiplanar, multiecho pulse sequences of the brain and surrounding
structures were obtained without intravenous contrast.

[Series 3: DWI · axial · 3.0mm · 1.09mm/px · z∈[+18,+164]mm · 8 of 102 slices shown (1 of 4)]
[im 1/102]
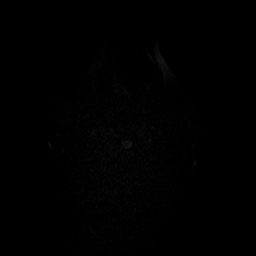
[im 12/102]
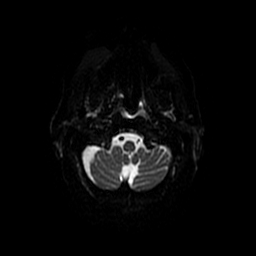
[im 34/102]
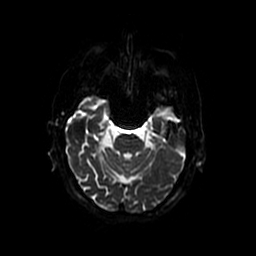
[im 45/102]
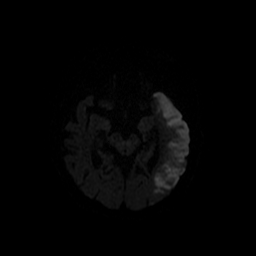
[im 57/102]
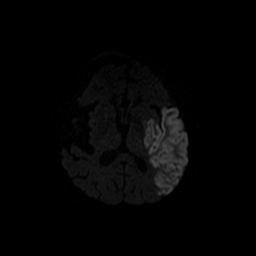
[im 68/102]
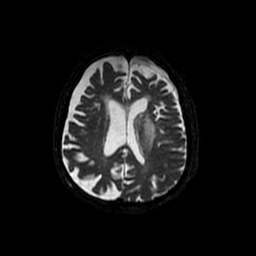
[im 90/102]
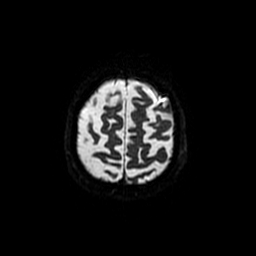
[im 102/102]
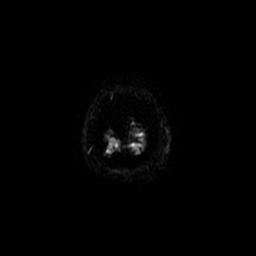

[Series 4: T2 · axial · 5.0mm · 0.43mm/px · z∈[+12,+169]mm · 3 of 28 slices shown (1 of 2)]
[im 1/28]
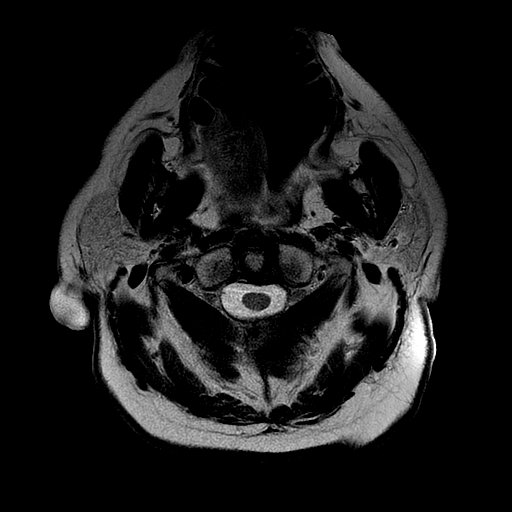
[im 14/28]
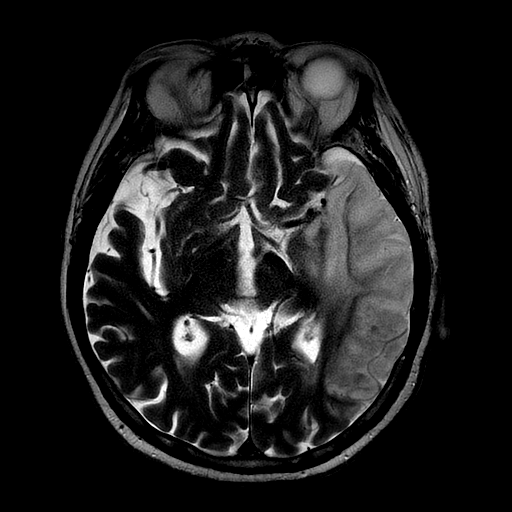
[im 28/28]
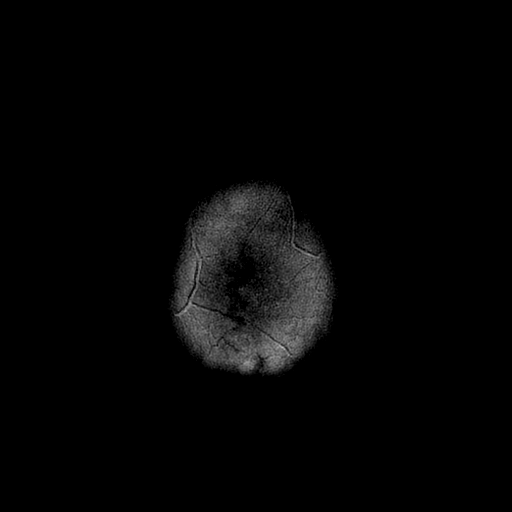

[Series 5: FLAIR · axial · 5.0mm · 0.43mm/px · z∈[+12,+169]mm · 3 of 28 slices shown]
[im 1/28]
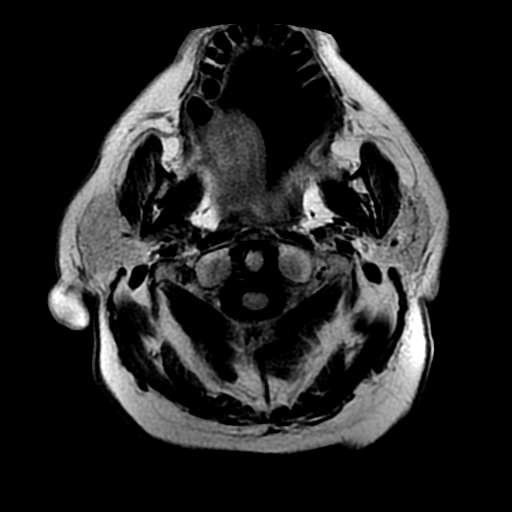
[im 14/28]
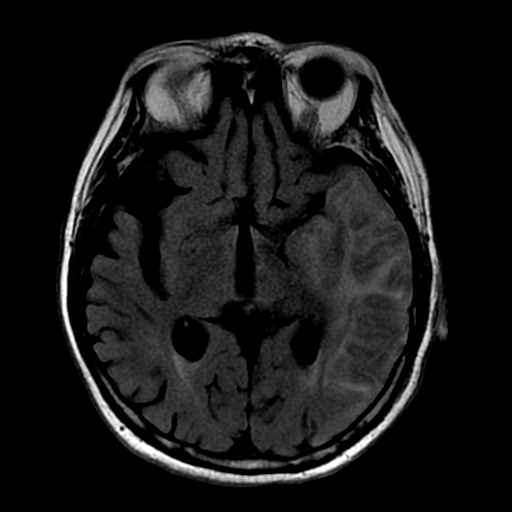
[im 28/28]
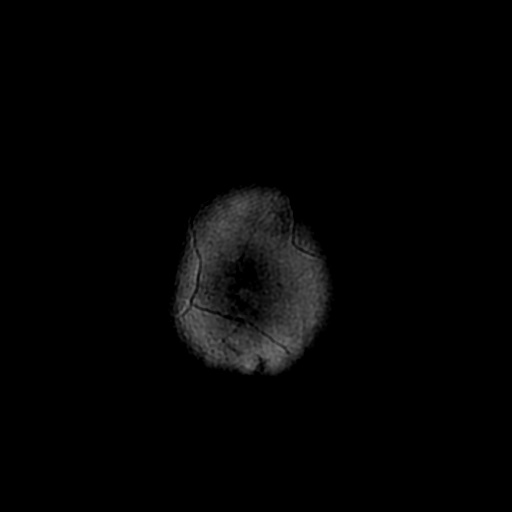

[Series 6: T2 · coronal · 5.0mm · 0.43mm/px · 3 of 27 slices shown (2 of 2)]
[im 1/27]
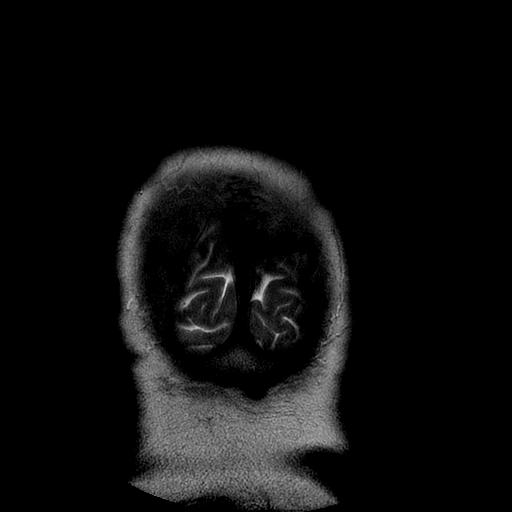
[im 14/27]
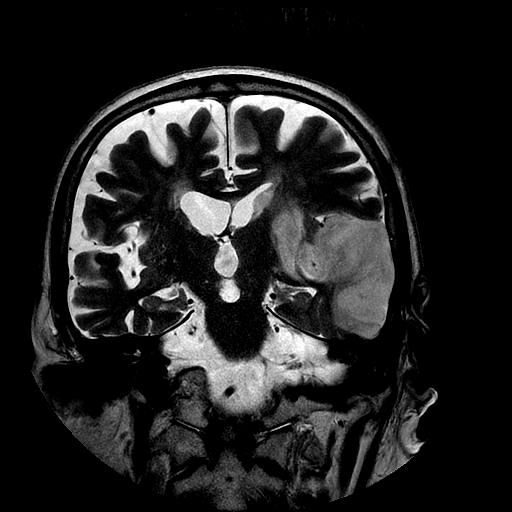
[im 27/27]
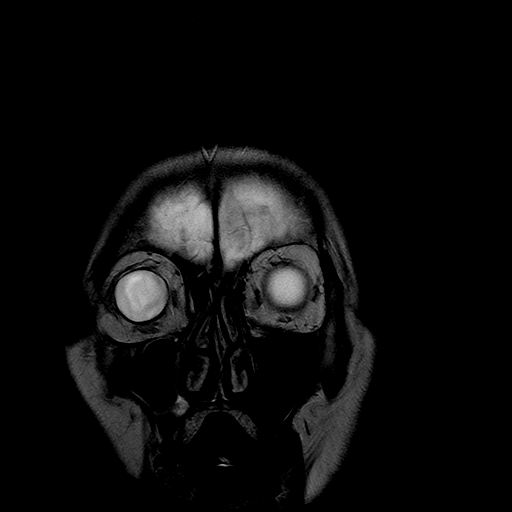

[Series 7: T1 · sagittal · 5.0mm · 0.47mm/px · 2 of 23 slices shown]
[im 1/23]
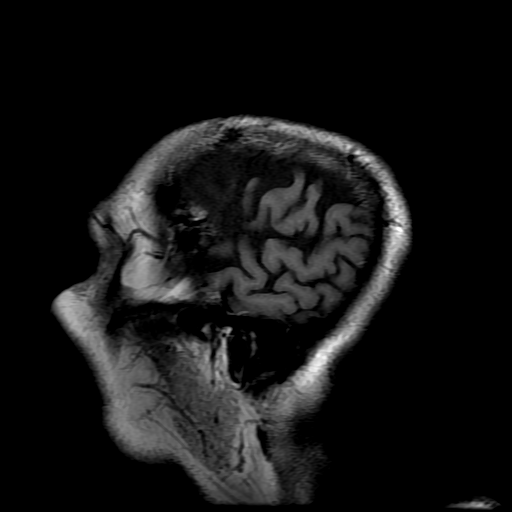
[im 23/23]
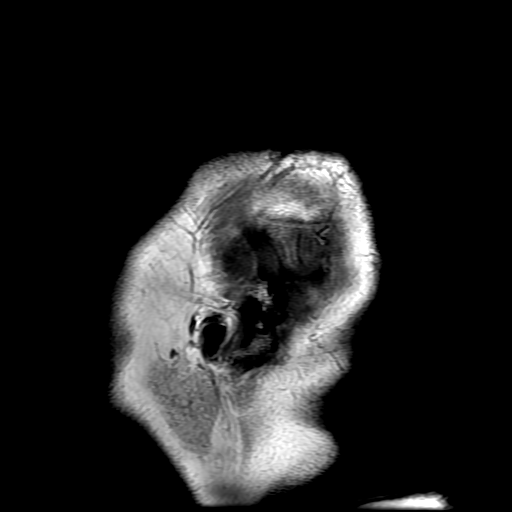

[Series 8: DWI · coronal · 5.0mm · 1.09mm/px · 7 of 66 slices shown (2 of 4)]
[im 1/66]
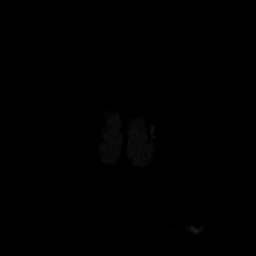
[im 11/66]
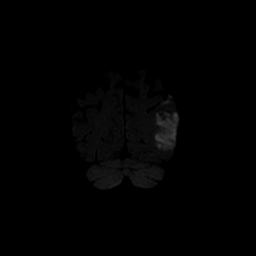
[im 22/66]
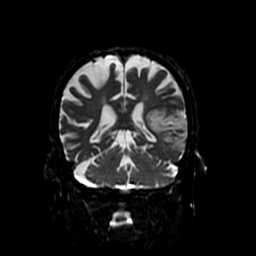
[im 33/66]
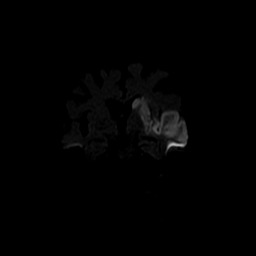
[im 44/66]
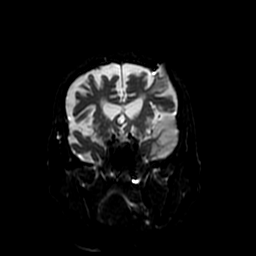
[im 55/66]
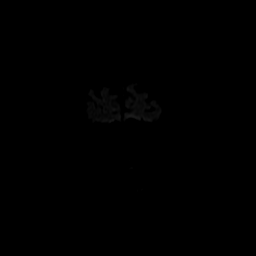
[im 66/66]
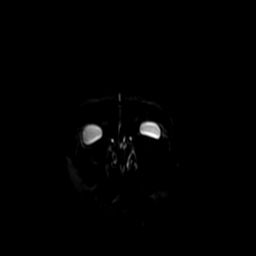

[Series 9: ax mpgr · axial · 5.0mm · 0.47mm/px · 1 of 23 slices shown]
[im 1/23]
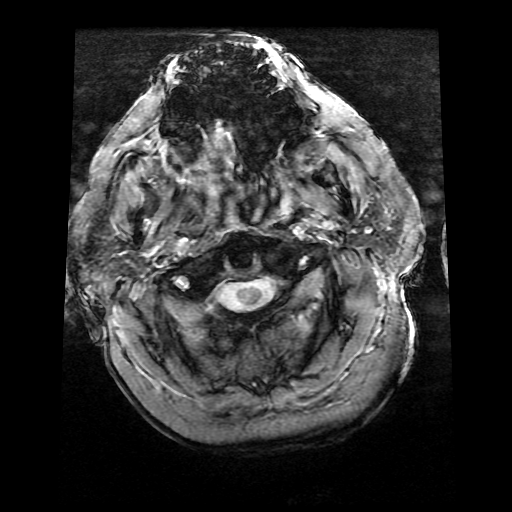

[Series 300: DWI · axial · 3.0mm · 1.09mm/px · z∈[+18,+164]mm · 5 of 51 slices shown (3 of 4)]
[im 1/51]
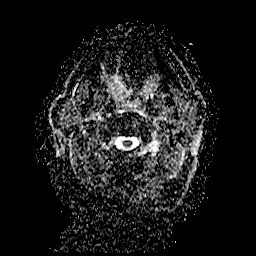
[im 13/51]
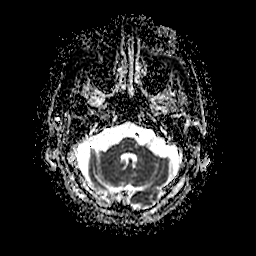
[im 26/51]
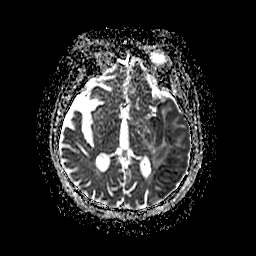
[im 38/51]
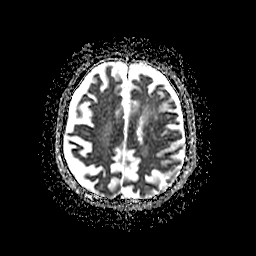
[im 51/51]
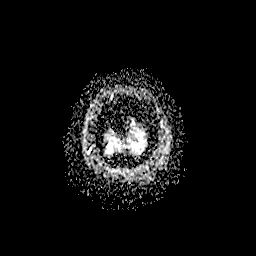

[Series 800: DWI · coronal · 5.0mm · 1.09mm/px · 3 of 33 slices shown (4 of 4)]
[im 1/33]
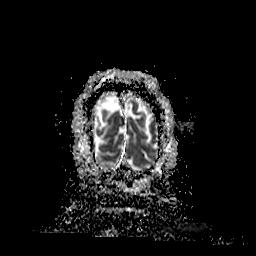
[im 17/33]
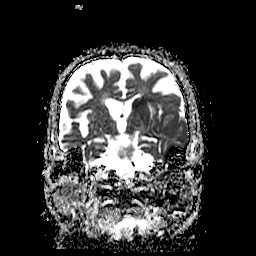
[im 33/33]
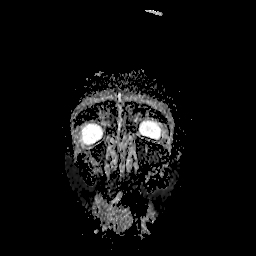

[35 of 48 positions shown; findings below may reference images not displayed]

FINDINGS: Brain: There is acute infarction affecting the inferior division MCA
territory affecting the left temporal lobe, posterior frontal
operculum left basal ganglia an with minimal patchy involvement in
the left parietal region. Swelling but no hematoma or shift. Small
areas of petechial blood product deposition. No hydrocephalus. No
extra-axial collection. Chronic small-vessel ischemic changes of the
cerebral hemispheric white matter elsewhere. Few old small vessel
cerebellar infarctions.

Vascular: Major vessels at the base of the brain show flow.

Skull and upper cervical spine: Negative

Sinuses/Orbits: Clear/normal

Other: None significant
IMPRESSION: Completed acute infarction affecting the majority of the inferior
division left MCA distribution. This affects the left temporal lobe
and posterior frontal lobe with patchy involvement in the parietal
region. Infarction of the caudate and putamen as well. Petechial
blood product deposition without focal hematoma. Swelling but no
shift at this time.
